# Patient Record
Sex: Female | Born: 1935 | Race: White | Hispanic: No | Smoking: Former smoker
Health system: Southern US, Community
[De-identification: ages and names within clinical notes are randomized; demographics above are authoritative.]

## PROBLEM LIST (undated history)

## (undated) DIAGNOSIS — E049 Nontoxic goiter, unspecified: Secondary | ICD-10-CM

## (undated) DIAGNOSIS — G4700 Insomnia, unspecified: Secondary | ICD-10-CM

## (undated) DIAGNOSIS — F419 Anxiety disorder, unspecified: Secondary | ICD-10-CM

## (undated) DIAGNOSIS — Z86010 Personal history of colonic polyps: Secondary | ICD-10-CM

## (undated) DIAGNOSIS — Z87442 Personal history of urinary calculi: Secondary | ICD-10-CM

## (undated) DIAGNOSIS — N259 Disorder resulting from impaired renal tubular function, unspecified: Secondary | ICD-10-CM

## (undated) DIAGNOSIS — R0609 Other forms of dyspnea: Secondary | ICD-10-CM

## (undated) DIAGNOSIS — E669 Obesity, unspecified: Secondary | ICD-10-CM

## (undated) DIAGNOSIS — N281 Cyst of kidney, acquired: Secondary | ICD-10-CM

## (undated) DIAGNOSIS — J45909 Unspecified asthma, uncomplicated: Secondary | ICD-10-CM

## (undated) DIAGNOSIS — R059 Cough, unspecified: Secondary | ICD-10-CM

## (undated) DIAGNOSIS — E7800 Pure hypercholesterolemia, unspecified: Secondary | ICD-10-CM

## (undated) DIAGNOSIS — R05 Cough: Secondary | ICD-10-CM

## (undated) DIAGNOSIS — I10 Essential (primary) hypertension: Secondary | ICD-10-CM

## (undated) DIAGNOSIS — R0989 Other specified symptoms and signs involving the circulatory and respiratory systems: Secondary | ICD-10-CM

## (undated) DIAGNOSIS — M1990 Unspecified osteoarthritis, unspecified site: Secondary | ICD-10-CM

## (undated) DIAGNOSIS — F329 Major depressive disorder, single episode, unspecified: Secondary | ICD-10-CM

## (undated) DIAGNOSIS — K219 Gastro-esophageal reflux disease without esophagitis: Secondary | ICD-10-CM

## (undated) DIAGNOSIS — E039 Hypothyroidism, unspecified: Secondary | ICD-10-CM

## (undated) HISTORY — DX: Personal history of colonic polyps: Z86.010

## (undated) HISTORY — DX: Unspecified osteoarthritis, unspecified site: M19.90

## (undated) HISTORY — DX: Hypothyroidism, unspecified: E03.9

## (undated) HISTORY — PX: JOINT REPLACEMENT: SHX530

## (undated) HISTORY — PX: CATARACT EXTRACTION: SUR2

## (undated) HISTORY — PX: TOTAL KNEE ARTHROPLASTY: SHX125

## (undated) HISTORY — DX: Unspecified asthma, uncomplicated: J45.909

## (undated) HISTORY — DX: Pure hypercholesterolemia, unspecified: E78.00

## (undated) HISTORY — DX: Insomnia, unspecified: G47.00

## (undated) HISTORY — DX: Disorder resulting from impaired renal tubular function, unspecified: N25.9

## (undated) HISTORY — DX: Essential (primary) hypertension: I10

## (undated) HISTORY — PX: KNEE ARTHROSCOPY: SHX127

## (undated) HISTORY — DX: Gastro-esophageal reflux disease without esophagitis: K21.9

## (undated) HISTORY — DX: Personal history of urinary calculi: Z87.442

## (undated) HISTORY — PX: CARPAL TUNNEL RELEASE: SHX101

## (undated) HISTORY — DX: Cyst of kidney, acquired: N28.1

## (undated) HISTORY — PX: LITHOTRIPSY: SUR834

## (undated) HISTORY — DX: Nontoxic goiter, unspecified: E04.9

## (undated) HISTORY — PX: TONSILLECTOMY: SHX5217

## (undated) HISTORY — DX: Other forms of dyspnea: R06.09

## (undated) HISTORY — DX: Other specified symptoms and signs involving the circulatory and respiratory systems: R09.89

## (undated) HISTORY — DX: Obesity, unspecified: E66.9

## (undated) HISTORY — PX: ABDOMINAL HYSTERECTOMY: SHX81

---

## 1988-12-19 HISTORY — PX: NASAL SINUS SURGERY: SHX719

## 1999-05-31 ENCOUNTER — Ambulatory Visit (HOSPITAL_COMMUNITY): Admission: RE | Admit: 1999-05-31 | Discharge: 1999-05-31 | Payer: Self-pay | Admitting: Gastroenterology

## 2000-09-20 ENCOUNTER — Other Ambulatory Visit: Admission: RE | Admit: 2000-09-20 | Discharge: 2000-09-20 | Payer: Self-pay | Admitting: Internal Medicine

## 2001-04-25 ENCOUNTER — Ambulatory Visit (HOSPITAL_BASED_OUTPATIENT_CLINIC_OR_DEPARTMENT_OTHER): Admission: RE | Admit: 2001-04-25 | Discharge: 2001-04-25 | Payer: Self-pay | Admitting: *Deleted

## 2004-07-26 ENCOUNTER — Inpatient Hospital Stay (HOSPITAL_COMMUNITY): Admission: RE | Admit: 2004-07-26 | Discharge: 2004-07-30 | Payer: Self-pay | Admitting: Orthopedic Surgery

## 2004-07-30 ENCOUNTER — Inpatient Hospital Stay (HOSPITAL_COMMUNITY)
Admission: RE | Admit: 2004-07-30 | Discharge: 2004-08-04 | Payer: Self-pay | Admitting: Physical Medicine & Rehabilitation

## 2004-10-14 ENCOUNTER — Encounter: Payer: Self-pay | Admitting: Internal Medicine

## 2004-10-14 ENCOUNTER — Ambulatory Visit (HOSPITAL_COMMUNITY): Admission: RE | Admit: 2004-10-14 | Discharge: 2004-10-14 | Payer: Self-pay | Admitting: Gastroenterology

## 2004-10-14 ENCOUNTER — Encounter (INDEPENDENT_AMBULATORY_CARE_PROVIDER_SITE_OTHER): Payer: Self-pay | Admitting: *Deleted

## 2004-11-23 ENCOUNTER — Ambulatory Visit: Payer: Self-pay | Admitting: Internal Medicine

## 2005-02-10 ENCOUNTER — Ambulatory Visit: Payer: Self-pay | Admitting: Internal Medicine

## 2005-03-09 ENCOUNTER — Ambulatory Visit (HOSPITAL_BASED_OUTPATIENT_CLINIC_OR_DEPARTMENT_OTHER): Admission: RE | Admit: 2005-03-09 | Discharge: 2005-03-09 | Payer: Self-pay | Admitting: Orthopedic Surgery

## 2005-03-09 ENCOUNTER — Ambulatory Visit (HOSPITAL_COMMUNITY): Admission: RE | Admit: 2005-03-09 | Discharge: 2005-03-09 | Payer: Self-pay | Admitting: Orthopedic Surgery

## 2005-09-21 ENCOUNTER — Ambulatory Visit: Payer: Self-pay | Admitting: Internal Medicine

## 2005-09-28 ENCOUNTER — Ambulatory Visit: Payer: Self-pay | Admitting: Internal Medicine

## 2006-06-30 ENCOUNTER — Encounter: Payer: Self-pay | Admitting: Internal Medicine

## 2006-10-27 ENCOUNTER — Ambulatory Visit: Payer: Self-pay | Admitting: Internal Medicine

## 2006-10-27 LAB — CONVERTED CEMR LAB
ALT: 34 units/L (ref 0–40)
AST: 34 units/L (ref 0–37)
Basophils Absolute: 0 10*3/uL (ref 0.0–0.1)
Calcium: 9 mg/dL (ref 8.4–10.5)
Chloride: 107 meq/L (ref 96–112)
Chol/HDL Ratio, serum: 5.3
Glomerular Filtration Rate, Af Am: 63 mL/min/{1.73_m2}
HDL: 47.2 mg/dL (ref >39.0)
LDL DIRECT: 182.3 mg/dL
Lymphocytes Relative: 31.3 % (ref 12.0–46.0)
MCHC: 34.3 g/dL (ref 30.0–36.0)
MCV: 88.9 fL (ref 78.0–100.0)
Monocytes Absolute: 0.4 10*3/uL (ref 0.2–0.7)
Monocytes Relative: 7.6 % (ref 3.0–11.0)
Neutrophils Relative %: 55.1 % (ref 43.0–77.0)
Sodium: 142 meq/L (ref 135–145)
TSH: 3.12 microintl units/mL (ref 0.35–5.50)
Total Bilirubin: 0.4 mg/dL (ref 0.3–1.2)
WBC: 5.3 10*3/uL (ref 4.5–10.5)

## 2006-11-03 ENCOUNTER — Ambulatory Visit: Payer: Self-pay | Admitting: Internal Medicine

## 2007-02-26 ENCOUNTER — Ambulatory Visit: Payer: Self-pay | Admitting: Internal Medicine

## 2007-06-18 DIAGNOSIS — I10 Essential (primary) hypertension: Secondary | ICD-10-CM

## 2007-06-18 DIAGNOSIS — K219 Gastro-esophageal reflux disease without esophagitis: Secondary | ICD-10-CM

## 2007-06-18 DIAGNOSIS — E039 Hypothyroidism, unspecified: Secondary | ICD-10-CM | POA: Insufficient documentation

## 2007-06-18 DIAGNOSIS — J45909 Unspecified asthma, uncomplicated: Secondary | ICD-10-CM | POA: Insufficient documentation

## 2007-06-18 HISTORY — DX: Hypothyroidism, unspecified: E03.9

## 2007-06-18 HISTORY — DX: Gastro-esophageal reflux disease without esophagitis: K21.9

## 2007-06-18 HISTORY — DX: Essential (primary) hypertension: I10

## 2007-07-02 ENCOUNTER — Encounter: Payer: Self-pay | Admitting: Internal Medicine

## 2007-07-09 ENCOUNTER — Ambulatory Visit: Payer: Self-pay | Admitting: Internal Medicine

## 2007-07-09 DIAGNOSIS — E7800 Pure hypercholesterolemia, unspecified: Secondary | ICD-10-CM | POA: Insufficient documentation

## 2007-07-09 HISTORY — DX: Pure hypercholesterolemia, unspecified: E78.00

## 2007-07-09 LAB — CONVERTED CEMR LAB
Cholesterol: 237 mg/dL (ref 0–200)
Total CHOL/HDL Ratio: 5.9
VLDL: 44 mg/dL — ABNORMAL HIGH (ref 0–40)

## 2007-07-23 ENCOUNTER — Ambulatory Visit: Payer: Self-pay | Admitting: Internal Medicine

## 2007-10-18 ENCOUNTER — Ambulatory Visit: Payer: Self-pay | Admitting: Internal Medicine

## 2007-10-18 LAB — CONVERTED CEMR LAB
AST: 19 units/L (ref 0–37)
BUN: 30 mg/dL — ABNORMAL HIGH (ref 6–23)
Basophils Absolute: 0 10*3/uL (ref 0.0–0.1)
Basophils Relative: 0.4 % (ref 0.0–1.0)
CO2: 27 meq/L (ref 19–32)
Cholesterol: 166 mg/dL (ref 0–200)
Creatinine, Ser: 2 mg/dL — ABNORMAL HIGH (ref 0.4–1.2)
Eosinophils Relative: 6.9 % — ABNORMAL HIGH (ref 0.0–5.0)
GFR calc non Af Amer: 26 mL/min
Glucose, Bld: 99 mg/dL (ref 70–99)
HCT: 34.3 % — ABNORMAL LOW (ref 36.0–46.0)
HDL: 42 mg/dL (ref >39.0)
LDL Cholesterol: 88 mg/dL (ref 0–99)
Monocytes Absolute: 0.5 10*3/uL (ref 0.2–0.7)
Neutrophils Relative %: 60.5 % (ref 43.0–77.0)
Platelets: 273 10*3/uL (ref 150–400)
RBC: 3.91 M/uL (ref 3.87–5.11)
RDW: 13.5 % (ref 11.5–14.6)
Total Bilirubin: 0.6 mg/dL (ref 0.3–1.2)
Triglycerides: 178 mg/dL — ABNORMAL HIGH (ref 0–149)

## 2007-10-25 ENCOUNTER — Ambulatory Visit: Payer: Self-pay | Admitting: Internal Medicine

## 2007-10-29 ENCOUNTER — Encounter: Payer: Self-pay | Admitting: Internal Medicine

## 2007-10-29 ENCOUNTER — Ambulatory Visit: Payer: Self-pay | Admitting: Internal Medicine

## 2008-02-07 ENCOUNTER — Ambulatory Visit: Payer: Self-pay | Admitting: Internal Medicine

## 2008-02-08 LAB — CONVERTED CEMR LAB
BUN: 25 mg/dL — ABNORMAL HIGH (ref 6–23)
Calcium: 9.9 mg/dL (ref 8.4–10.5)
Creatinine, Ser: 1.9 mg/dL — ABNORMAL HIGH (ref 0.4–1.2)
GFR calc Af Amer: 34 mL/min
Glucose, Bld: 99 mg/dL (ref 70–99)
Potassium: 5.2 meq/L — ABNORMAL HIGH (ref 3.5–5.1)

## 2008-02-14 ENCOUNTER — Telehealth: Payer: Self-pay | Admitting: Internal Medicine

## 2008-02-15 ENCOUNTER — Ambulatory Visit: Payer: Self-pay | Admitting: Internal Medicine

## 2008-02-15 DIAGNOSIS — H60399 Other infective otitis externa, unspecified ear: Secondary | ICD-10-CM | POA: Insufficient documentation

## 2008-05-02 ENCOUNTER — Telehealth: Payer: Self-pay | Admitting: Internal Medicine

## 2008-05-06 ENCOUNTER — Ambulatory Visit: Payer: Self-pay | Admitting: Internal Medicine

## 2008-05-06 DIAGNOSIS — N259 Disorder resulting from impaired renal tubular function, unspecified: Secondary | ICD-10-CM

## 2008-05-06 DIAGNOSIS — R0609 Other forms of dyspnea: Secondary | ICD-10-CM | POA: Insufficient documentation

## 2008-05-06 DIAGNOSIS — R0600 Dyspnea, unspecified: Secondary | ICD-10-CM | POA: Insufficient documentation

## 2008-05-06 DIAGNOSIS — R0989 Other specified symptoms and signs involving the circulatory and respiratory systems: Secondary | ICD-10-CM

## 2008-05-06 HISTORY — DX: Disorder resulting from impaired renal tubular function, unspecified: N25.9

## 2008-05-06 HISTORY — DX: Other forms of dyspnea: R06.09

## 2008-05-06 HISTORY — DX: Other specified symptoms and signs involving the circulatory and respiratory systems: R09.89

## 2008-05-07 ENCOUNTER — Encounter: Payer: Self-pay | Admitting: Internal Medicine

## 2008-05-07 ENCOUNTER — Ambulatory Visit: Payer: Self-pay

## 2008-05-07 DIAGNOSIS — M1990 Unspecified osteoarthritis, unspecified site: Secondary | ICD-10-CM

## 2008-05-07 HISTORY — DX: Unspecified osteoarthritis, unspecified site: M19.90

## 2008-05-29 LAB — CONVERTED CEMR LAB
CO2: 27 meq/L (ref 19–32)
Calcium: 9.8 mg/dL (ref 8.4–10.5)
Chloride: 107 meq/L (ref 96–112)
Potassium: 4.4 meq/L (ref 3.5–5.1)
Sodium: 140 meq/L (ref 135–145)

## 2008-06-26 ENCOUNTER — Encounter: Payer: Self-pay | Admitting: Internal Medicine

## 2008-07-02 ENCOUNTER — Encounter: Payer: Self-pay | Admitting: Internal Medicine

## 2008-07-03 ENCOUNTER — Encounter: Payer: Self-pay | Admitting: Internal Medicine

## 2008-07-11 ENCOUNTER — Encounter: Payer: Self-pay | Admitting: Internal Medicine

## 2008-07-17 ENCOUNTER — Telehealth: Payer: Self-pay | Admitting: Internal Medicine

## 2008-08-06 ENCOUNTER — Ambulatory Visit: Payer: Self-pay | Admitting: Internal Medicine

## 2008-08-07 ENCOUNTER — Encounter: Payer: Self-pay | Admitting: Internal Medicine

## 2008-08-07 DIAGNOSIS — Z87442 Personal history of urinary calculi: Secondary | ICD-10-CM | POA: Insufficient documentation

## 2008-08-07 HISTORY — DX: Personal history of urinary calculi: Z87.442

## 2008-08-12 ENCOUNTER — Encounter: Payer: Self-pay | Admitting: Internal Medicine

## 2008-08-22 ENCOUNTER — Encounter (INDEPENDENT_AMBULATORY_CARE_PROVIDER_SITE_OTHER): Payer: Self-pay | Admitting: Urology

## 2008-08-22 ENCOUNTER — Ambulatory Visit (HOSPITAL_BASED_OUTPATIENT_CLINIC_OR_DEPARTMENT_OTHER): Admission: RE | Admit: 2008-08-22 | Discharge: 2008-08-22 | Payer: Self-pay | Admitting: Urology

## 2008-09-01 ENCOUNTER — Encounter: Payer: Self-pay | Admitting: Internal Medicine

## 2008-09-11 ENCOUNTER — Ambulatory Visit (HOSPITAL_COMMUNITY): Admission: RE | Admit: 2008-09-11 | Discharge: 2008-09-11 | Payer: Self-pay | Admitting: Urology

## 2008-09-19 ENCOUNTER — Ambulatory Visit: Payer: Self-pay | Admitting: Internal Medicine

## 2008-10-20 ENCOUNTER — Ambulatory Visit: Payer: Self-pay | Admitting: Internal Medicine

## 2008-10-20 LAB — CONVERTED CEMR LAB
ALT: 15 units/L (ref 0–35)
BUN: 31 mg/dL — ABNORMAL HIGH (ref 6–23)
Basophils Absolute: 0 10*3/uL (ref 0.0–0.1)
Basophils Relative: 0.3 % (ref 0.0–3.0)
Calcium: 9.3 mg/dL (ref 8.4–10.5)
Chloride: 108 meq/L (ref 96–112)
Cholesterol: 172 mg/dL (ref 0–200)
Creatinine, Ser: 2.1 mg/dL — ABNORMAL HIGH (ref 0.4–1.2)
Eosinophils Absolute: 0.3 10*3/uL (ref 0.0–0.7)
HCT: 34.9 % — ABNORMAL LOW (ref 36.0–46.0)
HDL: 48.4 mg/dL (ref >39.0)
LDL Cholesterol: 87 mg/dL (ref 0–99)
MCHC: 34.4 g/dL (ref 30.0–36.0)
MCV: 85.2 fL (ref 78.0–100.0)
Monocytes Absolute: 0.3 10*3/uL (ref 0.1–1.0)
Monocytes Relative: 6.1 % (ref 3.0–12.0)
Neutro Abs: 3.2 10*3/uL (ref 1.4–7.7)
Neutrophils Relative %: 55.3 % (ref 43.0–77.0)
RDW: 13.6 % (ref 11.5–14.6)

## 2008-10-27 ENCOUNTER — Ambulatory Visit: Payer: Self-pay | Admitting: Internal Medicine

## 2008-11-04 ENCOUNTER — Encounter: Payer: Self-pay | Admitting: Internal Medicine

## 2009-01-29 ENCOUNTER — Encounter: Payer: Self-pay | Admitting: Internal Medicine

## 2009-02-23 ENCOUNTER — Encounter: Payer: Self-pay | Admitting: Internal Medicine

## 2009-04-28 ENCOUNTER — Ambulatory Visit: Payer: Self-pay | Admitting: Internal Medicine

## 2009-07-06 ENCOUNTER — Encounter: Payer: Self-pay | Admitting: Internal Medicine

## 2009-07-24 ENCOUNTER — Encounter: Payer: Self-pay | Admitting: Internal Medicine

## 2009-09-23 ENCOUNTER — Telehealth: Payer: Self-pay | Admitting: Internal Medicine

## 2009-10-05 ENCOUNTER — Encounter: Payer: Self-pay | Admitting: Internal Medicine

## 2009-10-30 ENCOUNTER — Ambulatory Visit: Payer: Self-pay | Admitting: Internal Medicine

## 2009-10-30 LAB — CONVERTED CEMR LAB
AST: 20 units/L (ref 0–37)
Albumin: 3.3 g/dL — ABNORMAL LOW (ref 3.5–5.2)
Alkaline Phosphatase: 96 units/L (ref 39–117)
BUN: 28 mg/dL — ABNORMAL HIGH (ref 6–23)
Basophils Absolute: 0 10*3/uL (ref 0.0–0.1)
Basophils Relative: 0.7 % (ref 0.0–3.0)
Calcium: 9.2 mg/dL (ref 8.4–10.5)
Cholesterol: 176 mg/dL (ref 0–200)
Glucose, Bld: 96 mg/dL (ref 70–99)
HDL: 47.9 mg/dL (ref >39.00)
Lymphs Abs: 1.8 10*3/uL (ref 0.7–4.0)
MCV: 88.1 fL (ref 78.0–100.0)
Neutro Abs: 2.7 10*3/uL (ref 1.4–7.7)
RBC: 3.99 M/uL (ref 3.87–5.11)
Sodium: 142 meq/L (ref 135–145)
TSH: 0.3 microintl units/mL — ABNORMAL LOW (ref 0.35–5.50)
Total Bilirubin: 0.7 mg/dL (ref 0.3–1.2)
Total Protein: 5.9 g/dL — ABNORMAL LOW (ref 6.0–8.3)
Triglycerides: 197 mg/dL — ABNORMAL HIGH (ref 0.0–149.0)
VLDL: 39.4 mg/dL (ref 0.0–40.0)
WBC: 5.1 10*3/uL (ref 4.5–10.5)

## 2009-11-06 ENCOUNTER — Ambulatory Visit: Payer: Self-pay | Admitting: Internal Medicine

## 2009-11-06 DIAGNOSIS — Z860100 Personal history of colon polyps, unspecified: Secondary | ICD-10-CM | POA: Insufficient documentation

## 2009-11-06 HISTORY — DX: Personal history of colonic polyps: Z86.010

## 2009-11-06 HISTORY — DX: Personal history of colon polyps, unspecified: Z86.0100

## 2010-02-02 ENCOUNTER — Encounter: Payer: Self-pay | Admitting: Internal Medicine

## 2010-03-17 ENCOUNTER — Inpatient Hospital Stay (HOSPITAL_COMMUNITY): Admission: RE | Admit: 2010-03-17 | Discharge: 2010-03-22 | Payer: Self-pay | Admitting: Orthopedic Surgery

## 2010-05-06 ENCOUNTER — Ambulatory Visit: Payer: Self-pay | Admitting: Internal Medicine

## 2010-06-10 ENCOUNTER — Telehealth: Payer: Self-pay | Admitting: Internal Medicine

## 2010-07-07 ENCOUNTER — Encounter: Payer: Self-pay | Admitting: Internal Medicine

## 2010-09-13 ENCOUNTER — Telehealth: Payer: Self-pay | Admitting: Internal Medicine

## 2010-11-01 ENCOUNTER — Ambulatory Visit: Payer: Self-pay | Admitting: Internal Medicine

## 2010-11-01 LAB — CONVERTED CEMR LAB
ALT: 15 units/L (ref 0–35)
AST: 19 units/L (ref 0–37)
Albumin: 3.4 g/dL — ABNORMAL LOW (ref 3.5–5.2)
Basophils Absolute: 0 10*3/uL (ref 0.0–0.1)
Bilirubin, Direct: 0.1 mg/dL (ref 0.0–0.3)
Calcium: 9.4 mg/dL (ref 8.4–10.5)
Creatinine, Ser: 1.8 mg/dL — ABNORMAL HIGH (ref 0.4–1.2)
Eosinophils Absolute: 0.2 10*3/uL (ref 0.0–0.7)
Eosinophils Relative: 3.7 % (ref 0.0–5.0)
GFR calc non Af Amer: 29.31 mL/min (ref >60)
HCT: 33.1 % — ABNORMAL LOW (ref 36.0–46.0)
Hemoglobin: 11.1 g/dL — ABNORMAL LOW (ref 12.0–15.0)
LDL Cholesterol: 90 mg/dL (ref 0–99)
Lymphocytes Relative: 31 % (ref 12.0–46.0)
MCV: 83.9 fL (ref 78.0–100.0)
Monocytes Absolute: 0.4 10*3/uL (ref 0.1–1.0)
Monocytes Relative: 6.3 % (ref 3.0–12.0)
Neutrophils Relative %: 58.7 % (ref 43.0–77.0)
Potassium: 4.3 meq/L (ref 3.5–5.1)
RBC: 3.94 M/uL (ref 3.87–5.11)
Triglycerides: 190 mg/dL — ABNORMAL HIGH (ref 0.0–149.0)
WBC: 6 10*3/uL (ref 4.5–10.5)

## 2010-11-08 ENCOUNTER — Encounter: Payer: Self-pay | Admitting: Internal Medicine

## 2010-11-08 ENCOUNTER — Ambulatory Visit: Payer: Self-pay | Admitting: Internal Medicine

## 2010-12-07 ENCOUNTER — Telehealth: Payer: Self-pay | Admitting: Internal Medicine

## 2011-01-18 NOTE — Progress Notes (Signed)
Summary: refill zolpidem  Phone Note Refill Request Message from:  Patient---LIVE CALL  Refills Requested: Medication #1:  ZOLPIDEM TARTRATE 5 MG TABS one at bedtime as needed for sleep SEND TO [facility]  Initial call taken by: Despina Arias,  September 13, 2010 3:49 PM    Prescriptions: ZOLPIDEM TARTRATE 5 MG TABS (ZOLPIDEM TARTRATE) one at bedtime as needed for sleep  #30 x 2   Entered by:   Cay Schillings LPN   Authorized by:   Marletta Lor  MD   Signed by:   Cay Schillings LPN on [id number]   Method used:   Historical   [id number]  called to [facility]. KIK

## 2011-01-18 NOTE — Letter (Signed)
Summary: [facility]   Imported By: Laural Benes 03/09/2010 10:40:53  _____________________________________________________________________  External Attachment:    Type:   Image     Comment:   External Document

## 2011-01-18 NOTE — Assessment & Plan Note (Signed)
Summary: cpx//ccm   Vital Signs:  Patient profile:   76 year old female Height:      65 inches Weight:      243 pounds BMI:     40.58 Temp:     98.5 degrees F oral BP sitting:   110 / 74  (right arm) Cuff size:   regular  Vitals Entered By: Cay Schillings LPN (November 21, 624THL 2:14 PM) CC: cpx- doing well  Is Patient Diabetic? No   CC:  cpx- doing well .  History of Present Illness: 76 year old patient who is seen today for a preventative health examination per medical problems include chronic kidney disease, asthma, treated hypertension, and obesity.  She also is a history of colonic polyps.  Here for Medicare AWV:  1.   Risk factors based on Past M, S, F history:current S. risk factors include hypertension and dyslipidemia. 2.   Physical Activities: fairly sedentary has a history of arthritis, and exogenous obesity 3.   Depression/mood: history depression, or mood disorder 4.   Hearing: no hearing deficits 5.   ADL's: independent in all aspects of daily living 6.   Fall Risk: low 7.   Home Safety: no problems identified 8.   Height, weight, &visual acuity:height and weight stable.  No change in visual acuity 9.   Counseling: heart healthy diet, exercise and weight loss.  All encouraged 10.   Labs ordered based on risk factors: laboratory profile, including lipid panel reviewed 11.           Referral Coordination- not appropriate at this time except for nephrology 12.           Care Plan- heart healthy diet, exercise and weight loss.  All encouraged 13.            Cognitive Assessment- alert and oriented, with normal affect.  No history of memory dysfunction.  Continues to have all executive functions without difficulty   Allergies (verified): No Known Drug Allergies  Past History:  Past Medical History: Reviewed history from 11/06/2009 and no changes required. Asthma GERD Hypertension Hypothyroidism Goiter Benign tumor obesity insomnia   chronic kidney  disease Osteoarthritis Nephrolithiasis, hx of renal cysts Colonic polyps, hx of  Past Surgical History: Colonoscopy-10/14/2004 Hysterectomy with bladder tack, age 79 Tonsillectomy Bilat Cataracts Knee scope Percell Miller) history carpal tunnel release status post lithotripsy, fall 2009 s/p R TKR 03-2010  colonoscopy October 2005, Oct 2010  Family History: Reviewed history from 10/27/2008 and no changes required. father died age 57 MI.  History prostate cancer mother died age 29 one brother h/o of a stroke at age 53 due to a cerebral. aneurysm one sister is well;  family history positive for hypertension, colon cancer, prostate cancer  Social History: Reviewed history from 11/06/2009 and no changes required. Married Drug use-no Never Smoked Regular exercise-yes  Review of Systems       The patient complains of weight gain.  The patient denies anorexia, fever, weight loss, vision loss, decreased hearing, hoarseness, chest pain, syncope, dyspnea on exertion, peripheral edema, prolonged cough, headaches, hemoptysis, abdominal pain, melena, hematochezia, severe indigestion/heartburn, hematuria, incontinence, genital sores, muscle weakness, suspicious skin lesions, transient blindness, difficulty walking, depression, unusual weight change, abnormal bleeding, enlarged lymph nodes, angioedema, and breast masses.    Physical Exam  General:  overweight-appearing.  110/70overweight-appearing.   Head:  Normocephalic and atraumatic without obvious abnormalities. No apparent alopecia or balding. Eyes:  No corneal or conjunctival inflammation noted. EOMI. Perrla. Funduscopic exam benign, without hemorrhages,  exudates or papilledema. Vision grossly normal. Ears:  External ear exam shows no significant lesions or deformities.  Otoscopic examination reveals clear canals, tympanic membranes are intact bilaterally without bulging, retraction, inflammation or discharge. Hearing is grossly normal  bilaterally. Nose:  External nasal examination shows no deformity or inflammation. Nasal mucosa are pink and moist without lesions or exudates. Mouth:  Oral mucosa and oropharynx without lesions or exudates.  Teeth in good repair. Neck:  No deformities, masses, or tenderness noted. Chest Wall:  No deformities, masses, or tenderness noted. Breasts:  No mass, nodules, thickening, tenderness, bulging, retraction, inflamation, nipple discharge or skin changes noted.   Lungs:  Normal respiratory effort, chest expands symmetrically. Lungs are clear to auscultation, no crackles or wheezes. Heart:  Normal rate and regular rhythm. S1 and S2 normal without gallop, murmur, click, rub or other extra sounds. Abdomen:  Bowel sounds positive,abdomen soft and non-tender without masses, organomegaly or hernias noted. Msk:  No deformity or scoliosis noted of thoracic or lumbar spine.   Pulses:  R and L carotid,radial,femoral,dorsalis pedis and posterior tibial pulses are full and equal bilaterally Extremities:  No clubbing, cyanosis, edema, or deformity noted with normal full range of motion of all joints.   Neurologic:  No cranial nerve deficits noted. Station and gait are normal. Plantar reflexes are down-going bilaterally. DTRs are symmetrical throughout. Sensory, motor and coordinative functions appear intact. Skin:  Intact without suspicious lesions or rashes Cervical Nodes:  No lymphadenopathy noted Axillary Nodes:  No palpable lymphadenopathy Inguinal Nodes:  No significant adenopathy Psych:  Cognition and judgment appear intact. Alert and cooperative with normal attention span and concentration. No apparent delusions, illusions, hallucinations   Impression & Recommendations:  Problem # 1:  Preventive Health Care (ICD-V70.0)  Orders: Medicare -1st Annual Wellness Visit [phone])  Complete Medication List: 1)  Benazepril Hcl 40 Mg Tabs (Benazepril hcl) .... One daily 2)  Singulair 10 Mg Tabs  (Montelukast sodium) .[name] Kitchen.. 1 qd 3)  Synthroid 75 Mcg Tabs (Levothyroxine sodium) .[name] Kitchen.. 1 qd 4)  Norvasc 5 Mg Tabs (Amlodipine besylate) .[name] Kitchen.. 1 qd 5)  Qvar 80 Mcg/act Aers (Beclomethasone dipropionate) .... Use prn 6)  Hydrochlorothiazide 25 Mg Tabs (Hydrochlorothiazide) .... Take one (1) by mouth daily 7)  Simvastatin 20 Mg Tabs (Simvastatin) .... One daily 8)  Tramadol Hcl 50 Mg Tabs (Tramadol hcl) .[name] Kitchen.. 1 q6h as needed 9)  Vesicare 5 Mg Tabs (Solifenacin succinate) .[name] Kitchen.. 1 once daily 10)  Zolpidem Tartrate 5 Mg Tabs (Zolpidem tartrate) .... One at bedtime as needed for sleep 11)  Nexium 40 Mg Cpdr (Esomeprazole magnesium) .[name] Kitchen.. 1 once daily  Other Orders: EKG w/ Interpretation (93000)  Patient Instructions: 1)  Please schedule a follow-up appointment in 6 months. 2)  Limit your Sodium (Salt). 3)  It is important that you exercise regularly at least 20 minutes 5 times a week. If you develop chest pain, have severe difficulty breathing, or feel very tired , stop exercising immediately and seek medical attention. 4)  You need to lose weight. Consider a lower calorie diet and regular exercise.  5)  Take calcium +Vitamin D daily. Prescriptions: NEXIUM 40 MG CPDR (ESOMEPRAZOLE MAGNESIUM) 1 once daily  #90 x 3   Entered and Authorized by:   Marletta Lor  MD   Signed by:   Marletta Lor  MD on 11/08/2010   Method used:   Print then Give to Patient   RxID:   [id number] VESICARE 5 MG TABS (SOLIFENACIN SUCCINATE) 1 once  daily  #90 x 6   Entered and Authorized by:   Marletta Lor  MD   Signed by:   Marletta Lor  MD on 11/08/2010   Method used:   Print then Give to Patient   RxID:   [id number] TRAMADOL HCL 50 MG  TABS (TRAMADOL HCL) 1 q6h as needed  #120 x 6   Entered and Authorized by:   Marletta Lor  MD   Signed by:   Marletta Lor  MD on 11/08/2010   Method used:   Print then Give to Patient   RxID:   [id number] SIMVASTATIN 20 MG  TABS  (SIMVASTATIN) one daily  #90 Tablet x 6   Entered and Authorized by:   Marletta Lor  MD   Signed by:   Marletta Lor  MD on 11/08/2010   Method used:   Print then Give to Patient   RxID:   [id number] HYDROCHLOROTHIAZIDE 25 MG TABS (HYDROCHLOROTHIAZIDE) Take one (1) by mouth daily  #90 Tablet x 6   Entered and Authorized by:   Marletta Lor  MD   Signed by:   Marletta Lor  MD on 11/08/2010   Method used:   Print then Give to Patient   RxID:   [id number] 80 MCG/ACT  AERS (BECLOMETHASONE DIPROPIONATE) use prn  #3 x 6   Entered and Authorized by:   Marletta Lor  MD   Signed by:   Marletta Lor  MD on 11/08/2010   Method used:   Print then Give to Patient   RxID:   [id number] [place] 5 MG  TABS (AMLODIPINE BESYLATE) 1 qd  #90 Tablet x 6   Entered and Authorized by:   Marletta Lor  MD   Signed by:   Marletta Lor  MD on 11/08/2010   Method used:   Print then Give to Patient   RxID:   [id number] SYNTHROID 75 MCG  TABS (LEVOTHYROXINE SODIUM) 1 qd  #90 Tablet x 6   Entered and Authorized by:   Marletta Lor  MD   Signed by:   Marletta Lor  MD on 11/08/2010   Method used:   Print then Give to Patient   RxID:   [id number] SINGULAIR 10 MG  TABS (MONTELUKAST SODIUM) 1 qd  #90 Tablet x 6   Entered and Authorized by:   Marletta Lor  MD   Signed by:   Marletta Lor  MD on 11/08/2010   Method used:   Print then Give to Patient   RxID:   [id number] BENAZEPRIL HCL 40 MG  TABS (BENAZEPRIL HCL) one daily  #90 Tablet x 6   Entered and Authorized by:   Marletta Lor  MD   Signed by:   Marletta Lor  MD on 11/08/2010   Method used:   Print then Give to Patient   RxID:   [id number]    Orders Added: 1)  EKG w/ Interpretation [93000] 2)  Medicare -1st Annual Wellness Visit [id number] 3)  Est. Patient Level III [id number]   Immunization History:  Influenza Immunization  History:    Influenza:  Historical (10/02/2010)  Pneumovax Immunization History:    Pneumovax:  Historical (09/18/2004)  Zostavax History:    Zostavax # 1:  Zostavax (05/06/2008)   Immunization History:  Influenza Immunization History:    Influenza:  Historical (10/02/2010) given at work per pt. Orpha Bur

## 2011-01-18 NOTE — Progress Notes (Signed)
Summary: refill ambien  Phone Note Refill Request Message from:  Fax from Pharmacy on June 10, 2010 10:54 AM  Refills Requested: Medication #1:  ZOLPIDEM TARTRATE 5 MG TABS one at bedtime as needed for sleep   Last Refilled: 05/12/2010 [facility] - [id number]   Method Requested: Telephone to Pharmacy Initial call taken by: Cay Schillings LPN,  June 23, 624THL [id number] AM    Prescriptions: ZOLPIDEM TARTRATE 5 MG TABS (ZOLPIDEM TARTRATE) one at bedtime as needed for sleep  #30 x 2   Entered by:   Cay Schillings LPN   Authorized by:   Marletta Lor  MD   Signed by:   Cay Schillings LPN on [id number]   Method used:   Historical   [id number]

## 2011-01-18 NOTE — Assessment & Plan Note (Signed)
Summary: 6 MONTH ROV/NJR   Vital Signs:  Patient profile:   76 year old female Weight:      238 pounds Temp:     98.0 degrees F oral BP sitting:   118 / 70  (left arm) Cuff size:   regular  Vitals Entered By: Cay Schillings LPN (May 19, 624THL 075-GRM PM) CC: 6 mos rov - doing ok - recovering from (R) knee surg Is Patient Diabetic? No   CC:  6 mos rov - doing ok - recovering from (R) knee surg.  History of Present Illness: 76 year old patient who is seen today for follow-up.  She is followed by renal medicine due to chronic kidney disease.  She has treated hypertension, history of asthma and allergic rhinitis.  She has osteoarthritis and is status post recent knee replacement surgery two months ago.  She has done very well.  Postop  Allergies (verified): No Known Drug Allergies  Past History:  Past Medical History: Reviewed history from 11/06/2009 and no changes required. Asthma GERD Hypertension Hypothyroidism Goiter Benign tumor obesity insomnia   chronic kidney disease Osteoarthritis Nephrolithiasis, hx of renal cysts Colonic polyps, hx of  Review of Systems       The patient complains of weight gain.  The patient denies anorexia, fever, weight loss, vision loss, decreased hearing, hoarseness, chest pain, syncope, dyspnea on exertion, peripheral edema, prolonged cough, headaches, hemoptysis, abdominal pain, melena, hematochezia, severe indigestion/heartburn, hematuria, incontinence, genital sores, muscle weakness, suspicious skin lesions, transient blindness, difficulty walking, depression, unusual weight change, abnormal bleeding, enlarged lymph nodes, angioedema, and breast masses.    Physical Exam  General:  overweight-appearing.  normal blood pressureoverweight-appearing.   Head:  Normocephalic and atraumatic without obvious abnormalities. No apparent alopecia or balding. Eyes:  No corneal or conjunctival inflammation noted. EOMI. Perrla. Funduscopic exam  benign, without hemorrhages, exudates or papilledema. Vision grossly normal. Mouth:  Oral mucosa and oropharynx without lesions or exudates.  Teeth in good repair. Neck:  No deformities, masses, or tenderness noted. Lungs:  Normal respiratory effort, chest expands symmetrically. Lungs are clear to auscultation, no crackles or wheezes. Heart:  Normal rate and regular rhythm. S1 and S2 normal without gallop, murmur, click, rub or other extra sounds. Abdomen:  Bowel sounds positive,abdomen soft and non-tender without masses, organomegaly or hernias noted. Msk:  No deformity or scoliosis noted of thoracic or lumbar spine.     Impression & Recommendations:  Problem # 1:  OSTEOARTHRITIS (ICD-715.90)  Her updated medication list for this problem includes:    Tramadol Hcl 50 Mg Tabs (Tramadol hcl) .[name] Kitchen... 1 q6h as needed  Her updated medication list for this problem includes:    Tramadol Hcl 50 Mg Tabs (Tramadol hcl) .[name] Kitchen... 1 q6h as needed  Problem # 2:  RENAL INSUFFICIENCY (ICD-588.9)  Problem # 3:  HYPERTENSION (ICD-401.9)  Her updated medication list for this problem includes:    Benazepril Hcl 40 Mg Tabs (Benazepril hcl) ..... One daily    Norvasc 5 Mg Tabs (Amlodipine besylate) .[name] Kitchen... 1 qd    Hydrochlorothiazide 25 Mg Tabs (Hydrochlorothiazide) .[name] Kitchen... Take one (1) by mouth daily  Her updated medication list for this problem includes:    Benazepril Hcl 40 Mg Tabs (Benazepril hcl) ..... One daily    Norvasc 5 Mg Tabs (Amlodipine besylate) .[name] Kitchen... 1 qd    Hydrochlorothiazide 25 Mg Tabs (Hydrochlorothiazide) .[name] Kitchen... Take one (1) by mouth daily  Complete Medication List: 1)  Benazepril Hcl 40 Mg Tabs (Benazepril hcl) .... One daily 2)  Singulair 10 Mg Tabs (Montelukast sodium) .[name] Kitchen.. 1 qd 3)  Synthroid 75 Mcg Tabs (Levothyroxine sodium) .[name] Kitchen.. 1 qd 4)  Norvasc 5 Mg Tabs (Amlodipine besylate) .[name] Kitchen.. 1 qd 5)  Qvar 80 Mcg/act Aers (Beclomethasone dipropionate) .... Use prn 6)  Hydrochlorothiazide 25 Mg  Tabs (Hydrochlorothiazide) .... Take one (1) by mouth daily 7)  Simvastatin 20 Mg Tabs (Simvastatin) .... One daily 8)  Tramadol Hcl 50 Mg Tabs (Tramadol hcl) .[name] Kitchen.. 1 q6h as needed 9)  Vesicare 5 Mg Tabs (Solifenacin succinate) .[name] Kitchen.. 1 once daily 10)  Zolpidem Tartrate 5 Mg Tabs (Zolpidem tartrate) .... One at bedtime as needed for sleep 11)  Nexium 40 Mg Cpdr (Esomeprazole magnesium) .[name] Kitchen.. 1 once daily  Patient Instructions: 1)  Please schedule a follow-up appointment in 6 months. 2)  Advised not to eat any food or drink any liquids after 10 PM the night before your procedure. 3)  Limit your Sodium (Salt) to less than 2 grams a day(slightly less than 1/2 a teaspoon) to prevent fluid retention, swelling, or worsening of symptoms. 4)  It is important that you exercise regularly at least 20 minutes 5 times a week. If you develop chest pain, have severe difficulty breathing, or feel very tired , stop exercising immediately and seek medical attention. 5)  You need to lose weight. Consider a lower calorie diet and regular exercise.  6)  Check your Blood Pressure regularly. If it is above: 140/90  you should make an appointment.

## 2011-01-20 NOTE — Progress Notes (Signed)
Summary: refill zolpidem  Phone Note Refill Request Message from:  Fax from Pharmacy on December 07, 2010 5:10 PM  Refills Requested: Medication #1:  ZOLPIDEM TARTRATE 5 MG TABS one at bedtime as needed for sleep   Last Refilled: 11/05/2010 [facility]   Method Requested: Fax to [facility] Initial call taken by: Cay Schillings LPN,  December 20, 624THL 5:10 PM    Prescriptions: ZOLPIDEM TARTRATE 5 MG TABS (ZOLPIDEM TARTRATE) one at bedtime as needed for sleep  #30 x 2   Entered by:   Cay Schillings LPN   Authorized by:   Marletta Lor  MD   Signed by:   Cay Schillings LPN on [id number]   Method used:   Historical   [id number]  faxed back to [facility]   KIK

## 2011-03-09 LAB — BASIC METABOLIC PANEL
BUN: 16 mg/dL (ref 6–23)
BUN: 19 mg/dL (ref 6–23)
CO2: 26 mEq/L (ref 19–32)
Calcium: 8 mg/dL — ABNORMAL LOW (ref 8.4–10.5)
Calcium: 8.1 mg/dL — ABNORMAL LOW (ref 8.4–10.5)
Chloride: 105 mEq/L (ref 96–112)
Creatinine, Ser: 1.74 mg/dL — ABNORMAL HIGH (ref 0.4–1.2)
Creatinine, Ser: 1.93 mg/dL — ABNORMAL HIGH (ref 0.4–1.2)
Creatinine, Ser: 2.46 mg/dL — ABNORMAL HIGH (ref 0.4–1.2)
GFR calc Af Amer: 31 mL/min — ABNORMAL LOW (ref >60)
GFR calc non Af Amer: 19 mL/min — ABNORMAL LOW (ref >60)
GFR calc non Af Amer: 26 mL/min — ABNORMAL LOW (ref >60)
GFR calc non Af Amer: 29 mL/min — ABNORMAL LOW (ref >60)
Glucose, Bld: 105 mg/dL — ABNORMAL HIGH (ref 70–99)
Glucose, Bld: 122 mg/dL — ABNORMAL HIGH (ref 70–99)
Potassium: 3.5 mEq/L (ref 3.5–5.1)
Sodium: 133 mEq/L — ABNORMAL LOW (ref 135–145)

## 2011-03-09 LAB — HEMOGLOBIN AND HEMATOCRIT, BLOOD
HCT: 26.3 % — ABNORMAL LOW (ref 36.0–46.0)
Hemoglobin: 8.9 g/dL — ABNORMAL LOW (ref 12.0–15.0)

## 2011-03-09 LAB — URINE CULTURE

## 2011-03-09 LAB — CBC
HCT: 26.4 % — ABNORMAL LOW (ref 36.0–46.0)
MCHC: 34 g/dL (ref 30.0–36.0)
MCV: 86.2 fL (ref 78.0–100.0)
MCV: 87.2 fL (ref 78.0–100.0)
MCV: 87.9 fL (ref 78.0–100.0)
Platelets: 146 10*3/uL — ABNORMAL LOW (ref 150–400)
Platelets: 150 10*3/uL (ref 150–400)
Platelets: 164 10*3/uL (ref 150–400)
Platelets: 181 10*3/uL (ref 150–400)
RBC: 2.79 MIL/uL — ABNORMAL LOW (ref 3.87–5.11)
RDW: 14.2 % (ref 11.5–15.5)
RDW: 14.2 % (ref 11.5–15.5)
WBC: 6.1 10*3/uL (ref 4.0–10.5)
WBC: 6.7 10*3/uL (ref 4.0–10.5)

## 2011-03-09 LAB — URINALYSIS, ROUTINE W REFLEX MICROSCOPIC
Glucose, UA: NEGATIVE mg/dL
Hgb urine dipstick: NEGATIVE
Ketones, ur: NEGATIVE mg/dL
Protein, ur: NEGATIVE mg/dL

## 2011-03-09 LAB — PROTIME-INR
INR: 2.21 — ABNORMAL HIGH (ref 0.00–1.49)
Prothrombin Time: 18.7 seconds — ABNORMAL HIGH (ref 11.6–15.2)
Prothrombin Time: 24.3 seconds — ABNORMAL HIGH (ref 11.6–15.2)

## 2011-03-09 LAB — PREPARE RBC (CROSSMATCH)

## 2011-03-13 LAB — BASIC METABOLIC PANEL
BUN: 33 mg/dL — ABNORMAL HIGH (ref 6–23)
BUN: 37 mg/dL — ABNORMAL HIGH (ref 6–23)
BUN: 38 mg/dL — ABNORMAL HIGH (ref 6–23)
CO2: 26 mEq/L (ref 19–32)
Calcium: 8.3 mg/dL — ABNORMAL LOW (ref 8.4–10.5)
Chloride: 101 mEq/L (ref 96–112)
Chloride: 103 mEq/L (ref 96–112)
Chloride: 105 mEq/L (ref 96–112)
Creatinine, Ser: 3.24 mg/dL — ABNORMAL HIGH (ref 0.4–1.2)
GFR calc Af Amer: 17 mL/min — ABNORMAL LOW (ref >60)
GFR calc Af Amer: 28 mL/min — ABNORMAL LOW (ref >60)
Glucose, Bld: 105 mg/dL — ABNORMAL HIGH (ref 70–99)
Potassium: 4.1 mEq/L (ref 3.5–5.1)
Potassium: 4.9 mEq/L (ref 3.5–5.1)

## 2011-03-13 LAB — CBC
HCT: 28.2 % — ABNORMAL LOW (ref 36.0–46.0)
HCT: 37 % (ref 36.0–46.0)
Hemoglobin: 12.5 g/dL (ref 12.0–15.0)
MCHC: 33.9 g/dL (ref 30.0–36.0)
MCV: 87.3 fL (ref 78.0–100.0)
MCV: 87.9 fL (ref 78.0–100.0)
Platelets: 190 10*3/uL (ref 150–400)
RBC: 4.23 MIL/uL (ref 3.87–5.11)
RDW: 14.6 % (ref 11.5–15.5)

## 2011-03-13 LAB — COMPREHENSIVE METABOLIC PANEL
ALT: 19 U/L (ref 0–35)
CO2: 27 mEq/L (ref 19–32)
Calcium: 9.4 mg/dL (ref 8.4–10.5)
Creatinine, Ser: 1.9 mg/dL — ABNORMAL HIGH (ref 0.4–1.2)
GFR calc non Af Amer: 26 mL/min — ABNORMAL LOW (ref >60)
Glucose, Bld: 101 mg/dL — ABNORMAL HIGH (ref 70–99)

## 2011-03-13 LAB — TYPE AND SCREEN
ABO/RH(D): O POS
Antibody Screen: NEGATIVE

## 2011-03-13 LAB — URINE MICROSCOPIC-ADD ON

## 2011-03-13 LAB — URINALYSIS, ROUTINE W REFLEX MICROSCOPIC
Glucose, UA: NEGATIVE mg/dL
Ketones, ur: NEGATIVE mg/dL
Protein, ur: 30 mg/dL — AB

## 2011-03-13 LAB — PROTIME-INR
INR: 1.15 (ref 0.00–1.49)
Prothrombin Time: 12.8 seconds (ref 11.6–15.2)
Prothrombin Time: 14.6 seconds (ref 11.6–15.2)

## 2011-03-13 LAB — APTT: aPTT: 28 seconds (ref 24–37)

## 2011-03-22 ENCOUNTER — Encounter: Payer: Self-pay | Admitting: Internal Medicine

## 2011-03-24 ENCOUNTER — Encounter: Payer: Self-pay | Admitting: Internal Medicine

## 2011-04-21 IMAGING — CR DG KNEE 1-2V PORT*R*
2 series · 2 of 2 positions shown · non-contrast
Comparison: None.

CLINICAL DATA: Degenerative joint disease status post total knee
arthroplasty.

PORTABLE RIGHT KNEE - 1-2 VIEW

[view not recorded (1 of 2)]
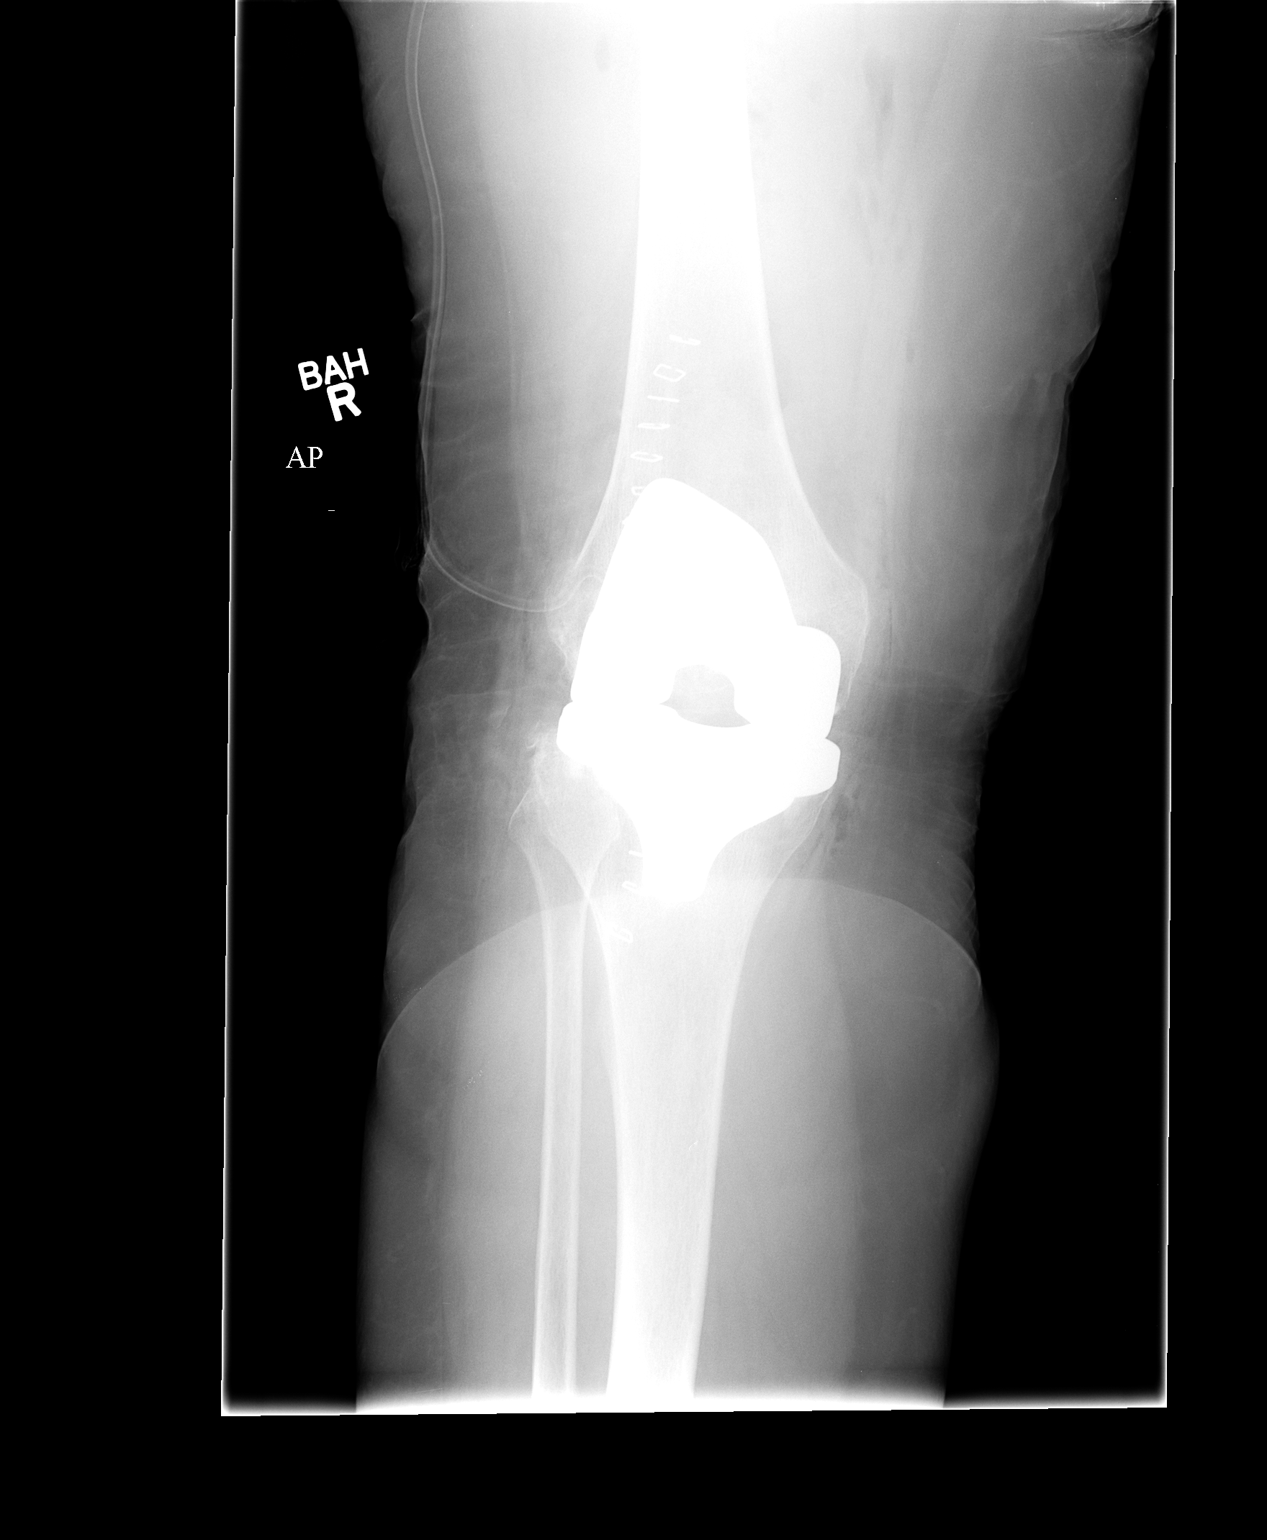

[view not recorded (2 of 2)]
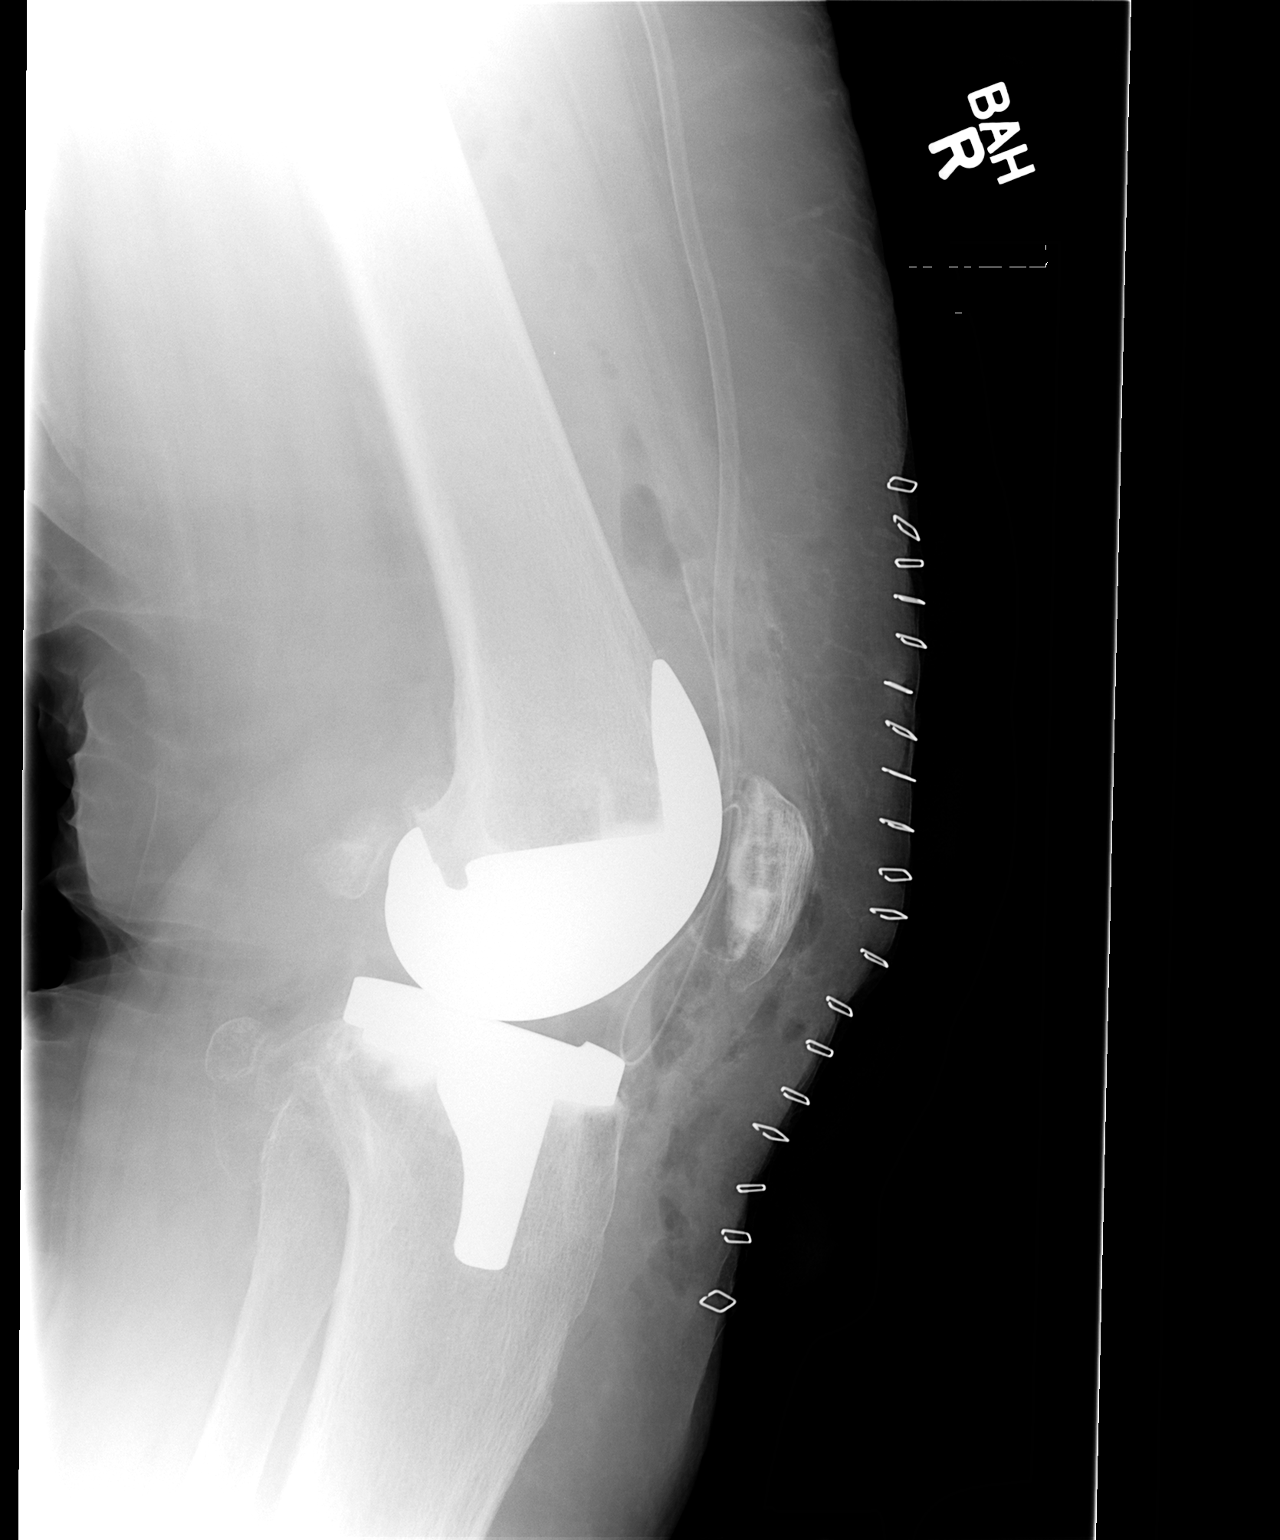

[2 of 2 positions shown; findings below may reference images not displayed]

FINDINGS: Remaining mass or mass in the fracture identified.
Alignment appears normal.  The polymer spacer between the femur and
tibia is only partially visualized due to obliquity of imaging.

A drain is present in the knee joint.  Gas is present in the
surrounding soft tissues.

On the lateral projection there is faint ossific density projecting
posterior to the proximal tibia and fibula compatible with an
ossific fragment and measuring approximately 1.2 cm in diameter.
IMPRESSION: 1.  No fracture or specific complicating feature.
2.  Small ossific fragment projecting posterior to the proximal
tibia and fibula on the lateral projection, likely incidental

## 2011-05-03 NOTE — Op Note (Signed)
NAMEMikeyla, [name]                    ACCOUNT NO.:  [id number]   MEDICAL RECORD NO.:  [id number]          PATIENT TYPE:  AMB   LOCATION:  [facility]                         FACILITY:  [facility]   PHYSICIAN:  Lucina Mellow. Terance Hart, M.D.DATE OF BIRTH:  [birth year]   DATE OF PROCEDURE:  08/22/2008  DATE OF DISCHARGE:                               OPERATIVE REPORT   PREOPERATIVE DIAGNOSIS:  Rule out bilateral hydronephrosis.   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   PROCEDURE:  Cystoscopy, bilateral retrograde pyelograms with  interpretation, right ureteroscopy and stone basketing and right double-  J catheter placement.   SURGEON:  Lucina Mellow. Terance Hart, M.D.   ANESTHESIA:  General.   INDICATIONS:  This 76 year old lady has had some chronic renal  insufficiency with a creatinine around 2.  She had an ultrasound that  questioned bilateral hydronephrosis.  CT scan was performed and it  showed nonobstructing bilateral renal calculi with a suspected partial  staghorn stone in the lower pole on the right and a 1 mm stone on the  lower pole at the left.  There also on CT scan appeared that she had  bilateral peripelvic cysts.  She is brought to the OR today for further  evaluation.   PROCEDURE IN DETAIL:  The patient was brought to the operating room,  placed in lithotomy position.  She was prepped and draped in the usual  fashion.  Cystoscopy revealed a normal appearing bladder.  Bilateral  retrograde pyelograms with interpretation were performed by inserting a  cone-tipped ureteral catheter through the cystoscope.  Both ureters were  very delicate.  The left pyelocalyceal system had no evidence of  obstruction, just minor distortion from probable peripelvic cyst.  The  right side had a filling defect of the renal pelvis secondary to her  stone and what also appeared to be a calyceal diverticulum.   I then  inserted a guidewire into the renal pelvis and inserted a  ureteral access dilating sheath.  Then used the flexible ureteroscope  and retrieved a small piece of soft putty like material that had some  graininess to it that is consistent with matrix stone.  I was able to  visualize the renal pelvis but not very well because most of the space  was taken up by this matrix appearing stone.  I was able to basket a  couple of small pieces of stone but there was very little room in which  to work in this area.  She may have a partial UPJ obstruction on that  side.  At this point I opted to leave in a  6 French 26 double-J catheter and that was placed in position without  difficulty.  The string was left off.  The bladder was emptied, the  scope removed and the patient sent to the recovery room in good  condition.  The stone will be sent off for analysis.      Lucina Mellow. Terance Hart, M.D.  Electronically Signed     LJP/MEDQ  D:  08/22/2008  T:  08/23/2008  Job:  [id number]   cc:   Elzie Rings. Lorrene Reid, M.D.  Fax: [phone]   Marletta Lor, MD  [place]  [place] [address]

## 2011-05-05 ENCOUNTER — Encounter: Payer: Self-pay | Admitting: Internal Medicine

## 2011-05-06 NOTE — Op Note (Signed)
NAMEMarianthi, Florestal Armstrong                    ACCOUNT NO.:  [id number]   MEDICAL RECORD NO.:  [id number]          PATIENT TYPE:  AMB   LOCATION:  ENDO                         FACILITY:  [facility]   PHYSICIAN:  James L. Rolla Flatten., M.D.DATE OF BIRTH:  03/11/1935   DATE OF PROCEDURE:  10/14/2004  DATE OF DISCHARGE:                                 OPERATIVE REPORT   PROCEDURE:  Colonoscopy and coagulation of polyp.   SURGEON:   MEDICATIONS:  Fentanyl 100 mcg and Versed 10 mg IV.   INDICATIONS FOR PROCEDURE:  Strong family history of colon cancer.  Done as  five-year follow-up.   SCOPE:  Pediatric colonoscope.   DESCRIPTION OF PROCEDURE:  The patient has been explained to the patient and  consent obtained.  In the left lateral decubitus position, the Olympus scope  was inserted and advanced.  Prep excellent.  Able to easily reach the cecum.  The ileocecal valve and appendiceal orifice were seen.  Scope was withdrawn  to the cecum.  Ascending colon, transverse colon, descending and sigmoid  colon were seen well.  No significant diverticular disease.  No polyps were  seen in the rectum.  A 3 mm sessile polyp was seen in approximately 20 cm  and was cauterized with hot biopsy forceps.  No other polyps were seen.  The  scope was withdrawn.  The patient tolerated the procedure well.   ASSESSMENT:  1.  Small rectal polyp cauterized, 211.4.  2.  Family history of colon cancer, V16.0.   PLAN:  Routine post polypectomy instructions.  Will recommend repeating  procedure in five years.       JLE/MEDQ  D:  10/14/2004  T:  10/14/2004  Job:  [id number]   cc:   Marletta Lor, M.D. [facility]

## 2011-05-06 NOTE — Discharge Summary (Signed)
[name], [name]                              ACCOUNT NO.:  [id number]   MEDICAL RECORD NO.:  [id number]                   PATIENT TYPE:  IPS   LOCATION:  4028                                 FACILITY:  [facility]   PHYSICIAN:  Jarvis Morgan, M.D.                DATE OF BIRTH:  [birth year]   DATE OF ADMISSION:  07/30/2004  DATE OF DISCHARGE:  08/04/2004                                 DISCHARGE SUMMARY   DISCHARGE DIAGNOSES:  1. Left total knee arthroplasty secondary to osteoarthritis.  2. Anemia.  3. Slightly elevated liver function tests.  4. History of hypothyroidism.  5. History of asthma.  6. History of hypertension.  7. Muscle spasms.   HISTORY OF PRESENT ILLNESS:  The patient is a 76 year old white female with  past history of hypertension, asthma and left knee pain who has failed  conservative care and elected to undergo a left total knee arthroplasty to  OA by Dr. Kathryne Hitch on July 26, 2004.  Placed on Coumadin for DVT  prophylaxis.  PT poor at this time.  The patient is ambulating min assist 15  feet with standing walker.  Transfer min assist.  __________ mod assist.  The patient did have Dopplers performed on July 29, 2004 with no evidence  of DVT.  Hospital course significant for constipation.   REVIEW OF SYSTEMS:  Significant for urinary incontinence, shortness of  breath, some joint swelling, weakness, depression.   PAST MEDICAL HISTORY:  Significant for hypertension, benign tumor, goiter,  asthma, gastroesophageal reflux disease, hypothyroidism.   PAST SURGICAL HISTORY:  Significant for hysterectomy, tonsillectomy,  bilateral cataracts, knee scope.   FAMILY HISTORY:  Noncontributory.   SOCIAL HISTORY:  The patient lives with husband in two-level home, but can  stay on the first floor.  Three to four steps to entry.  Independent prior  to admission.  No tobacco.  Four adult children.  Limited family support.  Denies alcohol use.   MEDICATIONS PRIOR TO  ADMISSION:  1. Nexium 40 mg daily.  2. Aspirin 81 mg daily.  3. Zyrtec 10 mg q.h.s.  4. Norvasc 5 mg p.o. daily.  5. Benazepril 20 mg q.h.s.   ALLERGIES:  None.   HOSPITAL COURSE:  [name] was admitted to [facility]  on July 30, 2004 for comprehensive rehabilitation to receive more than  three hours of daily therapy.  Overall, during her five day stay she  progressed very well with discharge at modified independent level.  Able to  ambulate 60 feet with standing walker, transfer modified independent,  __________ modified pending.  The patient remained on Coumadin for DVT  prophylaxis without any bleeding complication noted.  Surgical incision  healing fairly well.  Staples still intact at time of discharge.  Staples to  removed in Dr. Quillian Quince Murphy's office early next week.  The patient's  hospital course significant  for anemia as well as elevated FFTs and  occasional asthma exacerbation.  The patient received nebulizer treatment  p.r.n. She also remained on Humibid LA 60 mg b.i.d. and Singulair 10 mg p.o.  daily.  The patient was placed on Trinsicon one tablet p.o. b.i.d. for  anemia.  Latest hemoglobin was performed on August 04, 2004 was 9.6,  hematocrit 28.0.   The patient did have admission labs performed which revealed that she had a  slightly elevated FFTs, AST 74, ALT 97, alkaline phos 175.  Repeat liver  function tests performed on day of discharge showed significant improvement.  ALT 49, AST 28.  The patient's blood pressure remained under good control on  Lotensin 20 mg daily as well as Norvasc 5 mg daily.  No adjustment in blood  pressure medications.  The patient also remained on her Synthroid medication  for history of hypothyroidism.   There was no other major issues occurred while the patient was in rehab.  Latest labs indicate latest hemoglobin 9.6, hematocrit 28.0, white count  8.1, platelet 372, sodium 137, potassium 4.0, chloride 103,  Co2 28, BUN 12,  creatinine 0.9, glucose 104.   PT report at the time of discharge.  The patient can perform all ADLs  modified activity level, able to perform transfers modified independent  level and bed __________ modified independent level.  She can ambulate 50  feet, 60 degrees.  She had total 80 degrees of range of motion in her left  knee.  At the time of discharge her vitals were stable.  Surgery incision  staples still intact, 1+ edema.  No significant discharge or drainage from  incision.  All vitals were fairly stable and the patient discharged home  with her husband.   DISCHARGE MEDICATIONS:  1. Coumadin 5 mg daily.  2. Os-Cal plus D daily.  3. Trinsicon one tablet twice daily.  4. Norvasc 5 mg daily.  5. Synthroid 75 mcg daily.  6. Benazepril 20 mg daily.  7. Singulair 10 mg daily.  8. Zyrtec 10 mg daily.  9. Resume calcium, inhalers, multivitamin and Nexium.   The patient is to take no aspirin, ibuprofen or Aleve while on Coumadin.  She will remain on Robaxin 500 mg one tablet as needed for spasms, oxycodone  5-10 mg every 4-6 hours as needed for pain.   ACTIVITY:  No drinking alcohol, no smoking.  Use walker.  Knee precautions.   She has staples removed next week in Dr. Debroah Loop office.  Have [facility] home  health care for PT/OT and a nurse.  Nurse to draw PT, INR on Friday, August 06, 2004.  Followup with Dr. Quillian Quince Murphy's office next week.  Follow with  Dr. Burnice Logan in two to four weeks to check anemia.      Pamella Pert, P.A.                         Jarvis Morgan, M.D.    LB/MEDQ  D:  08/04/2004  T:  08/05/2004  Job:  [id number]   cc:   Ninetta Lights, M.D.  [address]  [place] [address]  Fax: [phone]   Marletta Lor, M.D. [facility]

## 2011-05-06 NOTE — Assessment & Plan Note (Signed)
[facility]                            [facility] OFFICE NOTE   [name], [name]                           MRN:          [id number]  DATE:11/03/2006                            DOB:          September 27, 1935    A 76 year old female seen today for a comprehensive exam.  She has a  history of hypertension, mild hypercholesterolemia, obesity,  hypothyroidism, COPD, asthma, and allergic rhinitis.  She is followed  closely by a number of specialist.  She was status post left total knee  replacement by Dr. Percell Miller.  She has had a hysterectomy and bladder tack  in the past.   REVIEW OF SYSTEMS:  Negative.  She had a colonoscopy by Dr. Oletta Lamas in  2005.  Does receive annual mammogram.   FAMILY HISTORY:  Mother recently died at 41.  Father had coronary artery  disease and prostate cancer.  Family history is positive for colon  cancer, hypertension.   PHYSICAL EXAMINATION:  GENERAL:  An overweight white female with a  weight of 240.  VITAL SIGNS:  Blood pressure repeat was 130/70.  HEENT:  Fundi, ears, nose, and throat clear.  CHEST:  Clear.  CARDIOVASCULAR:  Normal heart sounds.  No murmurs.  BREASTS:  Negative.  ABDOMEN:  Obese, soft and nontender.  No organomegaly.  PELVIC:  Absent uterus.  No adnexal masses.  Stool heme negative.  EXTREMITIES:  Full peripheral pulses, no edema.   IMPRESSION:  Hypertension, hypercholesterolemia, obesity, stress  incontinence, degenerative joint disease.   DISPOSITION:  She will consider referral to a gynecologist for  consideration of surgery for her incontinence.  Weight loss encouraged.  She is to return in 3-4 months for followup.  Will check a fasting lipid  panel in the spring and consider statin therapy for her cholesterol is  unimproved.     Marletta Lor, MD  Electronically Signed    PFK/MedQ  DD: 11/03/2006  DT: 11/03/2006  Job #: [id number]

## 2011-05-06 NOTE — Op Note (Signed)
NAMEJOSSALYN, [name]                              ACCOUNT NO.:  [id number]   MEDICAL RECORD NO.:  [id number]                   PATIENT TYPE:  INP   LOCATION:  2899                                 FACILITY:  [facility]   PHYSICIAN:  Ninetta Lights, M.D.              DATE OF BIRTH:  Apr 25, 1935   DATE OF PROCEDURE:  07/26/2004  DATE OF DISCHARGE:                                 OPERATIVE REPORT   PREOPERATIVE DIAGNOSIS:  End-stage degenerative arthritis, left knee, with  varus alignment.   POSTOPERATIVE DIAGNOSIS:  End-stage degenerative arthritis, left knee, with  varus alignment.   OPERATION:  Left total knee replacement with Osteonics prosthesis, with soft  tissue balancing and medial capsular release, cemented #7 posterior  stabilized femoral component, cemented #7 tibial tray with 12 mm posterior  stabilized Flex insert, cemented recessed 28 mm patellar component.   SURGEON:  Ninetta Lights, M.D.   ASSISTANT:  Aaron Edelman D. Petrarca, P.A.-C.   ANESTHESIA:  General.   ESTIMATED BLOOD LOSS:  Minimal.   TOURNIQUET TIME:  One hour and 20 minutes.   SPECIMENS:  Bone and soft tissue.   CULTURES:  None.   COMPLICATIONS:  None.   DRESSING:  A soft compression with a knee immobilizer.   DRAINS:  Hemovac x2.   DESCRIPTION OF PROCEDURE:  The patient was brought to the operating room and  placed on the operating room table in the supine position.  After adequate  anesthesia had been obtained, the left knee was examined.  Alignment 5  degrees of varus, correctable to neutral.  Stable ligaments, almost full  extension.  Further flexion to 120 degrees.  The tourniquet was applied.  Prepped and draped.  Exsanguinated with the elevation of the Esmarch.  The  tourniquet inflated to 350 mmHg.  A straight incision above the patella,  down to the tibial tubercle.  A medial arthrotomy.  Hemostasis with cautery.  A medial capsular release.  Grade 4 changes throughout.  Remnants of  menisci, the  cruciate ligaments and loose bodies were all removed. Articular  spurs removed.  The intramedullary guide placed in the femur.  Distal cut  set at 5 degrees of valgus, removing 10 mm.  Sized to a #7 component with  appropriate rotation.  Jigs put in place and tibial cuts made.  Attention  was turned to the tibia.  The tibial spine removed with the saw.  The  intramedullary guide placed.  The proximal cut, 5-degree posterior slope,  resecting 4 mm to 5 mm off the medial side.  Good fluoroscopic completion  with nice even cuts.  The patella was sized, reamed, and drilled for a 28 mm  component.  Trials put in place.  A #7 on the femur, a #7 on the tibia and a  28 mm on the patella.  With the  medial capsular release, and the 12 mm Flex  insert, I had full extension, full flexion, nicely balanced knee in slight  valgus, with good stability and flexion and extension, and good patellar  femoral tracking.  The tibia was marked for rotation and hand-reamed.  All  trials were removed.  Copious irrigation with the pulse irrigating device.  Cement prepared and placed on all components which are firmly seated and a  polyethylene attached at the tibia.  Excessive cement was removed.  The knee  was reduced.  Full extension, full flexion, good stability, good flexion  without lift-off, and good patellar femoral tracking.  The wound was  irrigated.  The Hemovac was placed and brought out through a separate stab  wound.  The arthrotomy was closed with #1 Vicryl.  The skin and subcutaneous  tissues closed with Vicryl and staples.  The margins of the wound were  injected with Marcaine, as was the knee, and the Hemovac was clamped.  A  sterile compressive dressing was applied.  The tourniquet was deflated and  removed.  The knee immobilizer was applied.  Anesthesia was reversed.  The patient was brought to the recovery room.  She tolerated the surgery  well with no complications.                                                Ninetta Lights, M.D.    DFM/MEDQ  D:  07/26/2004  T:  07/26/2004  Job:  [id number]

## 2011-05-06 NOTE — Op Note (Signed)
[name], LAMP                    ACCOUNT NO.:  [id number]   MEDICAL RECORD NO.:  [id number]          PATIENT TYPE:  AMB   LOCATION:  [place]                          FACILITY:  [facility]   PHYSICIAN:  Ninetta Lights, M.D. DATE OF BIRTH:  1935-11-19   DATE OF PROCEDURE:  03/09/2005  DATE OF DISCHARGE:                                 OPERATIVE REPORT   PREOPERATIVE DIAGNOSES:  Left carpal tunnel syndrome.   POSTOPERATIVE DIAGNOSES:  Left carpal tunnel syndrome.   OPERATION PERFORMED:  Left carpal tunnel release.   SURGEON:  Ninetta Lights, M.D.   ASSISTANT:  Alyson Locket. Velora Heckler.   ANESTHESIA:  IV regional.   SPECIMENS:  None.   CULTURES:  None.   COMPLICATIONS:  None.   DRESSING:  Soft compressive with bulky hand dressing and splint.   DESCRIPTION OF PROCEDURE:  The patient was brought to the operating room and  after adequate anesthesia had been obtained, the left arm was prepped and  draped in the usual sterile fashion.  An incision along the thenar eminence  heading slightly ulnarward at the distal wrist crease.  Skin and  subcutaneous tissue divided.  Hemostasis with bipolar cautery.  Retinaculum  over the carpal tunnel opened from the forearm fascia proximally, the palmar  arch distally.  Marked thinning, compression and erythema on the median  nerve throughout the entire canal.  Improved after carpal tunnel release and  epineurotomy.  Digital branches, motor branches were identified, protected,  decompressed.  No other abnormalities in the canal.  Wound irrigated.  Skin  closed with interrupted mattress nylon suture.  Margins of the wound  injected with Marcaine without epinephrine.  Sterile compressive dressing  with bulky hand dressing and splint applied.  Anesthesia reversed.  Brought  to recovery room.  Tolerated surgery well without complication.      DFM/MEDQ  D:  03/09/2005  T:  03/09/2005  Job:  [id number]

## 2011-05-06 NOTE — H&P (Signed)
[facility]  Patient:    [name], [name]                           MRN: [id number] Adm. Date:  [id number] Attending:  Carlisle Cater                         History and Physical  PREOPERATIVE DIAGNOSIS:  Lacerated ulnar digital nerve, right thumb.  POSTOPERATIVE DIAGNOSIS:  Lacerated ulnar digital nerve, right thumb.  PROCEDURE:  Repair of ulnar digital nerve, right thumb.  SURGEON:  Daryl Eastern, M.D.  ANESTHESIA:  Axillary block augmented with 0.5% Marcaine local.  OPERATIVE FINDINGS:  The patient had a 90% transection of the ulnar nerve digital of the right thumb in the first web space.  The tendons were intact.  DESCRIPTION OF PROCEDURE:  Under axillary block anesthesia with the tourniquet on the right arm.  The right hand was prepped and draped in the usual fashion and 0.5% Marcaine local was injected around the wound.  After exsanguinating the limb, the tourniquet was inflated to 250 mmHg.  The Steri-Strips were removed and the wound was spread open with the scissors.  The laceration was extended volarly a half an inch.  Blunt dissection was carried down to the digital nerve which was easily identifiable in the wound.  The microscope was brought in and the nerve was repaired with a 9-0 nylon suture.  An epineural repair was performed.  The nerve was found to be 90% transected.  The wound was then irrigated copiously with saline.  The skin was closed with 4-0 nylon sutures.  Sterile dressings were applied followed by aluminum splint.  The tourniquet was released with good circulation of the hand.  The patient went to the recovery room awake, stable, and in good condition. DD:  04/25/01 TD:  04/26/01 Job: [id number]

## 2011-05-06 NOTE — Discharge Summary (Signed)
[name], [name]                              ACCOUNT NO.:  [id number]   MEDICAL RECORD NO.:  [id number]                   PATIENT TYPE:  INP   LOCATION:  5040                                 FACILITY:  [facility]   PHYSICIAN:  Ninetta Lights, M.D.              DATE OF BIRTH:  [birth year]   DATE OF ADMISSION:  07/26/2004  DATE OF DISCHARGE:  07/30/2004                                 DISCHARGE SUMMARY   ADMITTING DIAGNOSES:  Advanced degenerative joint disease of the left knee.   DISCHARGE DIAGNOSES:  1.  Advanced degenerative joint disease of the left knee.  2.  History of asthma.  3.  Hypertension.  4.  Hyperlipidemia.   PROCEDURE:  Left total knee replacement.   HISTORY:  A 76 year old female with moderately severe left knee pain which  is worsening causing her difficulties with her activities of daily living.  She is now having pain with every step as well as nighttime pain.  She has  failed conservative treatment including that of Synvisc viscous  supplementation.  She has end-stage DJD radiographically.  She is now  indicated for left total knee replacement.   HOSPITAL COURSE:  A 76 year old female admitted July 26, 2004 after  appropriate laboratory studies were obtained as well as 1 g Ancef IV on-call  to the operating room.  Was taken to the operating room where she underwent  a left total knee replacement.  She tolerated the procedure well.  She was  continued on Ancef 1 g IV q.8h. x3 doses.  A Dilaudid PCA pump was used for  pain management.  Heparin 5000 units subcutaneous q.12h. was begun until her  Coumadin became therapeutic.  Consultations with PT, OT, and rehabilitation  were made.  Ambulate weightbearing as tolerated.  Foley was placed  intraoperatively.  CPM 0-30 degrees for 8-10 hours per day was started.  She  was allowed out of bed to a chair the following day.  Physical therapy then  began ambulation.  She developed temperature on the 10th to 100.6.  Her  saturations dropped to 75% on room air.  Her allergist, Dr. Shaune Leeks, was  consulted for pulmonary evaluation.  A stat ABG and chest x-ray was  obtained.  After the ABGs she was begun on oxygen 2-3 L/min by nasal  cannula.  O2 saturations were obtained one-half hour after O2 was started.  She did have hyponatremia and fluid restriction was obtained.  [place]  hospitalists were consulted.  On the 10th fasting lipid profile, TSH,  cortisol, CBC, BMET were ordered.  Albuterol nebulizer with Atrovent q.i.d.  while awake was begun.  Guaifenesin 600 mg one tablet q.12h.  CT of the  chest with contrast media to rule out PE was obtained.  This was negative.  Cipro 500 mg p.o. b.i.d. was also started by the hospitalists.  On the 11th  the Os-Cal 500 mg  p.o. daily was started.  The Cipro was discontinued  because of a negative urine.  She was weaned off the PCA also.  Dopplers of  the lower extremities were obtained on the 11th of August.  She was weaned  off of her O2.  Her fluid restrictions were discontinued on the 12th.  She  was transferred to rehabilitation on the 12th in improved condition.   Dopplers of August 11 revealed no obvious evidence of DVT, superficial  thrombosis, or Baker's cysts bilaterally.  EKG revealed normal sinus rhythm,  normal EKG.  Left knee x-ray reveals a left knee replacement without  fracture or apparent complication.  Chest of August 10 revealed no active  lung disease with mild cardiomegaly.  Chest CT with contrast:  Impression  revealed cannot exclude pulmonary embolus at the bifurcation of the left  upper lobe pulmonary artery.  Consider pulmonary artery arteriogram if  necessary to evaluate further.  Small nodule in the right middle lobe.  Consider followup of CT in six months.  Blood gases of August 10 revealed a  pH of 7.418, pCO2 of 42.7, pO2 53.5, bicarbonate 27.1, pCO2 28.4, base  excess 2.9, saturation 88.1.  Preoperative hemoglobin 13, hematocrit 38.0,   white count 7.3, platelets 276,000.  Discharge hemoglobin 8.3, hematocrit  23.8%, white count 5500, platelets 194,000.  Discharge pro time 15.8 with an  INR 1.4.  Chemistries:  Sodium 141, potassium 4.5, chloride 107, CO2 28,  glucose 97, BUN 19, creatinine 1, calcium 9.8, total protein 6.4, albumin  3.7, AST 27,  ALT 39, ALP 119, total bilirubin 0.8.  Discharge sodium 137,  potassium 3.6, chloride 101, CO2 29, glucose 116, BUN 12, creatinine 0.9,  calcium 8.5.  Sodium was its lowest on the 10th of August at 129.  Her  osmolality on the 10th was 274.  Lipid profile of August 2 reveals a  cholesterol of 281, triglycerides 240, HDL 63, cholesterol/HDL ratio 4.5,  VLDL 48, LDL 170.  Lipid profile of August 11 was 173 for cholesterol with a  triglyceride of 129.  HDL was 49, cholesterol/HDL ratio 3.5, VLDL  cholesterol 26, LDL cholesterol 98.  TSH was 0.621.  Morning cortisol of  August 11 was 9.3.  Urinalysis preoperatively revealed 7-10 white cells, no  red cells, and too numerous to count bacteria.  Urinalysis on August 10  revealed 0-2 white cells, rare epithelials, and no bacteria.  Blood type was  O+.  Antibody screen negative.  Urine culture showed 6000 colonies per mL of  insignificant growth.   DISCHARGE INSTRUCTIONS:  She was transferred to rehabilitation where she  will continue with physical and occupational therapy.  She was discharged in  improved condition.      Mike Craze Petrarca, P.A.-C.                Ninetta Lights, M.D.    BDP/MEDQ  D:  08/20/2004  T:  08/21/2004  Job:  [id number]

## 2011-05-10 ENCOUNTER — Encounter: Payer: Self-pay | Admitting: Internal Medicine

## 2011-05-10 ENCOUNTER — Ambulatory Visit (INDEPENDENT_AMBULATORY_CARE_PROVIDER_SITE_OTHER): Payer: BC Managed Care – PPO | Admitting: Internal Medicine

## 2011-05-10 DIAGNOSIS — I10 Essential (primary) hypertension: Secondary | ICD-10-CM

## 2011-05-10 DIAGNOSIS — K219 Gastro-esophageal reflux disease without esophagitis: Secondary | ICD-10-CM

## 2011-05-10 DIAGNOSIS — E039 Hypothyroidism, unspecified: Secondary | ICD-10-CM

## 2011-05-10 MED ORDER — SOLIFENACIN SUCCINATE 10 MG PO TABS: 10.0000 mg | ORAL_TABLET | Freq: Every day | ORAL | Status: DC

## 2011-05-10 NOTE — Patient Instructions (Signed)
Limit your sodium (Salt) intake    It is important that you exercise regularly, at least 20 minutes 3 to 4 times per week.  If you develop chest pain or shortness of breath seek  medical attention.  You need to lose weight.  Consider a lower calorie diet and regular exercise.  Avoids foods high in acid such as tomatoes citrus juices, and spicy foods.  Avoid eating within two hours of lying down or before exercising.  Do not overheat.  Try smaller more frequent meals.  If symptoms persist, elevate the head of her bed 12 inches while sleeping.  Return in 6 months for follow-up

## 2011-05-10 NOTE — Progress Notes (Signed)
  Subjective:    Patient ID: [name], female    DOB: April 03, 1935, 76 y.o.   MRN: [id number]  HPI  76 year old patient who is seen today for followup. She has a history of treated hypertension. She is doing well today without much in with concerns or complaints. She has mild asthma that has been stable. There's been some weight gain since her last visit here. She has hypothyroidism and a history of gastroesophageal reflux disease. Wt Readings from Last 3 Encounters:  05/10/11 250 lb (113.399 kg)  11/08/10 243 lb (110.224 kg)  05/06/10 238 lb (107.956 kg)      Review of Systems  Constitutional: Positive for unexpected weight change.  HENT: Negative for hearing loss, congestion, sore throat, rhinorrhea, dental problem, sinus pressure and tinnitus.   Eyes: Negative for pain, discharge and visual disturbance.  Respiratory: Negative for cough and shortness of breath.   Cardiovascular: Negative for chest pain, palpitations and leg swelling.  Gastrointestinal: Negative for nausea, vomiting, abdominal pain, diarrhea, constipation, blood in stool and abdominal distention.  Genitourinary: Negative for dysuria, urgency, frequency, hematuria, flank pain, vaginal bleeding, vaginal discharge, difficulty urinating, vaginal pain and pelvic pain.  Musculoskeletal: Negative for joint swelling, arthralgias and gait problem.  Skin: Negative for rash.  Neurological: Negative for dizziness, syncope, speech difficulty, weakness, numbness and headaches.  Hematological: Negative for adenopathy.  Psychiatric/Behavioral: Negative for behavioral problems, dysphoric mood and agitation. The patient is not nervous/anxious.        Objective:   Physical Exam  Constitutional: She is oriented to person, place, and time. She appears well-developed and well-nourished. No distress.       Obese Blood pressure 130/72  HENT:  Head: Normocephalic.  Right Ear: External ear normal.  Left Ear: External ear normal.    Mouth/Throat: Oropharynx is clear and moist.  Eyes: Conjunctivae and EOM are normal. Pupils are equal, round, and reactive to light.  Neck: Normal range of motion. Neck supple. No thyromegaly present.  Cardiovascular: Normal rate, regular rhythm, normal heart sounds and intact distal pulses.   Pulmonary/Chest: Effort normal and breath sounds normal.  Abdominal: Soft. Bowel sounds are normal. She exhibits no mass. There is no tenderness.  Musculoskeletal: Normal range of motion. She exhibits edema.       +1 ankle and pedal edema  Lymphadenopathy:    She has no cervical adenopathy.  Neurological: She is alert and oriented to person, place, and time.  Skin: Skin is warm and dry. No rash noted.  Psychiatric: She has a normal mood and affect. Her behavior is normal.          Assessment & Plan:   Hypertension stable Exogenous obesity Hypothyroidism Asthma Gastroesophageal reflux disease  Restricted salt diet weight loss exercise all encouraged. We'll see in 6 months for an annual physical and lab draw at that time Medications refilled

## 2011-05-20 ENCOUNTER — Other Ambulatory Visit: Payer: Self-pay | Admitting: Internal Medicine

## 2011-06-16 ENCOUNTER — Encounter: Payer: Self-pay | Admitting: Internal Medicine

## 2011-06-16 ENCOUNTER — Ambulatory Visit (INDEPENDENT_AMBULATORY_CARE_PROVIDER_SITE_OTHER): Payer: BC Managed Care – PPO | Admitting: Internal Medicine

## 2011-06-16 DIAGNOSIS — M1990 Unspecified osteoarthritis, unspecified site: Secondary | ICD-10-CM

## 2011-06-16 DIAGNOSIS — L723 Sebaceous cyst: Secondary | ICD-10-CM

## 2011-06-16 DIAGNOSIS — I10 Essential (primary) hypertension: Secondary | ICD-10-CM

## 2011-06-16 NOTE — Progress Notes (Signed)
  Subjective:    Patient ID: [name], female    DOB: 11-Oct-1935, 76 y.o.   MRN: [id number]  HPI  76 year old patient who noted a nodule in the right axilla. She first noted this about 10 days ago and over this period of observation has not changed it is described as being minimally tender. She is due for her routine mammogram in a couple of weeks.    Review of Systems  Skin: Positive for rash.       Objective:   Physical Exam  Constitutional:       Overweight. Blood pressure 110/70  Skin:       Both breasts were examined. No mass She had a 1 cm subcutaneous slightly tender erythematous nodule in the mid right axillary area. This was consistent with a small sebaceous cyst          Assessment & Plan:   Sebaceous cyst right axilla. We'll clinically observe. She will proceed with her mammogram in a couple weeks as scheduled. She'll call if this area does not resolve otherwise she will be seen in followup in November as scheduled

## 2011-06-16 NOTE — Patient Instructions (Signed)
Return to the office for reassessment if this area does not resolve  Limit your sodium (Salt) intake    It is important that you exercise regularly, at least 20 minutes 3 to 4 times per week.  If you develop chest pain or shortness of breath seek  medical attention.  Please check your blood pressure on a regular basis.  If it is consistently greater than 150/90, please make an office appointment.

## 2011-07-11 LAB — HM MAMMOGRAPHY: HM Mammogram: NEGATIVE

## 2011-07-12 ENCOUNTER — Encounter: Payer: Self-pay | Admitting: Internal Medicine

## 2011-08-16 ENCOUNTER — Other Ambulatory Visit: Payer: Self-pay | Admitting: Internal Medicine

## 2011-09-16 ENCOUNTER — Other Ambulatory Visit: Payer: Self-pay | Admitting: Internal Medicine

## 2011-09-20 ENCOUNTER — Ambulatory Visit (INDEPENDENT_AMBULATORY_CARE_PROVIDER_SITE_OTHER): Payer: BC Managed Care – PPO

## 2011-09-20 DIAGNOSIS — Z23 Encounter for immunization: Secondary | ICD-10-CM

## 2011-09-21 LAB — POCT I-STAT 4, (NA,K, GLUC, HGB,HCT)
HCT: 35 — ABNORMAL LOW
Sodium: 138

## 2011-09-21 LAB — STONE ANALYSIS: Stone Weight KSTONE: 0.002 g

## 2011-11-03 ENCOUNTER — Other Ambulatory Visit: Payer: Self-pay | Admitting: Internal Medicine

## 2011-11-08 ENCOUNTER — Other Ambulatory Visit (INDEPENDENT_AMBULATORY_CARE_PROVIDER_SITE_OTHER): Payer: BC Managed Care – PPO

## 2011-11-08 DIAGNOSIS — Z0000 Encounter for general adult medical examination without abnormal findings: Secondary | ICD-10-CM

## 2011-11-08 LAB — BASIC METABOLIC PANEL
CO2: 26 mEq/L (ref 19–32)
Calcium: 9.9 mg/dL (ref 8.4–10.5)
GFR: 25.01 mL/min — ABNORMAL LOW (ref >60.00)
Potassium: 4.8 mEq/L (ref 3.5–5.1)
Sodium: 142 mEq/L (ref 135–145)

## 2011-11-08 LAB — LIPID PANEL
Cholesterol: 172 mg/dL (ref 0–200)
HDL: 51.1 mg/dL (ref >39.00)
LDL Cholesterol: 88 mg/dL (ref 0–99)
Total CHOL/HDL Ratio: 3
Triglycerides: 165 mg/dL — ABNORMAL HIGH (ref 0.0–149.0)
VLDL: 33 mg/dL (ref 0.0–40.0)

## 2011-11-08 LAB — CBC WITH DIFFERENTIAL/PLATELET
Basophils Relative: 0.6 % (ref 0.0–3.0)
HCT: 34.4 % — ABNORMAL LOW (ref 36.0–46.0)
Hemoglobin: 11.1 g/dL — ABNORMAL LOW (ref 12.0–15.0)
Lymphocytes Relative: 31 % (ref 12.0–46.0)
Lymphs Abs: 1.7 10*3/uL (ref 0.7–4.0)
MCHC: 32.3 g/dL (ref 30.0–36.0)
Monocytes Relative: 7 % (ref 3.0–12.0)
Neutro Abs: 3.1 10*3/uL (ref 1.4–7.7)
RBC: 4.07 Mil/uL (ref 3.87–5.11)

## 2011-11-08 LAB — HEPATIC FUNCTION PANEL: Bilirubin, Direct: 0 mg/dL (ref 0.0–0.3)

## 2011-11-09 LAB — POCT URINALYSIS DIPSTICK
Blood, UA: NEGATIVE
Nitrite, UA: NEGATIVE
Spec Grav, UA: 1.025
Urobilinogen, UA: 0.2
pH, UA: 5.5

## 2011-11-15 ENCOUNTER — Encounter: Payer: BC Managed Care – PPO | Admitting: Internal Medicine

## 2011-11-18 ENCOUNTER — Other Ambulatory Visit: Payer: Self-pay | Admitting: Internal Medicine

## 2011-11-22 ENCOUNTER — Encounter: Payer: Self-pay | Admitting: Internal Medicine

## 2011-11-22 ENCOUNTER — Ambulatory Visit (INDEPENDENT_AMBULATORY_CARE_PROVIDER_SITE_OTHER): Payer: BC Managed Care – PPO | Admitting: Internal Medicine

## 2011-11-22 DIAGNOSIS — J45909 Unspecified asthma, uncomplicated: Secondary | ICD-10-CM

## 2011-11-22 DIAGNOSIS — M1990 Unspecified osteoarthritis, unspecified site: Secondary | ICD-10-CM

## 2011-11-22 DIAGNOSIS — Z23 Encounter for immunization: Secondary | ICD-10-CM

## 2011-11-22 DIAGNOSIS — E669 Obesity, unspecified: Secondary | ICD-10-CM

## 2011-11-22 DIAGNOSIS — N259 Disorder resulting from impaired renal tubular function, unspecified: Secondary | ICD-10-CM

## 2011-11-22 DIAGNOSIS — I10 Essential (primary) hypertension: Secondary | ICD-10-CM

## 2011-11-22 DIAGNOSIS — E039 Hypothyroidism, unspecified: Secondary | ICD-10-CM

## 2011-11-22 DIAGNOSIS — Z0000 Encounter for general adult medical examination without abnormal findings: Secondary | ICD-10-CM

## 2011-11-22 DIAGNOSIS — E7800 Pure hypercholesterolemia, unspecified: Secondary | ICD-10-CM

## 2011-11-22 MED ORDER — TRAMADOL HCL 50 MG PO TABS: 50.0000 mg | ORAL_TABLET | Freq: Four times a day (QID) | ORAL | Status: DC | PRN

## 2011-11-22 MED ORDER — SIMVASTATIN 20 MG PO TABS: 20.0000 mg | ORAL_TABLET | Freq: Every day | ORAL | Status: DC

## 2011-11-22 MED ORDER — ESOMEPRAZOLE MAGNESIUM 40 MG PO CPDR: 40.0000 mg | DELAYED_RELEASE_CAPSULE | Freq: Every day | ORAL | Status: DC

## 2011-11-22 MED ORDER — LEVOTHYROXINE SODIUM 75 MCG PO TABS: 75.0000 ug | ORAL_TABLET | Freq: Every day | ORAL | Status: DC

## 2011-11-22 MED ORDER — BENAZEPRIL HCL 40 MG PO TABS: 40.0000 mg | ORAL_TABLET | Freq: Every day | ORAL | Status: DC

## 2011-11-22 MED ORDER — SOLIFENACIN SUCCINATE 10 MG PO TABS: 10.0000 mg | ORAL_TABLET | Freq: Every day | ORAL | Status: DC

## 2011-11-22 MED ORDER — AMLODIPINE BESYLATE 5 MG PO TABS: 5.0000 mg | ORAL_TABLET | Freq: Every day | ORAL | Status: DC

## 2011-11-22 MED ORDER — HYDROCHLOROTHIAZIDE 25 MG PO TABS: 25.0000 mg | ORAL_TABLET | Freq: Every day | ORAL | Status: DC

## 2011-11-22 MED ORDER — ZOLPIDEM TARTRATE 5 MG PO TABS: 5.0000 mg | ORAL_TABLET | Freq: Every evening | ORAL | Status: DC | PRN

## 2011-11-22 MED ORDER — MONTELUKAST SODIUM 10 MG PO TABS: 10.0000 mg | ORAL_TABLET | Freq: Every day | ORAL | Status: DC

## 2011-11-22 MED ORDER — BECLOMETHASONE DIPROPIONATE 80 MCG/ACT IN AERS: 1.0000 | INHALATION_SPRAY | RESPIRATORY_TRACT | Status: DC | PRN

## 2011-11-22 NOTE — Progress Notes (Signed)
Subjective:    Patient ID: [name], female    DOB: 1935/01/25, 76 y.o.   MRN: [id number]  HPI 76 year old patient who is seen today for a preventative health examination. She is followed by renal medicine due to chronic kidney disease she has treated hypertension asthma hypertension and dyslipidemia. She also has a history of colonic polyps.   Allergies (verified):  No Known Drug Allergies   Past History:  Past Medical History:  Reviewed history from 11/06/2009 and no changes required.  Asthma  GERD  Hypertension  Hypothyroidism  Goiter  Benign tumor  obesity  insomnia  chronic kidney disease  Osteoarthritis  Nephrolithiasis, hx of  renal cysts  Colonic polyps, hx of   Past Surgical History:  Colonoscopy-10/14/2004  Hysterectomy with bladder tack, age 55  Tonsillectomy  Bilat Cataracts  Knee scope Percell Miller)  history carpal tunnel release  status post lithotripsy, fall 2009  s/p R TKR 03-2010  colonoscopy October 2005, Oct 2010   Family History:  Reviewed history from 10/27/2008 and no changes required.  father died age 20 MI. History prostate cancer  mother died age 57  one brother h/o of a stroke at age 1 due to a cerebral. aneurysm  one sister is well;  family history positive for hypertension, colon cancer, prostate cancer   Social History:  Reviewed history from 11/06/2009 and no changes required.  Married  Drug use-no  Never Smoked  Regular exercise-yes      Review of Systems  Constitutional: Negative for fever, appetite change, fatigue and unexpected weight change.  HENT: Negative for hearing loss, ear pain, nosebleeds, congestion, sore throat, mouth sores, trouble swallowing, neck stiffness, dental problem, voice change, sinus pressure and tinnitus.   Eyes: Negative for photophobia, pain, redness and visual disturbance.  Respiratory: Negative for cough, chest tightness and shortness of breath.   Cardiovascular: Negative for chest pain,  palpitations and leg swelling.  Gastrointestinal: Negative for nausea, vomiting, abdominal pain, diarrhea, constipation, blood in stool, abdominal distention and rectal pain.  Genitourinary: Negative for dysuria, urgency, frequency, hematuria, flank pain, vaginal bleeding, vaginal discharge, difficulty urinating, genital sores, vaginal pain, menstrual problem and pelvic pain.  Musculoskeletal: Positive for back pain. Negative for arthralgias.  Skin: Negative for rash.  Neurological: Negative for dizziness, syncope, speech difficulty, weakness, light-headedness, numbness and headaches.  Hematological: Negative for adenopathy. Does not bruise/bleed easily.  Psychiatric/Behavioral: Negative for suicidal ideas, behavioral problems, self-injury, dysphoric mood and agitation. The patient is not nervous/anxious.        Objective:   Physical Exam  Constitutional: She is oriented to person, place, and time. She appears well-developed and well-nourished.       Blood pressure 120/70 left arm 100/64 right arm  HENT:  Head: Normocephalic and atraumatic.  Right Ear: External ear normal.  Left Ear: External ear normal.  Mouth/Throat: Oropharynx is clear and moist.  Eyes: Conjunctivae and EOM are normal.  Neck: Normal range of motion. Neck supple. No JVD present. No thyromegaly present.  Cardiovascular: Normal rate, regular rhythm, normal heart sounds and intact distal pulses.   No murmur heard.      Dorsalis pedis pulses not easily palpable  Pulmonary/Chest: Effort normal and breath sounds normal. She has no wheezes. She has no rales.  Abdominal: Soft. Bowel sounds are normal. She exhibits no distension and no mass. There is no tenderness. There is no rebound and no guarding.  Musculoskeletal: Normal range of motion. She exhibits no edema and no tenderness.  Neurological: She is  alert and oriented to person, place, and time. She has normal reflexes. No cranial nerve deficit. She exhibits normal muscle  tone. Coordination normal.  Skin: Skin is warm and dry. No rash noted.  Psychiatric: She has a normal mood and affect. Her behavior is normal.          Assessment & Plan:   Exogenous obesity Preventive health examination Hypertension well controlled Dyslipidemia Hypothyroidism  Medical regimen unchanged. We'll recheck in 6 months Low salt diet regular exercise weight loss all encouraged

## 2011-11-22 NOTE — Patient Instructions (Signed)

## 2011-12-26 ENCOUNTER — Other Ambulatory Visit: Payer: Self-pay | Admitting: Internal Medicine

## 2011-12-26 MED ORDER — BECLOMETHASONE DIPROPIONATE 80 MCG/ACT IN AERS: 1.0000 | INHALATION_SPRAY | RESPIRATORY_TRACT | Status: DC | PRN

## 2011-12-26 MED ORDER — LEVOTHYROXINE SODIUM 75 MCG PO TABS: 75.0000 ug | ORAL_TABLET | Freq: Every day | ORAL | Status: DC

## 2011-12-26 MED ORDER — MONTELUKAST SODIUM 10 MG PO TABS: 10.0000 mg | ORAL_TABLET | Freq: Every day | ORAL | Status: DC

## 2011-12-26 NOTE — Telephone Encounter (Signed)
done

## 2011-12-26 NOTE — Telephone Encounter (Signed)
Pt need new rx qvar #3, generic singulair 10mg  #90 and levothroid 75 mcg#90 fax to [facility] [phone]

## 2012-01-30 ENCOUNTER — Other Ambulatory Visit: Payer: Self-pay

## 2012-01-30 MED ORDER — SOLIFENACIN SUCCINATE 10 MG PO TABS: 10.0000 mg | ORAL_TABLET | Freq: Every day | ORAL | Status: DC

## 2012-04-04 ENCOUNTER — Other Ambulatory Visit: Payer: Self-pay

## 2012-04-04 MED ORDER — BENAZEPRIL HCL 40 MG PO TABS: 40.0000 mg | ORAL_TABLET | Freq: Every day | ORAL | Status: DC

## 2012-04-10 ENCOUNTER — Telehealth: Payer: Self-pay | Admitting: *Deleted

## 2012-04-10 NOTE — Telephone Encounter (Signed)
Pt is calling complaining of "severe indigestion" x 3 days, and difficulty swallowing.  Advised to go the ER ASAP for evaluation and treatment.  Suspicious for MI.

## 2012-04-11 NOTE — Telephone Encounter (Signed)
Dr. Raliegh Ip made aware.

## 2012-05-22 ENCOUNTER — Encounter: Payer: Self-pay | Admitting: Internal Medicine

## 2012-05-22 ENCOUNTER — Ambulatory Visit (INDEPENDENT_AMBULATORY_CARE_PROVIDER_SITE_OTHER): Payer: BC Managed Care – PPO | Admitting: Internal Medicine

## 2012-05-22 VITALS — BP 120/70 | Temp 97.9°F | Wt 249.0 lb

## 2012-05-22 DIAGNOSIS — I10 Essential (primary) hypertension: Secondary | ICD-10-CM

## 2012-05-22 DIAGNOSIS — N259 Disorder resulting from impaired renal tubular function, unspecified: Secondary | ICD-10-CM

## 2012-05-22 DIAGNOSIS — Z87442 Personal history of urinary calculi: Secondary | ICD-10-CM

## 2012-05-22 NOTE — Progress Notes (Signed)
  Subjective:    Patient ID: [name], female    DOB: 1935-08-29, 77 y.o.   MRN: [id number]  HPI  77 year old patient who is seen today for followup of her hypertension. She has chronic kidney disease and is followed by nephrology. She has a followup ENT appointment tomorrow after a right-sided tympanoplasty tube was placed. She has asthma and allergic rhinitis. She has osteoarthritis and exogenous obesity. Her only new complaint is some episodes of dizziness and lightheadedness. Symptoms seem paroxysmal and not clearly orthostatic. She does not describe true vertigo. Symptoms have not recurred in about 3 weeks. Blood pressure today is in a low-normal range    Review of Systems  Constitutional: Negative.   HENT: Negative for hearing loss, congestion, sore throat, rhinorrhea, dental problem, sinus pressure and tinnitus.   Eyes: Negative for pain, discharge and visual disturbance.  Respiratory: Negative for cough and shortness of breath.   Cardiovascular: Negative for chest pain, palpitations and leg swelling.  Gastrointestinal: Negative for nausea, vomiting, abdominal pain, diarrhea, constipation, blood in stool and abdominal distention.  Genitourinary: Negative for dysuria, urgency, frequency, hematuria, flank pain, vaginal bleeding, vaginal discharge, difficulty urinating, vaginal pain and pelvic pain.  Musculoskeletal: Positive for back pain and arthralgias. Negative for joint swelling and gait problem.  Skin: Negative for rash.  Neurological: Positive for dizziness and light-headedness. Negative for syncope, speech difficulty, weakness, numbness and headaches.  Hematological: Negative for adenopathy.  Psychiatric/Behavioral: Negative for behavioral problems, dysphoric mood and agitation. The patient is not nervous/anxious.        Objective:   Physical Exam  Constitutional: She is oriented to person, place, and time. She appears well-developed and well-nourished.       Blood pressure  120/70  HENT:  Head: Normocephalic.  Right Ear: External ear normal.  Left Ear: External ear normal.  Mouth/Throat: Oropharynx is clear and moist.       Tympanoplasty tube on the right  Eyes: Conjunctivae and EOM are normal. Pupils are equal, round, and reactive to light.  Neck: Normal range of motion. Neck supple. No thyromegaly present.  Cardiovascular: Normal rate, regular rhythm, normal heart sounds and intact distal pulses.   Pulmonary/Chest: Effort normal and breath sounds normal.  Abdominal: Soft. Bowel sounds are normal. She exhibits no mass. There is no tenderness.  Musculoskeletal: Normal range of motion.  Lymphadenopathy:    She has no cervical adenopathy.  Neurological: She is alert and oriented to person, place, and time.  Skin: Skin is warm and dry. No rash noted.  Psychiatric: She has a normal mood and affect. Her behavior is normal.          Assessment & Plan:   Hypertension. We'll continue present regimen. If symptoms recur the patient has been told to discontinue diuretic therapy. She'll also be seen and followed by nephrology 7 Exogenous obesity. Weight loss encouraged Chronic kidney disease. Follow up nephrology   CPX 6 months

## 2012-05-22 NOTE — Patient Instructions (Signed)
Limit your sodium (Salt) intake  Return in 6 months for follow-up     It is important that you exercise regularly, at least 20 minutes 3 to 4 times per week.  If you develop chest pain or shortness of breath seek  medical attention.  You need to lose weight.  Consider a lower calorie diet and regular exercise.

## 2012-06-20 ENCOUNTER — Other Ambulatory Visit: Payer: Self-pay

## 2012-06-20 MED ORDER — SIMVASTATIN 20 MG PO TABS: 20.0000 mg | ORAL_TABLET | Freq: Every day | ORAL | Status: DC

## 2012-07-11 LAB — HM MAMMOGRAPHY: HM Mammogram: NEGATIVE

## 2012-07-12 ENCOUNTER — Encounter: Payer: Self-pay | Admitting: Internal Medicine

## 2012-08-02 ENCOUNTER — Other Ambulatory Visit: Payer: Self-pay

## 2012-08-02 MED ORDER — ESOMEPRAZOLE MAGNESIUM 40 MG PO CPDR: 40.0000 mg | DELAYED_RELEASE_CAPSULE | Freq: Every day | ORAL | Status: DC

## 2012-08-07 ENCOUNTER — Encounter: Payer: Self-pay | Admitting: Internal Medicine

## 2012-08-07 ENCOUNTER — Ambulatory Visit (INDEPENDENT_AMBULATORY_CARE_PROVIDER_SITE_OTHER): Payer: BC Managed Care – PPO | Admitting: Internal Medicine

## 2012-08-07 VITALS — BP 130/80 | Temp 98.3°F | Wt 239.0 lb

## 2012-08-07 DIAGNOSIS — N259 Disorder resulting from impaired renal tubular function, unspecified: Secondary | ICD-10-CM

## 2012-08-07 DIAGNOSIS — I10 Essential (primary) hypertension: Secondary | ICD-10-CM

## 2012-08-07 NOTE — Patient Instructions (Signed)
Drink as much fluid as you  can tolerate over the next few days  Discontinue hydrochlorothiazide  No blood pressure medications tomorrow  Resume Lotensin (benazepril) only on Thursday  Recheck 2 weeks

## 2012-08-07 NOTE — Progress Notes (Signed)
  Subjective:    Patient ID: [name], female    DOB: 08-14-1935, 77 y.o.   MRN: [id number]  HPI  77 year old patient who has hypertension presently on triple therapy. She has chronic kidney disease. For the past few months she has had some episodic dizziness. This has worsened recently and is associated with orthostatic changes. Last week she had a significant episode of dizziness that lasted approximately 2 minutes when she stood after prolonged sitting. Her husband has recently had an ACS  And both are on a much better diet she has lost 10 pounds in weight.    Review of Systems  HENT: Negative for hearing loss, congestion, sore throat, rhinorrhea, dental problem, sinus pressure and tinnitus.   Eyes: Negative for pain, discharge and visual disturbance.  Respiratory: Negative for cough and shortness of breath.   Cardiovascular: Negative for chest pain, palpitations and leg swelling.  Gastrointestinal: Negative for nausea, vomiting, abdominal pain, diarrhea, constipation, blood in stool and abdominal distention.  Genitourinary: Negative for dysuria, urgency, frequency, hematuria, flank pain, vaginal bleeding, vaginal discharge, difficulty urinating, vaginal pain and pelvic pain.  Musculoskeletal: Negative for joint swelling, arthralgias and gait problem.  Skin: Negative for rash.  Neurological: Positive for weakness and light-headedness. Negative for dizziness, syncope, speech difficulty, numbness and headaches.  Hematological: Negative for adenopathy.  Psychiatric/Behavioral: Negative for behavioral problems, dysphoric mood and agitation. The patient is not nervous/anxious.        Objective:   Physical Exam  Constitutional: She appears well-developed and well-nourished. No distress.       Blood pressure 110/64 sitting 90/55 standing          Assessment & Plan:   Orthostatic hypotension. Hydrochlorothiazide will be discontinued;  all blood pressure medications will be held for 24  hours. She has been asked to force fluids for the next few days. Lotensin only will be resumed in 24-48 hours. She'll be reassessed in 2 weeks.

## 2012-08-07 NOTE — Progress Notes (Signed)
  Subjective:    Patient ID: [name], female    DOB: 22-Jul-1935, 77 y.o.   MRN: [id number]  HPI  Wt Readings from Last 3 Encounters:  08/07/12 239 lb (108.41 kg)  05/22/12 249 lb (112.946 kg)  11/22/11 249 lb (112.946 kg)    Review of Systems     Objective:   Physical Exam        Assessment & Plan:

## 2012-08-21 ENCOUNTER — Encounter: Payer: Self-pay | Admitting: Internal Medicine

## 2012-08-21 ENCOUNTER — Ambulatory Visit (INDEPENDENT_AMBULATORY_CARE_PROVIDER_SITE_OTHER): Payer: BC Managed Care – PPO | Admitting: Internal Medicine

## 2012-08-21 VITALS — BP 128/80 | Temp 98.3°F | Wt 242.0 lb

## 2012-08-21 DIAGNOSIS — I10 Essential (primary) hypertension: Secondary | ICD-10-CM

## 2012-08-21 NOTE — Patient Instructions (Signed)
Limit your sodium (Salt) intake  Please check your blood pressure on a regular basis.  If it is consistently greater than 140/90, please make an office appointment.  Return in 3 months for follow-up

## 2012-08-21 NOTE — Progress Notes (Signed)
  Subjective:    Patient ID: [name], female    DOB: 1935-07-18, 77 y.o.   MRN: [id number]  HPI  77 year old patient who is seen today for her two-week followup. To his ago she was noted to have weakness dizziness and orthostatic hypotension. At the present time she is taking Lotensin and amlodipine and hydrochlorothiazide are both on hold. Today she feels well and her dizziness has resolved.    Review of Systems  HENT: Positive for congestion and rhinorrhea.        Objective:   Physical Exam  Constitutional: She appears well-developed and well-nourished. No distress.       Blood pressure 126/70          Assessment & Plan:    Hypertension. Well controlled will continue Lotensin only. Home blood pressure monitoring encouraged. She is scheduled for a physical in 3 months. Her blood pressure reassessed at that time. She'll call the office if blood pressure readings are consistently greater the 130/90

## 2012-09-12 ENCOUNTER — Ambulatory Visit (INDEPENDENT_AMBULATORY_CARE_PROVIDER_SITE_OTHER): Payer: Medicare Other

## 2012-09-12 DIAGNOSIS — Z23 Encounter for immunization: Secondary | ICD-10-CM

## 2012-10-26 ENCOUNTER — Ambulatory Visit (INDEPENDENT_AMBULATORY_CARE_PROVIDER_SITE_OTHER)
Admission: RE | Admit: 2012-10-26 | Discharge: 2012-10-26 | Disposition: A | Discharge status: Home / Self Care | Payer: Medicare Other | Source: Ambulatory Visit | Attending: Family Medicine | Admitting: Family Medicine

## 2012-10-26 ENCOUNTER — Telehealth: Payer: Self-pay | Admitting: Family Medicine

## 2012-10-26 ENCOUNTER — Ambulatory Visit (INDEPENDENT_AMBULATORY_CARE_PROVIDER_SITE_OTHER): Payer: Medicare Other | Admitting: Family Medicine

## 2012-10-26 ENCOUNTER — Encounter: Payer: Self-pay | Admitting: Family Medicine

## 2012-10-26 VITALS — BP 140/72 | HR 114 | Temp 98.1°F | Wt 234.0 lb

## 2012-10-26 DIAGNOSIS — R059 Cough, unspecified: Secondary | ICD-10-CM

## 2012-10-26 DIAGNOSIS — R05 Cough: Secondary | ICD-10-CM

## 2012-10-26 DIAGNOSIS — R509 Fever, unspecified: Secondary | ICD-10-CM

## 2012-10-26 DIAGNOSIS — M549 Dorsalgia, unspecified: Secondary | ICD-10-CM

## 2012-10-26 MED ORDER — MOXIFLOXACIN HCL 400 MG PO TABS: 400.0000 mg | ORAL_TABLET | Freq: Every day | ORAL | Status: DC

## 2012-10-26 NOTE — Telephone Encounter (Signed)
PNA on xray. With age and high fever 2 days ago will use moxi. Discussed tx and risks at appt. Please call abx to pharmacy of her choice and tell her to start this medicaiton today. If feeling worse or not getting better must be seen.

## 2012-10-26 NOTE — Telephone Encounter (Signed)
Called and spoke with pt and pt is aware.  Rx sent to [facility].  Pt is aware.

## 2012-10-26 NOTE — Patient Instructions (Addendum)
Please go to get the xray today - we will contact you if results are abnormal  Please use heat on your back for 15 minutes 2x daily and do the circled stretches on the exercise programs we gave you.You may use tylenol 500-1000mg  up to 2 times daily if needed.  Follow up with your doctor if worsening or not improving. Keep your appointment in a few weeks with your doctor and let him know if back pain not improving.

## 2012-10-26 NOTE — Progress Notes (Signed)
Chief Complaint  Patient presents with  . Back Pain    in the middle of back; no urinary symptoms    HPI:  Mid Back Pain on the R: -started about 4-6 weeks -pain is intermittent, achy, 5/10 pain -worse with: unknown -better with: certain positions, if she sits a certain way it feels better -can not think of an initiating event - but has been doing new pulling down and back exercises over last few months with curves and wonders if she pulled something -denies urinary symptoms, blood in urine, fevers, weight loss, CP, SOB, changes in bowel, nausea, vomiting or diarrhea  Cough and congestion: -for the last week -cough, congestion, fever to 102 a few nights ago, none since - seems to be improving -took her inhaler for the cough (uses this occ for asthma) -takes Singulair and zyrtec  ROS: See pertinent positives and negatives per HPI.  Past Medical History  Diagnosis Date  . ASTHMA 06/18/2007  . COLONIC POLYPS, HX OF 11/06/2009  . DYSPNEA ON EXERTION 05/06/2008  . GERD 06/18/2007  . HYPERCHOLESTEROLEMIA, PURE 07/09/2007  . HYPERTENSION 06/18/2007  . HYPOTHYROIDISM 06/18/2007  . NEPHROLITHIASIS, HX OF 08/07/2008  . OSTEOARTHRITIS 05/07/2008  . RENAL INSUFFICIENCY 05/06/2008  . Goiter   . Obesity   . Insomnia   . Renal cyst     Family History  Problem Relation Age of Onset  . Hypertension Neg Hx     family  . Cancer Neg Hx     colon ca , prostate ca    History   Social History  . Marital Status: Married    Spouse Name: N/A    Number of Children: N/A  . Years of Education: N/A   Social History Main Topics  . Smoking status: Former Smoker    Quit date: 12/19/1978  . Smokeless tobacco: Never Used  . Alcohol Use: Yes     Comment: rarely  . Drug Use: No  . Sexually Active: None   Other Topics Concern  . None   Social History Narrative  . None    Current outpatient prescriptions:amLODipine (NORVASC) 5 MG tablet, Take 1 tablet (5 mg total) by mouth daily., Disp: 90  tablet, Rfl: 3;  beclomethasone (QVAR) 80 MCG/ACT inhaler, Inhale 1 puff into the lungs as needed., Disp: 1 Inhaler, Rfl: 6;  benazepril (LOTENSIN) 40 MG tablet, Take 1 tablet (40 mg total) by mouth daily., Disp: 90 tablet, Rfl: 3 esomeprazole (NEXIUM) 40 MG capsule, Take 1 capsule (40 mg total) by mouth daily before breakfast., Disp: 90 capsule, Rfl: 3;  hydrochlorothiazide (HYDRODIURIL) 25 MG tablet, , Disp: , Rfl: ;  levothyroxine (SYNTHROID, LEVOTHROID) 75 MCG tablet, Take 1 tablet (75 mcg total) by mouth daily., Disp: 90 tablet, Rfl: 6;  montelukast (SINGULAIR) 10 MG tablet, Take 1 tablet (10 mg total) by mouth daily., Disp: 90 tablet, Rfl: 6 simvastatin (ZOCOR) 20 MG tablet, Take 1 tablet (20 mg total) by mouth at bedtime., Disp: 90 tablet, Rfl: 3;  solifenacin (VESICARE) 10 MG tablet, Take 1 tablet (10 mg total) by mouth daily., Disp: 90 tablet, Rfl: 3;  traMADol (ULTRAM) 50 MG tablet, Take 1 tablet (50 mg total) by mouth every 6 (six) hours as needed., Disp: 30 tablet, Rfl: 6 zolpidem (AMBIEN) 5 MG tablet, Take 1 tablet (5 mg total) by mouth at bedtime as needed., Disp: 30 tablet, Rfl: 3  EXAM:  Filed Vitals:   10/26/12 1329  BP: 140/72  Pulse: 114  Temp: 98.1 F (36.7 C)  There is no height on file to calculate BMI.  GENERAL: vitals reviewed and listed above, alert, oriented, appears well hydrated and in no acute distress  HEENT: atraumatic, conjunttiva clear, no obvious abnormalities on inspection of external nose and ears, normal appearance of ear canals and TMs, clear nasal congestion, mild post oropharyngeal erythema with PND, no tonsillar edema or exudate, no sinus TTP  NECK: no obvious masses on inspection  LUNGS: few rhonchi R base versus transmitted upper airway sounds  CV: HRRR, no peripheral edema  MS: moves all extremities without noticeable abnormality -TTP R mid back musculature - no bony TTP, neg winging of scapula, pain reproduced  PSYCH: pleasant and  cooperative, no obvious depression or anxiety  ASSESSMENT AND PLAN:  Discussed the following assessment and plan:  1. Back pain    2. Cough  DG Chest 2 View  3. Fever  DG Chest 2 View   -findings on exam c/w back strain as etiology of back pain - symptoms reproduced with palpation of soft tissues --> tx per below -abnormal lung exam and pt with 1 week hx of cough and fevers, though she feels like she is improving will obtain cxr to r/o pneumonia -Patient advised to return or notify a doctor immediately if symptoms worsen or persist or new concerns arise.  Patient Instructions  Please go to get the xray today - we will contact you if results are abnormal  Please use heat on your back for 15 minutes 2x daily and do the circled stretches on the exercise programs we gave you.You may use tylenol 500-1000mg  up to 2 times daily if needed.  Follow up with your doctor if worsening or not improving. Keep your appointment in a few weeks with your doctor and let him know if back pain not improving.     Colin Benton R.

## 2012-10-26 NOTE — Addendum Note (Signed)
Addended by: Colleen Can on: 10/26/2012 04:24 PM   Modules accepted: Orders

## 2012-10-26 NOTE — Telephone Encounter (Signed)
Left a message for pt to return call 

## 2012-11-08 ENCOUNTER — Other Ambulatory Visit: Payer: BC Managed Care – PPO

## 2012-11-09 ENCOUNTER — Other Ambulatory Visit (INDEPENDENT_AMBULATORY_CARE_PROVIDER_SITE_OTHER): Payer: Medicare Other

## 2012-11-09 DIAGNOSIS — I10 Essential (primary) hypertension: Secondary | ICD-10-CM

## 2012-11-09 DIAGNOSIS — Z0000 Encounter for general adult medical examination without abnormal findings: Secondary | ICD-10-CM

## 2012-11-09 LAB — BASIC METABOLIC PANEL
BUN: 25 mg/dL — ABNORMAL HIGH (ref 6–23)
CO2: 26 mEq/L (ref 19–32)
Calcium: 9.6 mg/dL (ref 8.4–10.5)
Creatinine, Ser: 1.8 mg/dL — ABNORMAL HIGH (ref 0.4–1.2)
Glucose, Bld: 99 mg/dL (ref 70–99)

## 2012-11-09 LAB — HEPATIC FUNCTION PANEL
Albumin: 3.2 g/dL — ABNORMAL LOW (ref 3.5–5.2)
Alkaline Phosphatase: 91 U/L (ref 39–117)
Bilirubin, Direct: 0.1 mg/dL (ref 0.0–0.3)

## 2012-11-09 LAB — CBC WITH DIFFERENTIAL/PLATELET
Basophils Absolute: 0 10*3/uL (ref 0.0–0.1)
Eosinophils Absolute: 0.2 10*3/uL (ref 0.0–0.7)
Lymphocytes Relative: 32.4 % (ref 12.0–46.0)
MCHC: 31.5 g/dL (ref 30.0–36.0)
Neutro Abs: 3.2 10*3/uL (ref 1.4–7.7)
Neutrophils Relative %: 57.3 % (ref 43.0–77.0)
RDW: 16.8 % — ABNORMAL HIGH (ref 11.5–14.6)

## 2012-11-09 LAB — LIPID PANEL
Cholesterol: 152 mg/dL (ref 0–200)
LDL Cholesterol: 72 mg/dL (ref 0–99)
Triglycerides: 165 mg/dL — ABNORMAL HIGH (ref 0.0–149.0)

## 2012-11-12 LAB — POCT URINALYSIS DIPSTICK
Glucose, UA: NEGATIVE
Spec Grav, UA: 1.015
Urobilinogen, UA: 0.2

## 2012-11-22 ENCOUNTER — Encounter: Payer: Self-pay | Admitting: Internal Medicine

## 2012-11-22 ENCOUNTER — Ambulatory Visit (INDEPENDENT_AMBULATORY_CARE_PROVIDER_SITE_OTHER): Payer: Medicare Other | Admitting: Internal Medicine

## 2012-11-22 VITALS — BP 140/84 | HR 88 | Temp 97.7°F | Resp 18 | Ht 64.5 in | Wt 235.0 lb

## 2012-11-22 DIAGNOSIS — Z86010 Personal history of colonic polyps: Secondary | ICD-10-CM

## 2012-11-22 DIAGNOSIS — E7800 Pure hypercholesterolemia, unspecified: Secondary | ICD-10-CM

## 2012-11-22 DIAGNOSIS — E039 Hypothyroidism, unspecified: Secondary | ICD-10-CM

## 2012-11-22 DIAGNOSIS — E669 Obesity, unspecified: Secondary | ICD-10-CM

## 2012-11-22 DIAGNOSIS — Z0000 Encounter for general adult medical examination without abnormal findings: Secondary | ICD-10-CM

## 2012-11-22 DIAGNOSIS — N259 Disorder resulting from impaired renal tubular function, unspecified: Secondary | ICD-10-CM

## 2012-11-22 DIAGNOSIS — I10 Essential (primary) hypertension: Secondary | ICD-10-CM

## 2012-11-22 MED ORDER — ZOLPIDEM TARTRATE 5 MG PO TABS: 5.0000 mg | ORAL_TABLET | Freq: Every evening | ORAL | Status: DC | PRN

## 2012-11-22 MED ORDER — SOLIFENACIN SUCCINATE 10 MG PO TABS: 10.0000 mg | ORAL_TABLET | Freq: Every day | ORAL | Status: DC

## 2012-11-22 MED ORDER — BECLOMETHASONE DIPROPIONATE 80 MCG/ACT IN AERS: 1.0000 | INHALATION_SPRAY | RESPIRATORY_TRACT | Status: DC | PRN

## 2012-11-22 MED ORDER — MONTELUKAST SODIUM 10 MG PO TABS: 10.0000 mg | ORAL_TABLET | Freq: Every day | ORAL | Status: DC

## 2012-11-22 MED ORDER — LEVOTHYROXINE SODIUM 75 MCG PO TABS: 75.0000 ug | ORAL_TABLET | Freq: Every day | ORAL | Status: DC

## 2012-11-22 MED ORDER — SIMVASTATIN 20 MG PO TABS: 20.0000 mg | ORAL_TABLET | Freq: Every day | ORAL | Status: DC

## 2012-11-22 MED ORDER — ESOMEPRAZOLE MAGNESIUM 40 MG PO CPDR: 40.0000 mg | DELAYED_RELEASE_CAPSULE | Freq: Every day | ORAL | Status: DC

## 2012-11-22 MED ORDER — BENAZEPRIL HCL 40 MG PO TABS: 40.0000 mg | ORAL_TABLET | Freq: Every day | ORAL | Status: DC

## 2012-11-22 MED ORDER — TRAMADOL HCL 50 MG PO TABS: 50.0000 mg | ORAL_TABLET | Freq: Four times a day (QID) | ORAL | Status: DC | PRN

## 2012-11-22 NOTE — Patient Instructions (Signed)

## 2012-11-22 NOTE — Progress Notes (Signed)
Patient ID: [name], female   DOB: 20-Jun-1935, 77 y.o.   MRN: [id number]  Subjective:    Patient ID: [name], female    DOB: 1935-07-23, 77 y.o.   MRN: [id number]  Hypertension Pertinent negatives include no chest pain, headaches, palpitations or shortness of breath.  77 year-old patient who is seen today for a preventative health examination. She is followed by renal medicine due to chronic kidney disease ; she has treated hypertension asthma  and dyslipidemia. She also has a history of colonic polyps. Due for followup colonoscopy in 2015  Allergies (verified):  No Known Drug Allergies   Past History:  Past Medical History:  Reviewed history from 11/06/2009 and no changes required.  Asthma  GERD  Hypertension  Hypothyroidism  Goiter  Benign tumor  obesity  insomnia  chronic kidney disease  Osteoarthritis  Nephrolithiasis, hx of  renal cysts  Colonic polyps, hx of   Past Surgical History:  Colonoscopy-10/14/2004  Hysterectomy with bladder tack, age 80  Tonsillectomy  Bilat Cataracts  Knee scope Percell Miller)  history carpal tunnel release  status post lithotripsy, fall 2009  s/p R TKR 03-2010  colonoscopy October 2005, Oct 2010   Family History:  Reviewed history from 10/27/2008 and no changes required.  father died age 87 MI. History prostate cancer  mother died age 40  one brother h/o of a stroke at age 62 due to a cerebral. aneurysm  one sister is well;  family history positive for hypertension, colon cancer, prostate cancer   Social History:  Reviewed history from 11/06/2009 and no changes required.  Married  Drug use-no  Never Smoked  Regular exercise-yes   1. Risk factors, based on past  M,S,F history- cardiovascular risk factors include hypertension and dyslipidemia 2.  Physical activities: Fairly sedentary due to obesity and arthritis no exercise limitations  3.  Depression/mood: No history of depression or mood disorder  4.  Hearing: No  deficits  5.  ADL's: Independent in all aspects of daily living  6.  Fall risk: Moderate due to 2 obesity  7.  Home safety: No problems identified  8.  Height weight, and visual acuity; height and weight stable weight is down modestly. No change in visual acuity  9.  Counseling: Further weight loss exercise all recommended heart healthy diet encouraged  10. Lab orders based on risk factors: Laboratory profile reviewed creatinine 1.8 and stable  11. Referral : Followup renal medicine. Will need colonoscopy in 2015  12. Care plan: Annual mammograms encouraged  13. Cognitive assessment: Alert and oriented normal affect. No cognitive dysfunction     Review of Systems  Constitutional: Negative for fever, appetite change, fatigue and unexpected weight change.  HENT: Negative for hearing loss, ear pain, nosebleeds, congestion, sore throat, mouth sores, trouble swallowing, neck stiffness, dental problem, voice change, sinus pressure and tinnitus.   Eyes: Negative for photophobia, pain, redness and visual disturbance.  Respiratory: Negative for cough, chest tightness and shortness of breath.   Cardiovascular: Negative for chest pain, palpitations and leg swelling.  Gastrointestinal: Negative for nausea, vomiting, abdominal pain, diarrhea, constipation, blood in stool, abdominal distention and rectal pain.  Genitourinary: Negative for dysuria, urgency, frequency, hematuria, flank pain, vaginal bleeding, vaginal discharge, difficulty urinating, genital sores, vaginal pain, menstrual problem and pelvic pain.  Musculoskeletal: Positive for back pain. Negative for arthralgias.  Skin: Negative for rash.  Neurological: Negative for dizziness, syncope, speech difficulty, weakness, light-headedness, numbness and headaches.  Hematological: Negative for adenopathy. Does not  bruise/bleed easily.  Psychiatric/Behavioral: Negative for suicidal ideas, behavioral problems, self-injury, dysphoric mood and  agitation. The patient is not nervous/anxious.        Objective:   Physical Exam  Constitutional: She is oriented to person, place, and time. She appears well-developed and well-nourished.       Blood pressure 120/70 left arm 100/64 right arm  HENT:  Head: Normocephalic and atraumatic.  Right Ear: External ear normal.  Left Ear: External ear normal.  Mouth/Throat: Oropharynx is clear and moist.  Eyes: Conjunctivae normal and EOM are normal.  Neck: Normal range of motion. Neck supple. No JVD present. No thyromegaly present.  Cardiovascular: Normal rate, regular rhythm, normal heart sounds and intact distal pulses.   No murmur heard.      Dorsalis pedis pulses not easily palpable  Pulmonary/Chest: Effort normal and breath sounds normal. She has no wheezes. She has no rales.  Abdominal: Soft. Bowel sounds are normal. She exhibits no distension and no mass. There is no tenderness. There is no rebound and no guarding.  Musculoskeletal: Normal range of motion. She exhibits no edema and no tenderness.  Neurological: She is alert and oriented to person, place, and time. She has normal reflexes. No cranial nerve deficit. She exhibits normal muscle tone. Coordination normal.  Skin: Skin is warm and dry. No rash noted.  Psychiatric: She has a normal mood and affect. Her behavior is normal.          Assessment & Plan:   Exogenous obesity Preventive health examination Hypertension well controlled Dyslipidemia Hypothyroidism  Medical regimen unchanged. We'll recheck in 6 months Low salt diet regular exercise weight loss all encouraged

## 2013-01-02 ENCOUNTER — Other Ambulatory Visit: Payer: Self-pay | Admitting: *Deleted

## 2013-01-02 ENCOUNTER — Other Ambulatory Visit: Payer: Self-pay | Admitting: Dermatology

## 2013-01-02 MED ORDER — MONTELUKAST SODIUM 10 MG PO TABS: 10.0000 mg | ORAL_TABLET | Freq: Every day | ORAL | Status: DC

## 2013-01-08 ENCOUNTER — Encounter: Payer: Self-pay | Admitting: Internal Medicine

## 2013-01-08 ENCOUNTER — Telehealth: Payer: Self-pay | Admitting: Internal Medicine

## 2013-01-08 ENCOUNTER — Ambulatory Visit (INDEPENDENT_AMBULATORY_CARE_PROVIDER_SITE_OTHER): Payer: Medicare Other | Admitting: Internal Medicine

## 2013-01-08 VITALS — BP 150/80 | HR 102 | Temp 98.6°F | Resp 20 | Wt 230.0 lb

## 2013-01-08 DIAGNOSIS — I10 Essential (primary) hypertension: Secondary | ICD-10-CM

## 2013-01-08 DIAGNOSIS — J45909 Unspecified asthma, uncomplicated: Secondary | ICD-10-CM

## 2013-01-08 MED ORDER — AZITHROMYCIN 250 MG PO TABS: ORAL_TABLET | ORAL | Status: DC

## 2013-01-08 MED ORDER — HYDROCODONE-HOMATROPINE 5-1.5 MG/5ML PO SYRP: 5.0000 mL | ORAL_SOLUTION | Freq: Four times a day (QID) | ORAL | Status: AC | PRN

## 2013-01-08 MED ORDER — ALBUTEROL SULFATE HFA 108 (90 BASE) MCG/ACT IN AERS: 2.0000 | INHALATION_SPRAY | Freq: Four times a day (QID) | RESPIRATORY_TRACT | Status: DC | PRN

## 2013-01-08 NOTE — Telephone Encounter (Signed)
Pt was here today and Dr. Raliegh Ip rx'd azithromycin (ZITHROMAX) 250 MG tablet He sent it in mail order.  It should have gone to [facility] on [address]. Please send in today so she can start the abx.

## 2013-01-08 NOTE — Progress Notes (Signed)
Subjective:    Patient ID: [name], female    DOB: September 21, 1935, 78 y.o.   MRN: [id number]  HPI  78 year old patient who has a history of asthma. For the past 4 weeks she has had refractory large and nonproductive cough. This has been associated with some chest congestion and occasional wheezing. Manus medication has included inhalational steroids but she has not been taking on a regular basis. She has treated hypertension. There has been occasional wheezing no productive cough. She states that she was treated for community acquired pneumonia in the fall of last year.  Past Medical History  Diagnosis Date  . ASTHMA 06/18/2007  . COLONIC POLYPS, HX OF 11/06/2009  . DYSPNEA ON EXERTION 05/06/2008  . GERD 06/18/2007  . HYPERCHOLESTEROLEMIA, PURE 07/09/2007  . HYPERTENSION 06/18/2007  . HYPOTHYROIDISM 06/18/2007  . NEPHROLITHIASIS, HX OF 08/07/2008  . OSTEOARTHRITIS 05/07/2008  . RENAL INSUFFICIENCY 05/06/2008  . Goiter   . Obesity   . Insomnia   . Renal cyst     History   Social History  . Marital Status: Married    Spouse Name: N/A    Number of Children: N/A  . Years of Education: N/A   Occupational History  . Not on file.   Social History Main Topics  . Smoking status: Former Smoker    Quit date: 12/19/1978  . Smokeless tobacco: Never Used  . Alcohol Use: Yes     Comment: rarely  . Drug Use: No  . Sexually Active: Not on file   Other Topics Concern  . Not on file   Social History Narrative  . No narrative on file    Past Surgical History  Procedure Date  . Abdominal hysterectomy   . Tonsillectomy   . Cataract extraction   . Knee arthroscopy   . Carpal tunnel release   . Total knee arthroplasty   . Lithotripsy     Family History  Problem Relation Age of Onset  . Hypertension Neg Hx     family  . Cancer Neg Hx     colon ca , prostate ca    No Known Allergies  Current Outpatient Prescriptions on File Prior to Visit  Medication Sig Dispense Refill  .  beclomethasone (QVAR) 80 MCG/ACT inhaler Inhale 1 puff into the lungs as needed.  1 Inhaler  6  . benazepril (LOTENSIN) 40 MG tablet Take 1 tablet (40 mg total) by mouth daily.  90 tablet  3  . esomeprazole (NEXIUM) 40 MG capsule Take 1 capsule (40 mg total) by mouth daily before breakfast.  90 capsule  3  . levothyroxine (SYNTHROID, LEVOTHROID) 75 MCG tablet Take 1 tablet (75 mcg total) by mouth daily.  90 tablet  6  . montelukast (SINGULAIR) 10 MG tablet Take 1 tablet (10 mg total) by mouth daily.  90 tablet  4  . simvastatin (ZOCOR) 20 MG tablet Take 1 tablet (20 mg total) by mouth at bedtime.  90 tablet  3  . solifenacin (VESICARE) 10 MG tablet Take 1 tablet (10 mg total) by mouth daily.  90 tablet  3  . traMADol (ULTRAM) 50 MG tablet Take 1 tablet (50 mg total) by mouth every 6 (six) hours as needed.  30 tablet  6  . zolpidem (AMBIEN) 5 MG tablet Take 1 tablet (5 mg total) by mouth at bedtime as needed.  30 tablet  3  . [DISCONTINUED] solifenacin (VESICARE) 10 MG tablet Take 1 tablet (10 mg total) by mouth daily.  90 tablet  3    BP 150/80  Pulse 102  Temp 98.6 F (37 C) (Oral)  Resp 20  Wt 230 lb (104.327 kg)  SpO2 93%       Review of Systems  Constitutional: Positive for fatigue.  HENT: Negative for hearing loss, congestion, sore throat, rhinorrhea, dental problem, sinus pressure and tinnitus.   Eyes: Negative for pain, discharge and visual disturbance.  Respiratory: Positive for cough, chest tightness and wheezing. Negative for shortness of breath.   Cardiovascular: Negative for chest pain, palpitations and leg swelling.  Gastrointestinal: Negative for nausea, vomiting, abdominal pain, diarrhea, constipation, blood in stool and abdominal distention.  Genitourinary: Negative for dysuria, urgency, frequency, hematuria, flank pain, vaginal bleeding, vaginal discharge, difficulty urinating, vaginal pain and pelvic pain.  Musculoskeletal: Negative for joint swelling, arthralgias  and gait problem.  Skin: Negative for rash.  Neurological: Negative for dizziness, syncope, speech difficulty, weakness, numbness and headaches.  Hematological: Negative for adenopathy.  Psychiatric/Behavioral: Negative for behavioral problems, dysphoric mood and agitation. The patient is not nervous/anxious.        Objective:   Physical Exam  Constitutional: She is oriented to person, place, and time. She appears well-developed and well-nourished.  HENT:  Head: Normocephalic.  Right Ear: External ear normal.  Left Ear: External ear normal.  Mouth/Throat: Oropharynx is clear and moist.  Eyes: Conjunctivae normal and EOM are normal. Pupils are equal, round, and reactive to light.  Neck: Normal range of motion. Neck supple. No thyromegaly present.  Cardiovascular: Normal rate, regular rhythm, normal heart sounds and intact distal pulses.   Pulmonary/Chest: Effort normal.       Scattered coarse rhonchi O2 saturation 96  Abdominal: Soft. Bowel sounds are normal. She exhibits no mass. There is no tenderness.  Musculoskeletal: Normal range of motion.  Lymphadenopathy:    She has no cervical adenopathy.  Neurological: She is alert and oriented to person, place, and time.  Skin: Skin is warm and dry. No rash noted.  Psychiatric: She has a normal mood and affect. Her behavior is normal.          Assessment & Plan:   Mild asthmatic bronchitis. The patient will resume ICS and was given a prescription for when necessary albuterol. Will treat with azithromycin expectorants Hypertension stable

## 2013-01-08 NOTE — Patient Instructions (Signed)
Take over-the-counter expectorants and cough medications such as  Mucinex DM.  Call if there is no improvement in 5 to 7 days or if he developed worsening cough, fever, or new symptoms, such as shortness of breath or chest pain. Take your antibiotic as prescribed until ALL of it is gone, but stop if you develop a rash, swelling, or any side effects of the medication.  Contact our office as soon as possible if  there are side effects of the medication.  Use Qvar/Pulmicort daily

## 2013-01-25 ENCOUNTER — Other Ambulatory Visit: Payer: Self-pay | Admitting: *Deleted

## 2013-01-25 MED ORDER — LEVOTHYROXINE SODIUM 75 MCG PO TABS: 75.0000 ug | ORAL_TABLET | Freq: Every day | ORAL | Status: DC

## 2013-02-05 ENCOUNTER — Other Ambulatory Visit: Payer: Self-pay | Admitting: *Deleted

## 2013-02-05 MED ORDER — SOLIFENACIN SUCCINATE 10 MG PO TABS: 10.0000 mg | ORAL_TABLET | Freq: Every day | ORAL | Status: DC

## 2013-05-23 ENCOUNTER — Ambulatory Visit: Payer: Medicare Other | Admitting: Internal Medicine

## 2013-05-28 ENCOUNTER — Ambulatory Visit: Payer: Medicare Other | Admitting: Internal Medicine

## 2013-05-29 ENCOUNTER — Ambulatory Visit (INDEPENDENT_AMBULATORY_CARE_PROVIDER_SITE_OTHER): Payer: Medicare Other | Admitting: Internal Medicine

## 2013-05-29 ENCOUNTER — Encounter: Payer: Self-pay | Admitting: Internal Medicine

## 2013-05-29 VITALS — BP 136/80 | HR 88 | Temp 99.0°F | Resp 20 | Wt 234.0 lb

## 2013-05-29 DIAGNOSIS — R531 Weakness: Secondary | ICD-10-CM

## 2013-05-29 DIAGNOSIS — E039 Hypothyroidism, unspecified: Secondary | ICD-10-CM

## 2013-05-29 DIAGNOSIS — R5381 Other malaise: Secondary | ICD-10-CM

## 2013-05-29 DIAGNOSIS — M1990 Unspecified osteoarthritis, unspecified site: Secondary | ICD-10-CM

## 2013-05-29 DIAGNOSIS — I10 Essential (primary) hypertension: Secondary | ICD-10-CM

## 2013-05-29 DIAGNOSIS — N259 Disorder resulting from impaired renal tubular function, unspecified: Secondary | ICD-10-CM

## 2013-05-29 LAB — CBC WITH DIFFERENTIAL/PLATELET
Basophils Relative: 0.4 % (ref 0.0–3.0)
Eosinophils Absolute: 0.1 10*3/uL (ref 0.0–0.7)
HCT: 34.4 % — ABNORMAL LOW (ref 36.0–46.0)
Hemoglobin: 11.4 g/dL — ABNORMAL LOW (ref 12.0–15.0)
MCHC: 33 g/dL (ref 30.0–36.0)
MCV: 79.3 fl (ref 78.0–100.0)
Monocytes Absolute: 0.5 10*3/uL (ref 0.1–1.0)
Neutro Abs: 3.7 10*3/uL (ref 1.4–7.7)
RBC: 4.34 Mil/uL (ref 3.87–5.11)

## 2013-05-29 LAB — COMPREHENSIVE METABOLIC PANEL
AST: 17 U/L (ref 0–37)
Alkaline Phosphatase: 88 U/L (ref 39–117)
BUN: 21 mg/dL (ref 6–23)
Calcium: 10.1 mg/dL (ref 8.4–10.5)
Chloride: 107 mEq/L (ref 96–112)
Creatinine, Ser: 1.8 mg/dL — ABNORMAL HIGH (ref 0.4–1.2)

## 2013-05-29 MED ORDER — DULOXETINE HCL 30 MG PO CPEP: 30.0000 mg | ORAL_CAPSULE | Freq: Every day | ORAL | Status: DC

## 2013-05-29 NOTE — Patient Instructions (Addendum)
It is important that you exercise regularly, at least 20 minutes 3 to 4 times per week.  If you develop chest pain or shortness of breath seek  medical attention.  Call or return to clinic prn if these symptoms worsen or fail to improve as anticipated.  

## 2013-05-30 ENCOUNTER — Encounter: Payer: Self-pay | Admitting: Internal Medicine

## 2013-05-30 NOTE — Progress Notes (Signed)
Subjective:    Patient ID: [name], female    DOB: Jan 02, 1935, 78 y.o.   MRN: [id number]  HPI  78 year old patient who is seen today in followup. Medical problems include hypertension chronic kidney disease osteoarthritis. Complaints today include some increasing fatigue and some dyspnea on exertion. These have been complaints in the past as well. She does have treated hypothyroidism and remains on supplemental levothyroxin. She has asthma which has been stable. No new concerns or complaints.  Past Medical History  Diagnosis Date  . ASTHMA 06/18/2007  . COLONIC POLYPS, HX OF 11/06/2009  . DYSPNEA ON EXERTION 05/06/2008  . GERD 06/18/2007  . HYPERCHOLESTEROLEMIA, PURE 07/09/2007  . HYPERTENSION 06/18/2007  . HYPOTHYROIDISM 06/18/2007  . NEPHROLITHIASIS, HX OF 08/07/2008  . OSTEOARTHRITIS 05/07/2008  . RENAL INSUFFICIENCY 05/06/2008  . Goiter   . Obesity   . Insomnia   . Renal cyst     History   Social History  . Marital Status: Married    Spouse Name: N/A    Number of Children: N/A  . Years of Education: N/A   Occupational History  . Not on file.   Social History Main Topics  . Smoking status: Former Smoker    Quit date: 12/19/1978  . Smokeless tobacco: Never Used  . Alcohol Use: Yes     Comment: rarely  . Drug Use: No  . Sexually Active: Not on file   Other Topics Concern  . Not on file   Social History Narrative  . No narrative on file    Past Surgical History  Procedure Laterality Date  . Abdominal hysterectomy    . Tonsillectomy    . Cataract extraction    . Knee arthroscopy    . Carpal tunnel release    . Total knee arthroplasty    . Lithotripsy      Family History  Problem Relation Age of Onset  . Hypertension Neg Hx     family  . Cancer Neg Hx     colon ca , prostate ca    No Known Allergies  Current Outpatient Prescriptions on File Prior to Visit  Medication Sig Dispense Refill  . albuterol (PROVENTIL HFA;VENTOLIN HFA) 108 (90 BASE) MCG/ACT  inhaler Inhale 2 puffs into the lungs every 6 (six) hours as needed for wheezing.  1 Inhaler  0  . beclomethasone (QVAR) 80 MCG/ACT inhaler Inhale 1 puff into the lungs as needed.  1 Inhaler  6  . benazepril (LOTENSIN) 40 MG tablet Take 1 tablet (40 mg total) by mouth daily.  90 tablet  3  . esomeprazole (NEXIUM) 40 MG capsule Take 1 capsule (40 mg total) by mouth daily before breakfast.  90 capsule  3  . levothyroxine (SYNTHROID, LEVOTHROID) 75 MCG tablet Take 1 tablet (75 mcg total) by mouth daily.  90 tablet  3  . montelukast (SINGULAIR) 10 MG tablet Take 1 tablet (10 mg total) by mouth daily.  90 tablet  4  . simvastatin (ZOCOR) 20 MG tablet Take 1 tablet (20 mg total) by mouth at bedtime.  90 tablet  3  . solifenacin (VESICARE) 10 MG tablet Take 1 tablet (10 mg total) by mouth daily.  90 tablet  3  . traMADol (ULTRAM) 50 MG tablet Take 1 tablet (50 mg total) by mouth every 6 (six) hours as needed.  30 tablet  6  . zolpidem (AMBIEN) 5 MG tablet Take 1 tablet (5 mg total) by mouth at bedtime as needed.  30 tablet  3  . [DISCONTINUED] solifenacin (VESICARE) 10 MG tablet Take 1 tablet (10 mg total) by mouth daily.  90 tablet  3   No current facility-administered medications on file prior to visit.    BP 136/80  Pulse 88  Temp(Src) 99 F (37.2 C) (Oral)  Resp 20  Wt 234 lb (106.142 kg)  BMI 39.56 kg/m2  SpO2 98%       Review of Systems  Constitutional: Positive for fatigue.  HENT: Negative for hearing loss, congestion, sore throat, rhinorrhea, dental problem, sinus pressure and tinnitus.   Eyes: Negative for pain, discharge and visual disturbance.  Respiratory: Positive for shortness of breath. Negative for cough.   Cardiovascular: Negative for chest pain, palpitations and leg swelling.  Gastrointestinal: Negative for nausea, vomiting, abdominal pain, diarrhea, constipation, blood in stool and abdominal distention.  Genitourinary: Negative for dysuria, urgency, frequency,  hematuria, flank pain, vaginal bleeding, vaginal discharge, difficulty urinating, vaginal pain and pelvic pain.  Musculoskeletal: Negative for joint swelling, arthralgias and gait problem.  Skin: Negative for rash.  Neurological: Negative for dizziness, syncope, speech difficulty, weakness, numbness and headaches.  Hematological: Negative for adenopathy.  Psychiatric/Behavioral: Negative for behavioral problems, dysphoric mood and agitation. The patient is not nervous/anxious.        Objective:   Physical Exam  Constitutional: She is oriented to person, place, and time. She appears well-developed and well-nourished.  HENT:  Head: Normocephalic.  Right Ear: External ear normal.  Left Ear: External ear normal.  Mouth/Throat: Oropharynx is clear and moist.  Eyes: Conjunctivae and EOM are normal. Pupils are equal, round, and reactive to light.  Neck: Normal range of motion. Neck supple. No thyromegaly present.  Cardiovascular: Normal rate, regular rhythm, normal heart sounds and intact distal pulses.   Pulmonary/Chest: Effort normal and breath sounds normal.  Abdominal: Soft. Bowel sounds are normal. She exhibits no mass. There is no tenderness.  Musculoskeletal: Normal range of motion.  Lymphadenopathy:    She has no cervical adenopathy.  Neurological: She is alert and oriented to person, place, and time.  Skin: Skin is warm and dry. No rash noted.  Psychiatric: She has a normal mood and affect. Her behavior is normal.          Assessment & Plan:   Hypertension stable Chronic kidney disease. In view of her increasing fatigue will recheck renal indices today. Followup nephrology Recheck here 4 months

## 2013-05-31 ENCOUNTER — Other Ambulatory Visit: Payer: Self-pay | Admitting: *Deleted

## 2013-05-31 MED ORDER — BENAZEPRIL HCL 40 MG PO TABS: 40.0000 mg | ORAL_TABLET | Freq: Every day | ORAL | Status: DC

## 2013-07-10 ENCOUNTER — Ambulatory Visit: Payer: Medicare Other | Admitting: Internal Medicine

## 2013-09-13 ENCOUNTER — Other Ambulatory Visit: Payer: Self-pay | Admitting: *Deleted

## 2013-09-13 MED ORDER — ESOMEPRAZOLE MAGNESIUM 40 MG PO CPDR: 40.0000 mg | DELAYED_RELEASE_CAPSULE | Freq: Every day | ORAL | Status: DC

## 2013-09-13 MED ORDER — SIMVASTATIN 20 MG PO TABS: 20.0000 mg | ORAL_TABLET | Freq: Every day | ORAL | Status: DC

## 2013-10-04 ENCOUNTER — Ambulatory Visit (INDEPENDENT_AMBULATORY_CARE_PROVIDER_SITE_OTHER): Payer: Medicare Other

## 2013-10-04 DIAGNOSIS — Z23 Encounter for immunization: Secondary | ICD-10-CM

## 2013-10-18 ENCOUNTER — Ambulatory Visit (INDEPENDENT_AMBULATORY_CARE_PROVIDER_SITE_OTHER): Payer: Medicare Other | Admitting: Internal Medicine

## 2013-10-18 ENCOUNTER — Encounter: Payer: Self-pay | Admitting: Internal Medicine

## 2013-10-18 VITALS — BP 140/80 | Temp 98.1°F | Wt 235.0 lb

## 2013-10-18 DIAGNOSIS — L02429 Furuncle of limb, unspecified: Secondary | ICD-10-CM

## 2013-10-18 DIAGNOSIS — L02439 Carbuncle of limb, unspecified: Secondary | ICD-10-CM

## 2013-10-18 DIAGNOSIS — I10 Essential (primary) hypertension: Secondary | ICD-10-CM

## 2013-10-18 NOTE — Patient Instructions (Signed)
Call or return to clinic prn if these symptoms worsen or fail to improve as anticipated.

## 2013-10-18 NOTE — Progress Notes (Signed)
Subjective:    Patient ID: [name], female    DOB: 02/26/1935, 78 y.o.   MRN: [id number]  HPI  78 year old patient who has a history of treated hypertension. She presents with a chief complaint of a painful nodule in the  right axillary region. Over the past 24 hours it has gotten smaller and much less tender. She has seen her eye doctor recently for a small sty involving her right eye area. Her blood pressure has been well-controlled No other concerns or complaints  Past Medical History  Diagnosis Date  . ASTHMA 06/18/2007  . COLONIC POLYPS, HX OF 11/06/2009  . DYSPNEA ON EXERTION 05/06/2008  . GERD 06/18/2007  . HYPERCHOLESTEROLEMIA, PURE 07/09/2007  . HYPERTENSION 06/18/2007  . HYPOTHYROIDISM 06/18/2007  . NEPHROLITHIASIS, HX OF 08/07/2008  . OSTEOARTHRITIS 05/07/2008  . RENAL INSUFFICIENCY 05/06/2008  . Goiter   . Obesity   . Insomnia   . Renal cyst     History   Social History  . Marital Status: Married    Spouse Name: N/A    Number of Children: N/A  . Years of Education: N/A   Occupational History  . Not on file.   Social History Main Topics  . Smoking status: Former Smoker    Quit date: 12/19/1978  . Smokeless tobacco: Never Used  . Alcohol Use: Yes     Comment: rarely  . Drug Use: No  . Sexual Activity: Not on file   Other Topics Concern  . Not on file   Social History Narrative  . No narrative on file    Past Surgical History  Procedure Laterality Date  . Abdominal hysterectomy    . Tonsillectomy    . Cataract extraction    . Knee arthroscopy    . Carpal tunnel release    . Total knee arthroplasty    . Lithotripsy      Family History  Problem Relation Age of Onset  . Hypertension Neg Hx     family  . Cancer Neg Hx     colon ca , prostate ca    No Known Allergies  Current Outpatient Prescriptions on File Prior to Visit  Medication Sig Dispense Refill  . albuterol (PROVENTIL HFA;VENTOLIN HFA) 108 (90 BASE) MCG/ACT inhaler Inhale 2 puffs  into the lungs every 6 (six) hours as needed for wheezing.  1 Inhaler  0  . beclomethasone (QVAR) 80 MCG/ACT inhaler Inhale 1 puff into the lungs as needed.  1 Inhaler  6  . benazepril (LOTENSIN) 40 MG tablet Take 1 tablet (40 mg total) by mouth daily.  90 tablet  3  . DULoxetine (CYMBALTA) 30 MG capsule Take 1 capsule (30 mg total) by mouth daily.  60 capsule  3  . esomeprazole (NEXIUM) 40 MG capsule Take 1 capsule (40 mg total) by mouth daily before breakfast.  90 capsule  3  . levothyroxine (SYNTHROID, LEVOTHROID) 75 MCG tablet Take 1 tablet (75 mcg total) by mouth daily.  90 tablet  3  . montelukast (SINGULAIR) 10 MG tablet Take 1 tablet (10 mg total) by mouth daily.  90 tablet  4  . simvastatin (ZOCOR) 20 MG tablet Take 1 tablet (20 mg total) by mouth at bedtime.  90 tablet  3  . solifenacin (VESICARE) 10 MG tablet Take 1 tablet (10 mg total) by mouth daily.  90 tablet  3  . traMADol (ULTRAM) 50 MG tablet Take 1 tablet (50 mg total) by mouth every 6 (six) hours as  needed.  30 tablet  6  . zolpidem (AMBIEN) 5 MG tablet Take 1 tablet (5 mg total) by mouth at bedtime as needed.  30 tablet  3  . [DISCONTINUED] solifenacin (VESICARE) 10 MG tablet Take 1 tablet (10 mg total) by mouth daily.  90 tablet  3   No current facility-administered medications on file prior to visit.    BP 140/80  Temp(Src) 98.1 F (36.7 C) (Oral)  Wt 235 lb (106.595 kg)  BMI 39.73 kg/m2     Review of Systems  Skin: Positive for wound.       Objective:   Physical Exam  Constitutional: She appears well-developed and well-nourished. No distress.  Repeat blood pressure 130/76  Skin:  1.5 cm resolving for on-call right axillary area          Assessment & Plan:    Resolving furuncle  right axillary area. We'll treat with warm compresses and observation. She was given a handwritten prescription for cephalexin since she will be leaving town. It is unlikely that she will need to get this filled or  used. Hypertension stable

## 2014-01-01 ENCOUNTER — Encounter: Payer: Self-pay | Admitting: Internal Medicine

## 2014-01-01 ENCOUNTER — Ambulatory Visit (INDEPENDENT_AMBULATORY_CARE_PROVIDER_SITE_OTHER): Payer: Medicare Other | Admitting: Internal Medicine

## 2014-01-01 VITALS — BP 150/86 | HR 96 | Temp 98.4°F | Resp 20 | Ht 64.5 in | Wt 239.0 lb

## 2014-01-01 DIAGNOSIS — J45909 Unspecified asthma, uncomplicated: Secondary | ICD-10-CM

## 2014-01-01 DIAGNOSIS — J069 Acute upper respiratory infection, unspecified: Secondary | ICD-10-CM

## 2014-01-01 DIAGNOSIS — B9789 Other viral agents as the cause of diseases classified elsewhere: Secondary | ICD-10-CM

## 2014-01-01 DIAGNOSIS — I10 Essential (primary) hypertension: Secondary | ICD-10-CM

## 2014-01-01 MED ORDER — HYDROCODONE-HOMATROPINE 5-1.5 MG/5ML PO SYRP: 5.0000 mL | ORAL_SOLUTION | Freq: Four times a day (QID) | ORAL | Status: AC | PRN

## 2014-01-01 NOTE — Patient Instructions (Signed)
Acute bronchitis symptoms are generally not helped by antibiotics.  Take over-the-counter expectorants and cough medications such as  Mucinex DM.  Call if there is no improvement in 5 to 7 days or if he developed worsening cough, fever, or new symptoms, such as shortness of breath or chest pain.

## 2014-01-01 NOTE — Progress Notes (Signed)
Pre-visit discussion using our clinic review tool. No additional management support is needed unless otherwise documented below in the visit note.  

## 2014-01-01 NOTE — Progress Notes (Signed)
Subjective:    Patient ID: [name], female    DOB: 1935-03-09, 79 y.o.   MRN: [id number]  HPI  79 year old patient who has a history of treated hypertension as well as asthma. She presents with a two-month history of largely nonproductive cough. She feels this initially started as a URI and has failed to improve. In general she feels well without productive cough. No fever. She has a history of asthma but no wheezing or albuterol use. Antihypertensive medications include ACE inhibition  Past Medical History  Diagnosis Date  . ASTHMA 06/18/2007  . COLONIC POLYPS, HX OF 11/06/2009  . DYSPNEA ON EXERTION 05/06/2008  . GERD 06/18/2007  . HYPERCHOLESTEROLEMIA, PURE 07/09/2007  . HYPERTENSION 06/18/2007  . HYPOTHYROIDISM 06/18/2007  . NEPHROLITHIASIS, HX OF 08/07/2008  . OSTEOARTHRITIS 05/07/2008  . RENAL INSUFFICIENCY 05/06/2008  . Goiter   . Obesity   . Insomnia   . Renal cyst     History   Social History  . Marital Status: Married    Spouse Name: N/A    Number of Children: N/A  . Years of Education: N/A   Occupational History  . Not on file.   Social History Main Topics  . Smoking status: Former Smoker    Quit date: 12/19/1978  . Smokeless tobacco: Never Used  . Alcohol Use: Yes     Comment: rarely  . Drug Use: No  . Sexual Activity: Not on file   Other Topics Concern  . Not on file   Social History Narrative  . No narrative on file    Past Surgical History  Procedure Laterality Date  . Abdominal hysterectomy    . Tonsillectomy    . Cataract extraction    . Knee arthroscopy    . Carpal tunnel release    . Total knee arthroplasty    . Lithotripsy      Family History  Problem Relation Age of Onset  . Hypertension Neg Hx     family  . Cancer Neg Hx     colon ca , prostate ca    No Known Allergies  Current Outpatient Prescriptions on File Prior to Visit  Medication Sig Dispense Refill  . albuterol (PROVENTIL HFA;VENTOLIN HFA) 108 (90 BASE) MCG/ACT  inhaler Inhale 2 puffs into the lungs every 6 (six) hours as needed for wheezing.  1 Inhaler  0  . beclomethasone (QVAR) 80 MCG/ACT inhaler Inhale 1 puff into the lungs as needed.  1 Inhaler  6  . benazepril (LOTENSIN) 40 MG tablet Take 1 tablet (40 mg total) by mouth daily.  90 tablet  3  . DULoxetine (CYMBALTA) 30 MG capsule Take 1 capsule (30 mg total) by mouth daily.  60 capsule  3  . esomeprazole (NEXIUM) 40 MG capsule Take 1 capsule (40 mg total) by mouth daily before breakfast.  90 capsule  3  . levothyroxine (SYNTHROID, LEVOTHROID) 75 MCG tablet Take 1 tablet (75 mcg total) by mouth daily.  90 tablet  3  . montelukast (SINGULAIR) 10 MG tablet Take 1 tablet (10 mg total) by mouth daily.  90 tablet  4  . simvastatin (ZOCOR) 20 MG tablet Take 1 tablet (20 mg total) by mouth at bedtime.  90 tablet  3  . solifenacin (VESICARE) 10 MG tablet Take 1 tablet (10 mg total) by mouth daily.  90 tablet  3  . traMADol (ULTRAM) 50 MG tablet Take 1 tablet (50 mg total) by mouth every 6 (six) hours as needed.  30 tablet  6  . zolpidem (AMBIEN) 5 MG tablet Take 1 tablet (5 mg total) by mouth at bedtime as needed.  30 tablet  3  . [DISCONTINUED] solifenacin (VESICARE) 10 MG tablet Take 1 tablet (10 mg total) by mouth daily.  90 tablet  3   No current facility-administered medications on file prior to visit.    BP 150/86  Pulse 96  Temp(Src) 98.4 F (36.9 C) (Oral)  Resp 20  Ht 5' 4.5" (1.638 m)  Wt 239 lb (108.41 kg)  BMI 40.41 kg/m2  SpO2 97%       Review of Systems  Constitutional: Negative.   HENT: Negative for congestion, dental problem, hearing loss, rhinorrhea, sinus pressure, sore throat and tinnitus.   Eyes: Negative for pain, discharge and visual disturbance.  Respiratory: Positive for cough. Negative for shortness of breath.   Cardiovascular: Negative for chest pain, palpitations and leg swelling.  Gastrointestinal: Negative for nausea, vomiting, abdominal pain, diarrhea,  constipation, blood in stool and abdominal distention.  Genitourinary: Negative for dysuria, urgency, frequency, hematuria, flank pain, vaginal bleeding, vaginal discharge, difficulty urinating, vaginal pain and pelvic pain.  Musculoskeletal: Negative for arthralgias, gait problem and joint swelling.  Skin: Negative for rash.  Neurological: Negative for dizziness, syncope, speech difficulty, weakness, numbness and headaches.  Hematological: Negative for adenopathy.  Psychiatric/Behavioral: Negative for behavioral problems, dysphoric mood and agitation. The patient is not nervous/anxious.        Objective:   Physical Exam  Constitutional: She is oriented to person, place, and time. She appears well-developed and well-nourished.  Blood pressure 140/74 on repeat  HENT:  Head: Normocephalic.  Right Ear: External ear normal.  Left Ear: External ear normal.  Mouth/Throat: Oropharynx is clear and moist.  Eyes: Conjunctivae and EOM are normal. Pupils are equal, round, and reactive to light.  Neck: Normal range of motion. Neck supple. No thyromegaly present.  Cardiovascular: Normal rate, regular rhythm, normal heart sounds and intact distal pulses.   Pulmonary/Chest: Effort normal and breath sounds normal. No respiratory distress. She has no wheezes. She has no rales.  Abdominal: Soft. Bowel sounds are normal. She exhibits no mass. There is no tenderness.  Musculoskeletal: Normal range of motion.  Lymphadenopathy:    She has no cervical adenopathy.  Neurological: She is alert and oriented to person, place, and time.  Skin: Skin is warm and dry. No rash noted.  Psychiatric: She has a normal mood and affect. Her behavior is normal.          Assessment & Plan:   Viral URI with cough. Persistent cough may be aggravated by ACE inhibition. We'll substitute Benicar for 4 weeks. We'll treat symptomatically Asthma stable Hypertension stable. Repeat blood pressure 140/74

## 2014-01-12 ENCOUNTER — Telehealth: Payer: Self-pay | Admitting: Internal Medicine

## 2014-01-12 NOTE — Telephone Encounter (Signed)
[facility] requesting refill of rosuvastatin (CRESTOR) 10 MG tablet

## 2014-01-12 NOTE — Telephone Encounter (Signed)
[facility] requesting refill of the following:  solifenacin (VESICARE) 10 MG tablet DULoxetine (CYMBALTA) 30 MG capsule  PLEASE DISREGARD PREVIOUS REQUEST FOR Rosuvastatin(Crestor) 10mg  tablet as this was entered in error.

## 2014-01-13 MED ORDER — SOLIFENACIN SUCCINATE 10 MG PO TABS: 10.0000 mg | ORAL_TABLET | Freq: Every day | ORAL | Status: AC

## 2014-01-13 MED ORDER — DULOXETINE HCL 30 MG PO CPEP: 30.0000 mg | ORAL_CAPSULE | Freq: Every day | ORAL | Status: DC

## 2014-01-13 NOTE — Telephone Encounter (Signed)
Rx sent to [facility].   

## 2014-01-22 ENCOUNTER — Telehealth: Payer: Self-pay | Admitting: Internal Medicine

## 2014-01-22 NOTE — Telephone Encounter (Signed)
Relevant patient education mailed to patient.  

## 2014-02-12 ENCOUNTER — Telehealth: Payer: Self-pay | Admitting: Internal Medicine

## 2014-02-12 MED ORDER — OLMESARTAN MEDOXOMIL 40 MG PO TABS: 40.0000 mg | ORAL_TABLET | Freq: Every day | ORAL | Status: DC

## 2014-02-12 NOTE — Telephone Encounter (Signed)
Rx sent to pharmacy   

## 2014-02-12 NOTE — Telephone Encounter (Signed)
Pt was given samples of benicar 40 mg. Pt needs new rx sent to [facility]

## 2014-03-04 ENCOUNTER — Telehealth: Payer: Self-pay | Admitting: Internal Medicine

## 2014-03-04 MED ORDER — LEVOTHYROXINE SODIUM 75 MCG PO TABS: 75.0000 ug | ORAL_TABLET | Freq: Every day | ORAL | Status: DC

## 2014-03-04 NOTE — Telephone Encounter (Signed)
[facility] - [place], [place] requesting re-fill on levothyroxine (SYNTHROID, LEVOTHROID) 75 MCG tablet

## 2014-04-10 ENCOUNTER — Telehealth: Payer: Self-pay | Admitting: Internal Medicine

## 2014-04-10 MED ORDER — MONTELUKAST SODIUM 10 MG PO TABS
10.0000 mg | ORAL_TABLET | Freq: Every day | ORAL | Status: DC
Start: 2014-04-10 — End: 2015-03-30

## 2014-04-10 NOTE — Telephone Encounter (Signed)
[facility] ([facility]IL ORDER[facility] - [place], [name] is requesting re-fill montelukast (SINGULAIR) 10 MG tablet

## 2014-04-10 NOTE — Telephone Encounter (Signed)
Rx sent to [facility].

## 2014-05-07 ENCOUNTER — Other Ambulatory Visit: Payer: Self-pay | Admitting: Internal Medicine

## 2014-07-01 ENCOUNTER — Encounter: Payer: Self-pay | Admitting: Internal Medicine

## 2014-07-01 ENCOUNTER — Ambulatory Visit (INDEPENDENT_AMBULATORY_CARE_PROVIDER_SITE_OTHER): Payer: Medicare Other | Admitting: Internal Medicine

## 2014-07-01 VITALS — BP 150/90 | HR 90 | Temp 98.1°F | Resp 20 | Ht 64.5 in | Wt 242.0 lb

## 2014-07-01 DIAGNOSIS — M1990 Unspecified osteoarthritis, unspecified site: Secondary | ICD-10-CM

## 2014-07-01 DIAGNOSIS — Z0000 Encounter for general adult medical examination without abnormal findings: Secondary | ICD-10-CM

## 2014-07-01 DIAGNOSIS — J45909 Unspecified asthma, uncomplicated: Secondary | ICD-10-CM

## 2014-07-01 DIAGNOSIS — I10 Essential (primary) hypertension: Secondary | ICD-10-CM

## 2014-07-01 DIAGNOSIS — N259 Disorder resulting from impaired renal tubular function, unspecified: Secondary | ICD-10-CM

## 2014-07-01 DIAGNOSIS — E039 Hypothyroidism, unspecified: Secondary | ICD-10-CM

## 2014-07-01 DIAGNOSIS — Z23 Encounter for immunization: Secondary | ICD-10-CM

## 2014-07-01 LAB — BASIC METABOLIC PANEL
BUN: 30 mg/dL — AB (ref 6–23)
CALCIUM: 10 mg/dL (ref 8.4–10.5)
CHLORIDE: 105 meq/L (ref 96–112)
CO2: 28 meq/L (ref 19–32)
CREATININE: 1.8 mg/dL — AB (ref 0.4–1.2)
GFR: 29.59 mL/min — ABNORMAL LOW (ref >60.00)
Glucose, Bld: 96 mg/dL (ref 70–99)
Potassium: 4.6 mEq/L (ref 3.5–5.1)
Sodium: 140 mEq/L (ref 135–145)

## 2014-07-01 MED ORDER — TRAMADOL HCL 50 MG PO TABS: 50.0000 mg | ORAL_TABLET | Freq: Four times a day (QID) | ORAL | Status: DC | PRN

## 2014-07-01 MED ORDER — OLMESARTAN MEDOXOMIL-HCTZ 40-12.5 MG PO TABS: 1.0000 | ORAL_TABLET | Freq: Every day | ORAL | Status: DC

## 2014-07-01 NOTE — Patient Instructions (Signed)

## 2014-07-01 NOTE — Progress Notes (Signed)
Subjective:    Patient ID: [name], female    DOB: 1935-12-02, 79 y.o.   MRN: [id number]  HPI 79 year old patient who is seen today for her biannual followup.  Medical problems include hypertension.  She states that blood pressure has not been well controlled with diastolics usually in the 0000000 range.  She has gained weight and has had some situational stress due to the poor health of her husband.  She has been eating out frequently She has obesity, osteoarthritis, and a history of colonic polyps. She has hypothyroidism. She remains on statin therapy for Dyslipidemia.  This mammogram and bone density.  Past Medical History  Diagnosis Date  . ASTHMA 06/18/2007  . COLONIC POLYPS, HX OF 11/06/2009  . DYSPNEA ON EXERTION 05/06/2008  . GERD 06/18/2007  . HYPERCHOLESTEROLEMIA, PURE 07/09/2007  . HYPERTENSION 06/18/2007  . HYPOTHYROIDISM 06/18/2007  . NEPHROLITHIASIS, HX OF 08/07/2008  . OSTEOARTHRITIS 05/07/2008  . RENAL INSUFFICIENCY 05/06/2008  . Goiter   . Obesity   . Insomnia   . Renal cyst     History   Social History  . Marital Status: Married    Spouse Name: N/A    Number of Children: N/A  . Years of Education: N/A   Occupational History  . Not on file.   Social History Main Topics  . Smoking status: Former Smoker    Quit date: 12/19/1978  . Smokeless tobacco: Never Used  . Alcohol Use: Yes     Comment: rarely  . Drug Use: No  . Sexual Activity: Not on file   Other Topics Concern  . Not on file   Social History Narrative  . No narrative on file    Past Surgical History  Procedure Laterality Date  . Abdominal hysterectomy    . Tonsillectomy    . Cataract extraction    . Knee arthroscopy    . Carpal tunnel release    . Total knee arthroplasty    . Lithotripsy      Family History  Problem Relation Age of Onset  . Hypertension Neg Hx     family  . Cancer Neg Hx     colon ca , prostate ca    No Known Allergies  Current Outpatient Prescriptions on  File Prior to Visit  Medication Sig Dispense Refill  . albuterol (PROVENTIL HFA;VENTOLIN HFA) 108 (90 BASE) MCG/ACT inhaler Inhale 2 puffs into the lungs every 6 (six) hours as needed for wheezing.  1 Inhaler  0  . beclomethasone (QVAR) 80 MCG/ACT inhaler Inhale 1 puff into the lungs as needed.  1 Inhaler  6  . benazepril (LOTENSIN) 40 MG tablet Take 1 tablet (40 mg total) by mouth daily.  90 tablet  3  . BENICAR 40 MG tablet TAKE 1 TABLET BY MOUTH DAILY  90 tablet  0  . DULoxetine (CYMBALTA) 30 MG capsule Take 1 capsule (30 mg total) by mouth daily.  90 capsule  3  . esomeprazole (NEXIUM) 40 MG capsule Take 1 capsule (40 mg total) by mouth daily before breakfast.  90 capsule  3  . levothyroxine (SYNTHROID, LEVOTHROID) 75 MCG tablet Take 1 tablet (75 mcg total) by mouth daily.  90 tablet  3  . montelukast (SINGULAIR) 10 MG tablet Take 1 tablet (10 mg total) by mouth daily.  90 tablet  3  . simvastatin (ZOCOR) 20 MG tablet Take 1 tablet (20 mg total) by mouth at bedtime.  90 tablet  3  . solifenacin (VESICARE)  10 MG tablet Take 1 tablet (10 mg total) by mouth daily.  90 tablet  3  . zolpidem (AMBIEN) 5 MG tablet Take 1 tablet (5 mg total) by mouth at bedtime as needed.  30 tablet  3  . [DISCONTINUED] solifenacin (VESICARE) 10 MG tablet Take 1 tablet (10 mg total) by mouth daily.  90 tablet  3   No current facility-administered medications on file prior to visit.    BP 150/90  Pulse 90  Temp(Src) 98.1 F (36.7 C) (Oral)  Resp 20  Ht 5' 4.5" (1.638 m)  Wt 242 lb (109.77 kg)  BMI 40.91 kg/m2  SpO2 96%     Review of Systems  Constitutional: Negative.   HENT: Negative for congestion, dental problem, hearing loss, rhinorrhea, sinus pressure, sore throat and tinnitus.   Eyes: Negative for pain, discharge and visual disturbance.  Respiratory: Negative for cough and shortness of breath.   Cardiovascular: Negative for chest pain, palpitations and leg swelling.  Gastrointestinal: Negative  for nausea, vomiting, abdominal pain, diarrhea, constipation, blood in stool and abdominal distention.  Genitourinary: Negative for dysuria, urgency, frequency, hematuria, flank pain, vaginal bleeding, vaginal discharge, difficulty urinating, vaginal pain and pelvic pain.  Musculoskeletal: Negative for arthralgias, gait problem and joint swelling.  Skin: Negative for rash.  Neurological: Negative for dizziness, syncope, speech difficulty, weakness, numbness and headaches.  Hematological: Negative for adenopathy.  Psychiatric/Behavioral: Negative for behavioral problems, dysphoric mood and agitation. The patient is nervous/anxious.        Objective:   Physical Exam  Constitutional: She is oriented to person, place, and time. She appears well-developed and well-nourished.  Blood pressure 140/90 Obese  HENT:  Head: Normocephalic.  Right Ear: External ear normal.  Left Ear: External ear normal.  Mouth/Throat: Oropharynx is clear and moist.  Eyes: Conjunctivae and EOM are normal. Pupils are equal, round, and reactive to light.  Neck: Normal range of motion. Neck supple. No thyromegaly present.  Cardiovascular: Normal rate, regular rhythm, normal heart sounds and intact distal pulses.   Pulmonary/Chest: Effort normal and breath sounds normal.  Abdominal: Soft. Bowel sounds are normal. She exhibits no mass. There is no tenderness.  Musculoskeletal: Normal range of motion.  Lymphadenopathy:    She has no cervical adenopathy.  Neurological: She is alert and oriented to person, place, and time.  Skin: Skin is warm and dry. No rash noted.  Psychiatric: She has a normal mood and affect. Her behavior is normal.          Assessment & Plan:   Hypertension.  We'll add hydrochlorothiazide 12 point 5 mg Obesity weight loss encouraged Hypothyroidism History of colonic polyps  Schedule mammogram and bone density  CPX 6 months

## 2014-07-01 NOTE — Progress Notes (Signed)
Pre visit review using our clinic review tool, if applicable. No additional management support is needed unless otherwise documented below in the visit note. 

## 2014-07-02 ENCOUNTER — Ambulatory Visit (INDEPENDENT_AMBULATORY_CARE_PROVIDER_SITE_OTHER)
Admission: RE | Admit: 2014-07-02 | Discharge: 2014-07-02 | Disposition: A | Discharge status: Home / Self Care | Payer: Medicare Other | Source: Ambulatory Visit | Attending: Internal Medicine | Admitting: Internal Medicine

## 2014-07-02 DIAGNOSIS — Z0000 Encounter for general adult medical examination without abnormal findings: Secondary | ICD-10-CM

## 2014-07-22 LAB — HM MAMMOGRAPHY

## 2014-09-12 ENCOUNTER — Ambulatory Visit (INDEPENDENT_AMBULATORY_CARE_PROVIDER_SITE_OTHER): Payer: Medicare Other

## 2014-09-12 DIAGNOSIS — Z23 Encounter for immunization: Secondary | ICD-10-CM

## 2014-09-17 ENCOUNTER — Telehealth: Payer: Self-pay | Admitting: Internal Medicine

## 2014-09-17 MED ORDER — SIMVASTATIN 20 MG PO TABS: 20.0000 mg | ORAL_TABLET | Freq: Every day | ORAL | Status: DC

## 2014-09-17 MED ORDER — ESOMEPRAZOLE MAGNESIUM 40 MG PO CPDR: 40.0000 mg | DELAYED_RELEASE_CAPSULE | Freq: Every day | ORAL | Status: DC

## 2014-09-17 NOTE — Telephone Encounter (Signed)
[facility] [place], [place] is requesting re-fills on the following: simvastatin (ZOCOR) 20 MG tablet esomeprazole (NEXIUM) 40 MG capsule

## 2014-09-17 NOTE — Telephone Encounter (Signed)
Rxs sent

## 2014-10-21 ENCOUNTER — Telehealth: Payer: Self-pay | Admitting: Internal Medicine

## 2014-10-21 NOTE — Telephone Encounter (Signed)
Pt still has benazepril at home and would like to know should she been taking med

## 2014-10-21 NOTE — Telephone Encounter (Signed)
Please advise 

## 2014-10-23 NOTE — Telephone Encounter (Signed)
Pt called back to check the status of the below question. She's keeps having dizzy spells.

## 2014-10-23 NOTE — Telephone Encounter (Signed)
Spoke to pt, told her she should only be taking one blood pressure medication. Asked pt which medication is she taking? Pt said she is taking Benicar HCT, she found the Benazapril and didn't know if she should be taking that too.and had a dizzy spell. Told pt just continue blood pressure medication she is on and that she needs to monitor blood pressure and if dizzy spells continue need to make an appointment. Pt verbalized understanding.

## 2014-11-05 ENCOUNTER — Other Ambulatory Visit: Payer: Self-pay | Admitting: Gastroenterology

## 2014-12-22 ENCOUNTER — Other Ambulatory Visit: Payer: Self-pay | Admitting: Internal Medicine

## 2014-12-25 ENCOUNTER — Other Ambulatory Visit (INDEPENDENT_AMBULATORY_CARE_PROVIDER_SITE_OTHER): Payer: Medicare Other

## 2014-12-25 DIAGNOSIS — Z0000 Encounter for general adult medical examination without abnormal findings: Secondary | ICD-10-CM

## 2014-12-25 DIAGNOSIS — E039 Hypothyroidism, unspecified: Secondary | ICD-10-CM

## 2014-12-25 LAB — CBC WITH DIFFERENTIAL/PLATELET
Basophils Absolute: 0 10*3/uL (ref 0.0–0.1)
Basophils Relative: 0.4 % (ref 0.0–3.0)
Eosinophils Absolute: 0.2 10*3/uL (ref 0.0–0.7)
Eosinophils Relative: 2.9 % (ref 0.0–5.0)
HEMATOCRIT: 38.5 % (ref 36.0–46.0)
Hemoglobin: 12.4 g/dL (ref 12.0–15.0)
LYMPHS ABS: 2.1 10*3/uL (ref 0.7–4.0)
Lymphocytes Relative: 27.4 % (ref 12.0–46.0)
MCHC: 32.3 g/dL (ref 30.0–36.0)
MCV: 88.1 fl (ref 78.0–100.0)
Monocytes Absolute: 0.6 10*3/uL (ref 0.1–1.0)
Monocytes Relative: 7.7 % (ref 3.0–12.0)
Neutro Abs: 4.7 10*3/uL (ref 1.4–7.7)
Neutrophils Relative %: 61.6 % (ref 43.0–77.0)
PLATELETS: 228 10*3/uL (ref 150.0–400.0)
RBC: 4.37 Mil/uL (ref 3.87–5.11)
RDW: 15.2 % (ref 11.5–15.5)
WBC: 7.6 10*3/uL (ref 4.0–10.5)

## 2014-12-25 LAB — LIPID PANEL
Cholesterol: 162 mg/dL (ref 0–200)
HDL: 46.4 mg/dL (ref >39.00)
LDL Cholesterol: 78 mg/dL (ref 0–99)
NonHDL: 115.6
Total CHOL/HDL Ratio: 3
Triglycerides: 189 mg/dL — ABNORMAL HIGH (ref 0.0–149.0)
VLDL: 37.8 mg/dL (ref 0.0–40.0)

## 2014-12-25 LAB — COMPREHENSIVE METABOLIC PANEL
ALK PHOS: 91 U/L (ref 39–117)
ALT: 17 U/L (ref 0–35)
AST: 19 U/L (ref 0–37)
Albumin: 3.2 g/dL — ABNORMAL LOW (ref 3.5–5.2)
BILIRUBIN TOTAL: 0.4 mg/dL (ref 0.2–1.2)
BUN: 26 mg/dL — ABNORMAL HIGH (ref 6–23)
CO2: 27 mEq/L (ref 19–32)
Calcium: 9.4 mg/dL (ref 8.4–10.5)
Chloride: 105 mEq/L (ref 96–112)
Creatinine, Ser: 1.9 mg/dL — ABNORMAL HIGH (ref 0.4–1.2)
GFR: 27.39 mL/min — ABNORMAL LOW (ref >60.00)
Glucose, Bld: 91 mg/dL (ref 70–99)
POTASSIUM: 4.3 meq/L (ref 3.5–5.1)
Sodium: 140 mEq/L (ref 135–145)
Total Protein: 6.3 g/dL (ref 6.0–8.3)

## 2014-12-25 LAB — TSH: TSH: 6.31 u[IU]/mL — ABNORMAL HIGH (ref 0.35–4.50)

## 2015-01-01 ENCOUNTER — Encounter: Payer: Self-pay | Admitting: Internal Medicine

## 2015-01-01 ENCOUNTER — Ambulatory Visit (INDEPENDENT_AMBULATORY_CARE_PROVIDER_SITE_OTHER): Payer: Medicare Other | Admitting: Internal Medicine

## 2015-01-01 ENCOUNTER — Encounter: Payer: Self-pay | Admitting: *Deleted

## 2015-01-01 VITALS — BP 140/88 | HR 96 | Temp 97.8°F | Resp 20 | Ht 64.5 in | Wt 241.0 lb

## 2015-01-01 DIAGNOSIS — E7800 Pure hypercholesterolemia, unspecified: Secondary | ICD-10-CM

## 2015-01-01 DIAGNOSIS — N259 Disorder resulting from impaired renal tubular function, unspecified: Secondary | ICD-10-CM

## 2015-01-01 DIAGNOSIS — E669 Obesity, unspecified: Secondary | ICD-10-CM

## 2015-01-01 DIAGNOSIS — Z0000 Encounter for general adult medical examination without abnormal findings: Secondary | ICD-10-CM | POA: Diagnosis not present

## 2015-01-01 DIAGNOSIS — E039 Hypothyroidism, unspecified: Secondary | ICD-10-CM

## 2015-01-01 DIAGNOSIS — Z860100 Personal history of colon polyps, unspecified: Secondary | ICD-10-CM

## 2015-01-01 DIAGNOSIS — I10 Essential (primary) hypertension: Secondary | ICD-10-CM | POA: Diagnosis not present

## 2015-01-01 LAB — POCT URINALYSIS DIPSTICK
Bilirubin, UA: NEGATIVE
Glucose, UA: NEGATIVE
KETONES UA: NEGATIVE
Nitrite, UA: POSITIVE
PH UA: 7
Protein, UA: NEGATIVE
Spec Grav, UA: 1.01
UROBILINOGEN UA: 0.2

## 2015-01-01 NOTE — Progress Notes (Signed)
Pre visit review using our clinic review tool, if applicable. No additional management support is needed unless otherwise documented below in the visit note. 

## 2015-01-01 NOTE — Progress Notes (Signed)
Patient ID: [name], female   DOB: 31-May-1935, 80 y.o.   MRN: [id number]  Subjective:    Patient ID: [name], female    DOB: [birth year], 80 y.o.   MRN: [id number]  Hypertension Pertinent negatives include no chest pain, headaches, palpitations or shortness of breath.    80 year-old patient who is seen today for a preventative health examination.   She is followed by renal medicine due to chronic kidney disease ; she has treated hypertension asthma  and dyslipidemia. She also has a history of colonic polyps. Due for followup colonoscopy in 2018.  Follow-up colonoscopy 2015 revealed 5 polyps  Social history-  considerable situational stress due to the poor health of her husband  Allergies (verified):  No Known Drug Allergies   Past History:  Past Medical History:   Asthma  GERD  Hypertension  Hypothyroidism  Goiter  Benign tumor  obesity  insomnia  chronic kidney disease  Osteoarthritis  Nephrolithiasis, hx of  renal cysts  Colonic polyps, hx of   Past Surgical History:  Colonoscopy-10/14/2004  Hysterectomy with bladder tack, age 80  Tonsillectomy  Bilat Cataracts  Knee scope Percell Miller)  history carpal tunnel release  status post lithotripsy, fall 2009  s/p R TKR 03-2010  colonoscopy October 2005, Oct 2010   Family History:   father died age 78 MI. History prostate cancer  mother died age 78  one brother h/o of a stroke at age 78 due to a cerebral. aneurysm  one sister is well;  family history positive for hypertension, colon cancer, prostate cancer   Social History:   Married  Drug use-no  Never Smoked  Regular exercise-yes   1. Risk factors, based on past  M,S,F history- cardiovascular risk factors include hypertension and dyslipidemia 2.  Physical activities: Fairly sedentary due to obesity and arthritis no exercise limitations  3.  Depression/mood: No history of depression or mood disorder  4.  Hearing: No deficits  5.  ADL's: Independent in all  aspects of daily living  6.  Fall risk: Moderate due to 2 obesity  7.  Home safety: No problems identified  8.  Height weight, and visual acuity; height and weight stable weight is down modestly. No change in visual acuity  9.  Counseling: Further weight loss exercise all recommended heart healthy diet encouraged  10. Lab orders based on risk factors: Laboratory profile reviewed creatinine 1.9  and stable  11. Referral : Followup renal medicine. Will need colonoscopy in 2018  12. Care plan: Annual mammograms encouraged  13. Cognitive assessment: Alert and oriented normal affect. No cognitive dysfunction  14.  Preventive services will include the [facility] mammograms as well as periodic colonoscopies.  She will continue to be seen by renal medicine at least annually in by primary care annually. Patient was provided with a written and personalized care plan  15.  Provider list includes, ophthalmology primary care renal medicine and GI     Review of Systems  Constitutional: Negative for fever, appetite change, fatigue and unexpected weight change.  HENT: Negative for congestion, dental problem, ear pain, hearing loss, mouth sores, nosebleeds, sinus pressure, sore throat, tinnitus, trouble swallowing and voice change.   Eyes: Negative for photophobia, pain, redness and visual disturbance.  Respiratory: Negative for cough, chest tightness and shortness of breath.   Cardiovascular: Negative for chest pain, palpitations and leg swelling.  Gastrointestinal: Negative for nausea, vomiting, abdominal pain, diarrhea, constipation, blood in stool, abdominal distention and rectal pain.  Genitourinary:  Negative for dysuria, urgency, frequency, hematuria, flank pain, vaginal bleeding, vaginal discharge, difficulty urinating, genital sores, vaginal pain, menstrual problem and pelvic pain.  Musculoskeletal: Positive for back pain. Negative for arthralgias and neck stiffness.  Skin: Negative for rash.   Neurological: Negative for dizziness, syncope, speech difficulty, weakness, light-headedness, numbness and headaches.  Hematological: Negative for adenopathy. Does not bruise/bleed easily.  Psychiatric/Behavioral: Negative for suicidal ideas, behavioral problems, self-injury, dysphoric mood and agitation. The patient is not nervous/anxious.        Objective:   Physical Exam  Constitutional: She is oriented to person, place, and time. She appears well-developed and well-nourished.  Blood pressure 120/70 left arm   HENT:  Head: Normocephalic and atraumatic.  Right Ear: External ear normal.  Left Ear: External ear normal.  Mouth/Throat: Oropharynx is clear and moist.  Eyes: Conjunctivae and EOM are normal.  Neck: Normal range of motion. Neck supple. No JVD present. No thyromegaly present.  Thyroidectomy scar  Cardiovascular: Normal rate, regular rhythm, normal heart sounds and intact distal pulses.   No murmur heard. Dorsalis pedis pulses not easily palpable  Pulmonary/Chest: Effort normal and breath sounds normal. She has no wheezes. She has no rales.  Abdominal: Soft. Bowel sounds are normal. She exhibits no distension and no mass. There is no tenderness. There is no rebound and no guarding.  Musculoskeletal: Normal range of motion. She exhibits no edema or tenderness.  Neurological: She is alert and oriented to person, place, and time. She has normal reflexes. No cranial nerve deficit. She exhibits normal muscle tone. Coordination normal.  Skin: Skin is warm and dry. No rash noted.  Status post bilateral total knee replacement surgery scars  Psychiatric: She has a normal mood and affect. Her behavior is normal.          Assessment & Plan:   Exogenous obesity Preventive health examination Hypertension well controlled Dyslipidemia Hypothyroidism.  We'll increase levothyroxine to 88 g daily Colonic polyps.  Follow-up colonoscopy in 3 years  Medical regimen unchanged. We'll  recheck in 6 months Low salt diet regular exercise weight loss all encouraged

## 2015-01-01 NOTE — Patient Instructions (Signed)
Limit your sodium (Salt) intake    It is important that you exercise regularly, at least 20 minutes 3 to 4 times per week.  If you develop chest pain or shortness of breath seek  medical attention.  You need to lose weight.  Consider a lower calorie diet and regular exercise.  Health Maintenance Adopting a healthy lifestyle and getting preventive care can go a long way to promote health and wellness. Talk with your health care provider about what schedule of regular examinations is right for you. This is a good chance for you to check in with your provider about disease prevention and staying healthy. In between checkups, there are plenty of things you can do on your own. Experts have done a lot of research about which lifestyle changes and preventive measures are most likely to keep you healthy. Ask your health care provider for more information. WEIGHT AND DIET  Eat a healthy diet  Be sure to include plenty of vegetables, fruits, low-fat dairy products, and lean protein.  Do not eat a lot of foods high in solid fats, added sugars, or salt.  Get regular exercise. This is one of the most important things you can do for your health.  Most adults should exercise for at least 150 minutes each week. The exercise should increase your heart rate and make you sweat (moderate-intensity exercise).  Most adults should also do strengthening exercises at least twice a week. This is in addition to the moderate-intensity exercise.  Maintain a healthy weight  Body mass index (BMI) is a measurement that can be used to identify possible weight problems. It estimates body fat based on height and weight. Your health care provider can help determine your BMI and help you achieve or maintain a healthy weight.  For females 20 years of age and older:   A BMI below 18.5 is considered underweight.  A BMI of 18.5 to 24.9 is normal.  A BMI of 25 to 29.9 is considered overweight.  A BMI of 30 and above is  considered obese.  Watch levels of cholesterol and blood lipids  You should start having your blood tested for lipids and cholesterol at 78 years of age, then have this test every 5 years.  You may need to have your cholesterol levels checked more often if:  Your lipid or cholesterol levels are high.  You are older than 78 years of age.  You are at high risk for heart disease.  CANCER SCREENING   Lung Cancer  Lung cancer screening is recommended for adults 55-80 years old who are at high risk for lung cancer because of a history of smoking.  A yearly low-dose CT scan of the lungs is recommended for people who:  Currently smoke.  Have quit within the past 15 years.  Have at least a 30-pack-year history of smoking. A pack year is smoking an average of one pack of cigarettes a day for 1 year.  Yearly screening should continue until it has been 15 years since you quit.  Yearly screening should stop if you develop a health problem that would prevent you from having lung cancer treatment.  Breast Cancer  Practice breast self-awareness. This means understanding how your breasts normally appear and feel.  It also means doing regular breast self-exams. Let your health care provider know about any changes, no matter how small.  If you are in your 20s or 30s, you should have a clinical breast exam (CBE) by a health care   provider every 1-3 years as part of a regular health exam.  If you are 40 or older, have a CBE every year. Also consider having a breast X-ray (mammogram) every year.  If you have a family history of breast cancer, talk to your health care provider about genetic screening.  If you are at high risk for breast cancer, talk to your health care provider about having an MRI and a mammogram every year.  Breast cancer gene (BRCA) assessment is recommended for women who have family members with BRCA-related cancers. BRCA-related cancers  include:  Breast.  Ovarian.  Tubal.  Peritoneal cancers.  Results of the assessment will determine the need for genetic counseling and BRCA1 and BRCA2 testing. Cervical Cancer Routine pelvic examinations to screen for cervical cancer are no longer recommended for nonpregnant women who are considered low risk for cancer of the pelvic organs (ovaries, uterus, and vagina) and who do not have symptoms. A pelvic examination may be necessary if you have symptoms including those associated with pelvic infections. Ask your health care provider if a screening pelvic exam is right for you.   The Pap test is the screening test for cervical cancer for women who are considered at risk.  If you had a hysterectomy for a problem that was not cancer or a condition that could lead to cancer, then you no longer need Pap tests.  If you are older than 65 years, and you have had normal Pap tests for the past 10 years, you no longer need to have Pap tests.  If you have had past treatment for cervical cancer or a condition that could lead to cancer, you need Pap tests and screening for cancer for at least 20 years after your treatment.  If you no longer get a Pap test, assess your risk factors if they change (such as having a new sexual partner). This can affect whether you should start being screened again.  Some women have medical problems that increase their chance of getting cervical cancer. If this is the case for you, your health care provider may recommend more frequent screening and Pap tests.  The human papillomavirus (HPV) test is another test that may be used for cervical cancer screening. The HPV test looks for the virus that can cause cell changes in the cervix. The cells collected during the Pap test can be tested for HPV.  The HPV test can be used to screen women 30 years of age and older. Getting tested for HPV can extend the interval between normal Pap tests from three to five years.  An HPV  test also should be used to screen women of any age who have unclear Pap test results.  After 78 years of age, women should have HPV testing as often as Pap tests.  Colorectal Cancer  This type of cancer can be detected and often prevented.  Routine colorectal cancer screening usually begins at 78 years of age and continues through 78 years of age.  Your health care provider may recommend screening at an earlier age if you have risk factors for colon cancer.  Your health care provider may also recommend using home test kits to check for hidden blood in the stool.  A small camera at the end of a tube can be used to examine your colon directly (sigmoidoscopy or colonoscopy). This is done to check for the earliest forms of colorectal cancer.  Routine screening usually begins at age 50.  Direct examination of the colon   should be repeated every 5-10 years through 78 years of age. However, you may need to be screened more often if early forms of precancerous polyps or small growths are found. Skin Cancer  Check your skin from head to toe regularly.  Tell your health care provider about any new moles or changes in moles, especially if there is a change in a mole's shape or color.  Also tell your health care provider if you have a mole that is larger than the size of a pencil eraser.  Always use sunscreen. Apply sunscreen liberally and repeatedly throughout the day.  Protect yourself by wearing long sleeves, pants, a wide-brimmed hat, and sunglasses whenever you are outside. HEART DISEASE, DIABETES, AND HIGH BLOOD PRESSURE   Have your blood pressure checked at least every 1-2 years. High blood pressure causes heart disease and increases the risk of stroke.  If you are between 55 years and 79 years old, ask your health care provider if you should take aspirin to prevent strokes.  Have regular diabetes screenings. This involves taking a blood sample to check your fasting blood sugar  level.  If you are at a normal weight and have a low risk for diabetes, have this test once every three years after 78 years of age.  If you are overweight and have a high risk for diabetes, consider being tested at a younger age or more often. PREVENTING INFECTION  Hepatitis B  If you have a higher risk for hepatitis B, you should be screened for this virus. You are considered at high risk for hepatitis B if:  You were born in a country where hepatitis B is common. Ask your health care provider which countries are considered high risk.  Your parents were born in a high-risk country, and you have not been immunized against hepatitis B (hepatitis B vaccine).  You have HIV or AIDS.  You use needles to inject street drugs.  You live with someone who has hepatitis B.  You have had sex with someone who has hepatitis B.  You get hemodialysis treatment.  You take certain medicines for conditions, including cancer, organ transplantation, and autoimmune conditions. Hepatitis C  Blood testing is recommended for:  Everyone born from 1945 through 1965.  Anyone with known risk factors for hepatitis C. Sexually transmitted infections (STIs)  You should be screened for sexually transmitted infections (STIs) including gonorrhea and chlamydia if:  You are sexually active and are younger than 78 years of age.  You are older than 78 years of age and your health care provider tells you that you are at risk for this type of infection.  Your sexual activity has changed since you were last screened and you are at an increased risk for chlamydia or gonorrhea. Ask your health care provider if you are at risk.  If you do not have HIV, but are at risk, it may be recommended that you take a prescription medicine daily to prevent HIV infection. This is called pre-exposure prophylaxis (PrEP). You are considered at risk if:  You are sexually active and do not regularly use condoms or know the HIV status  of your partner(s).  You take drugs by injection.  You are sexually active with a partner who has HIV. Talk with your health care provider about whether you are at high risk of being infected with HIV. If you choose to begin PrEP, you should first be tested for HIV. You should then be tested every 3 months for   as long as you are taking PrEP.  PREGNANCY   If you are premenopausal and you may become pregnant, ask your health care provider about preconception counseling.  If you may become pregnant, take 400 to 800 micrograms (mcg) of folic acid every day.  If you want to prevent pregnancy, talk to your health care provider about birth control (contraception). OSTEOPOROSIS AND MENOPAUSE   Osteoporosis is a disease in which the bones lose minerals and strength with aging. This can result in serious bone fractures. Your risk for osteoporosis can be identified using a bone density scan.  If you are 65 years of age or older, or if you are at risk for osteoporosis and fractures, ask your health care provider if you should be screened.  Ask your health care provider whether you should take a calcium or vitamin D supplement to lower your risk for osteoporosis.  Menopause may have certain physical symptoms and risks.  Hormone replacement therapy may reduce some of these symptoms and risks. Talk to your health care provider about whether hormone replacement therapy is right for you.  HOME CARE INSTRUCTIONS   Schedule regular health, dental, and eye exams.  Stay current with your immunizations.   Do not use any tobacco products including cigarettes, chewing tobacco, or electronic cigarettes.  If you are pregnant, do not drink alcohol.  If you are breastfeeding, limit how much and how often you drink alcohol.  Limit alcohol intake to no more than 1 drink per day for nonpregnant women. One drink equals 12 ounces of beer, 5 ounces of wine, or 1 ounces of hard liquor.  Do not use street  drugs.  Do not share needles.  Ask your health care provider for help if you need support or information about quitting drugs.  Tell your health care provider if you often feel depressed.  Tell your health care provider if you have ever been abused or do not feel safe at home. Document Released: 06/20/2011 Document Revised: 04/21/2014 Document Reviewed: 11/06/2013 [employer] Patient Information 2015 [employer]. This information is not intended to replace advice given to you by your health care provider. Make sure you discuss any questions you have with your health care provider.  

## 2015-01-06 ENCOUNTER — Telehealth: Payer: Self-pay | Admitting: Internal Medicine

## 2015-01-06 MED ORDER — LEVOTHYROXINE SODIUM 100 MCG PO TABS
100.0000 ug | ORAL_TABLET | Freq: Every day | ORAL | Status: DC
Start: 2015-01-06 — End: 2015-09-25

## 2015-01-06 NOTE — Telephone Encounter (Signed)
Pt notified new Rx for Levothyroxine 100 mcg was sent to [facility].

## 2015-01-06 NOTE — Telephone Encounter (Signed)
Please call in a new prescription for levothyroxine 100 g #100   1 tablet daily refill times 3

## 2015-01-06 NOTE — Telephone Encounter (Signed)
Please advise 

## 2015-01-06 NOTE — Telephone Encounter (Signed)
Pt had her physical on 01/01/15 and was told by Dr Raliegh Ip that he was going to change the following med  levothyroxine (SYNTHROID, LEVOTHROID) 75 MCG tablet  Pt is asking what the new med is and would like it sent to [facility]

## 2015-01-12 ENCOUNTER — Telehealth: Payer: Self-pay | Admitting: Internal Medicine

## 2015-01-12 MED ORDER — TRAMADOL HCL 50 MG PO TABS: 50.0000 mg | ORAL_TABLET | Freq: Four times a day (QID) | ORAL | Status: DC | PRN

## 2015-01-12 NOTE — Telephone Encounter (Signed)
Rx faxed to [facility].

## 2015-01-12 NOTE — Telephone Encounter (Signed)
Pt requesting refill of traMADol (ULTRAM) 50 MG tablet sent to [facility].

## 2015-01-13 NOTE — Telephone Encounter (Signed)
Pt notified Rx sent to [facility].

## 2015-02-25 ENCOUNTER — Telehealth: Payer: Self-pay | Admitting: Internal Medicine

## 2015-02-25 ENCOUNTER — Other Ambulatory Visit: Payer: Self-pay | Admitting: *Deleted

## 2015-02-25 MED ORDER — SOLIFENACIN SUCCINATE 10 MG PO TABS: 10.0000 mg | ORAL_TABLET | Freq: Every day | ORAL | Status: DC

## 2015-02-25 NOTE — Telephone Encounter (Signed)
PA was denied for Benicar HCT.  Patient must try and fail 2 of the formulary alternatives :Losartan, Valsartan, HCTZ.  Patient would like a callback on her [phone]) [phone] or (m) [phone].

## 2015-02-26 MED ORDER — LOSARTAN POTASSIUM-HCTZ 100-25 MG PO TABS
1.0000 | ORAL_TABLET | Freq: Every day | ORAL | Status: DC
Start: 2015-02-26 — End: 2015-04-12

## 2015-02-26 NOTE — Telephone Encounter (Signed)
Left message on voicemail to call office.  

## 2015-02-26 NOTE — Telephone Encounter (Signed)
Please see message and advise 

## 2015-02-26 NOTE — Telephone Encounter (Signed)
Okay to substitute generic Hyzaar 100/25, #90 one daily

## 2015-02-27 NOTE — Telephone Encounter (Signed)
Left message on voicemail to call office.  

## 2015-03-02 ENCOUNTER — Other Ambulatory Visit: Payer: Self-pay | Admitting: Nephrology

## 2015-03-02 DIAGNOSIS — N184 Chronic kidney disease, stage 4 (severe): Secondary | ICD-10-CM

## 2015-03-03 NOTE — Telephone Encounter (Signed)
Spoke to pt told her new Rx Hyzaar 100/25 mg  for blood pressure sent to [facility] due to insurance change. Pt verbalized understanding and said [facility] already called her and is sending the medication.

## 2015-03-11 ENCOUNTER — Ambulatory Visit
Admission: RE | Admit: 2015-03-11 | Discharge: 2015-03-11 | Disposition: A | Discharge status: Home / Self Care | Payer: Medicare Other | Source: Ambulatory Visit | Attending: Nephrology | Admitting: Nephrology

## 2015-03-11 DIAGNOSIS — N184 Chronic kidney disease, stage 4 (severe): Secondary | ICD-10-CM

## 2015-03-24 ENCOUNTER — Encounter: Payer: Self-pay | Admitting: Internal Medicine

## 2015-03-24 ENCOUNTER — Ambulatory Visit (INDEPENDENT_AMBULATORY_CARE_PROVIDER_SITE_OTHER): Payer: Medicare Other | Admitting: Internal Medicine

## 2015-03-24 VITALS — BP 126/72 | HR 94 | Temp 98.1°F | Resp 20 | Ht 64.5 in | Wt 241.0 lb

## 2015-03-24 DIAGNOSIS — J4521 Mild intermittent asthma with (acute) exacerbation: Secondary | ICD-10-CM | POA: Diagnosis not present

## 2015-03-24 DIAGNOSIS — I10 Essential (primary) hypertension: Secondary | ICD-10-CM

## 2015-03-24 MED ORDER — PREDNISONE 10 MG PO TABS: ORAL_TABLET | ORAL | Status: DC

## 2015-03-24 NOTE — Progress Notes (Signed)
Subjective:    Patient ID: [name], female    DOB: 1935/11/03, 80 y.o.   MRN: [id number]  HPI 81 year old patient who has treated hypertension and a history of asthma.  For the past couple weeks she has had hoarseness and cough, which has intensified over the past week she has had worsening cough, congestion and some wheezing.  No fever or sputum production.  Past Medical History  Diagnosis Date  . ASTHMA 06/18/2007  . COLONIC POLYPS, HX OF 11/06/2009  . DYSPNEA ON EXERTION 05/06/2008  . GERD 06/18/2007  . HYPERCHOLESTEROLEMIA, PURE 07/09/2007  . HYPERTENSION 06/18/2007  . HYPOTHYROIDISM 06/18/2007  . NEPHROLITHIASIS, HX OF 08/07/2008  . OSTEOARTHRITIS 05/07/2008  . RENAL INSUFFICIENCY 05/06/2008  . Goiter   . Obesity   . Insomnia   . Renal cyst     History   Social History  . Marital Status: Married    Spouse Name: N/A  . Number of Children: N/A  . Years of Education: N/A   Occupational History  . Not on file.   Social History Main Topics  . Smoking status: Former Smoker    Quit date: 12/19/1978  . Smokeless tobacco: Never Used  . Alcohol Use: Yes     Comment: rarely  . Drug Use: No  . Sexual Activity: Not on file   Other Topics Concern  . Not on file   Social History Narrative    Past Surgical History  Procedure Laterality Date  . Abdominal hysterectomy    . Tonsillectomy    . Cataract extraction    . Knee arthroscopy    . Carpal tunnel release    . Total knee arthroplasty    . Lithotripsy      Family History  Problem Relation Age of Onset  . Hypertension Neg Hx     family  . Cancer Neg Hx     colon ca , prostate ca    No Known Allergies  Current Outpatient Prescriptions on File Prior to Visit  Medication Sig Dispense Refill  . albuterol (PROVENTIL HFA;VENTOLIN HFA) 108 (90 BASE) MCG/ACT inhaler Inhale 2 puffs into the lungs every 6 (six) hours as needed for wheezing. 1 Inhaler 0  . beclomethasone (QVAR) 80 MCG/ACT inhaler Inhale 1 puff into  the lungs as needed. 1 Inhaler 6  . DULoxetine (CYMBALTA) 30 MG capsule TAKE 1 BY MOUTH DAILY 90 capsule 1  . esomeprazole (NEXIUM) 40 MG capsule Take 1 capsule (40 mg total) by mouth daily before breakfast. 90 capsule 3  . levothyroxine (SYNTHROID, LEVOTHROID) 100 MCG tablet Take 1 tablet (100 mcg total) by mouth daily. 90 tablet 3  . losartan-hydrochlorothiazide (HYZAAR) 100-25 MG per tablet Take 1 tablet by mouth daily. 90 tablet 1  . montelukast (SINGULAIR) 10 MG tablet Take 1 tablet (10 mg total) by mouth daily. 90 tablet 3  . simvastatin (ZOCOR) 20 MG tablet Take 1 tablet (20 mg total) by mouth at bedtime. 90 tablet 3  . solifenacin (VESICARE) 10 MG tablet Take 1 tablet (10 mg total) by mouth daily. 90 tablet 0  . traMADol (ULTRAM) 50 MG tablet Take 1 tablet (50 mg total) by mouth every 6 (six) hours as needed. 90 tablet 1  . [DISCONTINUED] solifenacin (VESICARE) 10 MG tablet Take 1 tablet (10 mg total) by mouth daily. 90 tablet 3   No current facility-administered medications on file prior to visit.    BP 126/72 mmHg  Pulse 94  Temp(Src) 98.1 F (36.7 C) (Oral)  Resp 20  Ht 5' 4.5" (1.638 m)  Wt 241 lb (109.317 kg)  BMI 40.74 kg/m2  SpO2 96%       Review of Systems  Constitutional: Positive for activity change, appetite change and fatigue.  HENT: Positive for sore throat and voice change. Negative for congestion, dental problem, hearing loss, rhinorrhea, sinus pressure and tinnitus.   Eyes: Negative for pain, discharge and visual disturbance.  Respiratory: Positive for cough and wheezing. Negative for shortness of breath.   Cardiovascular: Negative for chest pain, palpitations and leg swelling.  Gastrointestinal: Negative for nausea, vomiting, abdominal pain, diarrhea, constipation, blood in stool and abdominal distention.  Genitourinary: Negative for dysuria, urgency, frequency, hematuria, flank pain, vaginal bleeding, vaginal discharge, difficulty urinating, vaginal pain  and pelvic pain.  Musculoskeletal: Negative for joint swelling, arthralgias and gait problem.  Skin: Negative for rash.  Neurological: Negative for dizziness, syncope, speech difficulty, weakness, numbness and headaches.  Hematological: Negative for adenopathy.  Psychiatric/Behavioral: Negative for behavioral problems, dysphoric mood and agitation. The patient is not nervous/anxious.        Objective:   Physical Exam  Constitutional: She is oriented to person, place, and time. She appears well-developed and well-nourished.  Afebrile O2 saturation 96 Frequent paroxysms of coughing  HENT:  Head: Normocephalic.  Right Ear: External ear normal.  Left Ear: External ear normal.  Mouth/Throat: Oropharynx is clear and moist.  Eyes: Conjunctivae and EOM are normal. Pupils are equal, round, and reactive to light.  Neck: Normal range of motion. Neck supple. No thyromegaly present.  Cardiovascular: Normal rate, regular rhythm, normal heart sounds and intact distal pulses.   Pulmonary/Chest: Effort normal. No respiratory distress. She has wheezes.  Scattered coarse rhonchi and some faint expiratory wheezing heard anteriorly  Abdominal: Soft. Bowel sounds are normal. She exhibits no mass. There is no tenderness.  Musculoskeletal: Normal range of motion.  Lymphadenopathy:    She has no cervical adenopathy.  Neurological: She is alert and oriented to person, place, and time.  Skin: Skin is warm and dry. No rash noted.  Psychiatric: She has a normal mood and affect. Her behavior is normal.          Assessment & Plan:   Asthma exacerbation.  We'll treat with a prednisone Dosepak expectorants antitussives Hypertension, stable  We'll resume on inhalational medications

## 2015-03-24 NOTE — Progress Notes (Signed)
Pre visit review using our clinic review tool, if applicable. No additional management support is needed unless otherwise documented below in the visit note. 

## 2015-03-24 NOTE — Patient Instructions (Signed)
Prednisone therapy as discussed Resume inhalational medications   Take over-the-counter expectorants and cough medications such as  Mucinex DM.  Call if there is no improvement in 5 to 7 days or if  you develop worsening cough, fever, or new symptoms, such as shortness of breath or chest pain.  Asthma, Acute Bronchospasm Acute bronchospasm caused by asthma is also referred to as an asthma attack. Bronchospasm means your air passages become narrowed. The narrowing is caused by inflammation and tightening of the muscles in the air tubes (bronchi) in your lungs. This can make it hard to breathe or cause you to wheeze and cough. CAUSES Possible triggers are:  Animal dander from the skin, hair, or feathers of animals.  Dust mites contained in house dust.  Cockroaches.  Pollen from trees or grass.  Mold.  Cigarette or tobacco smoke.  Air pollutants such as dust, household cleaners, hair sprays, aerosol sprays, paint fumes, strong chemicals, or strong odors.  Cold air or weather changes. Cold air may trigger inflammation. Winds increase molds and pollens in the air.  Strong emotions such as crying or laughing hard.  Stress.  Certain medicines such as aspirin or beta-blockers.  Sulfites in foods and drinks, such as dried fruits and wine.  Infections or inflammatory conditions, such as a flu, cold, or inflammation of the nasal membranes (rhinitis).  Gastroesophageal reflux disease (GERD). GERD is a condition where stomach acid backs up into your esophagus.  Exercise or strenuous activity. SIGNS AND SYMPTOMS   Wheezing.  Excessive coughing, particularly at night.  Chest tightness.  Shortness of breath. DIAGNOSIS  Your health care provider will ask you about your medical history and perform a physical exam. A chest X-ray or blood testing may be performed to look for other causes of your symptoms or other conditions that may have triggered your asthma attack. TREATMENT    Treatment is aimed at reducing inflammation and opening up the airways in your lungs. Most asthma attacks are treated with inhaled medicines. These include quick relief or rescue medicines (such as bronchodilators) and controller medicines (such as inhaled corticosteroids). These medicines are sometimes given through an inhaler or a nebulizer. Systemic steroid medicine taken by mouth or given through an IV tube also can be used to reduce the inflammation when an attack is moderate or severe. Antibiotic medicines are only used if a bacterial infection is present.  HOME CARE INSTRUCTIONS   Rest.  Drink plenty of liquids. This helps the mucus to remain thin and be easily coughed up. Only use caffeine in moderation and do not use alcohol until you have recovered from your illness.  Do not smoke. Avoid being exposed to secondhand smoke.  You play a critical role in keeping yourself in good health. Avoid exposure to things that cause you to wheeze or to have breathing problems.  Keep your medicines up-to-date and available. Carefully follow your health care provider's treatment plan.  Take your medicine exactly as prescribed.  When pollen or pollution is bad, keep windows closed and use an air conditioner or go to places with air conditioning.  Asthma requires careful medical care. See your health care provider for a follow-up as advised. If you are more than [redacted] weeks pregnant and you were prescribed any new medicines, let your obstetrician know about the visit and how you are doing. Follow up with your health care provider as directed.  After you have recovered from your asthma attack, make an appointment with your outpatient doctor to talk  about ways to reduce the likelihood of future attacks. If you do not have a doctor who manages your asthma, make an appointment with a primary care doctor to discuss your asthma. SEEK IMMEDIATE MEDICAL CARE IF:   You are getting worse.  You have trouble  breathing. If severe, call your local emergency services (911 in the [place]  You develop chest pain or discomfort.  You are vomiting.  You are not able to keep fluids down.  You are coughing up yellow, green, brown, or bloody sputum.  You have a fever and your symptoms suddenly get worse.  You have trouble swallowing. MAKE SURE YOU:   Understand these instructions.  Will watch your condition.  Will get help right away if you are not doing well or get worse. Document Released: 03/22/2007 Document Revised: 12/10/2013 Document Reviewed: 06/12/2013 [facility] Patient Information 2015 [place], [place]. This information is not intended to replace advice given to you by your health care provider. Make sure you discuss any questions you have with your health care provider.

## 2015-03-25 ENCOUNTER — Telehealth: Payer: Self-pay | Admitting: Internal Medicine

## 2015-03-25 NOTE — Telephone Encounter (Signed)
emmi emailed °

## 2015-03-30 ENCOUNTER — Other Ambulatory Visit: Payer: Self-pay | Admitting: Internal Medicine

## 2015-04-02 ENCOUNTER — Other Ambulatory Visit: Payer: Self-pay | Admitting: *Deleted

## 2015-04-02 MED ORDER — BECLOMETHASONE DIPROPIONATE 80 MCG/ACT IN AERS: 1.0000 | INHALATION_SPRAY | RESPIRATORY_TRACT | Status: DC | PRN

## 2015-04-02 NOTE — Telephone Encounter (Signed)
Pt called needing refill on Qvar sent to [facility]. Told pt will send Rx. Pt verbalized understanding. Rx sent

## 2015-04-06 ENCOUNTER — Telehealth: Payer: Self-pay | Admitting: Internal Medicine

## 2015-04-06 MED ORDER — BECLOMETHASONE DIPROPIONATE 80 MCG/ACT IN AERS: 2.0000 | INHALATION_SPRAY | Freq: Two times a day (BID) | RESPIRATORY_TRACT | Status: DC

## 2015-04-06 NOTE — Telephone Encounter (Signed)
Jory from [facility] called to say that she need direction on a rx    Ref no. [id number]   [phone]

## 2015-04-06 NOTE — Telephone Encounter (Signed)
Called and spoke with pt and pt states she takes Qvar- Take 2 puffs twice daily.

## 2015-04-06 NOTE — Addendum Note (Signed)
Addended by: Colleen Can on: 04/06/2015 04:43 PM   Modules accepted: Orders

## 2015-04-06 NOTE — Telephone Encounter (Signed)
Rx resent to [facility].

## 2015-04-08 NOTE — Telephone Encounter (Signed)
Rx re-sent with correct directions.  

## 2015-04-09 ENCOUNTER — Ambulatory Visit: Payer: Medicare Other | Admitting: Family Medicine

## 2015-04-09 ENCOUNTER — Emergency Department (HOSPITAL_COMMUNITY): Payer: Medicare Other

## 2015-04-09 ENCOUNTER — Encounter (HOSPITAL_COMMUNITY): Payer: Self-pay | Admitting: Emergency Medicine

## 2015-04-09 ENCOUNTER — Inpatient Hospital Stay (HOSPITAL_COMMUNITY)
Admission: EM | Admit: 2015-04-09 | Discharge: 2015-04-12 | DRG: 202 | Disposition: A | Discharge status: Home / Self Care | Payer: Medicare Other | Attending: Internal Medicine | Admitting: Internal Medicine

## 2015-04-09 DIAGNOSIS — E7800 Pure hypercholesterolemia, unspecified: Secondary | ICD-10-CM | POA: Diagnosis present

## 2015-04-09 DIAGNOSIS — E669 Obesity, unspecified: Secondary | ICD-10-CM | POA: Diagnosis present

## 2015-04-09 DIAGNOSIS — J9601 Acute respiratory failure with hypoxia: Secondary | ICD-10-CM | POA: Diagnosis not present

## 2015-04-09 DIAGNOSIS — T502X5A Adverse effect of carbonic-anhydrase inhibitors, benzothiadiazides and other diuretics, initial encounter: Secondary | ICD-10-CM | POA: Diagnosis present

## 2015-04-09 DIAGNOSIS — Z87891 Personal history of nicotine dependence: Secondary | ICD-10-CM

## 2015-04-09 DIAGNOSIS — Z800 Family history of malignant neoplasm of digestive organs: Secondary | ICD-10-CM

## 2015-04-09 DIAGNOSIS — R062 Wheezing: Secondary | ICD-10-CM

## 2015-04-09 DIAGNOSIS — J45901 Unspecified asthma with (acute) exacerbation: Principal | ICD-10-CM | POA: Diagnosis present

## 2015-04-09 DIAGNOSIS — J208 Acute bronchitis due to other specified organisms: Secondary | ICD-10-CM | POA: Diagnosis present

## 2015-04-09 DIAGNOSIS — R0600 Dyspnea, unspecified: Secondary | ICD-10-CM

## 2015-04-09 DIAGNOSIS — N184 Chronic kidney disease, stage 4 (severe): Secondary | ICD-10-CM | POA: Diagnosis present

## 2015-04-09 DIAGNOSIS — R053 Chronic cough: Secondary | ICD-10-CM | POA: Diagnosis present

## 2015-04-09 DIAGNOSIS — R7989 Other specified abnormal findings of blood chemistry: Secondary | ICD-10-CM

## 2015-04-09 DIAGNOSIS — I129 Hypertensive chronic kidney disease with stage 1 through stage 4 chronic kidney disease, or unspecified chronic kidney disease: Secondary | ICD-10-CM | POA: Diagnosis present

## 2015-04-09 DIAGNOSIS — N179 Acute kidney failure, unspecified: Secondary | ICD-10-CM | POA: Diagnosis present

## 2015-04-09 DIAGNOSIS — E785 Hyperlipidemia, unspecified: Secondary | ICD-10-CM | POA: Diagnosis present

## 2015-04-09 DIAGNOSIS — R05 Cough: Secondary | ICD-10-CM

## 2015-04-09 DIAGNOSIS — R000 Tachycardia, unspecified: Secondary | ICD-10-CM | POA: Diagnosis present

## 2015-04-09 DIAGNOSIS — Z6841 Body Mass Index (BMI) 40.0 and over, adult: Secondary | ICD-10-CM

## 2015-04-09 DIAGNOSIS — Z86010 Personal history of colonic polyps: Secondary | ICD-10-CM

## 2015-04-09 DIAGNOSIS — Z79899 Other long term (current) drug therapy: Secondary | ICD-10-CM

## 2015-04-09 DIAGNOSIS — I4581 Long QT syndrome: Secondary | ICD-10-CM | POA: Diagnosis present

## 2015-04-09 DIAGNOSIS — Z87442 Personal history of urinary calculi: Secondary | ICD-10-CM

## 2015-04-09 DIAGNOSIS — E039 Hypothyroidism, unspecified: Secondary | ICD-10-CM | POA: Diagnosis present

## 2015-04-09 DIAGNOSIS — K219 Gastro-esophageal reflux disease without esophagitis: Secondary | ICD-10-CM | POA: Diagnosis present

## 2015-04-09 DIAGNOSIS — I10 Essential (primary) hypertension: Secondary | ICD-10-CM | POA: Diagnosis present

## 2015-04-09 LAB — I-STAT TROPONIN, ED: Troponin i, poc: 0.02 ng/mL (ref 0.00–0.08)

## 2015-04-09 LAB — D-DIMER, QUANTITATIVE (NOT AT ARMC): D DIMER QUANT: 3.75 ug{FEU}/mL — AB (ref 0.00–0.48)

## 2015-04-09 LAB — BASIC METABOLIC PANEL
Anion gap: 12 (ref 5–15)
BUN: 43 mg/dL — AB (ref 6–23)
CALCIUM: 9.5 mg/dL (ref 8.4–10.5)
CO2: 22 mmol/L (ref 19–32)
Chloride: 100 mmol/L (ref 96–112)
Creatinine, Ser: 2.31 mg/dL — ABNORMAL HIGH (ref 0.50–1.10)
GFR calc Af Amer: 22 mL/min — ABNORMAL LOW (ref >90)
GFR calc non Af Amer: 19 mL/min — ABNORMAL LOW (ref >90)
GLUCOSE: 129 mg/dL — AB (ref 70–99)
Potassium: 4.1 mmol/L (ref 3.5–5.1)
SODIUM: 134 mmol/L — AB (ref 135–145)

## 2015-04-09 LAB — I-STAT CG4 LACTIC ACID, ED: LACTIC ACID, VENOUS: 1.03 mmol/L (ref 0.5–2.0)

## 2015-04-09 LAB — CBC
HCT: 43.4 % (ref 36.0–46.0)
Hemoglobin: 14.2 g/dL (ref 12.0–15.0)
MCH: 28.6 pg (ref 26.0–34.0)
MCHC: 32.7 g/dL (ref 30.0–36.0)
MCV: 87.5 fL (ref 78.0–100.0)
Platelets: 213 10*3/uL (ref 150–400)
RBC: 4.96 MIL/uL (ref 3.87–5.11)
RDW: 15 % (ref 11.5–15.5)
WBC: 14.3 10*3/uL — ABNORMAL HIGH (ref 4.0–10.5)

## 2015-04-09 LAB — BRAIN NATRIURETIC PEPTIDE: B Natriuretic Peptide: 108 pg/mL — ABNORMAL HIGH (ref 0.0–100.0)

## 2015-04-09 LAB — MAGNESIUM: Magnesium: 1.5 mg/dL (ref 1.5–2.5)

## 2015-04-09 MED ORDER — DARIFENACIN HYDROBROMIDE ER 15 MG PO TB24
15.0000 mg | ORAL_TABLET | Freq: Every day | ORAL | Status: DC
  Administered 2015-04-09 – 2015-04-12 (×4): 15 mg via ORAL
  Filled 2015-04-09 (×4): qty 1

## 2015-04-09 MED ORDER — METHYLPREDNISOLONE SODIUM SUCC 125 MG IJ SOLR
60.0000 mg | Freq: Four times a day (QID) | INTRAMUSCULAR | Status: DC
  Administered 2015-04-10 (×2): 60 mg via INTRAVENOUS
  Filled 2015-04-09 (×2): qty 2

## 2015-04-09 MED ORDER — LOSARTAN POTASSIUM 50 MG PO TABS
100.0000 mg | ORAL_TABLET | Freq: Every day | ORAL | Status: DC
Start: 2015-04-10 — End: 2015-04-10
  Administered 2015-04-10: 100 mg via ORAL
  Filled 2015-04-09: qty 2

## 2015-04-09 MED ORDER — GUAIFENESIN ER 600 MG PO TB12
1200.0000 mg | ORAL_TABLET | Freq: Two times a day (BID) | ORAL | Status: DC
  Administered 2015-04-09 – 2015-04-12 (×6): 1200 mg via ORAL
  Filled 2015-04-09 (×6): qty 2

## 2015-04-09 MED ORDER — DULOXETINE HCL 30 MG PO CPEP
30.0000 mg | ORAL_CAPSULE | Freq: Every day | ORAL | Status: DC
  Administered 2015-04-10 – 2015-04-12 (×3): 30 mg via ORAL
  Filled 2015-04-09 (×3): qty 1

## 2015-04-09 MED ORDER — GUAIFENESIN-DM 100-10 MG/5ML PO SYRP
10.0000 mL | ORAL_SOLUTION | ORAL | Status: DC | PRN
  Administered 2015-04-12: 10 mL via ORAL
  Filled 2015-04-09: qty 10

## 2015-04-09 MED ORDER — SODIUM CHLORIDE 0.9 % IJ SOLN
3.0000 mL | Freq: Two times a day (BID) | INTRAMUSCULAR | Status: DC
  Administered 2015-04-10: 3 mL via INTRAVENOUS

## 2015-04-09 MED ORDER — ACETAMINOPHEN 650 MG RE SUPP: 650.0000 mg | Freq: Four times a day (QID) | RECTAL | Status: DC | PRN

## 2015-04-09 MED ORDER — SODIUM CHLORIDE 0.9 % IV SOLN
INTRAVENOUS | Status: AC
  Administered 2015-04-09: 18:00:00 via INTRAVENOUS

## 2015-04-09 MED ORDER — SIMVASTATIN 20 MG PO TABS
20.0000 mg | ORAL_TABLET | Freq: Every day | ORAL | Status: DC
  Administered 2015-04-10 – 2015-04-12 (×3): 20 mg via ORAL
  Filled 2015-04-09 (×3): qty 1

## 2015-04-09 MED ORDER — IPRATROPIUM-ALBUTEROL 0.5-2.5 (3) MG/3ML IN SOLN
3.0000 mL | Freq: Three times a day (TID) | RESPIRATORY_TRACT | Status: DC
  Administered 2015-04-10 – 2015-04-12 (×7): 3 mL via RESPIRATORY_TRACT
  Filled 2015-04-09 (×7): qty 3

## 2015-04-09 MED ORDER — METHYLPREDNISOLONE SODIUM SUCC 125 MG IJ SOLR
125.0000 mg | Freq: Once | INTRAMUSCULAR | Status: AC
  Administered 2015-04-09: 125 mg via INTRAVENOUS
  Filled 2015-04-09: qty 2

## 2015-04-09 MED ORDER — TRAMADOL HCL 50 MG PO TABS: 50.0000 mg | ORAL_TABLET | Freq: Four times a day (QID) | ORAL | Status: DC | PRN

## 2015-04-09 MED ORDER — ALBUTEROL SULFATE (2.5 MG/3ML) 0.083% IN NEBU: 2.5000 mg | INHALATION_SOLUTION | RESPIRATORY_TRACT | Status: DC | PRN

## 2015-04-09 MED ORDER — TECHNETIUM TO 99M ALBUMIN AGGREGATED
5.8000 | Freq: Once | INTRAVENOUS | Status: AC | PRN
  Administered 2015-04-09: 6 via INTRAVENOUS

## 2015-04-09 MED ORDER — TECHNETIUM TC 99M DIETHYLENETRIAME-PENTAACETIC ACID: 44.6000 | Freq: Once | INTRAVENOUS | Status: DC | PRN

## 2015-04-09 MED ORDER — BENZONATATE 100 MG PO CAPS
200.0000 mg | ORAL_CAPSULE | Freq: Three times a day (TID) | ORAL | Status: DC
  Administered 2015-04-09 – 2015-04-12 (×9): 200 mg via ORAL
  Filled 2015-04-09 (×9): qty 2

## 2015-04-09 MED ORDER — LEVOTHYROXINE SODIUM 100 MCG PO TABS
100.0000 ug | ORAL_TABLET | Freq: Every day | ORAL | Status: DC
  Administered 2015-04-09 – 2015-04-12 (×4): 100 ug via ORAL
  Filled 2015-04-09 (×4): qty 1

## 2015-04-09 MED ORDER — ALUM & MAG HYDROXIDE-SIMETH 200-200-20 MG/5ML PO SUSP: 30.0000 mL | Freq: Four times a day (QID) | ORAL | Status: DC | PRN

## 2015-04-09 MED ORDER — IPRATROPIUM-ALBUTEROL 0.5-2.5 (3) MG/3ML IN SOLN
3.0000 mL | Freq: Four times a day (QID) | RESPIRATORY_TRACT | Status: DC
  Administered 2015-04-09: 3 mL via RESPIRATORY_TRACT
  Filled 2015-04-09: qty 3

## 2015-04-09 MED ORDER — LORATADINE 10 MG PO TABS
10.0000 mg | ORAL_TABLET | Freq: Every day | ORAL | Status: DC
  Administered 2015-04-09 – 2015-04-12 (×4): 10 mg via ORAL
  Filled 2015-04-09 (×4): qty 1

## 2015-04-09 MED ORDER — IPRATROPIUM-ALBUTEROL 0.5-2.5 (3) MG/3ML IN SOLN
3.0000 mL | Freq: Once | RESPIRATORY_TRACT | Status: AC
  Administered 2015-04-09: 3 mL via RESPIRATORY_TRACT
  Filled 2015-04-09: qty 3

## 2015-04-09 MED ORDER — PANTOPRAZOLE SODIUM 40 MG PO TBEC
40.0000 mg | DELAYED_RELEASE_TABLET | Freq: Two times a day (BID) | ORAL | Status: DC
  Administered 2015-04-09 – 2015-04-12 (×6): 40 mg via ORAL
  Filled 2015-04-09 (×6): qty 1

## 2015-04-09 MED ORDER — ACETAMINOPHEN 325 MG PO TABS: 650.0000 mg | ORAL_TABLET | Freq: Four times a day (QID) | ORAL | Status: DC | PRN

## 2015-04-09 MED ORDER — LOSARTAN POTASSIUM-HCTZ 100-25 MG PO TABS: 1.0000 | ORAL_TABLET | Freq: Every day | ORAL | Status: DC

## 2015-04-09 MED ORDER — HYDROCHLOROTHIAZIDE 25 MG PO TABS
25.0000 mg | ORAL_TABLET | Freq: Every day | ORAL | Status: DC
  Administered 2015-04-10: 25 mg via ORAL
  Filled 2015-04-09: qty 1

## 2015-04-09 MED ORDER — MONTELUKAST SODIUM 10 MG PO TABS
10.0000 mg | ORAL_TABLET | Freq: Every day | ORAL | Status: DC
  Administered 2015-04-09 – 2015-04-12 (×4): 10 mg via ORAL
  Filled 2015-04-09 (×4): qty 1

## 2015-04-09 MED ORDER — ENOXAPARIN SODIUM 30 MG/0.3ML ~~LOC~~ SOLN
30.0000 mg | SUBCUTANEOUS | Status: DC
  Administered 2015-04-09 – 2015-04-11 (×3): 30 mg via SUBCUTANEOUS
  Filled 2015-04-09 (×3): qty 0.3

## 2015-04-09 NOTE — ED Notes (Signed)
X-ray at bedside

## 2015-04-09 NOTE — ED Notes (Signed)
Charge notified and pt can go at 17:05

## 2015-04-09 NOTE — ED Notes (Signed)
Pt c/o SOB and dizziness intermittently since yesterday.  Hypotensive in triage at 86/60.

## 2015-04-09 NOTE — ED Notes (Signed)
Patient transported to X-ray 

## 2015-04-09 NOTE — ED Provider Notes (Signed)
CSN: [id number]     Arrival date & time 04/09/15  1115 History   First MD Initiated Contact with Patient 04/09/15 1146     Chief Complaint  Patient presents with  . Shortness of Breath  . Dizziness  . Hypotension     (Consider location/radiation/quality/duration/timing/severity/associated sxs/prior Treatment) HPI Comments: Pt is a 80 y.o. female with Pmhx as above who presents with about 1 week of cough, not improved by course of prednisone, now with increased shortness of breath, dizziness described as lightheadedness and wheezing which has worsened since yesterday.  She denies chest pain, she had one day of leg pain last week, but nonetheless several days.  She also denies fevers or chills.   Patient is a 81 y.o. female presenting with shortness of breath and dizziness.  Shortness of Breath Associated symptoms: cough and wheezing   Associated symptoms: no abdominal pain, no chest pain, no diaphoresis, no fever, no headaches, no neck pain, no sore throat and no vomiting   Dizziness Associated symptoms: shortness of breath   Associated symptoms: no chest pain, no diarrhea, no headaches, no nausea, no palpitations, no vomiting and no weakness     Past Medical History  Diagnosis Date  . ASTHMA 06/18/2007  . COLONIC POLYPS, HX OF 11/06/2009  . DYSPNEA ON EXERTION 05/06/2008  . GERD 06/18/2007  . HYPERCHOLESTEROLEMIA, PURE 07/09/2007  . HYPERTENSION 06/18/2007  . HYPOTHYROIDISM 06/18/2007  . NEPHROLITHIASIS, HX OF 08/07/2008  . OSTEOARTHRITIS 05/07/2008  . RENAL INSUFFICIENCY 05/06/2008  . Goiter   . Obesity   . Insomnia   . Renal cyst    Past Surgical History  Procedure Laterality Date  . Abdominal hysterectomy    . Tonsillectomy    . Cataract extraction    . Knee arthroscopy    . Carpal tunnel release    . Total knee arthroplasty    . Lithotripsy     Family History  Problem Relation Age of Onset  . Hypertension Neg Hx     family  . Cancer Neg Hx     colon ca , prostate ca    History  Substance Use Topics  . Smoking status: Former Smoker    Quit date: 12/19/1978  . Smokeless tobacco: Never Used  . Alcohol Use: Yes     Comment: rarely   OB History    No data available     Review of Systems  Constitutional: Negative for fever, chills, diaphoresis, activity change, appetite change and fatigue.  HENT: Negative for congestion, facial swelling, rhinorrhea and sore throat.   Eyes: Negative for photophobia and discharge.  Respiratory: Positive for cough, shortness of breath and wheezing. Negative for chest tightness.   Cardiovascular: Negative for chest pain, palpitations and leg swelling.  Gastrointestinal: Negative for nausea, vomiting, abdominal pain and diarrhea.  Endocrine: Negative for polydipsia and polyuria.  Genitourinary: Negative for dysuria, frequency, difficulty urinating and pelvic pain.  Musculoskeletal: Negative for back pain, arthralgias, neck pain and neck stiffness.  Skin: Negative for color change and wound.  Allergic/Immunologic: Negative for immunocompromised state.  Neurological: Positive for dizziness. Negative for facial asymmetry, weakness, numbness and headaches.  Hematological: Does not bruise/bleed easily.  Psychiatric/Behavioral: Negative for confusion and agitation.      Allergies  Review of patient's allergies indicates no known allergies.  Home Medications   Prior to Admission medications   Medication Sig Start Date End Date Taking? Authorizing Provider  beclomethasone (QVAR) 80 MCG/ACT inhaler Inhale 2 puffs into the lungs 2 (two) times  daily. 04/06/15  Yes Marletta Lor, MD  cetirizine (ZYRTEC) 10 MG tablet Take 10 mg by mouth daily.   Yes Historical Provider, MD  DULoxetine (CYMBALTA) 30 MG capsule TAKE 1 BY MOUTH DAILY 12/23/14  Yes Marletta Lor, MD  esomeprazole (NEXIUM) 40 MG capsule Take 1 capsule (40 mg total) by mouth daily before breakfast. 09/17/14  Yes Marletta Lor, MD  levothyroxine  (SYNTHROID, LEVOTHROID) 100 MCG tablet Take 1 tablet (100 mcg total) by mouth daily. 01/06/15  Yes Marletta Lor, MD  losartan-hydrochlorothiazide (HYZAAR) 100-25 MG per tablet Take 1 tablet by mouth daily. 02/26/15  Yes Marletta Lor, MD  montelukast (SINGULAIR) 10 MG tablet TAKE 1 BY MOUTH DAILY 03/30/15  Yes Marletta Lor, MD  Multiple Vitamin (MULTIVITAMIN) tablet Take 1 tablet by mouth daily.   Yes Historical Provider, MD  predniSONE (DELTASONE) 10 MG tablet 3 tablets twice daily for 2 days, then 2 tablets twice daily for 2 days,  then 1 tablet twice daily for 2 days, then one tablet daily  for 6 days 03/24/15  Yes Marletta Lor, MD  simvastatin (ZOCOR) 20 MG tablet Take 1 tablet (20 mg total) by mouth at bedtime. Patient taking differently: Take 20 mg by mouth daily with breakfast.  09/17/14  Yes Marletta Lor, MD  solifenacin (VESICARE) 10 MG tablet Take 1 tablet (10 mg total) by mouth daily. 02/25/15  Yes Marletta Lor, MD  traMADol (ULTRAM) 50 MG tablet Take 1 tablet (50 mg total) by mouth every 6 (six) hours as needed. Patient taking differently: Take 50 mg by mouth every 6 (six) hours as needed for moderate pain.  01/12/15  Yes Marletta Lor, MD  vitamin C (ASCORBIC ACID) 500 MG tablet Take 500 mg by mouth daily.   Yes Historical Provider, MD  albuterol (PROVENTIL HFA;VENTOLIN HFA) 108 (90 BASE) MCG/ACT inhaler Inhale 2 puffs into the lungs every 6 (six) hours as needed for wheezing. 01/08/13   Marletta Lor, MD   BP 121/70 mmHg  Pulse 100  Temp(Src) 97.6 F (36.4 C) (Oral)  Resp 23  SpO2 94% Physical Exam  Constitutional: She is oriented to person, place, and time. She appears well-developed and well-nourished. No distress.  HENT:  Head: Normocephalic and atraumatic.  Mouth/Throat: No oropharyngeal exudate.  Eyes: Pupils are equal, round, and reactive to light.  Neck: Normal range of motion. Neck supple.  Cardiovascular: Normal rate, regular  rhythm and normal heart sounds.  Exam reveals no gallop and no friction rub.   No murmur heard. Pulmonary/Chest: Effort normal. No respiratory distress. She has wheezes in the right lower field and the left lower field. She has no rales.  Abdominal: Soft. Bowel sounds are normal. She exhibits no distension and no mass. There is no tenderness. There is no rebound and no guarding.  Musculoskeletal: Normal range of motion. She exhibits no edema or tenderness.  Neurological: She is alert and oriented to person, place, and time.  Skin: Skin is warm and dry.  Psychiatric: She has a normal mood and affect.    ED Course  Procedures (including critical care time) Labs Review Labs Reviewed  BRAIN NATRIURETIC PEPTIDE - Abnormal; Notable for the following:    B Natriuretic Peptide 108.0 (*)    All other components within normal limits  D-DIMER, QUANTITATIVE - Abnormal; Notable for the following:    D-Dimer, Quant 3.75 (*)    All other components within normal limits  BASIC METABOLIC PANEL -  Abnormal; Notable for the following:    Sodium 134 (*)    Glucose, Bld 129 (*)    BUN 43 (*)    Creatinine, Ser 2.31 (*)    GFR calc non Af Amer 19 (*)    GFR calc Af Amer 22 (*)    All other components within normal limits  CBC - Abnormal; Notable for the following:    WBC 14.3 (*)    All other components within normal limits  I-STAT TROPOININ, ED  I-STAT CG4 LACTIC ACID, ED    Imaging Review Nm Pulmonary Perf And Vent  04/09/2015   CLINICAL DATA:  Shortness of breath with elevated D-dimer.  EXAM: NUCLEAR MEDICINE VENTILATION - PERFUSION LUNG SCAN  TECHNIQUE: Ventilation images were obtained in multiple projections using inhaled aerosol technetium 99 M DTPA. Perfusion images were obtained in multiple projections after intravenous injection of Tc-33m MAA.  RADIOPHARMACEUTICALS:  44.6 mCi Tc-13m DTPA aerosol and 5.8 mCi Tc-46m MAA  COMPARISON:  Chest x-ray today.  FINDINGS: Ventilation: No focal  ventilation defect.  Perfusion: No wedge shaped peripheral perfusion defects to suggest acute pulmonary embolism.  IMPRESSION: Normal ventilation perfusion lung scan.   Electronically Signed   By: Marin Olp M.D.   On: 04/09/2015 15:36   Dg Chest Portable 1 View  04/09/2015   CLINICAL DATA:  Shortness of breath and dizziness. Hypotension. History of asthma. Cough and congestion with wheezing.  EXAM: PORTABLE CHEST - 1 VIEW  COMPARISON:  10/26/2012.  FINDINGS: Cardiomediastinal silhouette within normal limits for the degree of inspiration. Mild basilar atelectasis without focal opacity, effusion, or pneumothorax. No acute osseous findings. Degenerative change LEFT AC joint.  IMPRESSION: No overt infiltrates or failure. Low lung volumes compared with prior radiograph.   Electronically Signed   By: Rolla Flatten M.D.   On: 04/09/2015 12:47     EKG Interpretation   Date/Time:  Thursday April 09 2015 11:31:57 EDT Ventricular Rate:  108 PR Interval:  118 QRS Duration: 95 QT Interval:  372 QTC Calculation: 499 R Axis:   58 Text Interpretation:  Sinus tachycardia Low voltage, precordial leads  Borderline prolonged QT interval Confirmed by DOCHERTY  MD, MEGAN [phone])  on 04/09/2015 4:05:19 PM      MDM   Final diagnoses:  Dyspnea  Positive D dimer  Wheezing    Pt is a 80 y.o. female with Pmhx as above who presents with about 1 week of cough, not improved by course of prednisone, now with increased shortness of breath, dizziness described as lightheadedness and wheezing which has worsened since yesterday.  She denies chest pain, she had one day of leg pain last week, but nonetheless several days.  She also denies fevers or chills.  Triage blood pressure initially 86/70.  The repeated is 108/70.  She has coarse breath sounds throughout with expiratory wheezes, minimal bilateral x-ray pitting edema.  EKG was sinus tachycardia.  Troponin is not elevated.  D-dimer is elevated, however, creatinine is  also elevated from baseline will not allow CT angiogram.  V/Q study done which was negative.  Patient has been given to do nebs without improvement of symptoms.  I'm unclear cause of symptoms that may be asthma exacerbation, which she has not had for some time.  Spoke with [facility].  He will admit for observation.      Ernestina Patches, MD 04/09/15 [phone]

## 2015-04-09 NOTE — H&P (Signed)
History and Physical  [name] [id number] DOB: January 05, 1935 DOA: 04/09/2015  Referring physician: Dr. Ernestina Patches, EDP PCP: Nyoka Cowden, MD  Outpatient Specialists:  1. Nephrology: Dr. Jamal Maes. 2. Asthma& Allergy: Dr. Leia Alf.  Chief Complaint: Cough, wheezing and difficulty breathing.  HPI: [name] is a 80 y.o. female with history of exertion induced asthma, HTN, HLD, hypothyroidism, GERD, stage IV chronic kidney disease and obesity presented to the [facility] ED on 04/09/15 with complaints of cough, wheezing and difficulty breathing. She gives 6-8 months history of intermittent nonproductive cough for unclear etiology. 2 weeks ago, she started having worsening cough, congestion and wheezing in the absence of fever or sputum production. She was seen by PCP on 03/24/15 and treated for asked to my exacerbation with a course of prednisone taper and symptomatic treatment. She states that she progressively felt better until yesterday when she finished her prednisone. Since last night she noticed worsening or nonproductive hacking cough, wheezing and dyspnea on minimal exertion. She denies sputum, fever, chills, chest pain, sore throat, headache, earache, dizziness or lightheadedness. She denies recent long distance travel. No cyclic contacts. She presented to the ED where initial blood pressure 86/60 (? Error. Probably transient), pulse 106/m and respiratory rate of 23. Chest x-ray was negative for infiltrates or heart failure. D-dimer was positive. This was followed by VQ scan which was negative. As per EDP, patient had oxygen saturation of 88% with minimal activity such as talking, on room air. Hospitalist admission requested.   Review of Systems: All systems reviewed and apart from history of presenting illness, are negative.  Past Medical History  Diagnosis Date  . ASTHMA 06/18/2007  . COLONIC POLYPS, HX OF 11/06/2009  . DYSPNEA ON EXERTION 05/06/2008  .  GERD 06/18/2007  . HYPERCHOLESTEROLEMIA, PURE 07/09/2007  . HYPERTENSION 06/18/2007  . HYPOTHYROIDISM 06/18/2007  . NEPHROLITHIASIS, HX OF 08/07/2008  . OSTEOARTHRITIS 05/07/2008  . RENAL INSUFFICIENCY 05/06/2008  . Goiter   . Obesity   . Insomnia   . Renal cyst    Past Surgical History  Procedure Laterality Date  . Abdominal hysterectomy    . Tonsillectomy    . Cataract extraction    . Knee arthroscopy    . Carpal tunnel release    . Total knee arthroplasty    . Lithotripsy     Social History:  reports that she quit smoking about 36 years ago. She has never used smokeless tobacco. She reports that she drinks alcohol. She reports that she does not use illicit drugs. Married. Lives with spouse. Independent of activities of daily living. Teaches CPR.  No Known Allergies  Family History  Problem Relation Age of Onset  . Hypertension Neg Hx     family  . Cancer Neg Hx     colon ca , prostate ca  . Heart Problems Father   . Multiple myeloma Other   . Colon cancer Other     Prior to Admission medications   Medication Sig Start Date End Date Taking? Authorizing Provider  beclomethasone (QVAR) 80 MCG/ACT inhaler Inhale 2 puffs into the lungs 2 (two) times daily. 04/06/15  Yes Marletta Lor, MD  cetirizine (ZYRTEC) 10 MG tablet Take 10 mg by mouth daily.   Yes Historical Provider, MD  DULoxetine (CYMBALTA) 30 MG capsule TAKE 1 BY MOUTH DAILY 12/23/14  Yes Marletta Lor, MD  esomeprazole (NEXIUM) 40 MG capsule Take 1 capsule (40 mg total) by mouth daily before breakfast. 09/17/14  Yes Marletta Lor, MD  levothyroxine (SYNTHROID, LEVOTHROID) 100 MCG tablet Take 1 tablet (100 mcg total) by mouth daily. 01/06/15  Yes Marletta Lor, MD  losartan-hydrochlorothiazide (HYZAAR) 100-25 MG per tablet Take 1 tablet by mouth daily. 02/26/15  Yes Marletta Lor, MD  montelukast (SINGULAIR) 10 MG tablet TAKE 1 BY MOUTH DAILY 03/30/15  Yes Marletta Lor, MD  Multiple  Vitamin (MULTIVITAMIN) tablet Take 1 tablet by mouth daily.   Yes Historical Provider, MD  predniSONE (DELTASONE) 10 MG tablet 3 tablets twice daily for 2 days, then 2 tablets twice daily for 2 days,  then 1 tablet twice daily for 2 days, then one tablet daily  for 6 days 03/24/15  Yes Marletta Lor, MD  simvastatin (ZOCOR) 20 MG tablet Take 1 tablet (20 mg total) by mouth at bedtime. Patient taking differently: Take 20 mg by mouth daily with breakfast.  09/17/14  Yes Marletta Lor, MD  solifenacin (VESICARE) 10 MG tablet Take 1 tablet (10 mg total) by mouth daily. 02/25/15  Yes Marletta Lor, MD  traMADol (ULTRAM) 50 MG tablet Take 1 tablet (50 mg total) by mouth every 6 (six) hours as needed. Patient taking differently: Take 50 mg by mouth every 6 (six) hours as needed for moderate pain.  01/12/15  Yes Marletta Lor, MD  vitamin C (ASCORBIC ACID) 500 MG tablet Take 500 mg by mouth daily.   Yes Historical Provider, MD  albuterol (PROVENTIL HFA;VENTOLIN HFA) 108 (90 BASE) MCG/ACT inhaler Inhale 2 puffs into the lungs every 6 (six) hours as needed for wheezing. 01/08/13   Marletta Lor, MD   Physical Exam: Filed Vitals:   04/09/15 1245 04/09/15 1321 04/09/15 1406 04/09/15 1544  BP:  104/68 148/62 121/70  Pulse: 105 108 101 100  Temp:      TempSrc:      Resp: _0 SpO2: 98% 92% 91% 94%   temperature: 97.44F.   General exam: Moderately built and obese female patient, lying comfortably propped up on the gurney in no obvious distress.  Head, eyes and ENT: Nontraumatic and normocephalic. Pupils equally reacting to light and accommodation. Oral mucosa moist. Throat exam without acute findings.  Neck: Supple. No JVD, carotid bruit or thyromegaly.  Lymphatics: No lymphadenopathy.  Respiratory system: Reduced breath sounds bilaterally with scattered bilateral medium pitched expiratory rhonchi and audible wheezing. No stridor. No increased work of breathing. Able to  speak in full sentences without difficulty.  Cardiovascular system: S1 and S2 heard, RRR. No JVD, murmurs, gallops, clicks or pedal edema.  Gastrointestinal system: Abdomen is obese/nondistended, soft and nontender. Normal bowel sounds heard. No organomegaly or masses appreciated.  Central nervous system: Alert and oriented. No focal neurological deficits.  Extremities: Symmetric 5 x 5 power. Peripheral pulses symmetrically felt.   Skin: No rashes or acute findings.  Musculoskeletal system: Negative exam.  Psychiatry: Pleasant and cooperative.   Labs on Admission:  Basic Metabolic Panel:  Recent Labs Lab 04/09/15 1151  NA 134*  K 4.1  CL 100  CO2 22  GLUCOSE 129*  BUN 43*  CREATININE 2.31*  CALCIUM 9.5   Liver Function Tests: No results for input(s): AST, ALT, ALKPHOS, BILITOT, PROT, ALBUMIN in the last 168 hours. No results for input(s): LIPASE, AMYLASE in the last 168 hours. No results for input(s): AMMONIA in the last 168 hours. CBC:  Recent Labs Lab 04/09/15 1151  WBC 14.3*  HGB 14.2  HCT 43.4  MCV  87.5  PLT 213   Cardiac Enzymes: No results for input(s): CKTOTAL, CKMB, CKMBINDEX, TROPONINI in the last 168 hours.  BNP (last 3 results) No results for input(s): PROBNP in the last 8760 hours. CBG: No results for input(s): GLUCAP in the last 168 hours.  Radiological Exams on Admission: Nm Pulmonary Perf And Vent  04/09/2015   CLINICAL DATA:  Shortness of breath with elevated D-dimer.  EXAM: NUCLEAR MEDICINE VENTILATION - PERFUSION LUNG SCAN  TECHNIQUE: Ventilation images were obtained in multiple projections using inhaled aerosol technetium 99 M DTPA. Perfusion images were obtained in multiple projections after intravenous injection of Tc-45mMAA.  RADIOPHARMACEUTICALS:  44.6 mCi Tc-95mTPA aerosol and 5.8 mCi Tc-9982mA  COMPARISON:  Chest x-ray today.  FINDINGS: Ventilation: No focal ventilation defect.  Perfusion: No wedge shaped peripheral perfusion  defects to suggest acute pulmonary embolism.  IMPRESSION: Normal ventilation perfusion lung scan.   Electronically Signed   By: DanMarin OlpD.   On: 04/09/2015 15:36   Dg Chest Portable 1 View  04/09/2015   CLINICAL DATA:  Shortness of breath and dizziness. Hypotension. History of asthma. Cough and congestion with wheezing.  EXAM: PORTABLE CHEST - 1 VIEW  COMPARISON:  10/26/2012.  FINDINGS: Cardiomediastinal silhouette within normal limits for the degree of inspiration. Mild basilar atelectasis without focal opacity, effusion, or pneumothorax. No acute osseous findings. Degenerative change LEFT AC joint.  IMPRESSION: No overt infiltrates or failure. Low lung volumes compared with prior radiograph.   Electronically Signed   By: JohRolla FlattenD.   On: 04/09/2015 12:47    EKG: Independently reviewed. Sinus tachycardia at 108 bpm, normal axis and no acute changes. Some intraventricular conduction delay. QTC 499 ms.  Assessment/Plan Principal Problem:   Asthma exacerbation - Likely precipitated by acute viral bronchitis. Other DD-GERD, VCD versus other etiologies. - Admit to telemetry - Treat empirically with oxygen supplementation, IV Solu-Medrol, bronchodilators and Tessalon Perles for hacking cough. - We'll also increase PPI to twice a day - Recommend outpatient pulmonology consultation for further evaluation including PFTs & for chronic cough.  Active Problems:   Acute respiratory failure with hypoxia - Secondary to asthma exacerbation - Nourishment as above    Acute renal failure superimposed on stage 4 chronic kidney disease - Baseline creatinine in the 1.8-1.9 range - Creatinine mildly worse at 2.31, possibly from HCTZ/ARB medication and poor oral intake. - Brief IV fluids and recheck BMP in a.m. - Follows with nephrology as outpatient    Hypothyroidism - Continue Synthroid    HYPERCHOLESTEROLEMIA, PURE - Continue statins    Essential hypertension - Continue home HCTZ/ARB  pending improvement in renal functions in a.m.    GERD - We will increase PPI to twice a day    Obesity    Chronic cough - We will need outpatient pulmonary consultation for further evaluation.   Elevated d-dimer - 3.75 - VQ scan negative - Patient gives history of lower extremity cramping couple nights ago and hence will request lower extremity venous Dopplers   Prolonged QTC - Monitor on telemetry - Avoid medications that could cause prolongation of QTC - Follow EKG in a.m. - Potassium 4 - Check magnesium        Code Status: Full  Family Communication: Discussed with patient's spouse and son at bedside  Disposition Plan: Home when medically stable, possibly in 2-3 days   Time spent: 55 59nutes  Adonia Porada, MD, FACP, FHM. [facility] Pager [phone] 7PM-7AM, please contact night-coverage [email or web address]  Password [email or web address] 04/09/2015, 4:45 PM

## 2015-04-10 DIAGNOSIS — N184 Chronic kidney disease, stage 4 (severe): Secondary | ICD-10-CM | POA: Diagnosis present

## 2015-04-10 DIAGNOSIS — Z800 Family history of malignant neoplasm of digestive organs: Secondary | ICD-10-CM | POA: Diagnosis not present

## 2015-04-10 DIAGNOSIS — E039 Hypothyroidism, unspecified: Secondary | ICD-10-CM | POA: Diagnosis present

## 2015-04-10 DIAGNOSIS — J208 Acute bronchitis due to other specified organisms: Secondary | ICD-10-CM | POA: Diagnosis present

## 2015-04-10 DIAGNOSIS — K219 Gastro-esophageal reflux disease without esophagitis: Secondary | ICD-10-CM | POA: Diagnosis present

## 2015-04-10 DIAGNOSIS — J45901 Unspecified asthma with (acute) exacerbation: Secondary | ICD-10-CM | POA: Diagnosis present

## 2015-04-10 DIAGNOSIS — Z87442 Personal history of urinary calculi: Secondary | ICD-10-CM | POA: Diagnosis not present

## 2015-04-10 DIAGNOSIS — I10 Essential (primary) hypertension: Secondary | ICD-10-CM

## 2015-04-10 DIAGNOSIS — Z79899 Other long term (current) drug therapy: Secondary | ICD-10-CM | POA: Diagnosis not present

## 2015-04-10 DIAGNOSIS — R062 Wheezing: Secondary | ICD-10-CM | POA: Diagnosis present

## 2015-04-10 DIAGNOSIS — I129 Hypertensive chronic kidney disease with stage 1 through stage 4 chronic kidney disease, or unspecified chronic kidney disease: Secondary | ICD-10-CM | POA: Diagnosis present

## 2015-04-10 DIAGNOSIS — E669 Obesity, unspecified: Secondary | ICD-10-CM | POA: Diagnosis present

## 2015-04-10 DIAGNOSIS — Z87891 Personal history of nicotine dependence: Secondary | ICD-10-CM | POA: Diagnosis not present

## 2015-04-10 DIAGNOSIS — I4581 Long QT syndrome: Secondary | ICD-10-CM | POA: Diagnosis present

## 2015-04-10 DIAGNOSIS — T502X5A Adverse effect of carbonic-anhydrase inhibitors, benzothiadiazides and other diuretics, initial encounter: Secondary | ICD-10-CM | POA: Diagnosis present

## 2015-04-10 DIAGNOSIS — Z6841 Body Mass Index (BMI) 40.0 and over, adult: Secondary | ICD-10-CM | POA: Diagnosis not present

## 2015-04-10 DIAGNOSIS — J9601 Acute respiratory failure with hypoxia: Secondary | ICD-10-CM | POA: Diagnosis present

## 2015-04-10 DIAGNOSIS — R0602 Shortness of breath: Secondary | ICD-10-CM | POA: Diagnosis not present

## 2015-04-10 DIAGNOSIS — E785 Hyperlipidemia, unspecified: Secondary | ICD-10-CM | POA: Diagnosis present

## 2015-04-10 DIAGNOSIS — Z86010 Personal history of colonic polyps: Secondary | ICD-10-CM | POA: Diagnosis not present

## 2015-04-10 DIAGNOSIS — E780 Pure hypercholesterolemia: Secondary | ICD-10-CM | POA: Diagnosis present

## 2015-04-10 DIAGNOSIS — N179 Acute kidney failure, unspecified: Secondary | ICD-10-CM | POA: Diagnosis present

## 2015-04-10 LAB — BASIC METABOLIC PANEL
Anion gap: 8 (ref 5–15)
BUN: 44 mg/dL — ABNORMAL HIGH (ref 6–23)
CALCIUM: 8.9 mg/dL (ref 8.4–10.5)
CO2: 22 mmol/L (ref 19–32)
Chloride: 103 mmol/L (ref 96–112)
Creatinine, Ser: 2.1 mg/dL — ABNORMAL HIGH (ref 0.50–1.10)
GFR calc Af Amer: 25 mL/min — ABNORMAL LOW (ref >90)
GFR calc non Af Amer: 22 mL/min — ABNORMAL LOW (ref >90)
GLUCOSE: 180 mg/dL — AB (ref 70–99)
Potassium: 4.2 mmol/L (ref 3.5–5.1)
Sodium: 133 mmol/L — ABNORMAL LOW (ref 135–145)

## 2015-04-10 MED ORDER — METHYLPREDNISOLONE SODIUM SUCC 125 MG IJ SOLR
60.0000 mg | Freq: Two times a day (BID) | INTRAMUSCULAR | Status: DC
  Administered 2015-04-10 – 2015-04-11 (×2): 60 mg via INTRAVENOUS
  Filled 2015-04-10 (×2): qty 2

## 2015-04-10 MED ORDER — MAGNESIUM OXIDE 400 (241.3 MG) MG PO TABS
400.0000 mg | ORAL_TABLET | Freq: Every day | ORAL | Status: DC
  Administered 2015-04-10 – 2015-04-12 (×3): 400 mg via ORAL
  Filled 2015-04-10 (×3): qty 1

## 2015-04-10 MED ORDER — SODIUM CHLORIDE 0.9 % IV SOLN
INTRAVENOUS | Status: AC
  Administered 2015-04-10: 15:00:00 via INTRAVENOUS

## 2015-04-10 NOTE — Progress Notes (Signed)
SATURATION QUALIFICATIONS: (This note is used to comply with regulatory documentation for home oxygen)  Patient Saturations on Room Air at Rest = 93%  Patient Saturations on Room Air while Ambulating = 93%  Patient Saturations on zero Liters of oxygen while Ambulating = 93%  Please briefly explain why patient needs home oxygen:

## 2015-04-10 NOTE — Progress Notes (Addendum)
*  Preliminary Results* Bilateral lower extremity venous duplex completed. Bilateral lower extremities are negative for deep vein thrombosis. There is evidence of a complex right Baker's cyst.  04/10/2015  Maudry Mayhew, RVT, RDCS, RDMS

## 2015-04-10 NOTE — Progress Notes (Signed)
UR completed 

## 2015-04-10 NOTE — Progress Notes (Signed)
[facility] PROGRESS NOTE  [name] [id number] DOB: 09-Jan-1935 DOA: 04/09/2015  PCP: Nyoka Cowden, MD  Brief HPI: 80 year old Caucasian female presented with cough, wheezing and difficulty breathing. She was admitted for further management of acute asthma exacerbation.  Past medical history:  Past Medical History  Diagnosis Date  . ASTHMA 06/18/2007  . COLONIC POLYPS, HX OF 11/06/2009  . DYSPNEA ON EXERTION 05/06/2008  . GERD 06/18/2007  . HYPERCHOLESTEROLEMIA, PURE 07/09/2007  . HYPERTENSION 06/18/2007  . HYPOTHYROIDISM 06/18/2007  . NEPHROLITHIASIS, HX OF 08/07/2008  . OSTEOARTHRITIS 05/07/2008  . RENAL INSUFFICIENCY 05/06/2008  . Goiter   . Obesity   . Insomnia   . Renal cyst     Consultants: none  Procedures:  Lower extremity venous Doppler Negative for DVT. Right-sided Baker's cyst noted.  Antibiotics: none  Subjective: Patient feels much better this morning. Breathing is improved. Wheezing is much better. Denies any chest pain, dizziness. No nausea, vomiting. Wants to ambulate.  Objective: Vital Signs  Filed Vitals:   04/09/15 2015 04/09/15 2156 04/10/15 0602 04/10/15 0839  BP:  118/65 130/89   Pulse:  92 81   Temp:  97.5 F (36.4 C) 97.3 F (36.3 C)   TempSrc:  Oral Oral   Resp:  16 16   Height:      Weight:      SpO2: 95% 96% 98% 93%    Intake/Output Summary (Last 24 hours) at 04/10/15 0908 Last data filed at 04/10/15 0600  Gross per 24 hour  Intake    755 ml  Output      0 ml  Net    755 ml   Filed Weights   04/09/15 1730  Weight: 110.3 kg (243 lb 2.7 oz)    General appearance: alert, cooperative, appears stated age and no distress Resp: scattered wheezing bilaterally, good air entry. Cardio: regular rate and rhythm, S1, S2 normal, no murmur, click, rub or gallop GI: soft, non-tender; bowel sounds normal; no masses,  no organomegaly Extremities: extremities normal, atraumatic, no cyanosis or edema Neurologic: no focal  deficits  Lab Results:  Basic Metabolic Panel:  Recent Labs Lab 04/09/15 1151 04/10/15 0546  NA 134* 133*  K 4.1 4.2  CL 100 103  CO2 22 22  GLUCOSE 129* 180*  BUN 43* 44*  CREATININE 2.31* 2.10*  CALCIUM 9.5 8.9  MG 1.5  --    CBC:  Recent Labs Lab 04/09/15 1151  WBC 14.3*  HGB 14.2  HCT 43.4  MCV 87.5  PLT 213   BNP (last 3 results)  Recent Labs  04/09/15 1151  BNP 108.0*     Studies/Results: Nm Pulmonary Perf And Vent  04/09/2015   CLINICAL DATA:  Shortness of breath with elevated D-dimer.  EXAM: NUCLEAR MEDICINE VENTILATION - PERFUSION LUNG SCAN  TECHNIQUE: Ventilation images were obtained in multiple projections using inhaled aerosol technetium 99 M DTPA. Perfusion images were obtained in multiple projections after intravenous injection of Tc-33m MAA.  RADIOPHARMACEUTICALS:  44.6 mCi Tc-104m DTPA aerosol and 5.8 mCi Tc-72m MAA  COMPARISON:  Chest x-ray today.  FINDINGS: Ventilation: No focal ventilation defect.  Perfusion: No wedge shaped peripheral perfusion defects to suggest acute pulmonary embolism.  IMPRESSION: Normal ventilation perfusion lung scan.   Electronically Signed   By: Marin Olp M.D.   On: 04/09/2015 15:36   Dg Chest Portable 1 View  04/09/2015   CLINICAL DATA:  Shortness of breath and dizziness. Hypotension. History of asthma. Cough and congestion with wheezing.  EXAM: PORTABLE CHEST - 1 VIEW  COMPARISON:  10/26/2012.  FINDINGS: Cardiomediastinal silhouette within normal limits for the degree of inspiration. Mild basilar atelectasis without focal opacity, effusion, or pneumothorax. No acute osseous findings. Degenerative change LEFT AC joint.  IMPRESSION: No overt infiltrates or failure. Low lung volumes compared with prior radiograph.   Electronically Signed   By: Rolla Flatten M.D.   On: 04/09/2015 12:47    Medications:  Scheduled: . benzonatate  200 mg Oral TID  . darifenacin  15 mg Oral Daily  . DULoxetine  30 mg Oral Daily  .  enoxaparin (LOVENOX) injection  30 mg Subcutaneous Q24H  . guaiFENesin  1,200 mg Oral BID  . losartan  100 mg Oral Daily   And  . hydrochlorothiazide  25 mg Oral Daily  . ipratropium-albuterol  3 mL Nebulization TID  . levothyroxine  100 mcg Oral Daily  . loratadine  10 mg Oral Daily  . methylPREDNISolone (SOLU-MEDROL) injection  60 mg Intravenous Q12H  . montelukast  10 mg Oral Daily  . pantoprazole  40 mg Oral BID AC  . simvastatin  20 mg Oral Q breakfast  . sodium chloride  3 mL Intravenous Q12H   Continuous:  [id number] **OR** acetaminophen, albuterol, alum & mag hydroxide-simeth, guaiFENesin-dextromethorphan, traMADol  Assessment/Plan:  Principal Problem:   Asthma exacerbation Active Problems:   Hypothyroidism   HYPERCHOLESTEROLEMIA, PURE   Essential hypertension   GERD   Obesity   Acute respiratory failure with hypoxia   Chronic cough   Acute renal failure superimposed on stage 4 chronic kidney disease    Acute Asthma exacerbation Likely precipitated by acute viral bronchitis. Much improved after breathing treatments, steroids. Cut back on steroid dose. Otherwise, continue current management. Recommend outpatient pulmonology consultation for further evaluation including PFTs & for chronic cough.  Acute respiratory failure with hypoxia Secondary to asthma exacerbation. Management as above. VQ scan was negative for pulmonary embolism.  Acute renal failure superimposed on stage 4 chronic kidney disease Baseline creatinine in the 1.8-1.9 range. Acute renal failure likely due to hydrochlorothiazide and ARB. Poor oral intake may have also contributed. Improved today with IV fluids, which will be continued for now. Repeat labs tomorrow morning.  Hypothyroidism Continue Synthroid  HYPERCHOLESTEROLEMIA, PURE Continue statins  Essential hypertension Hold the diuretic and ARB for now. Blood pressure reasonably well controlled.  GERD PPI increased to twice a  day.  Obesity Management as outpatient  Chronic cough Will need outpatient pulmonary consultation for further evaluation. She will discuss this further with her PCP.  Elevated d-dimer VQ scan negative. No DVT on lower extremity Dopplers.  Prolonged QTC Monitor. Magnesium in low normal range.Will give orally.  DVT Prophylaxis: Lovenox    Code Status: full code  Family Communication: discussed with patient  Disposition Plan: mobilize. Anticipate discharge tomorrow morning.     [facility] Pager [phone] 04/10/2015, 9:08 AM  If 7PM-7AM, please contact night-coverage at [email or web address], password [facility]

## 2015-04-11 LAB — CBC
HCT: 37 % (ref 36.0–46.0)
Hemoglobin: 12 g/dL (ref 12.0–15.0)
MCH: 28.1 pg (ref 26.0–34.0)
MCHC: 32.4 g/dL (ref 30.0–36.0)
MCV: 86.7 fL (ref 78.0–100.0)
Platelets: 180 10*3/uL (ref 150–400)
RBC: 4.27 MIL/uL (ref 3.87–5.11)
RDW: 15 % (ref 11.5–15.5)
WBC: 10.7 10*3/uL — AB (ref 4.0–10.5)

## 2015-04-11 LAB — BASIC METABOLIC PANEL
Anion gap: 9 (ref 5–15)
BUN: 51 mg/dL — ABNORMAL HIGH (ref 6–23)
CO2: 24 mmol/L (ref 19–32)
Calcium: 9.5 mg/dL (ref 8.4–10.5)
Chloride: 103 mmol/L (ref 96–112)
Creatinine, Ser: 2.17 mg/dL — ABNORMAL HIGH (ref 0.50–1.10)
GFR calc Af Amer: 24 mL/min — ABNORMAL LOW (ref >90)
GFR, EST NON AFRICAN AMERICAN: 21 mL/min — AB (ref >90)
Glucose, Bld: 160 mg/dL — ABNORMAL HIGH (ref 70–99)
POTASSIUM: 4.2 mmol/L (ref 3.5–5.1)
Sodium: 136 mmol/L (ref 135–145)

## 2015-04-11 MED ORDER — PREDNISONE 20 MG PO TABS
60.0000 mg | ORAL_TABLET | Freq: Every day | ORAL | Status: DC
  Administered 2015-04-11 – 2015-04-12 (×2): 60 mg via ORAL
  Filled 2015-04-11 (×2): qty 3

## 2015-04-11 MED ORDER — SODIUM CHLORIDE 0.9 % IV SOLN
INTRAVENOUS | Status: AC
  Administered 2015-04-11: 12:00:00 via INTRAVENOUS

## 2015-04-11 NOTE — Progress Notes (Signed)
Pt tolerated walk fairly.  Some SOB but o2 sats were 95%.  Heard some expiratory wheezing on ascultation.  HR in 120's with exertion.  MD made aware.

## 2015-04-11 NOTE — Progress Notes (Signed)
[facility] PROGRESS NOTE  OZIE MEDVEDEV [id number] DOB: October 18, 1935 DOA: 04/09/2015  PCP: Nyoka Cowden, MD  Brief HPI: 80 year old Caucasian female presented with cough, wheezing and difficulty breathing. She was admitted for further management of acute asthma exacerbation.  Past medical history:  Past Medical History  Diagnosis Date  . ASTHMA 06/18/2007  . COLONIC POLYPS, HX OF 11/06/2009  . DYSPNEA ON EXERTION 05/06/2008  . GERD 06/18/2007  . HYPERCHOLESTEROLEMIA, PURE 07/09/2007  . HYPERTENSION 06/18/2007  . HYPOTHYROIDISM 06/18/2007  . NEPHROLITHIASIS, HX OF 08/07/2008  . OSTEOARTHRITIS 05/07/2008  . RENAL INSUFFICIENCY 05/06/2008  . Goiter   . Obesity   . Insomnia   . Renal cyst     Consultants: none  Procedures:  Lower extremity venous Doppler Negative for DVT. Right-sided Baker's cyst noted.  Antibiotics: none  Subjective: Patient had some wheezing overnight. Doesn't feel as well. This morning, as she did yesterday. Denies any chest pain.   Objective: Vital Signs  Filed Vitals:   04/10/15 1945 04/10/15 2119 04/10/15 2137 04/11/15 0443  BP:  153/89 128/95 142/73  Pulse:  104  94  Temp:  97.9 F (36.6 C)  97.5 F (36.4 C)  TempSrc:  Oral  Oral  Resp:  16  16  Height:      Weight:      SpO2: 98% 99%  99%    Intake/Output Summary (Last 24 hours) at 04/11/15 0805 Last data filed at 04/10/15 2200  Gross per 24 hour  Intake 339.17 ml  Output      0 ml  Net 339.17 ml   Filed Weights   04/09/15 1730  Weight: 110.3 kg (243 lb 2.7 oz)    General appearance: alert, cooperative, appears stated age and no distress Resp: scattered end expiratory wheezing bilaterally, good air entry. Cardio: regular rate and rhythm, S1, S2 normal, no murmur, click, rub or gallop GI: soft, non-tender; bowel sounds normal; no masses,  no organomegaly Neurologic: no focal deficits  Lab Results:  Basic Metabolic Panel:  Recent Labs Lab 04/09/15 1151  04/10/15 0546 04/11/15 0426  NA 134* 133* 136  K 4.1 4.2 4.2  CL 100 103 103  CO2 22 22 24   GLUCOSE 129* 180* 160*  BUN 43* 44* 51*  CREATININE 2.31* 2.10* 2.17*  CALCIUM 9.5 8.9 9.5  MG 1.5  --   --    CBC:  Recent Labs Lab 04/09/15 1151 04/11/15 0426  WBC 14.3* 10.7*  HGB 14.2 12.0  HCT 43.4 37.0  MCV 87.5 86.7  PLT 213 180   BNP (last 3 results)  Recent Labs  04/09/15 1151  BNP 108.0*     Studies/Results: Nm Pulmonary Perf And Vent  04/09/2015   CLINICAL DATA:  Shortness of breath with elevated D-dimer.  EXAM: NUCLEAR MEDICINE VENTILATION - PERFUSION LUNG SCAN  TECHNIQUE: Ventilation images were obtained in multiple projections using inhaled aerosol technetium 99 M DTPA. Perfusion images were obtained in multiple projections after intravenous injection of Tc-66m MAA.  RADIOPHARMACEUTICALS:  44.6 mCi Tc-60m DTPA aerosol and 5.8 mCi Tc-46m MAA  COMPARISON:  Chest x-ray today.  FINDINGS: Ventilation: No focal ventilation defect.  Perfusion: No wedge shaped peripheral perfusion defects to suggest acute pulmonary embolism.  IMPRESSION: Normal ventilation perfusion lung scan.   Electronically Signed   By: Marin Olp M.D.   On: 04/09/2015 15:36   Dg Chest Portable 1 View  04/09/2015   CLINICAL DATA:  Shortness of breath and dizziness. Hypotension. History of asthma. Cough  and congestion with wheezing.  EXAM: PORTABLE CHEST - 1 VIEW  COMPARISON:  10/26/2012.  FINDINGS: Cardiomediastinal silhouette within normal limits for the degree of inspiration. Mild basilar atelectasis without focal opacity, effusion, or pneumothorax. No acute osseous findings. Degenerative change LEFT AC joint.  IMPRESSION: No overt infiltrates or failure. Low lung volumes compared with prior radiograph.   Electronically Signed   By: Rolla Flatten M.D.   On: 04/09/2015 12:47    Medications:  Scheduled: . benzonatate  200 mg Oral TID  . darifenacin  15 mg Oral Daily  . DULoxetine  30 mg Oral Daily  .  enoxaparin (LOVENOX) injection  30 mg Subcutaneous Q24H  . guaiFENesin  1,200 mg Oral BID  . ipratropium-albuterol  3 mL Nebulization TID  . levothyroxine  100 mcg Oral Daily  . loratadine  10 mg Oral Daily  . magnesium oxide  400 mg Oral Daily  . montelukast  10 mg Oral Daily  . pantoprazole  40 mg Oral BID AC  . predniSONE  60 mg Oral Q breakfast  . simvastatin  20 mg Oral Q breakfast  . sodium chloride  3 mL Intravenous Q12H   Continuous:  [id number] **OR** acetaminophen, albuterol, alum & mag hydroxide-simeth, guaiFENesin-dextromethorphan, traMADol  Assessment/Plan:  Principal Problem:   Asthma exacerbation Active Problems:   Hypothyroidism   HYPERCHOLESTEROLEMIA, PURE   Essential hypertension   GERD   Obesity   Acute respiratory failure with hypoxia   Chronic cough   Acute renal failure superimposed on stage 4 chronic kidney disease    Acute Asthma exacerbation Likely precipitated by acute viral bronchitis. She had improved yesterday. But appears to have had a bit of a setback today. She doesn't feel as well as yesterday. Some wheezing heard. Continue with current treatment. Continue to taper steroids. Recommend outpatient pulmonology consultation for further evaluation including PFTs & for chronic cough.  Acute respiratory failure with hypoxia Secondary to asthma exacerbation. Management as above. VQ scan was negative for pulmonary embolism.  Acute renal failure superimposed on stage 4 chronic kidney disease Baseline creatinine in the 1.8-2.0 range. Renal function is close to baseline. BUN is higher. Continue with gentle IV hydration for now. Continue to hold hydrochlorothiazide and ARB. Repeat labs tomorrow morning.  Hypothyroidism Continue Synthroid  HYPERCHOLESTEROLEMIA, PURE Continue statins  Essential hypertension Hold the diuretic and ARB for now. Blood pressure reasonably well controlled.  GERD PPI increased to twice a day.  Obesity Management  as outpatient  Chronic cough Will need outpatient pulmonary consultation for further evaluation. She will discuss this further with her PCP.  Elevated d-dimer VQ scan negative. No DVT on lower extremity Dopplers.  Prolonged QTC Monitor. Magnesium in low normal range.Will give orally.  DVT Prophylaxis: Lovenox    Code Status: full code  Family Communication: discussed with patient  Disposition Plan: Setback today. Continue current treatment otherwise. Reevaluate tomorrow.   LOS: 1 day   [facility] Pager [phone] 04/11/2015, 8:05 AM  If 7PM-7AM, please contact night-coverage at [email or web address], password [facility]

## 2015-04-12 LAB — BASIC METABOLIC PANEL
Anion gap: 9 (ref 5–15)
BUN: 52 mg/dL — ABNORMAL HIGH (ref 6–23)
CHLORIDE: 104 mmol/L (ref 96–112)
CO2: 23 mmol/L (ref 19–32)
Calcium: 9.5 mg/dL (ref 8.4–10.5)
Creatinine, Ser: 1.91 mg/dL — ABNORMAL HIGH (ref 0.50–1.10)
GFR calc Af Amer: 28 mL/min — ABNORMAL LOW (ref >90)
GFR calc non Af Amer: 24 mL/min — ABNORMAL LOW (ref >90)
GLUCOSE: 145 mg/dL — AB (ref 70–99)
POTASSIUM: 3.7 mmol/L (ref 3.5–5.1)
SODIUM: 136 mmol/L (ref 135–145)

## 2015-04-12 MED ORDER — AMLODIPINE BESYLATE 5 MG PO TABS
5.0000 mg | ORAL_TABLET | Freq: Every day | ORAL | Status: DC
  Administered 2015-04-12: 5 mg via ORAL
  Filled 2015-04-12: qty 1

## 2015-04-12 MED ORDER — PREDNISONE 20 MG PO TABS: ORAL_TABLET | ORAL | Status: DC

## 2015-04-12 MED ORDER — ALBUTEROL SULFATE (2.5 MG/3ML) 0.083% IN NEBU: 2.5000 mg | INHALATION_SOLUTION | RESPIRATORY_TRACT | Status: DC | PRN

## 2015-04-12 MED ORDER — AMLODIPINE BESYLATE 5 MG PO TABS: 5.0000 mg | ORAL_TABLET | Freq: Every day | ORAL | Status: DC

## 2015-04-12 MED ORDER — BENZONATATE 200 MG PO CAPS: 200.0000 mg | ORAL_CAPSULE | Freq: Three times a day (TID) | ORAL | Status: DC | PRN

## 2015-04-12 NOTE — Discharge Summary (Signed)
[facility]  Physician Discharge Summary   Patient ID: [name] MRN: [id number] DOB/AGE: 08-29-1935 80 y.o.  Admit date: 04/09/2015 Discharge date: 04/12/2015  PCP: Nyoka Cowden, MD  DISCHARGE DIAGNOSES:  Principal Problem:   Asthma exacerbation Active Problems:   Hypothyroidism   HYPERCHOLESTEROLEMIA, PURE   Essential hypertension   GERD   Obesity   Acute respiratory failure with hypoxia   Chronic cough   Acute renal failure superimposed on stage 4 chronic kidney disease   RECOMMENDATIONS FOR OUTPATIENT FOLLOW UP: 1. Her losartan and hydrochlorothiazide has been held for now due to her renal insufficiency 2. She has been started on amlodipine instead  3. Please consider referral to pulmonology for her ongoing respiratory symptoms   DISCHARGE CONDITION: fair  Diet: low-sodium  Filed Weights   04/09/15 1730  Weight: 110.3 kg (243 lb 2.7 oz)    INITIAL HISTORY: 80 year old Caucasian female presented with cough, wheezing and difficulty breathing. She was admitted for further management of acute asthma exacerbation.  HOSPITAL COURSE:   Acute Asthma exacerbation This was likely precipitated by acute viral bronchitis. Patient was started on steroids, nebulizer treatments. She slowly improved. She has been ambulating without difficulty. Saturating well on room air. She'll be discharged on steroid taper. Nebulizer machine and albuterol nebulizer solution will be prescribed. She is already on cetirizine and Singulair along with inhaled steroids. Apparently has had problems with the wheezing for the past few months when they have been occurring more frequently. I have asked her to discuss this issue with her PCP and consider referral to pulmonology for further evaluation including PFTs & for chronic cough.  Acute respiratory failure with hypoxia Resolved. This was secondary to asthma exacerbation. Management as above. VQ scan was negative for pulmonary  embolism.  Acute renal failure superimposed on stage 4 chronic kidney disease Baseline creatinine in the 1.8-2.0 range. Her current renal function is close to baseline. BUN is higher. She was gently hydrated. We will hold her hydrochlorothiazide and ARB. She should follow-up with her PCP with the same. She also is followed by nephrologist, Dr. Lorrene Reid.  Hypothyroidism Continue Synthroid  HYPERCHOLESTEROLEMIA, PURE Continue statins  Essential hypertension Hold the diuretic and ARB for now. Amlodipine has been initiated instead.  GERD PPI  Obesity Management as outpatient  Elevated d-dimer VQ scan negative. No DVT on lower extremity Dopplers.  Prolonged QTC Monitor. Magnesium in low normal range. This was repleted orally.  Overall, stable. Okay for discharge home today. Nebulizer machine to be ordered. Patient has a follow-up appointment with her PCP.   PERTINENT LABS:  The results of significant diagnostics from this hospitalization (including imaging, microbiology, ancillary and laboratory) are listed below for reference.     Labs: Basic Metabolic Panel:  Recent Labs Lab 04/09/15 1151 04/10/15 0546 04/11/15 0426 04/12/15 0507  NA 134* 133* 136 136  K 4.1 4.2 4.2 3.7  CL 100 103 103 104  CO2 22 22 24 23   GLUCOSE 129* 180* 160* 145*  BUN 43* 44* 51* 52*  CREATININE 2.31* 2.10* 2.17* 1.91*  CALCIUM 9.5 8.9 9.5 9.5  MG 1.5  --   --   --    CBC:  Recent Labs Lab 04/09/15 1151 04/11/15 0426  WBC 14.3* 10.7*  HGB 14.2 12.0  HCT 43.4 37.0  MCV 87.5 86.7  PLT 213 180   BNP: BNP (last 3 results)  Recent Labs  04/09/15 1151  BNP 108.0*    IMAGING STUDIES Nm Pulmonary Perf And Vent  04/09/2015   CLINICAL DATA:  Shortness of breath with elevated D-dimer.  EXAM: NUCLEAR MEDICINE VENTILATION - PERFUSION LUNG SCAN  TECHNIQUE: Ventilation images were obtained in multiple projections using inhaled aerosol technetium 99 M DTPA. Perfusion images were obtained in  multiple projections after intravenous injection of Tc-97m MAA.  RADIOPHARMACEUTICALS:  44.6 mCi Tc-17m DTPA aerosol and 5.8 mCi Tc-73m MAA  COMPARISON:  Chest x-ray today.  FINDINGS: Ventilation: No focal ventilation defect.  Perfusion: No wedge shaped peripheral perfusion defects to suggest acute pulmonary embolism.  IMPRESSION: Normal ventilation perfusion lung scan.   Electronically Signed   By: Marin Olp M.D.   On: 04/09/2015 15:36   Dg Chest Portable 1 View  04/09/2015   CLINICAL DATA:  Shortness of breath and dizziness. Hypotension. History of asthma. Cough and congestion with wheezing.  EXAM: PORTABLE CHEST - 1 VIEW  COMPARISON:  10/26/2012.  FINDINGS: Cardiomediastinal silhouette within normal limits for the degree of inspiration. Mild basilar atelectasis without focal opacity, effusion, or pneumothorax. No acute osseous findings. Degenerative change LEFT AC joint.  IMPRESSION: No overt infiltrates or failure. Low lung volumes compared with prior radiograph.   Electronically Signed   By: Rolla Flatten M.D.   On: 04/09/2015 12:47    DISCHARGE EXAMINATION: Filed Vitals:   04/11/15 2053 04/12/15 0443 04/12/15 0940 04/12/15 1040  BP: 158/67 148/86  150/88  Pulse: 106 96    Temp: 97.8 F (36.6 C) 98.1 F (36.7 C)    TempSrc: Oral Oral    Resp: 16 16    Height:      Weight:      SpO2: 99% 96% 98%    General appearance: alert, cooperative, appears stated age and no distress Resp: clear to auscultation bilaterally Cardio: regular rate and rhythm, S1, S2 normal, no murmur, click, rub or gallop GI: soft, non-tender; bowel sounds normal; no masses,  no organomegaly Extremities: extremities normal, atraumatic, no cyanosis or edema Neurologic: Alert and oriented X 3, normal strength and tone. Normal symmetric reflexes. Normal coordination and gait  DISPOSITION: Home  Discharge Instructions    Call MD for:  difficulty breathing, headache or visual disturbances    Complete by:  As directed       Call MD for:  extreme fatigue    Complete by:  As directed      Call MD for:  hives    Complete by:  As directed      Call MD for:  persistant dizziness or light-headedness    Complete by:  As directed      Call MD for:  persistant nausea and vomiting    Complete by:  As directed      Call MD for:  temperature >100.4    Complete by:  As directed      Diet - low sodium heart healthy    Complete by:  As directed      Discharge instructions    Complete by:  As directed   Please keep your appointment. Ask your doctor about your new blood pressure medication and about referral to Pulmonology.  You were cared for by a hospitalist during your hospital stay. If you have any questions about your discharge medications or the care you received while you were in the hospital after you are discharged, you can call the unit and asked to speak with the hospitalist on call if the hospitalist that took care of you is not available. Once you are discharged, your primary care physician will  handle any further medical issues. Please note that NO REFILLS for any discharge medications will be authorized once you are discharged, as it is imperative that you return to your primary care physician (or establish a relationship with a primary care physician if you do not have one) for your aftercare needs so that they can reassess your need for medications and monitor your lab values. If you do not have a primary care physician, you can call [phone] for a physician referral.     Increase activity slowly    Complete by:  As directed            ALLERGIES: No Known Allergies   Current Discharge Medication List    START taking these medications   Details  albuterol (PROVENTIL) (2.5 MG/3ML) 0.083% nebulizer solution Take 3 mLs (2.5 mg total) by nebulization every 4 (four) hours as needed for wheezing. Qty: 75 mL, Refills: 12    amLODipine (NORVASC) 5 MG tablet Take 1 tablet (5 mg total) by mouth daily. Qty:  30 tablet, Refills: 0    benzonatate (TESSALON) 200 MG capsule Take 1 capsule (200 mg total) by mouth 3 (three) times daily as needed for cough. Qty: 20 capsule, Refills: 0      CONTINUE these medications which have CHANGED   Details  predniSONE (DELTASONE) 20 MG tablet Take 3 tablets once daily for 3 days, then take 2 tablets once daily for 4 days, then take 1 tablet once daily for 4 days, then STOP Qty: 21 tablet, Refills: 0      CONTINUE these medications which have NOT CHANGED   Details  beclomethasone (QVAR) 80 MCG/ACT inhaler Inhale 2 puffs into the lungs 2 (two) times daily. Qty: 3 Inhaler, Refills: 3    cetirizine (ZYRTEC) 10 MG tablet Take 10 mg by mouth daily.    DULoxetine (CYMBALTA) 30 MG capsule TAKE 1 BY MOUTH DAILY Qty: 90 capsule, Refills: 1    esomeprazole (NEXIUM) 40 MG capsule Take 1 capsule (40 mg total) by mouth daily before breakfast. Qty: 90 capsule, Refills: 3    levothyroxine (SYNTHROID, LEVOTHROID) 100 MCG tablet Take 1 tablet (100 mcg total) by mouth daily. Qty: 90 tablet, Refills: 3    montelukast (SINGULAIR) 10 MG tablet TAKE 1 BY MOUTH DAILY Qty: 90 tablet, Refills: 1    Multiple Vitamin (MULTIVITAMIN) tablet Take 1 tablet by mouth daily.    simvastatin (ZOCOR) 20 MG tablet Take 1 tablet (20 mg total) by mouth at bedtime. Qty: 90 tablet, Refills: 3    solifenacin (VESICARE) 10 MG tablet Take 1 tablet (10 mg total) by mouth daily. Qty: 90 tablet, Refills: 0    traMADol (ULTRAM) 50 MG tablet Take 1 tablet (50 mg total) by mouth every 6 (six) hours as needed. Qty: 90 tablet, Refills: 1    vitamin C (ASCORBIC ACID) 500 MG tablet Take 500 mg by mouth daily.    albuterol (PROVENTIL HFA;VENTOLIN HFA) 108 (90 BASE) MCG/ACT inhaler Inhale 2 puffs into the lungs every 6 (six) hours as needed for wheezing. Qty: 1 Inhaler, Refills: 0      STOP taking these medications     losartan-hydrochlorothiazide (HYZAAR) 100-25 MG per tablet         Follow-up Information    Follow up with Nyoka Cowden, MD. Go on 04/15/2015.   Specialty:  Internal Medicine   Why:  at 1:15pm     For Post Hospitalization Follow up and BP check with Dr. Dolores Hoose information:  [place] [id number] [phone]       TOTAL DISCHARGE TIME: 35 mins  [facility] Pager [phone]  04/12/2015, 12:46 PM

## 2015-04-12 NOTE — Progress Notes (Signed)
NCM spoke to pt and she will need nebulizer machine for home. Contacted [facility] for neb machine for home. Jonnie Finner RN CCM Case Mgmt phone [phone]

## 2015-04-12 NOTE — Discharge Instructions (Signed)

## 2015-04-15 ENCOUNTER — Encounter: Payer: Self-pay | Admitting: Family Medicine

## 2015-04-15 ENCOUNTER — Ambulatory Visit (INDEPENDENT_AMBULATORY_CARE_PROVIDER_SITE_OTHER): Payer: Medicare Other | Admitting: Family Medicine

## 2015-04-15 ENCOUNTER — Telehealth: Payer: Self-pay | Admitting: Internal Medicine

## 2015-04-15 VITALS — BP 138/80 | HR 110 | Temp 97.7°F | Wt 239.0 lb

## 2015-04-15 DIAGNOSIS — N184 Chronic kidney disease, stage 4 (severe): Secondary | ICD-10-CM | POA: Diagnosis not present

## 2015-04-15 DIAGNOSIS — N179 Acute kidney failure, unspecified: Secondary | ICD-10-CM

## 2015-04-15 DIAGNOSIS — J4551 Severe persistent asthma with (acute) exacerbation: Secondary | ICD-10-CM

## 2015-04-15 DIAGNOSIS — I10 Essential (primary) hypertension: Secondary | ICD-10-CM | POA: Diagnosis not present

## 2015-04-15 MED ORDER — AMLODIPINE BESYLATE 5 MG PO TABS: 5.0000 mg | ORAL_TABLET | Freq: Every day | ORAL | Status: DC

## 2015-04-15 NOTE — Patient Instructions (Signed)
Continue with the Amlodipine. STOP the Losartan HCTZ We will call you with the pulmonary referral. Finish out the prednisone and continue with your usual inhalers for now.

## 2015-04-15 NOTE — Progress Notes (Signed)
Pre visit review using our clinic review tool, if applicable. No additional management support is needed unless otherwise documented below in the visit note. 

## 2015-04-15 NOTE — Telephone Encounter (Signed)
Spoke with Hilda Blades at Dr [employer] office, they have already scheduled to patient with [facility] [place].  Apologized for delay in call back.  States that nothing further needed from our office.

## 2015-04-15 NOTE — Progress Notes (Signed)
   Subjective:    Patient ID: [name], female    DOB: 04-12-1935, 80 y.o.   MRN: [id number]  HPI Patient seen for hospital follow-up in absence of her primary physician. She was recently admitted on April 21 through the 24th for asthma exacerbation. Her chronic problems include history of obesity, hypothyroidism, hyperlipidemia, hypertension, GERD, osteoarthritis, chronic kidney disease stage IV. She has history of what sounds like severe persistent asthma. She at baseline takes Qvar 80 g 2 puffs twice daily-and compliant with therapy.  Recent admission for wheezing and dizziness. Felt to be triggered by viral bronchitis. At baseline she takes Qvar as above as well as Singulair. Patient had elevated d-dimer. VQ scan negative for PE. Patient was treated with IV steroids along with nebulizers and promptly improved. She states she is feeling much better today on prednisone than she had prior to admission. Recommendation was referral to pulmonary for consideration of pulmonary function testing and further management.  Hypertension and chronic kidney disease. Her creatinine jumped from her normal baseline around 1.8 to 2.3. After stopping losartan HCTZ discharge creatinine 1.9. She was placed on amlodipine 5 mg daily and request refills.  Past Medical History  Diagnosis Date  . ASTHMA 06/18/2007  . COLONIC POLYPS, HX OF 11/06/2009  . DYSPNEA ON EXERTION 05/06/2008  . GERD 06/18/2007  . HYPERCHOLESTEROLEMIA, PURE 07/09/2007  . HYPERTENSION 06/18/2007  . HYPOTHYROIDISM 06/18/2007  . NEPHROLITHIASIS, HX OF 08/07/2008  . OSTEOARTHRITIS 05/07/2008  . RENAL INSUFFICIENCY 05/06/2008  . Goiter   . Obesity   . Insomnia   . Renal cyst    Past Surgical History  Procedure Laterality Date  . Abdominal hysterectomy    . Tonsillectomy    . Cataract extraction    . Knee arthroscopy    . Carpal tunnel release    . Total knee arthroplasty    . Lithotripsy      reports that she quit smoking about 36 years  ago. She has never used smokeless tobacco. She reports that she drinks alcohol. She reports that she does not use illicit drugs. family history includes Colon cancer in her other; Heart Problems in her father; Multiple myeloma in her other. There is no history of Hypertension or Cancer. No Known Allergies    Review of Systems  Constitutional: Negative for chills and unexpected weight change.  Respiratory: Positive for cough and wheezing.   Cardiovascular: Negative for chest pain.  Gastrointestinal: Negative for abdominal pain.  Genitourinary: Negative for dysuria.  Neurological: Negative for dizziness.       Objective:   Physical Exam  Constitutional: She appears well-developed and well-nourished. No distress.  HENT:  Mouth/Throat: Oropharynx is clear and moist.  Neck: Neck supple. No thyromegaly present.  Cardiovascular: Normal rate and regular rhythm.   Pulmonary/Chest: Effort normal. She has wheezes. She has no rales.  Musculoskeletal: She exhibits no edema.  Neurological: She is alert.          Assessment & Plan:  #1 asthma moderate to severe persistent with recent exacerbation and hospitalization above. She still has some wheezing but clinically much improved. Pulse oximetry 96% room air. Continue prednisone taper. Set up pulmonary referral #2 chronic kidney disease stage IV. Recent discontinuation of losartan. She has ongoing follow-up with nephrology #3 hypertension. Stable. Patient misunderstood and had started back her losartan. Refill amlodipine and hold losartan for now.

## 2015-04-30 ENCOUNTER — Telehealth: Payer: Self-pay | Admitting: *Deleted

## 2015-04-30 NOTE — Telephone Encounter (Signed)
Pt stated that she has a productive cough, fever of 101 yesterday, right earache, and she can't walk 10 steps without getting SOB.  She just came off of a prednisone tapering pack and symptoms started when she was finished.  She denies chest pain and is not in acute distress.  She wanted to know if Dr Raliegh Ip could give her more prednisone.  I scheduled an appt with Tommi Rumps tomorrow at 1:15 but informed pt that I would message to Dr Raliegh Ip to see if he wanted to keep appt with [facility] or give her some prednisone.

## 2015-04-30 NOTE — Telephone Encounter (Signed)
Left message for pt to call back  °

## 2015-04-30 NOTE — Telephone Encounter (Signed)
Patient has temperature of 101.  Please try to arrange office visit today

## 2015-04-30 NOTE — Telephone Encounter (Signed)
Spoke to pt's husband Coralyn Mark, told him to tell pt to keep appointment tomorrow with Tommi Rumps per Dr. Raliegh Ip. Coralyn Mark verbalized understanding and said he will tell pt.

## 2015-05-01 ENCOUNTER — Encounter: Payer: Self-pay | Admitting: Adult Health

## 2015-05-01 ENCOUNTER — Ambulatory Visit (INDEPENDENT_AMBULATORY_CARE_PROVIDER_SITE_OTHER)
Admission: RE | Admit: 2015-05-01 | Discharge: 2015-05-01 | Disposition: A | Discharge status: Home / Self Care | Payer: Medicare Other | Source: Ambulatory Visit | Attending: Adult Health | Admitting: Adult Health

## 2015-05-01 ENCOUNTER — Ambulatory Visit (INDEPENDENT_AMBULATORY_CARE_PROVIDER_SITE_OTHER): Payer: Medicare Other | Admitting: Adult Health

## 2015-05-01 ENCOUNTER — Other Ambulatory Visit: Payer: Self-pay | Admitting: Adult Health

## 2015-05-01 VITALS — BP 100/68 | HR 117 | Temp 99.4°F | Ht 65.0 in | Wt 243.6 lb

## 2015-05-01 DIAGNOSIS — H9201 Otalgia, right ear: Secondary | ICD-10-CM

## 2015-05-01 DIAGNOSIS — R0602 Shortness of breath: Secondary | ICD-10-CM

## 2015-05-01 DIAGNOSIS — R05 Cough: Secondary | ICD-10-CM

## 2015-05-01 DIAGNOSIS — R059 Cough, unspecified: Secondary | ICD-10-CM

## 2015-05-01 MED ORDER — PREDNISONE 20 MG PO TABS
20.0000 mg | ORAL_TABLET | Freq: Every day | ORAL | Status: DC
Start: 2015-05-01 — End: 2015-06-30

## 2015-05-01 MED ORDER — BENZONATATE 200 MG PO CAPS: 200.0000 mg | ORAL_CAPSULE | Freq: Three times a day (TID) | ORAL | Status: DC | PRN

## 2015-05-01 MED ORDER — IPRATROPIUM-ALBUTEROL 0.5-2.5 (3) MG/3ML IN SOLN
3.0000 mL | Freq: Once | RESPIRATORY_TRACT | Status: AC
  Administered 2015-05-01: 3 mL via RESPIRATORY_TRACT

## 2015-05-01 MED ORDER — ALBUTEROL SULFATE (2.5 MG/3ML) 0.083% IN NEBU
2.5000 mg | INHALATION_SOLUTION | Freq: Once | RESPIRATORY_TRACT | Status: AC
  Administered 2015-05-01: 2.5 mg via RESPIRATORY_TRACT

## 2015-05-01 MED ORDER — AZITHROMYCIN 250 MG PO TABS: ORAL_TABLET | ORAL | Status: DC

## 2015-05-01 MED ORDER — BENZONATATE 200 MG PO CAPS
200.0000 mg | ORAL_CAPSULE | Freq: Three times a day (TID) | ORAL | Status: DC | PRN
Start: 2015-05-01 — End: 2015-05-01

## 2015-05-01 MED ORDER — CIPROFLOXACIN-DEXAMETHASONE 0.3-0.1 % OT SUSP: OTIC | Status: DC

## 2015-05-01 NOTE — Patient Instructions (Addendum)
Follow up at the lab and then go to the [place] office for a chest x -ray. I will follow up with you regarding the results.   I have sent a prescription to the pharmacy for Prednisone. Take it for 7 days. 40mg ,40mg ,20,mg,20mg ,20mg ,20mg ,10mg ,10mg .   You also have a prescription at the pharmacy for tessalon pearls, you can take these up to three times a day as needed for your cough.   Additionally, I sent in a prescription for Ciprodex, which is an antibiotic for your ear infection. Please use as directed.   Please follow up if no improvement in 2-3 days or sooner if needed. Go to the ER if you are unable to breath, have CP, or have difficulty swallowing.   Shortness of Breath Shortness of breath means you have trouble breathing. It could also mean that you have a medical problem. You should get immediate medical care for shortness of breath. CAUSES   Not enough oxygen in the air such as with high altitudes or a smoke-filled room.  Certain lung diseases, infections, or problems.  Heart disease or conditions, such as angina or heart failure.  Low red blood cells (anemia).  Poor physical fitness, which can cause shortness of breath when you exercise.  Chest or back injuries or stiffness.  Being overweight.  Smoking.  Anxiety, which can make you feel like you are not getting enough air. DIAGNOSIS  Serious medical problems can often be found during your physical exam. Tests may also be done to determine why you are having shortness of breath. Tests may include:  Chest X-rays.  Lung function tests.  Blood tests.  An electrocardiogram (ECG).  An ambulatory electrocardiogram. An ambulatory ECG records your heartbeat patterns over a 24-hour period.  Exercise testing.  A transthoracic echocardiogram (TTE). During echocardiography, sound waves are used to evaluate how blood flows through your heart.  A transesophageal echocardiogram (TEE).  Imaging scans. Your health care provider  may not be able to find a cause for your shortness of breath after your exam. In this case, it is important to have a follow-up exam with your health care provider as directed.  TREATMENT  Treatment for shortness of breath depends on the cause of your symptoms and can vary greatly. HOME CARE INSTRUCTIONS   Do not smoke. Smoking is a common cause of shortness of breath. If you smoke, ask for help to quit.  Avoid being around chemicals or things that may bother your breathing, such as paint fumes and dust.  Rest as needed. Slowly resume your usual activities.  If medicines were prescribed, take them as directed for the full length of time directed. This includes oxygen and any inhaled medicines.  Keep all follow-up appointments as directed by your health care provider. SEEK MEDICAL CARE IF:   Your condition does not improve in the time expected.  You have a hard time doing your normal activities even with rest.  You have any new symptoms. SEEK IMMEDIATE MEDICAL CARE IF:   Your shortness of breath gets worse.  You feel light-headed, faint, or develop a cough not controlled with medicines.  You start coughing up blood.  You have pain with breathing.  You have chest pain or pain in your arms, shoulders, or abdomen.  You have a fever.  You are unable to walk up stairs or exercise the way you normally do. MAKE SURE YOU:  Understand these instructions.  Will watch your condition.  Will get help right away if you are not  doing well or get worse. Document Released: 08/30/2001 Document Revised: 12/10/2013 Document Reviewed: 02/20/2012 [facility] Patient Information 2015 [place], [place]. This information is not intended to replace advice given to you by your health care provider. Make sure you discuss any questions you have with your health care provider.

## 2015-05-01 NOTE — Addendum Note (Signed)
Addended by: Apolinar Junes on: 05/01/2015 05:19 PM   Modules accepted: Level of Service

## 2015-05-01 NOTE — Progress Notes (Addendum)
Subjective:    Patient ID: [name], female    DOB: 1935/07/12, 80 y.o.   MRN: [id number]  HPI Patient presents to the office today for SOB and non productive cough with fever up to 101. She first noticed that she had a fever two days. She has had a cough since before going into the hospital. She endorses an occasional productive cough.  Now she cannot walk 10 feet without becoming short of breath. Does not wear oxygen at home. Usually up and active.   Was seen in the ER on 04/09/2015 for asthma exacerbation. She felt better upon upon d/c. [place] went [place], [place] complaint in the mountains was shortness of breath upon walking up stairs.   Also has swelling in ankles.   Also has headache/right sided ear ache.    Review of Systems  Constitutional: Positive for fever, activity change and fatigue. Negative for appetite change and unexpected weight change.  HENT: Positive for ear pain (Right). Negative for hearing loss.   Eyes: Positive for pain (right).  Respiratory: Positive for cough and shortness of breath. Negative for chest tightness and wheezing.   Cardiovascular: Positive for leg swelling. Negative for chest pain and palpitations.  Neurological: Negative for dizziness, weakness and light-headedness.       Objective:   Physical Exam  Constitutional: She is oriented to person, place, and time. She appears well-developed and well-nourished. No distress.  HENT:  Head: Normocephalic and atraumatic.  Left Ear: External ear normal.  Mouth/Throat: No oropharyngeal exudate.  Right ear canal red and inflammed  Neck: Normal range of motion. Neck supple.  Cardiovascular: Normal heart sounds and intact distal pulses.  Exam reveals no gallop and no friction rub.   No murmur heard. Sinus Tachycardia  Pulmonary/Chest: Effort normal. No respiratory distress. She has wheezes (left upper ). She has no rales. She exhibits no tenderness.  Musculoskeletal: Normal  range of motion. She exhibits edema (+1 pitting edema of bilateral ankles. ).  Lymphadenopathy:    She has no cervical adenopathy.  Neurological: She is alert and oriented to person, place, and time.  Skin: Skin is warm and dry. She is not diaphoretic.  Psychiatric: She has a normal mood and affect. Her behavior is normal. Thought content normal.  Nursing note and vitals reviewed.      Assessment & Plan:  1. SOB (shortness of breath)  - DG Chest 2 View; Future - CBC with Differential/Platelet - predniSONE (DELTASONE) 20 MG tablet; Take 1 tablet (20 mg total) by mouth daily with breakfast.  Dispense: 8 tablet; Refill: 0 - benzonatate (TESSALON) 200 MG capsule; Take 1 capsule (200 mg total) by mouth 3 (three) times daily as needed for cough.  Dispense: 20 capsule; Refill: 0 - ipratropium-albuterol (DUONEB) 0.5-2.5 (3) MG/3ML nebulizer solution 3 mL; Take 3 mLs by nebulization once. - albuterol (PROVENTIL) (2.5 MG/3ML) 0.083% nebulizer solution 2.5 mg; Take 3 mLs (2.5 mg total) by nebulization once. - Will follow up with patient once chest x-ray is back. To r/o PNA - Follow up on Monday if no improvement - Go to the ER with worsening symptoms.  - Use Nebulizer at home as directed - Chest xray not specific for PNA. She has signs and symptoms. I will treat with Z-pak  2. Cough  - ipratropium-albuterol (DUONEB) 0.5-2.5 (3) MG/3ML nebulizer solution 3 mL; Take 3 mLs by nebulization once. - albuterol (PROVENTIL) (2.5 MG/3ML) 0.083% nebulizer solution 2.5 mg; Take 3  mLs (2.5 mg total) by nebulization once. - Follow up if no improvement in 2-3 days.   3. Ear ache, right Ciprodex prescribed. 4 drops in right ear daily for 7 days.  - Follow up if no improvement in 2-3 days.

## 2015-05-01 NOTE — Progress Notes (Signed)
Pre visit review using our clinic review tool, if applicable. No additional management support is needed unless otherwise documented below in the visit note. 

## 2015-05-11 ENCOUNTER — Other Ambulatory Visit: Payer: Self-pay | Admitting: Adult Health

## 2015-05-11 NOTE — Telephone Encounter (Signed)
Ok to refill 

## 2015-05-14 ENCOUNTER — Telehealth: Payer: Self-pay | Admitting: Internal Medicine

## 2015-05-14 ENCOUNTER — Emergency Department (HOSPITAL_COMMUNITY): Payer: Medicare Other

## 2015-05-14 ENCOUNTER — Encounter (HOSPITAL_COMMUNITY): Payer: Self-pay | Admitting: Emergency Medicine

## 2015-05-14 ENCOUNTER — Emergency Department (HOSPITAL_COMMUNITY)
Admission: EM | Admit: 2015-05-14 | Discharge: 2015-05-14 | Disposition: A | Discharge status: Home / Self Care | Payer: Medicare Other | Attending: Emergency Medicine | Admitting: Emergency Medicine

## 2015-05-14 DIAGNOSIS — Z87448 Personal history of other diseases of urinary system: Secondary | ICD-10-CM | POA: Diagnosis not present

## 2015-05-14 DIAGNOSIS — S43034A Inferior dislocation of right humerus, initial encounter: Secondary | ICD-10-CM | POA: Diagnosis not present

## 2015-05-14 DIAGNOSIS — Z79899 Other long term (current) drug therapy: Secondary | ICD-10-CM | POA: Insufficient documentation

## 2015-05-14 DIAGNOSIS — E039 Hypothyroidism, unspecified: Secondary | ICD-10-CM | POA: Diagnosis not present

## 2015-05-14 DIAGNOSIS — W108XXA Fall (on) (from) other stairs and steps, initial encounter: Secondary | ICD-10-CM | POA: Diagnosis not present

## 2015-05-14 DIAGNOSIS — Z8739 Personal history of other diseases of the musculoskeletal system and connective tissue: Secondary | ICD-10-CM | POA: Diagnosis not present

## 2015-05-14 DIAGNOSIS — Y998 Other external cause status: Secondary | ICD-10-CM | POA: Diagnosis not present

## 2015-05-14 DIAGNOSIS — Z8669 Personal history of other diseases of the nervous system and sense organs: Secondary | ICD-10-CM | POA: Diagnosis not present

## 2015-05-14 DIAGNOSIS — Y9389 Activity, other specified: Secondary | ICD-10-CM | POA: Diagnosis not present

## 2015-05-14 DIAGNOSIS — J45909 Unspecified asthma, uncomplicated: Secondary | ICD-10-CM | POA: Diagnosis not present

## 2015-05-14 DIAGNOSIS — I10 Essential (primary) hypertension: Secondary | ICD-10-CM | POA: Insufficient documentation

## 2015-05-14 DIAGNOSIS — S43014A Anterior dislocation of right humerus, initial encounter: Secondary | ICD-10-CM | POA: Diagnosis not present

## 2015-05-14 DIAGNOSIS — Z86718 Personal history of other venous thrombosis and embolism: Secondary | ICD-10-CM | POA: Insufficient documentation

## 2015-05-14 DIAGNOSIS — Y9289 Other specified places as the place of occurrence of the external cause: Secondary | ICD-10-CM | POA: Insufficient documentation

## 2015-05-14 DIAGNOSIS — E780 Pure hypercholesterolemia: Secondary | ICD-10-CM | POA: Diagnosis not present

## 2015-05-14 DIAGNOSIS — Q6100 Congenital renal cyst, unspecified: Secondary | ICD-10-CM | POA: Insufficient documentation

## 2015-05-14 DIAGNOSIS — Z86010 Personal history of colonic polyps: Secondary | ICD-10-CM | POA: Insufficient documentation

## 2015-05-14 DIAGNOSIS — K219 Gastro-esophageal reflux disease without esophagitis: Secondary | ICD-10-CM | POA: Insufficient documentation

## 2015-05-14 DIAGNOSIS — S43004A Unspecified dislocation of right shoulder joint, initial encounter: Secondary | ICD-10-CM

## 2015-05-14 DIAGNOSIS — E669 Obesity, unspecified: Secondary | ICD-10-CM | POA: Diagnosis not present

## 2015-05-14 DIAGNOSIS — Z87442 Personal history of urinary calculi: Secondary | ICD-10-CM | POA: Insufficient documentation

## 2015-05-14 DIAGNOSIS — S4991XA Unspecified injury of right shoulder and upper arm, initial encounter: Secondary | ICD-10-CM | POA: Diagnosis present

## 2015-05-14 MED ORDER — ETOMIDATE 2 MG/ML IV SOLN
0.2000 mg/kg | Freq: Once | INTRAVENOUS | Status: AC
  Administered 2015-05-14: 22.1 mg via INTRAVENOUS
  Filled 2015-05-14: qty 20

## 2015-05-14 MED ORDER — HYDROMORPHONE HCL 1 MG/ML IJ SOLN
0.5000 mg | Freq: Once | INTRAMUSCULAR | Status: AC
  Administered 2015-05-14: 0.5 mg via INTRAVENOUS
  Filled 2015-05-14: qty 1

## 2015-05-14 MED ORDER — MORPHINE SULFATE 4 MG/ML IJ SOLN
4.0000 mg | Freq: Once | INTRAMUSCULAR | Status: AC
  Administered 2015-05-14: 4 mg via INTRAVENOUS
  Filled 2015-05-14: qty 1

## 2015-05-14 MED ORDER — ETOMIDATE 2 MG/ML IV SOLN
INTRAVENOUS | Status: AC | PRN
  Administered 2015-05-14: 16 mg via INTRAVENOUS

## 2015-05-14 NOTE — ED Notes (Addendum)
Pt tripped and fell down 2 stairs today at 0945, c/o right shoulder pain. Right shoulder is clearly deformed, lower than left shoulder. Pt is a DNR but states that if intubation were necessary due to sedation she would want intubation to save her life.

## 2015-05-14 NOTE — ED Notes (Signed)
Provided brief support with pt and spouse.

## 2015-05-14 NOTE — Discharge Instructions (Signed)
Shoulder Dislocation  Your shoulder is made up of three bones: the collar bone (clavicle); the shoulder blade (scapula), which includes the socket (glenoid cavity); and the upper arm bone (humerus). Your shoulder joint is the place where these bones meet. Strong, fibrous tissues hold these bones together (ligaments). Muscles and strong, fibrous tissues that connect the muscles to these bones (tendons) allow your arm to move through this joint. The range of motion of your shoulder joint is more extensive than most of your other joints, and the glenoid cavity is very shallow. That is the reason that your shoulder joint is one of the most unstable joints in your body. It is far more prone to dislocation than your other joints. Shoulder dislocation is when your humerus is forced out of your shoulder joint.  CAUSES  Shoulder dislocation is caused by a forceful impact on your shoulder. This impact usually is from an injury, such as a sports injury or a fall.  SYMPTOMS  Symptoms of shoulder dislocation include:  · Deformity of your shoulder.  · Intense pain.  · Inability to move your shoulder joint.  · Numbness, weakness, or tingling around your shoulder joint (your neck or down your arm).  · Bruising or swelling around your shoulder.  DIAGNOSIS  In order to diagnose a dislocated shoulder, your caregiver will perform a physical exam. Your caregiver also may have an X-ray exam done to see if you have any broken bones. Magnetic resonance imaging (MRI) is a procedure that sometimes is done to help your caregiver see any damage to the soft tissues around your shoulder, particularly your rotator cuff tendons. Additionally, your caregiver also may have electromyography done to measure the electrical discharges produced in your muscles if you have signs or symptoms of nerve damage.  TREATMENT  A shoulder dislocation is treated by placing the humerus back in the joint (reduction). Your caregiver does this either manually (closed  reduction), by moving your humerus back into the joint through manipulation, or through surgery (open reduction). When your humerus is back in place, severe pain should improve almost immediately.  You also may need to have surgery if you have a weak shoulder joint or ligaments, and you have recurring shoulder dislocations, despite rehabilitation. In rare cases, surgery is necessary if your nerves or blood vessels are damaged during the dislocation.  After your reduction, your arm will be placed in a shoulder immobilizer or sling to keep it from moving. Your caregiver will have you wear your shoulder immobilizer or sling for 3 days to 3 weeks, depending on how serious your dislocation is. When your shoulder immobilizer or sling is removed, your caregiver may prescribe physical therapy to help improve the range of motion in your shoulder joint.  HOME CARE INSTRUCTIONS   The following measures can help to reduce pain and speed up the healing process:  · Rest your injured joint. Do not move it. Avoid activities similar to the one that caused your injury.  · Apply ice to your injured joint for the first day or two after your reduction or as directed by your caregiver. Applying ice helps to reduce inflammation and pain.  ¨ Put ice in a plastic bag.  ¨ Place a towel between your skin and the bag.  ¨ Leave the ice on for 15-20 minutes at a time, every 2 hours while you are awake.  · Exercise your hand by squeezing a soft ball. This helps to eliminate stiffness and swelling in your hand and   wrist.  · Take over-the-counter or prescription medicine for pain or discomfort as told by your caregiver.  SEEK IMMEDIATE MEDICAL CARE IF:   · Your shoulder immobilizer or sling becomes damaged.  · Your pain becomes worse rather than better.  · You lose feeling in your arm or hand, or they become white and cold.  MAKE SURE YOU:   · Understand these instructions.  · Will watch your condition.  · Will get help right away if you are not  doing well or get worse.  Document Released: 08/30/2001 Document Revised: 04/21/2014 Document Reviewed: 09/25/2011  [employer]® Patient Information ©2015 [employer]. This information is not intended to replace advice given to you by your health care provider. Make sure you discuss any questions you have with your health care provider.

## 2015-05-14 NOTE — Telephone Encounter (Signed)
Pt is currently admitted

## 2015-05-14 NOTE — Telephone Encounter (Signed)
Patient Name: [name] DOB: [birth year] Initial Comment Caller states wife fell down steps and hurt her shoulder, can't move her arm Nurse Assessment Guidelines Guideline Title Affirmed Question Affirmed Notes Final Disposition User Clinical Call [place], RN, Lynda Comments Caller not reached after 3 attempts.

## 2015-05-14 NOTE — ED Provider Notes (Signed)
CSN: [id number]     Arrival date & time 05/14/15  1053 History   First MD Initiated Contact with Patient 05/14/15 1103     Chief Complaint  Patient presents with  . Shoulder Dislocation       HPI Patient states she fell forward down a set of stairs on accident this morning and injured her right shoulder.  She reports pain with range of motion of her right shoulder.  Obvious deformity noted on right shoulder.  Denies head injury.  No neck pain.  Denies chest pain or abdominal pain.  Pain is moderate to severe in severity.   Past Medical History  Diagnosis Date  . ASTHMA 06/18/2007  . COLONIC POLYPS, HX OF 11/06/2009  . DYSPNEA ON EXERTION 05/06/2008  . GERD 06/18/2007  . HYPERCHOLESTEROLEMIA, PURE 07/09/2007  . HYPERTENSION 06/18/2007  . HYPOTHYROIDISM 06/18/2007  . NEPHROLITHIASIS, HX OF 08/07/2008  . OSTEOARTHRITIS 05/07/2008  . RENAL INSUFFICIENCY 05/06/2008  . Goiter   . Obesity   . Insomnia   . Renal cyst    Past Surgical History  Procedure Laterality Date  . Abdominal hysterectomy    . Tonsillectomy    . Cataract extraction    . Knee arthroscopy    . Carpal tunnel release    . Total knee arthroplasty    . Lithotripsy     Family History  Problem Relation Age of Onset  . Hypertension Neg Hx     family  . Cancer Neg Hx     colon ca , prostate ca  . Heart Problems Father   . Multiple myeloma Other   . Colon cancer Other    History  Substance Use Topics  . Smoking status: Former Smoker    Quit date: 12/19/1978  . Smokeless tobacco: Never Used  . Alcohol Use: Yes     Comment: "1 glass wine once or twice per month" - 04/09/15   OB History    No data available     Review of Systems  All other systems reviewed and are negative.     Allergies  Review of patient's allergies indicates no known allergies.  Home Medications   Prior to Admission medications   Medication Sig Start Date End Date Taking? Authorizing Provider  albuterol (PROVENTIL HFA;VENTOLIN HFA)  108 (90 BASE) MCG/ACT inhaler Inhale 2 puffs into the lungs every 6 (six) hours as needed for wheezing. 01/08/13  Yes Marletta Lor, MD  albuterol (PROVENTIL) (2.5 MG/3ML) 0.083% nebulizer solution Take 3 mLs (2.5 mg total) by nebulization every 4 (four) hours as needed for wheezing. 04/12/15  Yes Bonnielee Haff, MD  amLODipine (NORVASC) 5 MG tablet Take 1 tablet (5 mg total) by mouth daily. 04/15/15  Yes Eulas Post, MD  benzonatate (TESSALON) 200 MG capsule TAKE 1 CAPSULE BY MOUTH THREE TIMES DAILY AS NEEDED FOR COUGH 05/11/15  Yes Dorothyann Peng, NP  cetirizine (ZYRTEC) 10 MG tablet Take 10 mg by mouth daily.   Yes Historical Provider, MD  DULERA 200-5 MCG/ACT AERO Inhale 2 puffs into the lungs 2 (two) times daily. 05/07/15  Yes Historical Provider, MD  DULoxetine (CYMBALTA) 30 MG capsule TAKE 1 BY MOUTH DAILY 12/23/14  Yes Marletta Lor, MD  esomeprazole (NEXIUM) 40 MG capsule Take 1 capsule (40 mg total) by mouth daily before breakfast. 09/17/14  Yes Marletta Lor, MD  levothyroxine (SYNTHROID, LEVOTHROID) 100 MCG tablet Take 1 tablet (100 mcg total) by mouth daily. 01/06/15  Yes Marletta Lor, MD  montelukast (SINGULAIR) 10 MG tablet TAKE 1 BY MOUTH DAILY 03/30/15  Yes Marletta Lor, MD  Multiple Vitamin (MULTIVITAMIN) tablet Take 1 tablet by mouth daily.   Yes Historical Provider, MD  simvastatin (ZOCOR) 20 MG tablet Take 1 tablet (20 mg total) by mouth at bedtime. 09/17/14  Yes Marletta Lor, MD  solifenacin (VESICARE) 10 MG tablet Take 1 tablet (10 mg total) by mouth daily. 02/25/15  Yes Marletta Lor, MD  traMADol (ULTRAM) 50 MG tablet Take 1 tablet (50 mg total) by mouth every 6 (six) hours as needed. Patient taking differently: Take 50 mg by mouth every 6 (six) hours as needed for moderate pain.  01/12/15  Yes Marletta Lor, MD  vitamin C (ASCORBIC ACID) 500 MG tablet Take 500 mg by mouth daily.   Yes Historical Provider, MD  azithromycin (ZITHROMAX)  250 MG tablet Take 2 tablets on Day 1.  Then take 1 tablet daily. Patient not taking: Reported on 05/14/2015 05/01/15   Dorothyann Peng, NP  beclomethasone (QVAR) 80 MCG/ACT inhaler Inhale 2 puffs into the lungs 2 (two) times daily. Patient not taking: Reported on 05/14/2015 04/06/15   Marletta Lor, MD  ciprofloxacin-dexamethasone [facility]) otic suspension Apply 4 drops to the affected ear(s) twice daily x 7 days Patient not taking: Reported on 05/14/2015 05/01/15   Dorothyann Peng, NP  predniSONE (DELTASONE) 20 MG tablet Take 1 tablet (20 mg total) by mouth daily with breakfast. Patient not taking: Reported on 05/14/2015 05/01/15   Dorothyann Peng, NP   BP 146/82 mmHg  Pulse 85  Temp(Src) 97.9 F (36.6 C) (Oral)  Resp 21  SpO2 100% Physical Exam  Constitutional: She is oriented to person, place, and time. She appears well-developed and well-nourished. No distress.  HENT:  Head: Normocephalic and atraumatic.  Eyes: EOM are normal.  Neck: Normal range of motion. Neck supple.  C-spine nontender.  C-spine cleared by Nexus criteria.  Cardiovascular: Normal rate, regular rhythm and normal heart sounds.   Pulmonary/Chest: Effort normal and breath sounds normal.  Abdominal: Soft. She exhibits no distension. There is no tenderness.  Musculoskeletal: Normal range of motion.  Obvious deformity of right shoulder consistent with anterior shoulder dislocation.  Normal grip strength in right hand.  Normal right radial pulse.    Neurological: She is alert and oriented to person, place, and time.  Skin: Skin is warm and dry.  Psychiatric: She has a normal mood and affect. Judgment normal.  Nursing note and vitals reviewed.   ED Course  Procedures (including critical care time)  Procedural sedation Performed by: Hoy Morn Consent: Verbal consent obtained. Risks and benefits: risks, benefits and alternatives were discussed Required items: required blood products, implants, devices, and special  equipment available Patient identity confirmed: arm band and provided demographic data Time out: Immediately prior to procedure a "time out" was called to verify the correct patient, procedure, equipment, support staff and site/side marked as required. Sedation type: moderate (conscious) sedation NPO time confirmed and considedered Sedatives: ETOMIDATE Physician Time at Bedside: 16 Vitals: Vital signs were monitored during sedation. Cardiac Monitor, pulse oximeter Patient tolerance: Patient tolerated the procedure well with no immediate complications. Comments: Pt with uneventful recovered. Returned to pre-procedural sedation baseline  Reduction of dislocation Performed by: Hoy Morn Consent: Verbal consent obtained. Risks and benefits: risks, benefits and alternatives were discussed Consent given by: patient Required items: required blood products, implants, devices, and special equipment available Time out: Immediately prior to procedure a "time out" was called  to verify the correct patient, procedure, equipment, support staff and site/side marked as required. Patient sedated: yes Vitals: Vital signs were monitored during sedation. Patient tolerance: Patient tolerated the procedure well with no immediate complications. Joint: right shoulder Reduction technique: manipulation   Labs Review Labs Reviewed - No data to display  Imaging Review Dg Shoulder Right  05/14/2015   CLINICAL DATA:  Fall with right shoulder pain and deformity today. Initial encounter.  EXAM: RIGHT SHOULDER - 2+ VIEW  COMPARISON:  05/01/2015 chest radiograph  FINDINGS: Anterior inferior subcoracoid dislocation of the humeral head is noted.  No definite fracture identified.  Mild right basilar atelectasis and small right pleural effusion again noted.  IMPRESSION: Anterior inferior dislocation of the humeral head. No discrete fracture.   Electronically Signed   By: Margarette Canada M.D.   On: 05/14/2015 12:05   Dg  Shoulder Right Port  05/14/2015   CLINICAL DATA:  Reduction of right shoulder.  EXAM: PORTABLE RIGHT SHOULDER - 2+ VIEW  COMPARISON:  Right shoulder radiographs from the same day.  FINDINGS: A single image demonstrates interval reduction of the right shoulder. No focal fractures are present. Degenerative changes are again noted at the glenohumeral and AC joints.  IMPRESSION: 1. Interval reduction of the right shoulder without evidence for fracture.   Electronically Signed   By: San Morelle M.D.   On: 05/14/2015 13:22  I personally reviewed the imaging tests through PACS system I reviewed available ER/hospitalization records through the EMR    EKG Interpretation None      MDM   Final diagnoses:  Dislocation of right shoulder joint, initial encounter   Tolerated procedure well.  Interval reduction of dislocation.  Orthopedic follow-up.  No other injury.  Pain control    Jola Schmidt, MD 05/14/15 [phone]

## 2015-06-04 ENCOUNTER — Other Ambulatory Visit: Payer: Self-pay | Admitting: Internal Medicine

## 2015-06-25 ENCOUNTER — Other Ambulatory Visit: Payer: Self-pay | Admitting: Internal Medicine

## 2015-06-30 ENCOUNTER — Ambulatory Visit (INDEPENDENT_AMBULATORY_CARE_PROVIDER_SITE_OTHER): Payer: Medicare Other | Admitting: Internal Medicine

## 2015-06-30 ENCOUNTER — Encounter: Payer: Self-pay | Admitting: Internal Medicine

## 2015-06-30 VITALS — BP 110/64 | HR 101 | Temp 98.4°F | Resp 20 | Ht 65.0 in | Wt 234.0 lb

## 2015-06-30 DIAGNOSIS — I10 Essential (primary) hypertension: Secondary | ICD-10-CM

## 2015-06-30 DIAGNOSIS — N184 Chronic kidney disease, stage 4 (severe): Secondary | ICD-10-CM | POA: Diagnosis not present

## 2015-06-30 DIAGNOSIS — E039 Hypothyroidism, unspecified: Secondary | ICD-10-CM | POA: Diagnosis not present

## 2015-06-30 DIAGNOSIS — N179 Acute kidney failure, unspecified: Secondary | ICD-10-CM | POA: Diagnosis not present

## 2015-06-30 NOTE — Patient Instructions (Addendum)
Limit your sodium (Salt) intake  Return in 6 months for follow-up  Decrease furosemide to once daily only

## 2015-06-30 NOTE — Progress Notes (Signed)
Pre visit review using our clinic review tool, if applicable. No additional management support is needed unless otherwise documented below in the visit note. 

## 2015-06-30 NOTE — Progress Notes (Signed)
Subjective:    Patient ID: [name], female    DOB: [birth year], 80 y.o.   MRN: [id number]  HPI  80 year old patient who is seen today in follow-up.  She has treated hypertension and is followed by nephrology due to chronic kidney disease.  She has been using furosemide twice a day due to significant lower extremity edema.  Of the past few days she has had some occasional dizziness and nausea.  The peripheral edema has resolved.  Otherwise feels well.  She has hypothyroidism and a history of asthma that has been stable.  Past Medical History  Diagnosis Date  . ASTHMA 06/18/2007  . COLONIC POLYPS, HX OF 11/06/2009  . DYSPNEA ON EXERTION 05/06/2008  . GERD 06/18/2007  . HYPERCHOLESTEROLEMIA, PURE 07/09/2007  . HYPERTENSION 06/18/2007  . HYPOTHYROIDISM 06/18/2007  . NEPHROLITHIASIS, HX OF 08/07/2008  . OSTEOARTHRITIS 05/07/2008  . RENAL INSUFFICIENCY 05/06/2008  . Goiter   . Obesity   . Insomnia   . Renal cyst     History   Social History  . Marital Status: Married    Spouse Name: N/A  . Number of Children: N/A  . Years of Education: N/A   Occupational History  . Not on file.   Social History Main Topics  . Smoking status: Former Smoker    Quit date: 12/19/1978  . Smokeless tobacco: Never Used  . Alcohol Use: Yes     Comment: "1 glass wine once or twice per month" - 04/09/15  . Drug Use: No  . Sexual Activity: Not on file   Other Topics Concern  . Not on file   Social History Narrative    Past Surgical History  Procedure Laterality Date  . Abdominal hysterectomy    . Tonsillectomy    . Cataract extraction    . Knee arthroscopy    . Carpal tunnel release    . Total knee arthroplasty    . Lithotripsy      Family History  Problem Relation Age of Onset  . Hypertension Neg Hx     family  . Cancer Neg Hx     colon ca , prostate ca  . Heart Problems Father   . Multiple myeloma Other   . Colon cancer Other     No Known Allergies  Current Outpatient  Prescriptions on File Prior to Visit  Medication Sig Dispense Refill  . albuterol (PROVENTIL HFA;VENTOLIN HFA) 108 (90 BASE) MCG/ACT inhaler Inhale 2 puffs into the lungs every 6 (six) hours as needed for wheezing. 1 Inhaler 0  . albuterol (PROVENTIL) (2.5 MG/3ML) 0.083% nebulizer solution Take 3 mLs (2.5 mg total) by nebulization every 4 (four) hours as needed for wheezing. 75 mL 12  . amLODipine (NORVASC) 5 MG tablet Take 1 tablet (5 mg total) by mouth daily. 90 tablet 3  . benzonatate (TESSALON) 200 MG capsule TAKE 1 CAPSULE BY MOUTH THREE TIMES DAILY AS NEEDED FOR COUGH 20 capsule 0  . cetirizine (ZYRTEC) 10 MG tablet Take 10 mg by mouth daily.    . DULERA 200-5 MCG/ACT AERO Inhale 2 puffs into the lungs 2 (two) times daily.  0  . DULoxetine (CYMBALTA) 30 MG capsule TAKE 1 BY MOUTH DAILY 90 capsule 1  . esomeprazole (NEXIUM) 40 MG capsule Take 1 capsule (40 mg total) by mouth daily before breakfast. 90 capsule 3  . levothyroxine (SYNTHROID, LEVOTHROID) 100 MCG tablet Take 1 tablet (100 mcg total) by mouth daily. 90 tablet 3  . montelukast (SINGULAIR)  10 MG tablet TAKE 1 BY MOUTH DAILY 90 tablet 1  . Multiple Vitamin (MULTIVITAMIN) tablet Take 1 tablet by mouth daily.    . simvastatin (ZOCOR) 20 MG tablet Take 1 tablet (20 mg total) by mouth at bedtime. 90 tablet 3  . traMADol (ULTRAM) 50 MG tablet Take 1 tablet (50 mg total) by mouth every 6 (six) hours as needed. (Patient taking differently: Take 50 mg by mouth every 6 (six) hours as needed for moderate pain. ) 90 tablet 1  . VESICARE 10 MG tablet TAKE 1 BY MOUTH DAILY 90 tablet 1  . vitamin C (ASCORBIC ACID) 500 MG tablet Take 500 mg by mouth daily.    . [DISCONTINUED] solifenacin (VESICARE) 10 MG tablet Take 1 tablet (10 mg total) by mouth daily. 90 tablet 3   No current facility-administered medications on file prior to visit.    BP 110/64 mmHg  Pulse 101  Temp(Src) 98.4 F (36.9 C) (Oral)  Resp 20  Ht 5' 5" (1.651 m)  Wt 234 lb  (106.142 kg)  BMI 38.94 kg/m2  SpO2 96%     Review of Systems  Constitutional: Negative.   HENT: Negative for congestion, dental problem, hearing loss, rhinorrhea, sinus pressure, sore throat and tinnitus.   Eyes: Negative for pain, discharge and visual disturbance.  Respiratory: Negative for cough and shortness of breath.   Cardiovascular: Positive for leg swelling. Negative for chest pain and palpitations.  Gastrointestinal: Positive for nausea. Negative for vomiting, abdominal pain, diarrhea, constipation, blood in stool and abdominal distention.  Genitourinary: Negative for dysuria, urgency, frequency, hematuria, flank pain, vaginal bleeding, vaginal discharge, difficulty urinating, vaginal pain and pelvic pain.  Musculoskeletal: Negative for joint swelling, arthralgias and gait problem.  Skin: Negative for rash.  Neurological: Positive for dizziness. Negative for syncope, speech difficulty, weakness, numbness and headaches.  Hematological: Negative for adenopathy.  Psychiatric/Behavioral: Negative for behavioral problems, dysphoric mood and agitation. The patient is not nervous/anxious.        Objective:   Physical Exam  Constitutional: She is oriented to person, place, and time. She appears well-developed and well-nourished.  Blood pressure 110/70  HENT:  Head: Normocephalic.  Right Ear: External ear normal.  Left Ear: External ear normal.  Mouth/Throat: Oropharynx is clear and moist.  Eyes: Conjunctivae and EOM are normal. Pupils are equal, round, and reactive to light.  Neck: Normal range of motion. Neck supple. No thyromegaly present.  Cardiovascular: Normal rate, regular rhythm, normal heart sounds and intact distal pulses.   Pulmonary/Chest: Effort normal and breath sounds normal.  Abdominal: Soft. Bowel sounds are normal. She exhibits no mass. There is no tenderness.  Musculoskeletal: Normal range of motion. She exhibits no edema.  Lymphadenopathy:    She has no  cervical adenopathy.  Neurological: She is alert and oriented to person, place, and time.  Skin: Skin is warm and dry. No rash noted.  Psychiatric: She has a normal mood and affect. Her behavior is normal.          Assessment & Plan:   Hypertension, well-controlled Peripheral edema resolved.  We'll decrease furosemide to once daily Asthma, stable Hypothyroidism Chronic kidney disease.  Follow-up nephrology  CPX 6 months

## 2015-07-22 ENCOUNTER — Other Ambulatory Visit: Payer: Self-pay | Admitting: Adult Health

## 2015-07-27 ENCOUNTER — Other Ambulatory Visit: Payer: Self-pay | Admitting: Internal Medicine

## 2015-07-28 LAB — HM MAMMOGRAPHY

## 2015-07-29 ENCOUNTER — Ambulatory Visit (INDEPENDENT_AMBULATORY_CARE_PROVIDER_SITE_OTHER): Payer: Medicare Other | Admitting: Internal Medicine

## 2015-07-29 ENCOUNTER — Ambulatory Visit (INDEPENDENT_AMBULATORY_CARE_PROVIDER_SITE_OTHER)
Admission: RE | Admit: 2015-07-29 | Discharge: 2015-07-29 | Disposition: A | Discharge status: Home / Self Care | Payer: Medicare Other | Source: Ambulatory Visit | Attending: Internal Medicine | Admitting: Internal Medicine

## 2015-07-29 ENCOUNTER — Encounter: Payer: Self-pay | Admitting: Internal Medicine

## 2015-07-29 ENCOUNTER — Other Ambulatory Visit (INDEPENDENT_AMBULATORY_CARE_PROVIDER_SITE_OTHER): Payer: Medicare Other

## 2015-07-29 VITALS — BP 120/66 | HR 101 | Ht 65.0 in | Wt 242.4 lb

## 2015-07-29 DIAGNOSIS — R0600 Dyspnea, unspecified: Secondary | ICD-10-CM

## 2015-07-29 DIAGNOSIS — R053 Chronic cough: Secondary | ICD-10-CM

## 2015-07-29 DIAGNOSIS — R05 Cough: Secondary | ICD-10-CM

## 2015-07-29 LAB — CBC WITH DIFFERENTIAL/PLATELET
BASOS ABS: 0 10*3/uL (ref 0.0–0.1)
Basophils Relative: 0.2 % (ref 0.0–3.0)
EOS PCT: 0.9 % (ref 0.0–5.0)
Eosinophils Absolute: 0.1 10*3/uL (ref 0.0–0.7)
HCT: 38.5 % (ref 36.0–46.0)
HEMOGLOBIN: 12.7 g/dL (ref 12.0–15.0)
LYMPHS PCT: 23.9 % (ref 12.0–46.0)
Lymphs Abs: 2 10*3/uL (ref 0.7–4.0)
MCHC: 33 g/dL (ref 30.0–36.0)
MCV: 82.7 fl (ref 78.0–100.0)
Monocytes Absolute: 0.6 10*3/uL (ref 0.1–1.0)
Monocytes Relative: 7.4 % (ref 3.0–12.0)
Neutro Abs: 5.6 10*3/uL (ref 1.4–7.7)
Neutrophils Relative %: 67.6 % (ref 43.0–77.0)
Platelets: 251 10*3/uL (ref 150.0–400.0)
RBC: 4.66 Mil/uL (ref 3.87–5.11)
RDW: 15.9 % — ABNORMAL HIGH (ref 11.5–15.5)
WBC: 8.3 10*3/uL (ref 4.0–10.5)

## 2015-07-29 MED ORDER — MOMETASONE FURO-FORMOTEROL FUM 100-5 MCG/ACT IN AERO: 2.0000 | INHALATION_SPRAY | Freq: Two times a day (BID) | RESPIRATORY_TRACT | Status: DC

## 2015-07-29 MED ORDER — MOMETASONE FURO-FORMOTEROL FUM 100-5 MCG/ACT IN AERO: 2.0000 | INHALATION_SPRAY | Freq: Every day | RESPIRATORY_TRACT | Status: DC

## 2015-07-29 NOTE — Patient Instructions (Addendum)
Nexium 40 mg   Take  30-60 min before first meal of the day and Pepcid (famotidine)  20 mg one @  bedtime until return to office - this is the best way to tell whether stomach acid is contributing to your problem.    Change dulera to 100 Take 2 puffs first thing in am and then another 2 puffs about 12 hours later.   Only use your albuterol as a rescue medication to be used if you can't catch your breath by resting or doing a relaxed purse lip breathing pattern.  - The less you use it, the better it will work when you need it. - Ok to use up to 2 puffs  every 4 hours if you must but call for immediate appointment if use goes up over your usual need - Don't leave home without it !!  (think of it like the spare tire for your car)   Only use your albuterol neb if your try the inhaler first and it fails to solve your breathing problem   Please remember to go to the lab and x-ray department downstairs for your tests - we will call you with the results when they are available.    Please schedule a follow up office visit in 4 weeks, sooner if needed

## 2015-07-29 NOTE — Progress Notes (Signed)
Subjective:    Patient ID: [name], female    DOB: 1935-05-11,    MRN: [id number]  HPI  80 yowf quit smoking 1980's no respiratory then outdoor bike riding caused sob/nasal congestion > allergy eval  [place] dust/ mold / sinus dz > surgery by ENT here in [place] > much better s inhalers after surgery on maint singulair/ zytrec then Fall 2015 sob / wheeze better with saba and gone with prednisone    Admit date: 04/09/2015 Discharge date: 04/12/2015  PCP: Nyoka Cowden, MD  DISCHARGE DIAGNOSES:  Principal Problem:  Asthma exacerbation Active Problems:  Hypothyroidism  HYPERCHOLESTEROLEMIA, PURE  Essential hypertension  GERD  Obesity  Acute respiratory failure with hypoxia  Chronic cough  Acute renal failure superimposed on stage 4 chronic kidney disease   RECOMMENDATIONS FOR OUTPATIENT FOLLOW UP: 1. Her losartan and hydrochlorothiazide has been held for now due to her renal insufficiency 2. She has been started on amlodipine instead  3. Please consider referral to pulmonology for her ongoing respiratory symptoms  DISCHARGE CONDITION: fair  Diet: low-sodium  Filed Weights   04/09/15 1730  Weight: 110.3 kg (243 lb 2.7 oz)    INITIAL HISTORY: 80 year old Caucasian female presented with cough, wheezing and difficulty breathing. She was admitted for further management of acute asthma exacerbation.  HOSPITAL COURSE:   Acute Asthma exacerbation This was likely precipitated by acute viral bronchitis. Patient was started on steroids, nebulizer treatments. She slowly improved. She has been ambulating without difficulty. Saturating well on room air. She'll be discharged on steroid taper. Nebulizer machine and albuterol nebulizer solution will be prescribed. She is already on cetirizine and Singulair along with inhaled steroids. Apparently has had problems with the wheezing for the past few months when they have been occurring more frequently. I have  asked her to discuss this issue with her PCP and consider referral to pulmonology for further evaluation including PFTs & for chronic cough.  Acute respiratory failure with hypoxia Resolved. This was secondary to asthma exacerbation. Management as above. VQ scan was negative for pulmonary embolism.  Acute renal failure superimposed on stage 4 chronic kidney disease Baseline creatinine in the 1.8-2.0 range. Her current renal function is close to baseline. BUN is higher. She was gently hydrated. We will hold her hydrochlorothiazide and ARB. She should follow-up with her PCP with the same. She also is followed by nephrologist, Dr. Lorrene Reid.  Hypothyroidism Continue Synthroid  HYPERCHOLESTEROLEMIA, PURE Continue statins  Essential hypertension Hold the diuretic and ARB for now. Amlodipine has been initiated instead.  GERD PPI  Obesity Management as outpatient  Elevated d-dimer VQ scan negative. No DVT on lower extremity Dopplers.  Prolonged QTC Monitor. Magnesium in low normal range. This was repleted orally.        07/29/2015 1st [facility] office visit/ [facility]   Chief Complaint  Patient presents with  . Pulmonary Consult    Referred by Dr. Levin Erp. Pt c/o SOB for the past several months. She states that she gets out of breath when she walks from her house to her car.  She also c/o non prod cough- worse when she lies down at night and first thing in the am.   started dulera 200 started early may 2016 plus maint on singulair  And no longer using saba but doesn't understand how to use it  Even pred did not improve main cc =  doe x house to car persistent since fall 2015 and can't do but one flight steps  either  Cough and breathing settle down overnight but worse at hs and in am, never productive now.  Better on dulera 200 than qvar  No obvious  patterns in day to day or daytime variabilty or assoc  cp or chest tightness, subjective wheeze overt sinus or hb symptoms. No  unusual exp hx or h/o childhood pna/ asthma or knowledge of premature birth.  Also denies any obvious fluctuation of symptoms with weather or environmental changes or other aggravating or alleviating factors except as outlined above   Current Medications, Allergies, Complete Past Medical History, Past Surgical History, Family History, and Social History were reviewed in [employer] record.                 Review of Systems  Constitutional: Negative for fever, chills and unexpected weight change.  HENT: Negative for congestion, dental problem, ear pain, nosebleeds, postnasal drip, rhinorrhea, sinus pressure, sneezing, sore throat, trouble swallowing and voice change.   Eyes: Negative for visual disturbance.  Respiratory: Positive for cough and shortness of breath. Negative for choking.   Cardiovascular: Positive for leg swelling. Negative for chest pain.  Gastrointestinal: Negative for vomiting, abdominal pain and diarrhea.  Genitourinary: Negative for difficulty urinating.  Musculoskeletal: Negative for arthralgias.  Skin: Negative for rash.  Neurological: Negative for tremors, syncope and headaches.  Hematological: Does not bruise/bleed easily.       Objective:   Physical Exam  amb hoarse wf nad with classic pseudowheeze   Wt Readings from Last 3 Encounters:  07/29/15 242 lb 6.4 oz (109.952 kg)  06/30/15 234 lb (106.142 kg)  05/01/15 243 lb 9.6 oz (110.496 kg)    Vital signs reviewed   HEENT: nl dentition, turbinates, and orophanx. Nl external ear canals without cough reflex   NECK :  without JVD/Nodes/TM/ nl carotid upstrokes bilaterally   LUNGS: no acc muscle use, clear to A and P bilaterally without cough on insp or exp maneuvers   CV:  RRR  no s3 or murmur or increase in P2, no edema   ABD:  Tensely obese/  nontender with limited excursion in the supine position. No bruits or organomegaly, bowel sounds nl  MS:  warm without  deformities, calf tenderness, cyanosis or clubbing  SKIN: warm and dry without lesions    NEURO:  alert, approp, no deficits      I personally reviewed images   CXR:  .07/29/2015   no acute changes (radiology ? LL as dz but I think this is bone density on lateral as no correlation on PA)  Labs ordered/ reviewed:   Lab 07/29/15 1719  NA 141  K 4.3  CL 104  CO2 26  BUN 21  CREATININE 1.56*  GLUCOSE 82      Lab 07/29/15 1719  HGB 12.7  HCT 38.5  WBC 8.3  PLT 251.0     Lab Results  Component Value Date   TSH 1.37 07/29/2015     Lab Results  Component Value Date   PROBNP 222.0* 07/29/2015               Assessment & Plan:

## 2015-07-30 ENCOUNTER — Encounter: Payer: Self-pay | Admitting: Internal Medicine

## 2015-07-30 LAB — BASIC METABOLIC PANEL
BUN: 21 mg/dL (ref 6–23)
CO2: 26 mEq/L (ref 19–32)
CREATININE: 1.56 mg/dL — AB (ref 0.40–1.20)
Calcium: 10.3 mg/dL (ref 8.4–10.5)
Chloride: 104 mEq/L (ref 96–112)
GFR: 34.13 mL/min — ABNORMAL LOW (ref >60.00)
Glucose, Bld: 82 mg/dL (ref 70–99)
Potassium: 4.3 mEq/L (ref 3.5–5.1)
Sodium: 141 mEq/L (ref 135–145)

## 2015-07-30 LAB — TSH: TSH: 1.37 u[IU]/mL (ref 0.35–4.50)

## 2015-07-30 LAB — BRAIN NATRIURETIC PEPTIDE: Pro B Natriuretic peptide (BNP): 222 pg/mL — ABNORMAL HIGH (ref 0.0–100.0)

## 2015-07-30 NOTE — Progress Notes (Signed)
Quick Note:  LMTCB ______ 

## 2015-07-30 NOTE — Assessment & Plan Note (Signed)
The most common causes of chronic cough in immunocompetent adults include the following: upper airway cough syndrome (UACS), previously referred to as postnasal drip syndrome (PNDS), which is caused by variety of rhinosinus conditions; (2) asthma; (3) GERD; (4) chronic bronchitis from cigarette smoking or other inhaled environmental irritants; (5) nonasthmatic eosinophilic bronchitis; and (6) bronchiectasis.   These conditions, singly or in combination, have accounted for up to 94% of the causes of chronic cough in prospective studies.   Other conditions have constituted no >6% of the causes in prospective studies These have included bronchogenic carcinoma, chronic interstitial pneumonia, sarcoidosis, left ventricular failure, ACEI-induced cough, and aspiration from a condition associated with pharyngeal dysfunction.    Chronic cough is often simultaneously caused by more than one condition. A single cause has been found from 38 to 82% of the time, multiple causes from 18 to 62%. Multiply caused cough has been the result of three diseases up to 42% of the time.       Based on hx and exam, this is most likely:  Classic Upper airway cough syndrome, so named because it's frequently impossible to sort out how much is  CR/sinusitis with freq throat clearing (which can be related to primary GERD)   vs  causing  secondary (" extra esophageal")  GERD from wide swings in gastric pressure that occur with throat clearing, often  promoting self use of mint and menthol lozenges that reduce the lower esophageal sphincter tone and exacerbate the problem further in a cyclical fashion.   These are the same pts (now being labeled as having "irritable larynx syndrome" by some cough centers) who not infrequently have a history of having failed to tolerate ace inhibitors,  dry powder inhalers or biphosphonates or report having atypical reflux symptoms that don't respond to standard doses of PPI , and are easily confused as  having aecopd or asthma flares by even experienced allergists/ pulmonologists.   The first step is to maximize acid suppression and eliminate cyclical coughing then regroup if the cough persists.  I had an extended discussion with the patient reviewing all relevant studies completed to date and  Lasting 36m    Each maintenance medication was reviewed in detail including most importantly the difference between maintenance and prns and under what circumstances the prns are to be triggered using an action plan format that is not reflected in the computer generated alphabetically organized AVS.    Please see instructions for details which were reviewed in writing and the patient given a copy highlighting the part that I personally wrote and discussed at today's ov. [name] Kitchen

## 2015-07-30 NOTE — Assessment & Plan Note (Addendum)
-   spirometry 07/15/15 > wnl  - spirometry 07/29/2015 > wnl   Symptoms are markedly disproportionate to objective findings and not clear this is a lung problem but pt does appear to have difficult airway management issues. DDX of  difficult airways management all start with A and  include Adherence, Ace Inhibitors, Acid Reflux, Active Sinus Disease, Alpha 1 Antitripsin deficiency, Anxiety masquerading as Airways dz,  ABPA,  allergy(esp in young), Aspiration (esp in elderly), Adverse effects of meds,  Active smokers, A bunch of PE's (a small clot burden can't cause this syndrome unless there is already severe underlying pulm or vascular dz with poor reserve) plus two Bs  = Bronchiectasis and Beta blocker use..and one C= CHF  Adherence is always the initial "prime suspect" and is a multilayered concern that requires a "trust but verify" approach in every patient - starting with knowing how to use medications, especially inhalers, correctly, keeping up with refills and understanding the fundamental difference between maintenance and prns vs those medications only taken for a very short course and then stopped and not refilled.  The proper method of use, as well as anticipated side effects, of a metered-dose inhaler are discussed and demonstrated to the patient. Improved effectiveness after extensive coaching during this visit to a level of approximately  90% so try dulera 100 2bid   ? Acid (or non-acid) GERD > always difficult to exclude as up to 75% of pts in some series report no assoc GI/ Heartburn symptoms> rec max (24h)  acid suppression and diet restrictions/ reviewed and instructions given in writing.   ? Active sinus dz > sinus ct next   ? Allergy/asthma> hard to be sure how much contributing but certainly has nl flows now / no change in rx needed at this point = dulera 100 2bid and singulair   ? CHF >  bnp intermediate /may need repeat echo (last done in 2009)

## 2015-07-31 NOTE — Progress Notes (Signed)
Quick Note:  LMTCB ______ 

## 2015-08-04 ENCOUNTER — Telehealth: Payer: Self-pay | Admitting: Internal Medicine

## 2015-08-04 NOTE — Telephone Encounter (Signed)
Result Note     Call patient : Studies are unremarkable, no change in recs    Result Note     Call pt: Reviewed cxr and no acute changes seen - radiology suggested slt changes in LLL so rec repeat cxr next ov to confirm this is artifact   I spoke with patient about results and she verbalized understanding and had no questions.

## 2015-08-14 ENCOUNTER — Encounter: Payer: Self-pay | Admitting: Internal Medicine

## 2015-08-21 ENCOUNTER — Ambulatory Visit (INDEPENDENT_AMBULATORY_CARE_PROVIDER_SITE_OTHER): Payer: Medicare Other

## 2015-08-21 DIAGNOSIS — Z23 Encounter for immunization: Secondary | ICD-10-CM | POA: Diagnosis not present

## 2015-08-25 ENCOUNTER — Encounter: Payer: Self-pay | Admitting: Internal Medicine

## 2015-08-26 ENCOUNTER — Encounter: Payer: Self-pay | Admitting: Internal Medicine

## 2015-08-26 ENCOUNTER — Ambulatory Visit (INDEPENDENT_AMBULATORY_CARE_PROVIDER_SITE_OTHER): Payer: Medicare Other | Admitting: Internal Medicine

## 2015-08-26 VITALS — BP 124/78 | HR 105 | Ht 65.5 in | Wt 235.0 lb

## 2015-08-26 DIAGNOSIS — R0600 Dyspnea, unspecified: Secondary | ICD-10-CM

## 2015-08-26 DIAGNOSIS — R05 Cough: Secondary | ICD-10-CM | POA: Diagnosis not present

## 2015-08-26 DIAGNOSIS — E669 Obesity, unspecified: Secondary | ICD-10-CM | POA: Diagnosis not present

## 2015-08-26 DIAGNOSIS — R053 Chronic cough: Secondary | ICD-10-CM

## 2015-08-26 MED ORDER — PREDNISONE 10 MG PO TABS: ORAL_TABLET | ORAL | Status: DC

## 2015-08-26 MED ORDER — FLUTTER DEVI: Status: DC

## 2015-08-26 NOTE — Assessment & Plan Note (Signed)
Body mass index is 38.5   Lab Results  Component Value Date   TSH 1.37 07/29/2015     Contributing to gerd tendency/ doe/reviewed need  achieve and maintain neg calorie balance > defer f/u primary care including intermittently monitoring thyroid status

## 2015-08-26 NOTE — Assessment & Plan Note (Addendum)
-   spirometry 07/15/15 > wnl  - spirometry 07/29/2015 > wnl   Still disproportionate to objective findings > hrct next step and walking sats next ov

## 2015-08-26 NOTE — Patient Instructions (Addendum)
Please see patient coordinator before you leave today  to schedule sinus and chest CT and we will call you with the results  Prednisone 10 mg take  4 each am x 2 days,   2 each am x 2 days,  1 each am x 2 days and stop   Cough into the flutter valve   Pantoprazole (protonix) 40 mg   Take  30-60 min before first meal of the day and Pepcid (famotidine)  20 mg one @  bedtime until return to office - this is the best way to tell whether stomach acid is contributing to your problem.    GERD (REFLUX)  is an extremely common cause of respiratory symptoms just like yours , many times with no obvious heartburn at all.    It can be treated with medication, but also with lifestyle changes including elevation of the head of your bed (ideally with 6 inch  bed blocks),  Smoking cessation, avoidance of late meals, excessive alcohol, and avoid fatty foods, chocolate, peppermint, colas, red wine, and acidic juices such as orange juice.  NO MINT OR MENTHOL PRODUCTS SO NO COUGH DROPS  USE SUGARLESS CANDY INSTEAD (Jolley ranchers or Stover's or Life Savers) or even ice chips will also do - the key is to swallow to prevent all throat clearing. NO OIL BASED VITAMINS - use powdered substitutes.    Please schedule a follow up office visit in 4 weeks, sooner if needed with all meds in hand to verify the accuracy of our list  - needs walking sats and consider echo next ov

## 2015-08-26 NOTE — Assessment & Plan Note (Addendum)
Followed in Pulmonary clinic/ [facility]/ Arvon Schreiner  - Sinus CT 08/26/2015 >>> - HRCT 08/26/2015 >>>  - flutter valve 08/26/2015   Partially steroid resp in setting of h/o atopy is c/w cough variant asthma/ eos bronchitis or chronic rhinitis with pnds  Next step is sinus CT and max rx for gerd/ h1 h2/ flutter valve training to try to prevent cyclical cough   I had an extended discussion with the patient reviewing all relevant studies completed to date and  lasting 15 to 20 minutes of a 25 minute visit    Each maintenance medication was reviewed in detail including most importantly the difference between maintenance and prns and under what circumstances the prns are to be triggered using an action plan format that is not reflected in the computer generated alphabetically organized AVS.    Please see instructions for details which were reviewed in writing and the patient given a copy highlighting the part that I personally wrote and discussed at today's ov.

## 2015-08-26 NOTE — Progress Notes (Signed)
Subjective:    Patient ID: [name], female    DOB: 02-Jul-1935,    MRN: [id number]    Brief patient profile:  75 yowf quit smoking 1980's no respiratory problems then outdoor bike riding caused sob/nasal congestion > allergy eval  Bardelas dust/ mold / sinus dz > surgery by ENT here in [place] > much better s inhalers after surgery on maint singulair/ zytrec then Fall 2015 sob / wheeze better with saba and gone with prednisone    Admit date: 04/09/2015 Discharge date: 04/12/2015  PCP: Nyoka Cowden, MD  DISCHARGE DIAGNOSES:  Principal Problem:  Asthma exacerbation Active Problems:  Hypothyroidism  HYPERCHOLESTEROLEMIA, PURE  Essential hypertension  GERD  Obesity  Acute respiratory failure with hypoxia  Chronic cough  Acute renal failure superimposed on stage 4 chronic kidney disease   RECOMMENDATIONS FOR OUTPATIENT FOLLOW UP: 1. Her losartan and hydrochlorothiazide has been held for now due to her renal insufficiency 2. She has been started on amlodipine instead  3. Please consider referral to pulmonology for her ongoing respiratory symptoms  DISCHARGE CONDITION: fair  Diet: low-sodium  Filed Weights   04/09/15 1730  Weight: 110.3 kg (243 lb 2.7 oz)    INITIAL HISTORY: 80 year old Caucasian female presented with cough, wheezing and difficulty breathing. She was admitted for further management of acute asthma exacerbation.  HOSPITAL COURSE:   Acute Asthma exacerbation This was likely precipitated by acute viral bronchitis. Patient was started on steroids, nebulizer treatments. She slowly improved. She has been ambulating without difficulty. Saturating well on room air. She'll be discharged on steroid taper. Nebulizer machine and albuterol nebulizer solution will be prescribed. She is already on cetirizine and Singulair along with inhaled steroids. Apparently has had problems with the wheezing for the past few months when they have been  occurring more frequently. I have asked her to discuss this issue with her PCP and consider referral to pulmonology for further evaluation including PFTs & for chronic cough.  Acute respiratory failure with hypoxia Resolved. This was secondary to asthma exacerbation. Management as above. VQ scan was negative for pulmonary embolism.  Acute renal failure superimposed on stage 4 chronic kidney disease Baseline creatinine in the 1.8-2.0 range. Her current renal function is close to baseline. BUN is higher. She was gently hydrated. We will hold her hydrochlorothiazide and ARB. She should follow-up with her PCP with the same. She also is followed by nephrologist, Dr. Lorrene Reid.  Hypothyroidism Continue Synthroid  HYPERCHOLESTEROLEMIA, PURE Continue statins  Essential hypertension Hold the diuretic and ARB for now. Amlodipine has been initiated instead.  GERD PPI  Obesity Management as outpatient  Elevated d-dimer VQ scan negative. No DVT on lower extremity Dopplers.  Prolonged QTC Monitor. Magnesium in low normal range. This was repleted orally.        07/29/2015 1st [facility] office visit/ [name]   Chief Complaint  Patient presents with  . Pulmonary Consult    Referred by Dr. Levin Erp. Pt c/o SOB for the past several months. She states that she gets out of breath when she walks from her house to her car.  She also c/o non prod cough- worse when she lies down at night and first thing in the am.   started dulera 200 started early may 2016 plus maint on singulair  And no longer using saba but doesn't understand how to use it  Even pred did not improve main cc =  doe x house to car persistent since fall 2015 and can't  do but one flight steps either  Cough and breathing settle down overnight but worse at hs and in am, never productive now.  Better on dulera 200 than qvar rec Nexium 40 mg   Take  30-60 min before first meal of the day and Pepcid (famotidine)  20 mg one @   bedtime until return to office > did not start pepcid Change dulera to 100 Take 2 puffs first thing in am and then another 2 puffs about 12 hours later.  Only use your albuterol as a rescue medication Only use your albuterol neb if your try the inhaler first and it fails to solve your breathing problem  Please remember to go to the lab and x-ray department downstairs for your tests - we will call you with the results when they are available.  Please schedule a follow up office visit in 4 weeks, sooner if needed    08/26/2015 f/u ov/[name] re: unexplained sob/ cough x fall 2015  Chief Complaint  Patient presents with  . Follow-up    Pt states that her breathing is unchanged. She started coughing up sputum about 2 wks ago- yellow/green.    No change doe house to car. Cough worse at hs and in am/ pred seems to help transiently the breathing and tessilon helps the cough /maint rx with dulera 100 and singulair - less need for saba while on dulera 100   No obvious day to day or daytime variability or assoc   cp or chest tightness, subjective wheeze or overt sinus or hb symptoms. No unusual exp hx or h/o childhood pna/ asthma or knowledge of premature birth.  Sleeping ok without nocturnal  or early am exacerbation  of respiratory  c/o's or need for noct saba. Also denies any obvious fluctuation of symptoms with weather or environmental changes or other aggravating or alleviating factors except as outlined above   Current Medications, Allergies, Complete Past Medical History, Past Surgical History, Family History, and Social History were reviewed in [employer] record.  ROS  The following are not active complaints unless bolded sore throat, dysphagia, dental problems, itching, sneezing,  nasal congestion or excess/ purulent secretions, ear ache,   fever, chills, sweats, unintended wt loss, classically pleuritic or exertional cp, hemoptysis,  orthopnea pnd or leg swelling,  presyncope, palpitations, abdominal pain, anorexia, nausea, vomiting, diarrhea  or change in bowel or bladder habits, change in stools or urine, dysuria,hematuria,  rash, arthralgias, visual complaints, headache, numbness, weakness or ataxia or problems with walking or coordination,  change in mood/affect or memory.                           Objective:   Physical Exam  amb hoarse wf nad with mild nasal tone to voice  08/26/2015           235  Wt Readings from Last 3 Encounters:  07/29/15 242 lb 6.4 oz (109.952 kg)  06/30/15 234 lb (106.142 kg)  05/01/15 243 lb 9.6 oz (110.496 kg)    Vital signs reviewed   HEENT: nl dentition, turbinates, and orophanx. Nl external ear canals without cough reflex   NECK :  without JVD/Nodes/TM/ nl carotid upstrokes bilaterally   LUNGS: no acc muscle use, clear to A and P bilaterally without cough on insp or exp maneuvers   CV:  RRR  no s3 or murmur or increase in P2, no edema   ABD:  Tensely obese/  nontender with limited excursion in the supine position. No bruits or organomegaly, bowel sounds nl  MS:  warm without deformities, calf tenderness, cyanosis or clubbing  SKIN: warm and dry without lesions    NEURO:  alert, approp, no deficits      I personally reviewed images   CXR:   07/29/2015   no acute changes (radiology ? LL as dz but I think this is bone density on lateral as no correlation on PA)  Labs  reviewed:   Lab 07/29/15 1719  NA 141  K 4.3  CL 104  CO2 26  BUN 21  CREATININE 1.56*  GLUCOSE 82      Lab 07/29/15 1719  HGB 12.7  HCT 38.5  WBC 8.3  PLT 251.0  Eos 0.1    Lab Results  Component Value Date   TSH 1.37 07/29/2015     Lab Results  Component Value Date   PROBNP 222.0* 07/29/2015               Assessment & Plan:

## 2015-09-01 ENCOUNTER — Ambulatory Visit (INDEPENDENT_AMBULATORY_CARE_PROVIDER_SITE_OTHER)
Admission: RE | Admit: 2015-09-01 | Discharge: 2015-09-01 | Disposition: A | Discharge status: Home / Self Care | Payer: Medicare Other | Source: Ambulatory Visit | Attending: Internal Medicine | Admitting: Internal Medicine

## 2015-09-01 DIAGNOSIS — R05 Cough: Secondary | ICD-10-CM | POA: Diagnosis not present

## 2015-09-01 DIAGNOSIS — R053 Chronic cough: Secondary | ICD-10-CM

## 2015-09-02 ENCOUNTER — Telehealth: Payer: Self-pay | Admitting: *Deleted

## 2015-09-02 NOTE — Progress Notes (Signed)
Quick Note:  LMTCB ______ 

## 2015-09-02 NOTE — Telephone Encounter (Signed)
Called and spoke with pt regarding Sinus CT results.  Pt will make appt with her current ENT asap.  Pt would also like results of Chest Ct in EMR.  Please advise

## 2015-09-02 NOTE — Telephone Encounter (Signed)
Already addressed, see result note

## 2015-09-23 ENCOUNTER — Ambulatory Visit (INDEPENDENT_AMBULATORY_CARE_PROVIDER_SITE_OTHER): Payer: Medicare Other | Admitting: Internal Medicine

## 2015-09-23 ENCOUNTER — Encounter: Payer: Self-pay | Admitting: Internal Medicine

## 2015-09-23 VITALS — BP 122/86 | HR 104 | Ht 65.5 in | Wt 235.0 lb

## 2015-09-23 DIAGNOSIS — R05 Cough: Secondary | ICD-10-CM | POA: Diagnosis not present

## 2015-09-23 DIAGNOSIS — R0600 Dyspnea, unspecified: Secondary | ICD-10-CM | POA: Diagnosis not present

## 2015-09-23 DIAGNOSIS — E669 Obesity, unspecified: Secondary | ICD-10-CM

## 2015-09-23 DIAGNOSIS — J45991 Cough variant asthma: Secondary | ICD-10-CM

## 2015-09-23 DIAGNOSIS — R053 Chronic cough: Secondary | ICD-10-CM

## 2015-09-23 NOTE — Patient Instructions (Signed)
Work on inhaler technique:  relax and gently blow all the way out then take a nice smooth deep breath back in, triggering the inhaler at same time you start breathing in.  Hold for up to 5 seconds if you can. Blow out thru nose. Rinse and gargle with water when done  No change dulera 100 Take 2 puffs first thing in am and then another 2 puffs about 12 hours later.   See if using your albuterol 2 pffs 15 min before activity helps your tolerance  Please see patient coordinator before you leave today  to schedule Echocardiogram so I can clear you for surgery

## 2015-09-23 NOTE — Progress Notes (Signed)
Subjective:    Patient ID: [name], female    DOB: October 31, 1935,    MRN: [id number]    Brief patient profile:  89 yowf quit smoking 1980's no respiratory problems then outdoor bike riding caused sob/nasal congestion > allergy eval  B[place]el[place] dust/ mold / sinus dz > surgery by ENT here in [place] > much better s inhalers after surgery on maint singulair/ zytrec then Fall 2015 sob / wheeze better with saba and gone with prednisone.   Admit date: 04/09/2015 Discharge date: 04/12/2015    DISCHARGE DIAGNOSES:  Principal Problem:  Asthma exacerbation Active Problems:  Hypothyroidism  HYPERCHOLESTEROLEMIA, PURE  Essential hypertension  GERD  Obesity  Acute respiratory failure with hypoxia  Chronic cough  Acute renal failure superimposed on stage 4 chronic kidney disease   RECOMMENDATIONS FOR OUTPATIENT FOLLOW UP: 1. Her losartan and hydrochlorothiazide has been held for now due to her renal insufficiency 2. She has been started on amlodipine instead  3. Please consider referral to pulmonology for her ongoing respiratory symptoms  DISCHARGE CONDITION: fair  Diet: low-sodium  Filed Weights   04/09/15 1730  Weight: 110.3 kg (243 lb 2.7 oz)    INITIAL HISTORY: 80 year old Caucasian female presented with cough, wheezing and difficulty breathing. She was admitted for further management of acute asthma exacerbation.  HOSPITAL COURSE:   Acute Asthma exacerbation This was likely precipitated by acute viral bronchitis. Patient was started on steroids, nebulizer treatments. She slowly improved. She has been ambulating without difficulty. Saturating well on room air. She'll be discharged on steroid taper. Nebulizer machine and albuterol nebulizer solution will be prescribed. She is already on cetirizine and Singulair along with inhaled steroids. Apparently has had problems with the wheezing for the past few months when they have been occurring more frequently. I have asked  her to discuss this issue with her PCP and consider referral to pulmonology for further evaluation including PFTs & for chronic cough.  Acute respiratory failure with hypoxia Resolved. This was secondary to asthma exacerbation. Management as above. VQ scan was negative for pulmonary embolism.  Acute renal failure superimposed on stage 4 chronic kidney disease Baseline creatinine in the 1.8-2.0 range. Her current renal function is close to baseline. BUN is higher. She was gently hydrated. We will hold her hydrochlorothiazide and ARB. She should follow-up with her PCP with the same. She also is followed by nephrologist, Dr. Lorrene Reid.  Hypothyroidism Continue Synthroid  HYPERCHOLESTEROLEMIA, PURE Continue statins  Essential hypertension Hold the diuretic and ARB for now. Amlodipine has been initiated instead.  GERD PPI  Obesity Management as outpatient  Elevated d-dimer VQ scan negative. No DVT on lower extremity Dopplers.  Prolonged QTC Monitor. Magnesium in low normal range. This was repleted orally.        07/29/2015 1st [facility] office visit/ Valdis Bevill   Chief Complaint  Patient presents with  . Pulmonary Consult    Referred by Dr. Levin Erp. Pt c/o SOB for the past several months. She states that she gets out of breath when she walks from her house to her car.  She also c/o non prod cough- worse when she lies down at night and first thing in the am.   started dulera 200 started early may 2016 plus maint on singulair  And no longer using saba but doesn't understand how to use it Even pred did not improve main cc =  doe x house to car persistent since fall 2015 and can't do but one flight steps  either  Cough and breathing settle down overnight but worse at hs and in am, never productive now.  Better on dulera 200 than qvar rec Nexium 40 mg   Take  30-60 min before first meal of the day and Pepcid (famotidine)  20 mg one @  bedtime until return to office > did not start  pepcid Change dulera to 100 Take 2 puffs first thing in am and then another 2 puffs about 12 hours later.  Only use your albuterol as a rescue medication Only use your albuterol neb if your try the inhaler first and it fails to solve your breathing problem  Please remember to go to the lab and x-ray department downstairs for your tests - we will call you with the results when they are available.  Please schedule a follow up office visit in 4 weeks, sooner if needed    08/26/2015 f/u ov/Caira Poche re: unexplained sob/ cough x fall 2015  Chief Complaint  Patient presents with  . Follow-up    Pt states that her breathing is unchanged. She started coughing up sputum about 2 wks ago- yellow/green.   No change doe house to car since spring 2016 . Cough worse at hs and in am/ pred seems to help transiently the breathing and tessilon helps the cough /maint rx with dulera 100 and singulair - less need for saba while on dulera 100  rec Please see patient coordinator before you leave today  to schedule sinus and chest CT and we will call you with the results Prednisone 10 mg take  4 each am x 2 days,   2 each am x 2 days,  1 each am x 2 days and stop  Cough into the flutter valve  Pantoprazole (protonix) 40 mg   Take  30-60 min before first meal of the day and Pepcid (famotidine)  20 mg one @  bedtime until return to office - this is the best way to tell whether stomach acid is contributing to your problem.   GERD (REFLUX) diet   Please schedule a follow up office visit in 4 weeks, sooner if needed with all meds in hand to verify the accuracy of our list  - needs walking sats and consider echo next ov     09/23/2015  f/u ov/Hadrian Yarbrough re: pre op clearance/ requested by Dr Janace Hoard  / main on dulera 100 2bid and singulair  Chief Complaint  Patient presents with  . Pulmonary Consult    Pt needing pulmonary clearance for sinus surgery to be done by Dr. Janace Hoard.Pt states her breathing is unchanged, has not needed  albuterol since last visit. She is not coughing much- only in the am.     Cough is better, not bothering sleep/ breathing is about the same = sits down half way thru [employer], never tries saba before exertion.  No obvious day to day or daytime variability or assoc  cp or chest tightness, subjective wheeze or  hb symptoms. No unusual exp hx or h/o childhood pna/ asthma or knowledge of premature birth.  Sleeping ok without nocturnal  or early am exacerbation  of respiratory  c/o's or need for noct saba. Also denies any obvious fluctuation of symptoms with weather or environmental changes or other aggravating or alleviating factors except as outlined above   Current Medications, Allergies, Complete Past Medical History, Past Surgical History, Family History, and Social History were reviewed in [employer] record.  ROS  The following are not active  complaints unless bolded sore throat, dysphagia, dental problems, itching, sneezing,  nasal congestion or excess/ purulent secretions, ear ache,   fever, chills, sweats, unintended wt loss, classically pleuritic or exertional cp, hemoptysis,  orthopnea pnd or leg swelling, presyncope, palpitations, abdominal pain, anorexia, nausea, vomiting, diarrhea  or change in bowel or bladder habits, change in stools or urine, dysuria,hematuria,  rash, arthralgias, visual complaints, headache, numbness, weakness or ataxia or problems with walking or coordination,  change in mood/affect or memory.                       Objective:   Physical Exam  amb hoarse wf nad with mild nasal tone to voice   08/26/2015           235  >  09/23/2015     235  Wt Readings from Last 3 Encounters:  07/29/15 242 lb 6.4 oz (109.952 kg)  06/30/15 234 lb (106.142 kg)  05/01/15 243 lb 9.6 oz (110.496 kg)    Vital signs reviewed   HEENT: nl dentition, turbinates, and orophanx. Nl external ear canals without cough reflex   NECK :  without JVD/Nodes/TM/ nl  carotid upstrokes bilaterally   LUNGS: no acc muscle use, clear to A and P bilaterally without cough on insp or exp maneuvers - prominent pseudowheeze   CV:  RRR  no s3 or murmur or increase in P2, no edema   ABD:  Tensely obese/  nontender with limited excursion in the supine position. No bruits or organomegaly, bowel sounds nl  MS:  warm without deformities, calf tenderness, cyanosis or clubbing  SKIN: warm and dry without lesions    NEURO:  alert, approp, no deficits      I personally reviewed images   CXR:   07/29/2015   no acute changes (radiology ? LL as dz but I think this is bone density on lateral as no correlation on PA)  Labs  reviewed:   Lab 07/29/15 1719  NA 141  K 4.3  CL 104  CO2 26  BUN 21  CREATININE 1.56*  GLUCOSE 82      Lab 07/29/15 1719  HGB 12.7  HCT 38.5  WBC 8.3  PLT 251.0  Eos 0.1    Lab Results  Component Value Date   TSH 1.37 07/29/2015     Lab Results  Component Value Date   PROBNP 222.0* 07/29/2015               Assessment & Plan:

## 2015-09-23 NOTE — Assessment & Plan Note (Addendum)
Sinus CT 09/01/2015 > severe chronic structural changes > refer to ENT  - Chest CT 09/01/15 > min scarring no ild/no bronchiectasis  Still strongly suspect that most of her problems are  Classic Upper airway cough syndrome, so named because it's frequently impossible to sort out how much is  CR/sinusitis with freq throat clearing (which can be related to primary GERD)   vs  causing  secondary (" extra esophageal")  GERD from wide swings in gastric pressure that occur with throat clearing, often  promoting self use of mint and menthol lozenges that reduce the lower esophageal sphincter tone and exacerbate the problem further in a cyclical fashion.   These are the same pts (now being labeled as having "irritable larynx syndrome" by some cough centers) who not infrequently have a history of having failed to tolerate ace inhibitors,  dry powder inhalers or biphosphonates or report having atypical reflux symptoms that don't respond to standard doses of PPI , and are easily confused as having aecopd or asthma flares by even experienced allergists/ pulmonologists.   Needs to go ahead with sinus surgery if possible and in meantime rx gerd aggressively

## 2015-09-23 NOTE — Assessment & Plan Note (Signed)
Body mass index is 38.5   Lab Results  Component Value Date   TSH 1.37 07/29/2015     Contributing to gerd tendency/ doe/reviewed the need and the process to achieve and maintain neg calorie balance > defer f/u primary care including intermittently monitoring thyroid status

## 2015-09-23 NOTE — Assessment & Plan Note (Signed)
-   spirometry 07/15/15 > wnl  - spirometry 07/29/2015 > wnl  - 09/23/2015   Walked RA  2 laps @ 185 ft each stopped due to  Back with ok sats nl pace  - echo ordered 09/23/2015   Symptoms are markedly disproportionate to objective findings and not clear this is a lung problem but pt does appear to have difficult airway management issues.  DDX of  difficult airways management all start with A and  include Adherence, Ace Inhibitors, Acid Reflux, Active Sinus Disease, Alpha 1 Antitripsin deficiency, Anxiety masquerading as Airways dz,  ABPA,  allergy(esp in young), Aspiration (esp in elderly), Adverse effects of meds,  Active smokers, A bunch of PE's (a small clot burden can't cause this syndrome unless there is already severe underlying pulm or vascular dz with poor reserve) plus two Bs  = Bronchiectasis and Beta blocker use..and one C= CHF  Adherence is always the initial "prime suspect" and is a multilayered concern that requires a "trust but verify" approach in every patient - starting with knowing how to use medications, especially inhalers, correctly, keeping up with refills and understanding the fundamental difference between maintenance and prns vs those medications only taken for a very short course and then stopped and not refilled.   ? Acid (or non-acid) GERD > always difficult to exclude as up to 75% of pts in some series report no assoc GI/ Heartburn symptoms> rec continue max (24h)  acid suppression and diet restrictions/ reviewed     ? Active sinus dz > proceed with sinus surgery p check ehco  ? Allergies/ asthma > see hicks/ rx singulair and dulera 100 2bid should be adequate  ? Anxiety/obesity/deconditioning  > usually at the bottom of this list of usual suspects but should be higher in pt   already on psychotropics  .  ? chf > check echo pre op  I had an extended discussion with the patient reviewing all relevant studies completed to date and  lasting 35 minutes    Each maintenance  medication was reviewed in detail including most importantly the difference between maintenance and prns and under what circumstances the prns are to be triggered using an action plan format that is not reflected in the computer generated alphabetically organized AVS.    Please see instructions for details which were reviewed in writing and the patient given a copy highlighting the part that I personally wrote and discussed at today's ov.

## 2015-09-23 NOTE — Assessment & Plan Note (Signed)
-   09/23/2015   > trial of dulera 100 2bid > improved 09/23/2015   This component of her problem is improved and she is cleared for surgery

## 2015-09-25 ENCOUNTER — Other Ambulatory Visit: Payer: Self-pay | Admitting: Internal Medicine

## 2015-09-25 MED ORDER — LEVOTHYROXINE SODIUM 100 MCG PO TABS: 100.0000 ug | ORAL_TABLET | Freq: Every day | ORAL | Status: DC

## 2015-09-25 NOTE — Telephone Encounter (Signed)
[facility] [place], [place] [phone]  Requesting refill of levothyroxine (SYNTHROID, LEVOTHROID) 100 MCG tablet

## 2015-10-05 ENCOUNTER — Ambulatory Visit (HOSPITAL_COMMUNITY): Payer: Medicare Other | Attending: Cardiovascular Disease

## 2015-10-05 ENCOUNTER — Other Ambulatory Visit: Payer: Self-pay

## 2015-10-05 DIAGNOSIS — I517 Cardiomegaly: Secondary | ICD-10-CM | POA: Diagnosis not present

## 2015-10-05 DIAGNOSIS — E669 Obesity, unspecified: Secondary | ICD-10-CM | POA: Insufficient documentation

## 2015-10-05 DIAGNOSIS — I10 Essential (primary) hypertension: Secondary | ICD-10-CM | POA: Insufficient documentation

## 2015-10-05 DIAGNOSIS — R0600 Dyspnea, unspecified: Secondary | ICD-10-CM | POA: Diagnosis present

## 2015-10-05 DIAGNOSIS — I071 Rheumatic tricuspid insufficiency: Secondary | ICD-10-CM | POA: Diagnosis not present

## 2015-10-05 DIAGNOSIS — I358 Other nonrheumatic aortic valve disorders: Secondary | ICD-10-CM | POA: Diagnosis not present

## 2015-10-05 DIAGNOSIS — Z6839 Body mass index (BMI) 39.0-39.9, adult: Secondary | ICD-10-CM | POA: Diagnosis not present

## 2015-10-05 DIAGNOSIS — I340 Nonrheumatic mitral (valve) insufficiency: Secondary | ICD-10-CM | POA: Insufficient documentation

## 2015-10-05 DIAGNOSIS — E7800 Pure hypercholesterolemia, unspecified: Secondary | ICD-10-CM | POA: Diagnosis not present

## 2015-10-06 NOTE — Progress Notes (Signed)
Quick Note:  LMTCB ______ 

## 2015-10-07 ENCOUNTER — Telehealth: Payer: Self-pay | Admitting: Internal Medicine

## 2015-10-07 NOTE — Telephone Encounter (Signed)
Called and spoke with pt Informed pt of results of echo per MW  Notes Recorded by Tanda Rockers, MD on 10/05/2015 at 7:22 PM Call patient : Study is unremarkable, no change in recs  Pt voiced understanding of results  Nothing further is needed

## 2015-10-08 NOTE — Progress Notes (Signed)
Quick Note:  Spoke with pt and notified of results per Dr. Wert. Pt verbalized understanding and denied any questions.  ______ 

## 2015-10-14 ENCOUNTER — Other Ambulatory Visit: Payer: Self-pay | Admitting: Otolaryngology

## 2015-10-29 ENCOUNTER — Encounter (HOSPITAL_COMMUNITY): Payer: Self-pay

## 2015-10-29 ENCOUNTER — Encounter (HOSPITAL_COMMUNITY)
Admission: RE | Admit: 2015-10-29 | Discharge: 2015-10-29 | Disposition: A | Discharge status: Home / Self Care | Payer: Medicare Other | Source: Ambulatory Visit | Attending: Otolaryngology | Admitting: Otolaryngology

## 2015-10-29 DIAGNOSIS — J328 Other chronic sinusitis: Secondary | ICD-10-CM | POA: Diagnosis not present

## 2015-10-29 DIAGNOSIS — Z01818 Encounter for other preprocedural examination: Secondary | ICD-10-CM | POA: Diagnosis present

## 2015-10-29 HISTORY — DX: Anxiety disorder, unspecified: F41.9

## 2015-10-29 HISTORY — DX: Major depressive disorder, single episode, unspecified: F32.9

## 2015-10-29 HISTORY — DX: Cough: R05

## 2015-10-29 HISTORY — DX: Cough, unspecified: R05.9

## 2015-10-29 LAB — COMPREHENSIVE METABOLIC PANEL
ALBUMIN: 3.1 g/dL — AB (ref 3.5–5.0)
ALK PHOS: 108 U/L (ref 38–126)
ALT: 17 U/L (ref 14–54)
ANION GAP: 6 (ref 5–15)
AST: 24 U/L (ref 15–41)
BILIRUBIN TOTAL: 0.4 mg/dL (ref 0.3–1.2)
BUN: 13 mg/dL (ref 6–20)
CALCIUM: 9.7 mg/dL (ref 8.9–10.3)
CO2: 27 mmol/L (ref 22–32)
Chloride: 106 mmol/L (ref 101–111)
Creatinine, Ser: 1.63 mg/dL — ABNORMAL HIGH (ref 0.44–1.00)
GFR, EST AFRICAN AMERICAN: 34 mL/min — AB (ref >60)
GFR, EST NON AFRICAN AMERICAN: 29 mL/min — AB (ref >60)
GLUCOSE: 102 mg/dL — AB (ref 65–99)
POTASSIUM: 3.8 mmol/L (ref 3.5–5.1)
Sodium: 139 mmol/L (ref 135–145)
TOTAL PROTEIN: 6 g/dL — AB (ref 6.5–8.1)

## 2015-10-29 LAB — CBC
HEMATOCRIT: 39.2 % (ref 36.0–46.0)
HEMOGLOBIN: 12.4 g/dL (ref 12.0–15.0)
MCH: 27.7 pg (ref 26.0–34.0)
MCHC: 31.6 g/dL (ref 30.0–36.0)
MCV: 87.5 fL (ref 78.0–100.0)
Platelets: 221 10*3/uL (ref 150–400)
RBC: 4.48 MIL/uL (ref 3.87–5.11)
RDW: 16.1 % — AB (ref 11.5–15.5)
WBC: 6.8 10*3/uL (ref 4.0–10.5)

## 2015-10-29 NOTE — Pre-Procedure Instructions (Signed)
    [name]  10/29/2015      [facility] [place], [place] [address] [place]  [address] Phone: [phone] Fax: [phone]  [facility] [place], [place] [id number] Phone: [phone] Fax: [facility] [phone] - [place], [place] - [address] AT [place] [address] [place] [address] Phone: [phone] Fax: [phone]    Your procedure is scheduled on 11/06/15.  Report to [facility] Admitting at 830 A.M.  Call this number if you have problems the morning of surgery:  [phone]   Remember:  Do not eat food or drink liquids after midnight.  Take these medicines the morning of surgery with A SIP OF WATER --all inhalers,amlodipine,zyrtec,cymbalta,nexium,synthroid,ultram   Do not wear jewelry, make-up or nail polish.  Do not wear lotions, powders, or perfumes.  You may wear deodorant.  Do not shave 48 hours prior to surgery.  Men may shave face and neck.  Do not bring valuables to the hospital.  [facility] is not responsible for any belongings or valuables.  Contacts, dentures or bridgework may not be worn into surgery.  Leave your suitcase in the car.  After surgery it may be brought to your room.  For patients admitted to the hospital, discharge time will be determined by your treatment team.  Patients discharged the day of surgery will not be allowed to drive home.   Name and phone number of your driver:   Special instructions:    Please read over the following fact sheets that you were given. Pain Booklet, Coughing and Deep Breathing and Surgical Site Infection Prevention

## 2015-11-05 ENCOUNTER — Ambulatory Visit: Payer: Self-pay | Admitting: Allergy and Immunology

## 2015-11-06 ENCOUNTER — Ambulatory Visit (HOSPITAL_COMMUNITY): Payer: Medicare Other | Admitting: Anesthesiology

## 2015-11-06 ENCOUNTER — Observation Stay (HOSPITAL_COMMUNITY)
Admission: RE | Admit: 2015-11-06 | Discharge: 2015-11-07 | Disposition: A | Discharge status: Home / Self Care | Payer: Medicare Other | Source: Ambulatory Visit | Attending: Otolaryngology | Admitting: Otolaryngology

## 2015-11-06 ENCOUNTER — Encounter (HOSPITAL_COMMUNITY)
Admission: RE | Disposition: A | Discharge status: Home / Self Care | Payer: Self-pay | Source: Ambulatory Visit | Attending: Otolaryngology

## 2015-11-06 ENCOUNTER — Encounter (HOSPITAL_COMMUNITY): Payer: Self-pay | Admitting: *Deleted

## 2015-11-06 DIAGNOSIS — F329 Major depressive disorder, single episode, unspecified: Secondary | ICD-10-CM | POA: Diagnosis not present

## 2015-11-06 DIAGNOSIS — F419 Anxiety disorder, unspecified: Secondary | ICD-10-CM | POA: Diagnosis not present

## 2015-11-06 DIAGNOSIS — Z6838 Body mass index (BMI) 38.0-38.9, adult: Secondary | ICD-10-CM | POA: Diagnosis not present

## 2015-11-06 DIAGNOSIS — Z87891 Personal history of nicotine dependence: Secondary | ICD-10-CM | POA: Diagnosis not present

## 2015-11-06 DIAGNOSIS — J45909 Unspecified asthma, uncomplicated: Secondary | ICD-10-CM | POA: Insufficient documentation

## 2015-11-06 DIAGNOSIS — E039 Hypothyroidism, unspecified: Secondary | ICD-10-CM | POA: Insufficient documentation

## 2015-11-06 DIAGNOSIS — Z87442 Personal history of urinary calculi: Secondary | ICD-10-CM | POA: Insufficient documentation

## 2015-11-06 DIAGNOSIS — Z90710 Acquired absence of both cervix and uterus: Secondary | ICD-10-CM | POA: Insufficient documentation

## 2015-11-06 DIAGNOSIS — J321 Chronic frontal sinusitis: Secondary | ICD-10-CM | POA: Diagnosis not present

## 2015-11-06 DIAGNOSIS — G4700 Insomnia, unspecified: Secondary | ICD-10-CM | POA: Insufficient documentation

## 2015-11-06 DIAGNOSIS — I10 Essential (primary) hypertension: Secondary | ICD-10-CM | POA: Insufficient documentation

## 2015-11-06 DIAGNOSIS — Z86010 Personal history of colonic polyps: Secondary | ICD-10-CM | POA: Insufficient documentation

## 2015-11-06 DIAGNOSIS — E7800 Pure hypercholesterolemia, unspecified: Secondary | ICD-10-CM | POA: Diagnosis not present

## 2015-11-06 DIAGNOSIS — K219 Gastro-esophageal reflux disease without esophagitis: Secondary | ICD-10-CM | POA: Diagnosis not present

## 2015-11-06 DIAGNOSIS — M1990 Unspecified osteoarthritis, unspecified site: Secondary | ICD-10-CM | POA: Insufficient documentation

## 2015-11-06 DIAGNOSIS — J341 Cyst and mucocele of nose and nasal sinus: Secondary | ICD-10-CM | POA: Diagnosis not present

## 2015-11-06 HISTORY — PX: SINUS ENDO W/FUSION: SHX777

## 2015-11-06 LAB — CBC
HCT: 37.5 % (ref 36.0–46.0)
Hemoglobin: 12.2 g/dL (ref 12.0–15.0)
MCH: 28.6 pg (ref 26.0–34.0)
MCHC: 32.5 g/dL (ref 30.0–36.0)
MCV: 88 fL (ref 78.0–100.0)
PLATELETS: 213 10*3/uL (ref 150–400)
RBC: 4.26 MIL/uL (ref 3.87–5.11)
RDW: 15.6 % — AB (ref 11.5–15.5)
WBC: 5.6 10*3/uL (ref 4.0–10.5)

## 2015-11-06 LAB — CREATININE, SERUM
Creatinine, Ser: 1.71 mg/dL — ABNORMAL HIGH (ref 0.44–1.00)
GFR calc non Af Amer: 27 mL/min — ABNORMAL LOW (ref >60)
GFR, EST AFRICAN AMERICAN: 32 mL/min — AB (ref >60)

## 2015-11-06 SURGERY — SINUS SURGERY, ENDOSCOPIC, USING COMPUTER-ASSISTED NAVIGATION
Anesthesia: General | Site: Nose | Laterality: Right

## 2015-11-06 MED ORDER — VITAMIN C 500 MG PO TABS
500.0000 mg | ORAL_TABLET | Freq: Every day | ORAL | Status: DC
  Filled 2015-11-06: qty 1

## 2015-11-06 MED ORDER — DEXTROSE-NACL 5-0.45 % IV SOLN
INTRAVENOUS | Status: DC
  Administered 2015-11-06 (×2): via INTRAVENOUS

## 2015-11-06 MED ORDER — HYDROCODONE-ACETAMINOPHEN 5-325 MG PO TABS
1.0000 | ORAL_TABLET | ORAL | Status: DC | PRN
  Administered 2015-11-07: 1 via ORAL
  Filled 2015-11-06: qty 1

## 2015-11-06 MED ORDER — PROPOFOL 10 MG/ML IV BOLUS: INTRAVENOUS | Status: DC | PRN

## 2015-11-06 MED ORDER — LEVOTHYROXINE SODIUM 100 MCG PO TABS
100.0000 ug | ORAL_TABLET | Freq: Every day | ORAL | Status: DC
  Administered 2015-11-07: 100 ug via ORAL
  Filled 2015-11-06: qty 1

## 2015-11-06 MED ORDER — FENTANYL CITRATE (PF) 250 MCG/5ML IJ SOLN
INTRAMUSCULAR | Status: AC
  Filled 2015-11-06: qty 5

## 2015-11-06 MED ORDER — OXYMETAZOLINE HCL 0.05 % NA SOLN
NASAL | Status: AC
  Filled 2015-11-06: qty 15

## 2015-11-06 MED ORDER — OXYMETAZOLINE HCL 0.05 % NA SOLN
NASAL | Status: DC | PRN
  Administered 2015-11-06: 1

## 2015-11-06 MED ORDER — ONDANSETRON HCL 4 MG/2ML IJ SOLN
INTRAMUSCULAR | Status: AC
  Filled 2015-11-06: qty 2

## 2015-11-06 MED ORDER — OXYCODONE HCL 5 MG/5ML PO SOLN: 5.0000 mg | Freq: Once | ORAL | Status: DC | PRN

## 2015-11-06 MED ORDER — LACTATED RINGERS IV SOLN
INTRAVENOUS | Status: DC | PRN
  Administered 2015-11-06 (×2): via INTRAVENOUS

## 2015-11-06 MED ORDER — ONDANSETRON 4 MG PO TBDP: 4.0000 mg | ORAL_TABLET | Freq: Four times a day (QID) | ORAL | Status: DC | PRN

## 2015-11-06 MED ORDER — ACETAMINOPHEN 325 MG PO TABS: 325.0000 mg | ORAL_TABLET | ORAL | Status: DC | PRN

## 2015-11-06 MED ORDER — LACTATED RINGERS IV SOLN
Freq: Once | INTRAVENOUS | Status: AC
  Administered 2015-11-06: 09:00:00 via INTRAVENOUS

## 2015-11-06 MED ORDER — ROCURONIUM BROMIDE 50 MG/5ML IV SOLN
INTRAVENOUS | Status: AC
  Filled 2015-11-06: qty 1

## 2015-11-06 MED ORDER — SUCCINYLCHOLINE CHLORIDE 20 MG/ML IJ SOLN
INTRAMUSCULAR | Status: AC
  Filled 2015-11-06: qty 1

## 2015-11-06 MED ORDER — LIDOCAINE-EPINEPHRINE 1 %-1:100000 IJ SOLN
INTRAMUSCULAR | Status: AC
  Filled 2015-11-06: qty 1

## 2015-11-06 MED ORDER — LIDOCAINE-EPINEPHRINE 1 %-1:100000 IJ SOLN
INTRAMUSCULAR | Status: DC | PRN
  Administered 2015-11-06: 20 mL

## 2015-11-06 MED ORDER — PANTOPRAZOLE SODIUM 40 MG PO TBEC
40.0000 mg | DELAYED_RELEASE_TABLET | Freq: Every day | ORAL | Status: DC
  Administered 2015-11-07: 40 mg via ORAL
  Filled 2015-11-06 (×2): qty 1

## 2015-11-06 MED ORDER — BENZONATATE 100 MG PO CAPS: 100.0000 mg | ORAL_CAPSULE | ORAL | Status: DC | PRN

## 2015-11-06 MED ORDER — ONE-DAILY MULTI VITAMINS PO TABS: 1.0000 | ORAL_TABLET | Freq: Every day | ORAL | Status: DC

## 2015-11-06 MED ORDER — OXYCODONE HCL 5 MG PO TABS: 5.0000 mg | ORAL_TABLET | Freq: Once | ORAL | Status: DC | PRN

## 2015-11-06 MED ORDER — FENTANYL CITRATE (PF) 100 MCG/2ML IJ SOLN: 25.0000 ug | INTRAMUSCULAR | Status: DC | PRN

## 2015-11-06 MED ORDER — MIDAZOLAM HCL 5 MG/5ML IJ SOLN
INTRAMUSCULAR | Status: DC | PRN
  Administered 2015-11-06: 0.5 mg via INTRAVENOUS

## 2015-11-06 MED ORDER — DULOXETINE HCL 30 MG PO CPEP
30.0000 mg | ORAL_CAPSULE | Freq: Every day | ORAL | Status: DC
  Filled 2015-11-06: qty 1

## 2015-11-06 MED ORDER — CEFAZOLIN SODIUM-DEXTROSE 2-3 GM-% IV SOLR
INTRAVENOUS | Status: AC
  Filled 2015-11-06: qty 50

## 2015-11-06 MED ORDER — ROCURONIUM BROMIDE 100 MG/10ML IV SOLN
INTRAVENOUS | Status: DC | PRN
  Administered 2015-11-06: 35 mg via INTRAVENOUS

## 2015-11-06 MED ORDER — ACETAMINOPHEN 160 MG/5ML PO SOLN: 325.0000 mg | ORAL | Status: DC | PRN

## 2015-11-06 MED ORDER — MOMETASONE FURO-FORMOTEROL FUM 100-5 MCG/ACT IN AERO
2.0000 | INHALATION_SPRAY | Freq: Two times a day (BID) | RESPIRATORY_TRACT | Status: DC
  Administered 2015-11-06 – 2015-11-07 (×2): 2 via RESPIRATORY_TRACT
  Filled 2015-11-06: qty 8.8

## 2015-11-06 MED ORDER — FENTANYL CITRATE (PF) 100 MCG/2ML IJ SOLN
INTRAMUSCULAR | Status: DC | PRN
  Administered 2015-11-06: 100 ug via INTRAVENOUS
  Administered 2015-11-06: 50 ug via INTRAVENOUS

## 2015-11-06 MED ORDER — LIDOCAINE HCL (CARDIAC) 20 MG/ML IV SOLN
INTRAVENOUS | Status: DC | PRN
  Administered 2015-11-06: 60 mg via INTRAVENOUS

## 2015-11-06 MED ORDER — ADULT MULTIVITAMIN W/MINERALS CH
1.0000 | ORAL_TABLET | Freq: Every day | ORAL | Status: DC
  Filled 2015-11-06: qty 1

## 2015-11-06 MED ORDER — MONTELUKAST SODIUM 10 MG PO TABS
5.0000 mg | ORAL_TABLET | Freq: Every day | ORAL | Status: DC
  Filled 2015-11-06: qty 1

## 2015-11-06 MED ORDER — ALBUTEROL SULFATE HFA 108 (90 BASE) MCG/ACT IN AERS: 2.0000 | INHALATION_SPRAY | Freq: Four times a day (QID) | RESPIRATORY_TRACT | Status: DC | PRN

## 2015-11-06 MED ORDER — MIDAZOLAM HCL 2 MG/2ML IJ SOLN
INTRAMUSCULAR | Status: AC
  Filled 2015-11-06: qty 2

## 2015-11-06 MED ORDER — TRAMADOL HCL 50 MG PO TABS
50.0000 mg | ORAL_TABLET | Freq: Four times a day (QID) | ORAL | Status: DC | PRN
Start: 2015-11-06 — End: 2015-11-07

## 2015-11-06 MED ORDER — CEFAZOLIN SODIUM-DEXTROSE 2-3 GM-% IV SOLR
INTRAVENOUS | Status: DC | PRN
  Administered 2015-11-06: 2 g via INTRAVENOUS

## 2015-11-06 MED ORDER — NEOSTIGMINE METHYLSULFATE 10 MG/10ML IV SOLN
INTRAVENOUS | Status: AC
  Filled 2015-11-06: qty 1

## 2015-11-06 MED ORDER — GLYCOPYRROLATE 0.2 MG/ML IJ SOLN
INTRAMUSCULAR | Status: AC
  Filled 2015-11-06: qty 3

## 2015-11-06 MED ORDER — LIDOCAINE HCL (CARDIAC) 20 MG/ML IV SOLN
INTRAVENOUS | Status: AC
  Filled 2015-11-06: qty 5

## 2015-11-06 MED ORDER — SODIUM CHLORIDE 0.9 % IR SOLN
Status: DC | PRN
  Administered 2015-11-06: 1000 mL

## 2015-11-06 MED ORDER — DARIFENACIN HYDROBROMIDE ER 7.5 MG PO TB24
7.5000 mg | ORAL_TABLET | Freq: Every day | ORAL | Status: DC
  Filled 2015-11-06 (×2): qty 1

## 2015-11-06 MED ORDER — NEOSTIGMINE METHYLSULFATE 10 MG/10ML IV SOLN
INTRAVENOUS | Status: DC | PRN
  Administered 2015-11-06: 4 mg via INTRAVENOUS

## 2015-11-06 MED ORDER — MUPIROCIN CALCIUM 2 % EX CREA
TOPICAL_CREAM | CUTANEOUS | Status: AC
  Filled 2015-11-06: qty 15

## 2015-11-06 MED ORDER — ONDANSETRON HCL 4 MG/2ML IJ SOLN: 4.0000 mg | Freq: Four times a day (QID) | INTRAMUSCULAR | Status: DC | PRN

## 2015-11-06 MED ORDER — PROPOFOL 10 MG/ML IV BOLUS
INTRAVENOUS | Status: DC | PRN
  Administered 2015-11-06: 140 mg via INTRAVENOUS
  Administered 2015-11-06: 20 mg via INTRAVENOUS

## 2015-11-06 MED ORDER — ALBUTEROL SULFATE HFA 108 (90 BASE) MCG/ACT IN AERS
INHALATION_SPRAY | RESPIRATORY_TRACT | Status: DC | PRN
  Administered 2015-11-06: 6 via RESPIRATORY_TRACT

## 2015-11-06 MED ORDER — AMLODIPINE BESYLATE 5 MG PO TABS
5.0000 mg | ORAL_TABLET | Freq: Every day | ORAL | Status: DC
  Filled 2015-11-06: qty 1

## 2015-11-06 MED ORDER — SUCCINYLCHOLINE CHLORIDE 20 MG/ML IJ SOLN
INTRAMUSCULAR | Status: DC | PRN
  Administered 2015-11-06: 60 mg via INTRAVENOUS

## 2015-11-06 MED ORDER — ALBUTEROL SULFATE (2.5 MG/3ML) 0.083% IN NEBU: 2.5000 mg | INHALATION_SOLUTION | Freq: Four times a day (QID) | RESPIRATORY_TRACT | Status: DC | PRN

## 2015-11-06 MED ORDER — HEPARIN SODIUM (PORCINE) 5000 UNIT/ML IJ SOLN
5000.0000 [IU] | Freq: Three times a day (TID) | INTRAMUSCULAR | Status: DC
  Administered 2015-11-06: 5000 [IU] via SUBCUTANEOUS
  Filled 2015-11-06 (×2): qty 1

## 2015-11-06 MED ORDER — GLYCOPYRROLATE 0.2 MG/ML IJ SOLN
INTRAMUSCULAR | Status: DC | PRN
  Administered 2015-11-06: 0.6 mg via INTRAVENOUS

## 2015-11-06 SURGICAL SUPPLY — 54 items
BLADE 10 SAFETY STRL DISP (BLADE) ×2 IMPLANT
BLADE ROTATE RAD 40 4 M4 (BLADE) IMPLANT
BLADE ROTATE TRICUT 4X13 M4 (BLADE) ×1 IMPLANT
BLADE SURG 15 STRL LF DISP TIS (BLADE) IMPLANT
BLADE SURG 15 STRL SS (BLADE) ×2
CANISTER SUCTION 2500CC (MISCELLANEOUS) ×2 IMPLANT
COAGULATOR SUCT SWTCH 10FR 6 (ELECTROSURGICAL) ×2 IMPLANT
CONT SPEC 4OZ CLIKSEAL STRL BL (MISCELLANEOUS) IMPLANT
CORDS BIPOLAR (ELECTRODE) ×1 IMPLANT
CRADLE DONUT ADULT HEAD (MISCELLANEOUS) ×1 IMPLANT
DRAPE PROXIMA HALF (DRAPES) IMPLANT
DRESSING TELFA 8X3 (GAUZE/BANDAGES/DRESSINGS) IMPLANT
DRSG NASOPORE 8CM (GAUZE/BANDAGES/DRESSINGS) IMPLANT
DURASEAL SPINE SEALANT 3ML (MISCELLANEOUS) IMPLANT
ELECT REM PT RETURN 9FT ADLT (ELECTROSURGICAL)
ELECTRODE REM PT RTRN 9FT ADLT (ELECTROSURGICAL) IMPLANT
FILTER ARTHROSCOPY CONVERTOR (FILTER) ×2 IMPLANT
FLOSEAL 10ML (HEMOSTASIS) IMPLANT
GLOVE BIOGEL PI IND STRL 6.5 (GLOVE) IMPLANT
GLOVE BIOGEL PI IND STRL 7.5 (GLOVE) IMPLANT
GLOVE BIOGEL PI INDICATOR 6.5 (GLOVE) ×1
GLOVE BIOGEL PI INDICATOR 7.5 (GLOVE) ×1
GLOVE ECLIPSE 7.5 STRL STRAW (GLOVE) ×2 IMPLANT
GLOVE SURG SS PI 6.5 STRL IVOR (GLOVE) ×1 IMPLANT
GOWN BRE IMP SLV SIRUS LXLNG (GOWN DISPOSABLE) ×2 IMPLANT
GOWN STRL REUS W/ TWL LRG LVL3 (GOWN DISPOSABLE) ×1 IMPLANT
GOWN STRL REUS W/TWL LRG LVL3 (GOWN DISPOSABLE) ×2
KIT BASIN OR (CUSTOM PROCEDURE TRAY) ×2 IMPLANT
KIT ROOM TURNOVER OR (KITS) ×2 IMPLANT
NDL HYPO 25GX1X1/2 BEV (NEEDLE) IMPLANT
NDL SPNL 25GX3.5 QUINCKE BL (NEEDLE) IMPLANT
NEEDLE HYPO 25GX1X1/2 BEV (NEEDLE) IMPLANT
NEEDLE SPNL 25GX3.5 QUINCKE BL (NEEDLE) IMPLANT
NS IRRIG 1000ML POUR BTL (IV SOLUTION) ×2 IMPLANT
PAD ARMBOARD 7.5X6 YLW CONV (MISCELLANEOUS) ×4 IMPLANT
PAD ENT ADHESIVE 25PK (MISCELLANEOUS) ×2 IMPLANT
PATTIES SURGICAL .5 X3 (DISPOSABLE) ×2 IMPLANT
PENCIL FOOT CONTROL (ELECTRODE) IMPLANT
SHEATH ENDOSCRUB 0 DEG (SHEATH) ×1 IMPLANT
SHEATH ENDOSCRUB 30 DEG (SHEATH) IMPLANT
SHEATH ENDOSCRUB 45 DEG (SHEATH) IMPLANT
SOLUTION ANTI FOG 6CC (MISCELLANEOUS) ×2 IMPLANT
SPECIMEN JAR SMALL (MISCELLANEOUS) ×4 IMPLANT
STRIP CLOSURE SKIN 1/2X4 (GAUZE/BANDAGES/DRESSINGS) IMPLANT
SUT ETHILON 3 0 PS 1 (SUTURE) IMPLANT
SYR 50ML SLIP (SYRINGE) IMPLANT
SYR CONTROL 10ML LL (SYRINGE) ×2 IMPLANT
TRACKER ENT INSTRUMENT (MISCELLANEOUS) ×2 IMPLANT
TRACKER ENT PATIENT (MISCELLANEOUS) ×2 IMPLANT
TRAY ENT MC OR (CUSTOM PROCEDURE TRAY) ×2 IMPLANT
TUBE CONNECTING 20X1/4 (TUBING) ×2 IMPLANT
TUBING STRAIGHTSHOT EPS 5PK (TUBING) ×2 IMPLANT
WATER STERILE IRR 1000ML POUR (IV SOLUTION) ×2 IMPLANT
WIPE INSTRUMENT VISIWIPE 73X73 (MISCELLANEOUS) ×2 IMPLANT

## 2015-11-06 NOTE — H&P (Signed)
[name] is an 80 y.o. female.   Chief Complaint: sinsutis HPI: hx of sinusitis and opacification on CT scan of frontal.   Past Medical History  Diagnosis Date  . ASTHMA 06/18/2007  . COLONIC POLYPS, HX OF 11/06/2009  . DYSPNEA ON EXERTION 05/06/2008  . GERD 06/18/2007  . HYPERCHOLESTEROLEMIA, PURE 07/09/2007  . HYPERTENSION 06/18/2007  . HYPOTHYROIDISM 06/18/2007  . NEPHROLITHIASIS, HX OF 08/07/2008  . OSTEOARTHRITIS 05/07/2008  . RENAL INSUFFICIENCY 05/06/2008  . Goiter   . Obesity   . Insomnia   . Renal cyst   . Depression   . Anxiety   . Cough     Past Surgical History  Procedure Laterality Date  . Abdominal hysterectomy    . Tonsillectomy    . Cataract extraction    . Knee arthroscopy    . Carpal tunnel release    . Total knee arthroplasty    . Lithotripsy    . Nasal sinus surgery  1990  . Joint replacement      Family History  Problem Relation Age of Onset  . Hypertension Neg Hx     family  . Cancer Neg Hx     colon ca , prostate ca  . Heart Problems Father   . Multiple myeloma Other   . Colon cancer Other    Social History:  reports that she quit smoking about 36 years ago. Her smoking use included Cigarettes. She has a 1.5 pack-year smoking history. She has never used smokeless tobacco. She reports that she drinks alcohol. She reports that she does not use illicit drugs.  Allergies: No Known Allergies  Medications Prior to Admission  Medication Sig Dispense Refill  . albuterol (PROVENTIL HFA;VENTOLIN HFA) 108 (90 BASE) MCG/ACT inhaler Inhale 2 puffs into the lungs every 6 (six) hours as needed for wheezing or shortness of breath.    [name] Kitchen amLODipine (NORVASC) 5 MG tablet Take 1 tablet (5 mg total) by mouth daily. 90 tablet 3  . benzonatate (TESSALON) 200 MG capsule TAKE 1 CAPSULE BY MOUTH THREE TIMES DAILY AS NEEDED FOR COUGH 20 capsule 0  . cetirizine (ZYRTEC) 10 MG tablet Take 10 mg by mouth daily.    . DULoxetine (CYMBALTA) 30 MG capsule Take 30 mg by mouth  daily.    [name] Kitchen esomeprazole (NEXIUM) 40 MG capsule Take 1 capsule (40 mg total) by mouth daily before breakfast. 90 capsule 3  . levothyroxine (SYNTHROID, LEVOTHROID) 100 MCG tablet Take 1 tablet (100 mcg total) by mouth daily. 90 tablet 1  . mometasone-formoterol (DULERA) 100-5 MCG/ACT AERO Inhale 2 puffs into the lungs 2 (two) times daily. 1 Inhaler 11  . montelukast (SINGULAIR) 10 MG tablet TAKE 1 BY MOUTH DAILY 90 tablet 1  . Multiple Vitamin (MULTIVITAMIN) tablet Take 1 tablet by mouth daily.    [name] Kitchen Respiratory Therapy Supplies (FLUTTER) DEVI Use as directed 1 each 0  . simvastatin (ZOCOR) 20 MG tablet TAKE 1 BY MOUTH AT BEDTIME 90 tablet 2  . traMADol (ULTRAM) 50 MG tablet Take 1 tablet (50 mg total) by mouth every 6 (six) hours as needed. (Patient taking differently: Take 50 mg by mouth every 6 (six) hours as needed for moderate pain. ) 90 tablet 1  . VESICARE 10 MG tablet TAKE 1 BY MOUTH DAILY 90 tablet 1  . vitamin C (ASCORBIC ACID) 500 MG tablet Take 500 mg by mouth daily.      No results found for this or any previous visit (from the past  48 hour(s)). No results found.  Review of Systems  Constitutional: Negative.   HENT: Negative.   Eyes: Negative.   Respiratory: Negative.   Cardiovascular: Negative.   Skin: Negative.   Neurological: Negative.     Blood pressure 162/95, pulse 86, temperature 97 F (36.1 C), temperature source Oral, resp. rate 16, SpO2 94 %. Physical Exam  Constitutional: She appears well-developed and well-nourished.  HENT:  Head: Normocephalic and atraumatic.  Mouth/Throat: Oropharynx is clear and moist.  Eyes: EOM are normal. Pupils are equal, round, and reactive to light.  Neck: Normal range of motion. Neck supple.  Cardiovascular: Normal rate.   Respiratory: Effort normal.  GI: Soft.  Musculoskeletal: Normal range of motion.     Assessment/Plan Chronic sinusitis- has complete obstruction of the right frontal and discussed procedure. Ready to  proceed.  Melissa Montane 11/06/2015, 10:17 AM

## 2015-11-06 NOTE — Op Note (Signed)
Preop/postop diagnosis: Chronic right nasofrontal mucocele Procedure: Endoscopic sinus surgery with ethmoid and frontal sinusotomy with fusion Anesthesia: Gen. Estimated blood loss: Less than 10 mL Indications: 80 year old with a large mucocele in the right frontal area that extends into the orbit on the right side. It does not appear to have any extension into the brain. It is completely opacified. The remaining sinuses have some thickening but they appear to be open from previous surgery. She was informed risks and benefits of the procedure and options were discussed all questions are answered and consent was obtained. Procedure: Patient was taken to the operating room placed in the supine position after general endotracheal tube anesthesia was placed in the supine position prepped and draped in usual sterile manner the fusion computer guidance system was positioned calibrated with excellent accuracy. The right side was injected with 1% lidocaine with 1 100,000 epinephrine in the middle turbinate. The bulging of the mucocele could easily be seen with endoscopy and it was opened using fusion guidance with the microdebrider. A mucus yellowish to white material was suctioned out of the cavity. The cavity with the 30 scope look clean and the nasofrontal duct was cannulated with a curved fusion guided suction. The orbital area was intact and had no evidence of orbital fat exposed. There was no CSF leak. The mucocele face was opened nicely and was much too large to justify her need a Rain stent. Pledgets were placed and there was excellent hemostasis. The remaining sinuses were opened with the maxillary and sphenoid easily visualized. She was then awakened brought to recovery room in stable condition and counts were correct

## 2015-11-06 NOTE — Anesthesia Procedure Notes (Signed)
Procedure Name: Intubation Date/Time: 11/06/2015 11:11 AM Performed by: Williemae Area B Pre-anesthesia Checklist: Patient identified, Emergency Drugs available, Suction available and Patient being monitored Patient Re-evaluated:Patient Re-evaluated prior to inductionOxygen Delivery Method: Circle system utilized Preoxygenation: Pre-oxygenation with 100% oxygen Intubation Type: IV induction Ventilation: Mask ventilation without difficulty Laryngoscope Size: Mac and 4 Grade View: Grade II Tube type: Oral Tube size: 7.5 mm Number of attempts: 1 Airway Equipment and Method: Stylet Placement Confirmation: ETT inserted through vocal cords under direct vision,  breath sounds checked- equal and bilateral and positive ETCO2 Secured at: 21 (cm at teeth) cm Tube secured with: Tape Dental Injury: Teeth and Oropharynx as per pre-operative assessment

## 2015-11-06 NOTE — Anesthesia Preprocedure Evaluation (Addendum)
Anesthesia Evaluation  Patient identified by MRN, date of birth, ID band Patient awake    Reviewed: Allergy & Precautions, NPO status , Patient's Chart, lab work & pertinent test results  History of Anesthesia Complications Negative for: history of anesthetic complications  Airway Mallampati: II  TM Distance: >3 FB Neck ROM: Full    Dental  (+) Teeth Intact, Missing, Dental Advisory Given   Pulmonary neg shortness of breath, asthma , neg sleep apnea, neg recent URI, former smoker, neg PE   breath sounds clear to auscultation       Cardiovascular hypertension, Pt. on medications  Rhythm:Regular     Neuro/Psych PSYCHIATRIC DISORDERS Anxiety Depression negative neurological ROS     GI/Hepatic GERD  Medicated and Controlled,  Endo/Other  Hypothyroidism Morbid obesity  Renal/GU Renal InsufficiencyRenal disease     Musculoskeletal  (+) Arthritis ,   Abdominal   Peds  Hematology negative hematology ROS (+)   Anesthesia Other Findings   Reproductive/Obstetrics                            Anesthesia Physical Anesthesia Plan  ASA: II  Anesthesia Plan: General   Post-op Pain Management:    Induction: Intravenous  Airway Management Planned: Oral ETT  Additional Equipment: None  Intra-op Plan:   Post-operative Plan: Extubation in OR  Informed Consent: I have reviewed the patients History and Physical, chart, labs and discussed the procedure including the risks, benefits and alternatives for the proposed anesthesia with the patient or authorized representative who has indicated his/her understanding and acceptance.   Dental advisory given  Plan Discussed with: CRNA and Surgeon  Anesthesia Plan Comments:         Anesthesia Quick Evaluation

## 2015-11-06 NOTE — Transfer of Care (Signed)
Immediate Anesthesia Transfer of Care Note  Patient: [name]  Procedure(s) Performed: Procedure(s) with comments: ENDOSCOPIC SINUS SURGERY WITH NAVIGATION (Right) - Endoscopic sinus surgery with Fusion protocol, right frontal and ethmoid sinusotomy   Patient Location: PACU  Anesthesia Type:General  Level of Consciousness: awake, alert , oriented and patient cooperative  Airway & Oxygen Therapy: Patient Spontanous Breathing and Patient connected to face mask oxygen  Post-op Assessment: Report given to RN, Post -op Vital signs reviewed and stable and Patient moving all extremities  Post vital signs: Reviewed and stable  Last Vitals:  Filed Vitals:   11/06/15 1201  BP: 142/70  Pulse: 92  Temp:   Resp: 13    Complications: No apparent anesthesia complications

## 2015-11-06 NOTE — Anesthesia Postprocedure Evaluation (Signed)
  Anesthesia Post-op Note  Patient: [name]  Procedure(s) Performed: Procedure(s) with comments: ENDOSCOPIC SINUS SURGERY WITH NAVIGATION (Right) - Endoscopic sinus surgery with Fusion protocol, right frontal and ethmoid sinusotomy   Patient Location: PACU  Anesthesia Type:General  Level of Consciousness: awake  Airway and Oxygen Therapy: Patient Spontanous Breathing  Post-op Pain: mild  Post-op Assessment: Post-op Vital signs reviewed, Patient's Cardiovascular Status Stable, Respiratory Function Stable, Patent Airway, No signs of Nausea or vomiting and Pain level controlled              Post-op Vital Signs: Reviewed and stable  Last Vitals:  Filed Vitals:   11/06/15 1300  BP: 154/71  Pulse: 89  Temp: 36.4 C  Resp: 20    Complications: No apparent anesthesia complications

## 2015-11-07 DIAGNOSIS — J341 Cyst and mucocele of nose and nasal sinus: Secondary | ICD-10-CM | POA: Diagnosis not present

## 2015-11-07 NOTE — Discharge Summary (Signed)
Physician Discharge Summary  Patient ID: [name] MRN: [id number] DOB/AGE: 1935-11-23 80 y.o.  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 She did well. She was admitted on a for observation and plan was to have her discharged later the yesterday evening but that did not occur secondary to communication. She is doing well and has done well overnight with no breathing problems, no bleeding, no eye swelling, and no pain. She is discharged to home to follow-up in one week.  Consults: None  Significant Diagnostic Studies: none  Treatments: surgery: right endoscopic sinus surgery  Discharge Exam: Blood pressure 153/78, pulse 89, temperature 97.9 F (36.6 C), temperature source Oral, resp. rate 18, height 5' 5.5" (1.664 m), weight 105.235 kg (232 lb), SpO2 92 %. awake and alert, nose is clear with no bleeding, eyes are normal with good vision and no swelling. Neck is without swelling. Cardiovascular is regular. Lungs are clear.abdomen soft Extremities no swelling or pain.  Disposition: 01-Home or Self Care  Discharge Instructions    Call MD for:  difficulty breathing, headache or visual disturbances    Complete by:  As directed      Call MD for:  extreme fatigue    Complete by:  As directed      Call MD for:  hives    Complete by:  As directed      Call MD for:  persistant dizziness or light-headedness    Complete by:  As directed      Call MD for:  persistant nausea and vomiting    Complete by:  As directed      Call MD for:  redness, tenderness, or signs of infection (pain, swelling, redness, odor or green/yellow discharge around incision site)    Complete by:  As directed      Call MD for:  severe uncontrolled pain    Complete by:   As directed      Call MD for:  temperature >100.4    Complete by:  As directed      Diet - low sodium heart healthy    Complete by:  As directed      Discharge instructions    Complete by:  As directed   Follow-up in one week. Call if any problems or issues such as any breathing difficulties or any vision or eye swelling. Any other problems that are not consistent with normal call as well. Resume your normal medications. Perform saline irrigation to the right side of the nose twice a day.     Increase activity slowly    Complete by:  As directed             Medication List    TAKE these medications        albuterol 108 (90 BASE) MCG/ACT inhaler  Commonly known as:  PROVENTIL HFA;VENTOLIN HFA  Inhale 2 puffs into the lungs every 6 (six) hours as needed for wheezing or shortness of breath.     amLODipine 5 MG tablet  Commonly known as:  NORVASC  Take 1 tablet (5 mg total) by mouth daily.     benzonatate 200 MG capsule  Commonly known as:  TESSALON  TAKE 1 CAPSULE BY MOUTH THREE TIMES DAILY AS NEEDED FOR COUGH     cetirizine 10 MG tablet  Commonly known as:  ZYRTEC  Take 10 mg by mouth daily.     DULoxetine 30 MG capsule  Commonly known as:  CYMBALTA  Take 30 mg by mouth daily.     esomeprazole 40 MG capsule  Commonly known as:  NEXIUM  Take 1 capsule (40 mg total) by mouth daily before breakfast.     FLUTTER Devi  Use as directed     levothyroxine 100 MCG tablet  Commonly known as:  SYNTHROID, LEVOTHROID  Take 1 tablet (100 mcg total) by mouth daily.     mometasone-formoterol 100-5 MCG/ACT Aero  Commonly known as:  DULERA  Inhale 2 puffs into the lungs 2 (two) times daily.     montelukast 10 MG tablet  Commonly known as:  SINGULAIR  TAKE 1 BY MOUTH DAILY     multivitamin tablet  Take 1 tablet by mouth daily.     simvastatin 20 MG tablet  Commonly known as:  ZOCOR  TAKE 1 BY MOUTH AT BEDTIME     traMADol 50 MG tablet  Commonly known as:  ULTRAM  Take 1  tablet (50 mg total) by mouth every 6 (six) hours as needed.     VESICARE 10 MG tablet  Generic drug:  solifenacin  TAKE 1 BY MOUTH DAILY     vitamin C 500 MG tablet  Commonly known as:  ASCORBIC ACID  Take 500 mg by mouth daily.         SignedMelissa Montane 11/07/2015, 5:33 AM

## 2015-11-07 NOTE — Progress Notes (Signed)
1 Day Post-Op  Subjective: She is doing well. Has no complaints or problems. Taking fluids well and eating. No bleeding. No pain. No breathing difficulties.  Objective: Vital signs in last 24 hours: Temp:  [97 F (36.1 C)-98.1 F (36.7 C)] 97.9 F (36.6 C) (11/19 0501) Pulse Rate:  [79-92] 89 (11/19 0501) Resp:  [13-29] 18 (11/19 0501) BP: (133-162)/(60-95) 153/78 mmHg (11/19 0501) SpO2:  [92 %-100 %] 92 % (11/19 0501) Weight:  [105.235 kg (232 lb)] 105.235 kg (232 lb) (11/18 1600) Last BM Date: 11/04/15  Intake/Output from previous day: 11/18 0701 - 11/19 0700 In: 2634 [P.O.:608; I.V.:2026] Out: -  Intake/Output this shift: Total I/O In: 871 [P.O.:118; I.V.:753] Out: -   awake and alert. No bleeding. Vision is intact and no swelling of the eye.  Lab Results:   Recent Labs  11/06/15 1400  WBC 5.6  HGB 12.2  HCT 37.5  PLT 213   BMET  Recent Labs  11/06/15 1400  CREATININE 1.71*   PT/INR No results for input(s): LABPROT, INR in the last 72 hours. ABG No results for input(s): PHART, HCO3 in the last 72 hours.  Invalid input(s): PCO2, PO2  Studies/Results: No results found.  Anti-infectives: Anti-infectives    None      Assessment/Plan: s/p Procedure(s) with comments: ENDOSCOPIC SINUS SURGERY WITH NAVIGATION (Right) - Endoscopic sinus surgery with Fusion protocol, right frontal and ethmoid sinusotomy  she stayed overnight simply because of her medical problems and she's had no issues. Her breathing has been excellent. She has no  pain and there has not been any bleeding. There is no eye swelling. She is to be discharged to home.     Melissa Montane 11/07/2015

## 2015-11-09 ENCOUNTER — Encounter (HOSPITAL_COMMUNITY): Payer: Self-pay | Admitting: Otolaryngology

## 2015-11-17 ENCOUNTER — Other Ambulatory Visit: Payer: Self-pay | Admitting: Internal Medicine

## 2015-11-25 ENCOUNTER — Other Ambulatory Visit: Payer: Self-pay | Admitting: Internal Medicine

## 2015-11-25 MED ORDER — LEVOTHYROXINE SODIUM 100 MCG PO TABS: 100.0000 ug | ORAL_TABLET | Freq: Every day | ORAL | Status: DC

## 2015-12-01 ENCOUNTER — Other Ambulatory Visit: Payer: Self-pay | Admitting: Internal Medicine

## 2015-12-30 ENCOUNTER — Other Ambulatory Visit: Payer: Medicare Other

## 2016-01-07 ENCOUNTER — Encounter: Payer: Medicare Other | Admitting: Internal Medicine

## 2016-01-19 ENCOUNTER — Other Ambulatory Visit (INDEPENDENT_AMBULATORY_CARE_PROVIDER_SITE_OTHER): Payer: Medicare Other

## 2016-01-19 ENCOUNTER — Other Ambulatory Visit: Payer: Medicare Other

## 2016-01-19 DIAGNOSIS — E7800 Pure hypercholesterolemia, unspecified: Secondary | ICD-10-CM

## 2016-01-19 DIAGNOSIS — Z0000 Encounter for general adult medical examination without abnormal findings: Secondary | ICD-10-CM

## 2016-01-19 LAB — HEPATIC FUNCTION PANEL
ALT: 13 U/L (ref 0–35)
AST: 18 U/L (ref 0–37)
Albumin: 3.6 g/dL (ref 3.5–5.2)
Alkaline Phosphatase: 133 U/L — ABNORMAL HIGH (ref 39–117)
BILIRUBIN DIRECT: 0.1 mg/dL (ref 0.0–0.3)
TOTAL PROTEIN: 6.1 g/dL (ref 6.0–8.3)
Total Bilirubin: 0.7 mg/dL (ref 0.2–1.2)

## 2016-01-19 LAB — CBC WITH DIFFERENTIAL/PLATELET
BASOS PCT: 0.4 % (ref 0.0–3.0)
Basophils Absolute: 0 10*3/uL (ref 0.0–0.1)
EOS ABS: 0.3 10*3/uL (ref 0.0–0.7)
Eosinophils Relative: 4 % (ref 0.0–5.0)
HCT: 41.6 % (ref 36.0–46.0)
HEMOGLOBIN: 13.5 g/dL (ref 12.0–15.0)
LYMPHS ABS: 1.8 10*3/uL (ref 0.7–4.0)
Lymphocytes Relative: 28 % (ref 12.0–46.0)
MCHC: 32.5 g/dL (ref 30.0–36.0)
MCV: 86.8 fl (ref 78.0–100.0)
MONO ABS: 0.4 10*3/uL (ref 0.1–1.0)
Monocytes Relative: 6.4 % (ref 3.0–12.0)
NEUTROS PCT: 61.2 % (ref 43.0–77.0)
Neutro Abs: 3.9 10*3/uL (ref 1.4–7.7)
Platelets: 216 10*3/uL (ref 150.0–400.0)
RBC: 4.8 Mil/uL (ref 3.87–5.11)
RDW: 15.4 % (ref 11.5–15.5)
WBC: 6.4 10*3/uL (ref 4.0–10.5)

## 2016-01-19 LAB — BASIC METABOLIC PANEL
BUN: 18 mg/dL (ref 6–23)
CO2: 30 mEq/L (ref 19–32)
CREATININE: 1.5 mg/dL — AB (ref 0.40–1.20)
Calcium: 10.2 mg/dL (ref 8.4–10.5)
Chloride: 104 mEq/L (ref 96–112)
GFR: 35.67 mL/min — AB (ref >60.00)
Glucose, Bld: 87 mg/dL (ref 70–99)
Potassium: 4.5 mEq/L (ref 3.5–5.1)
Sodium: 141 mEq/L (ref 135–145)

## 2016-01-19 LAB — LIPID PANEL
CHOLESTEROL: 143 mg/dL (ref 0–200)
HDL: 52.1 mg/dL (ref >39.00)
LDL CALC: 60 mg/dL (ref 0–99)
NonHDL: 90.83
TRIGLYCERIDES: 154 mg/dL — AB (ref 0.0–149.0)
Total CHOL/HDL Ratio: 3
VLDL: 30.8 mg/dL (ref 0.0–40.0)

## 2016-01-19 LAB — TSH: TSH: 2.48 u[IU]/mL (ref 0.35–4.50)

## 2016-01-21 LAB — POC URINALSYSI DIPSTICK (AUTOMATED)
Bilirubin, UA: NEGATIVE
GLUCOSE UA: NEGATIVE
Ketones, UA: NEGATIVE
Nitrite, UA: NEGATIVE
Protein, UA: NEGATIVE
SPEC GRAV UA: 1.015
UROBILINOGEN UA: 0.2
pH, UA: 7

## 2016-01-23 LAB — URINE CULTURE

## 2016-01-26 ENCOUNTER — Ambulatory Visit (INDEPENDENT_AMBULATORY_CARE_PROVIDER_SITE_OTHER): Payer: Medicare Other | Admitting: Internal Medicine

## 2016-01-26 ENCOUNTER — Encounter: Payer: Self-pay | Admitting: Internal Medicine

## 2016-01-26 VITALS — BP 108/70 | HR 97 | Temp 98.7°F | Resp 20 | Ht 63.75 in | Wt 230.0 lb

## 2016-01-26 DIAGNOSIS — J4520 Mild intermittent asthma, uncomplicated: Secondary | ICD-10-CM | POA: Diagnosis not present

## 2016-01-26 DIAGNOSIS — E039 Hypothyroidism, unspecified: Secondary | ICD-10-CM | POA: Diagnosis not present

## 2016-01-26 DIAGNOSIS — M159 Polyosteoarthritis, unspecified: Secondary | ICD-10-CM

## 2016-01-26 DIAGNOSIS — I10 Essential (primary) hypertension: Secondary | ICD-10-CM

## 2016-01-26 DIAGNOSIS — Z0000 Encounter for general adult medical examination without abnormal findings: Secondary | ICD-10-CM

## 2016-01-26 DIAGNOSIS — Z86010 Personal history of colonic polyps: Secondary | ICD-10-CM | POA: Diagnosis not present

## 2016-01-26 DIAGNOSIS — M150 Primary generalized (osteo)arthritis: Secondary | ICD-10-CM

## 2016-01-26 MED ORDER — MONTELUKAST SODIUM 10 MG PO TABS: ORAL_TABLET | ORAL | Status: DC

## 2016-01-26 MED ORDER — TRAMADOL HCL 50 MG PO TABS: 50.0000 mg | ORAL_TABLET | Freq: Four times a day (QID) | ORAL | Status: DC | PRN

## 2016-01-26 MED ORDER — SIMVASTATIN 20 MG PO TABS: ORAL_TABLET | ORAL | Status: DC

## 2016-01-26 MED ORDER — AMLODIPINE BESYLATE 5 MG PO TABS: 5.0000 mg | ORAL_TABLET | Freq: Every day | ORAL | Status: DC

## 2016-01-26 MED ORDER — BENZONATATE 200 MG PO CAPS: ORAL_CAPSULE | ORAL | Status: DC

## 2016-01-26 MED ORDER — DULOXETINE HCL 30 MG PO CPEP: 30.0000 mg | ORAL_CAPSULE | Freq: Every day | ORAL | Status: DC

## 2016-01-26 MED ORDER — SOLIFENACIN SUCCINATE 10 MG PO TABS: ORAL_TABLET | ORAL | Status: DC

## 2016-01-26 MED ORDER — LEVOTHYROXINE SODIUM 100 MCG PO TABS: 100.0000 ug | ORAL_TABLET | Freq: Every day | ORAL | Status: DC

## 2016-01-26 NOTE — Patient Instructions (Addendum)
Limit your sodium (Salt) intake  Please check your blood pressure on a regular basis.  If it is consistently greater than 150/90, please make an office appointment.    It is important that you exercise regularly, at least 20 minutes 3 to 4 times per week.  If you develop chest pain or shortness of breath seek  medical attention.  You need to lose weight.  Consider a lower calorie diet and regular exercise.  Return in 6 months for follow-up Menopause is a normal process in which your reproductive ability comes to an end. This process happens gradually over a span of months to years, usually between the ages of 46 and 56. Menopause is complete when you have missed 12 consecutive menstrual periods. It is important to talk with your health care provider about some of the most common conditions that affect postmenopausal women, such as heart disease, cancer, and bone loss (osteoporosis). Adopting a healthy lifestyle and getting preventive care can help to promote your health and wellness. Those actions can also lower your chances of developing some of these common conditions. WHAT SHOULD I KNOW ABOUT MENOPAUSE? During menopause, you may experience a number of symptoms, such as:  Moderate-to-severe hot flashes.  Night sweats.  Decrease in sex drive.  Mood swings.  Headaches.  Tiredness.  Irritability.  Memory problems.  Insomnia. Choosing to treat or not to treat menopausal changes is an individual decision that you make with your health care provider. WHAT SHOULD I KNOW ABOUT HORMONE REPLACEMENT THERAPY AND SUPPLEMENTS? Hormone therapy products are effective for treating symptoms that are associated with menopause, such as hot flashes and night sweats. Hormone replacement carries certain risks, especially as you become older. If you are thinking about using estrogen or estrogen with progestin treatments, discuss the benefits and risks with your health care provider. WHAT SHOULD I KNOW  ABOUT HEART DISEASE AND STROKE? Heart disease, heart attack, and stroke become more likely as you age. This may be due, in part, to the hormonal changes that your body experiences during menopause. These can affect how your body processes dietary fats, triglycerides, and cholesterol. Heart attack and stroke are both medical emergencies. There are many things that you can do to help prevent heart disease and stroke:  Have your blood pressure checked at least every 1-2 years. High blood pressure causes heart disease and increases the risk of stroke.  If you are 50-53 years old, ask your health care provider if you should take aspirin to prevent a heart attack or a stroke.  Do not use any tobacco products, including cigarettes, chewing tobacco, or electronic cigarettes. If you need help quitting, ask your health care provider.  It is important to eat a healthy diet and maintain a healthy weight.  Be sure to include plenty of vegetables, fruits, low-fat dairy products, and lean protein.  Avoid eating foods that are high in solid fats, added sugars, or salt (sodium).  Get regular exercise. This is one of the most important things that you can do for your health.  Try to exercise for at least 150 minutes each week. The type of exercise that you do should increase your heart rate and make you sweat. This is known as moderate-intensity exercise.  Try to do strengthening exercises at least twice each week. Do these in addition to the moderate-intensity exercise.  Know your numbers.Ask your health care provider to check your cholesterol and your blood glucose. Continue to have your blood tested as directed by your  health care provider. WHAT SHOULD I KNOW ABOUT CANCER SCREENING? There are several types of cancer. Take the following steps to reduce your risk and to catch any cancer development as early as possible. Breast Cancer  Practice breast self-awareness.  This means understanding how your  breasts normally appear and feel.  It also means doing regular breast self-exams. Let your health care provider know about any changes, no matter how small.  If you are 34 or older, have a clinician do a breast exam (clinical breast exam or CBE) every year. Depending on your age, family history, and medical history, it may be recommended that you also have a yearly breast X-ray (mammogram).  If you have a family history of breast cancer, talk with your health care provider about genetic screening.  If you are at high risk for breast cancer, talk with your health care provider about having an MRI and a mammogram every year.  Breast cancer (BRCA) gene test is recommended for women who have family members with BRCA-related cancers. Results of the assessment will determine the need for genetic counseling and BRCA1 and for BRCA2 testing. BRCA-related cancers include these types:  Breast. This occurs in males or females.  Ovarian.  Tubal. This may also be called fallopian tube cancer.  Cancer of the abdominal or pelvic lining (peritoneal cancer).  Prostate.  Pancreatic. Cervical, Uterine, and Ovarian Cancer Your health care provider may recommend that you be screened regularly for cancer of the pelvic organs. These include your ovaries, uterus, and vagina. This screening involves a pelvic exam, which includes checking for microscopic changes to the surface of your cervix (Pap test).  For women ages 21-65, health care providers may recommend a pelvic exam and a Pap test every three years. For women ages 99-65, they may recommend the Pap test and pelvic exam, combined with testing for human papilloma virus (HPV), every five years. Some types of HPV increase your risk of cervical cancer. Testing for HPV may also be done on women of any age who have unclear Pap test results.  Other health care providers may not recommend any screening for nonpregnant women who are considered low risk for pelvic  cancer and have no symptoms. Ask your health care provider if a screening pelvic exam is right for you.  If you have had past treatment for cervical cancer or a condition that could lead to cancer, you need Pap tests and screening for cancer for at least 20 years after your treatment. If Pap tests have been discontinued for you, your risk factors (such as having a new sexual partner) need to be reassessed to determine if you should start having screenings again. Some women have medical problems that increase the chance of getting cervical cancer. In these cases, your health care provider may recommend that you have screening and Pap tests more often.  If you have a family history of uterine cancer or ovarian cancer, talk with your health care provider about genetic screening.  If you have vaginal bleeding after reaching menopause, tell your health care provider.  There are currently no reliable tests available to screen for ovarian cancer. Lung Cancer Lung cancer screening is recommended for adults 26-64 years old who are at high risk for lung cancer because of a history of smoking. A yearly low-dose CT scan of the lungs is recommended if you:  Currently smoke.  Have a history of at least 30 pack-years of smoking and you currently smoke or have quit within the  past 15 years. A pack-year is smoking an average of one pack of cigarettes per day for one year. Yearly screening should:  Continue until it has been 15 years since you quit.  Stop if you develop a health problem that would prevent you from having lung cancer treatment. Colorectal Cancer  This type of cancer can be detected and can often be prevented.  Routine colorectal cancer screening usually begins at age 61 and continues through age 3.  If you have risk factors for colon cancer, your health care provider may recommend that you be screened at an earlier age.  If you have a family history of colorectal cancer, talk with your  health care provider about genetic screening.  Your health care provider may also recommend using home test kits to check for hidden blood in your stool.  A small camera at the end of a tube can be used to examine your colon directly (sigmoidoscopy or colonoscopy). This is done to check for the earliest forms of colorectal cancer.  Direct examination of the colon should be repeated every 5-10 years until age 47. However, if early forms of precancerous polyps or small growths are found or if you have a family history or genetic risk for colorectal cancer, you may need to be screened more often. Skin Cancer  Check your skin from head to toe regularly.  Monitor any moles. Be sure to tell your health care provider:  About any new moles or changes in moles, especially if there is a change in a mole's shape or color.  If you have a mole that is larger than the size of a pencil eraser.  If any of your family members has a history of skin cancer, especially at a young age, talk with your health care provider about genetic screening.  Always use sunscreen. Apply sunscreen liberally and repeatedly throughout the day.  Whenever you are outside, protect yourself by wearing long sleeves, pants, a wide-brimmed hat, and sunglasses. WHAT SHOULD I KNOW ABOUT OSTEOPOROSIS? Osteoporosis is a condition in which bone destruction happens more quickly than new bone creation. After menopause, you may be at an increased risk for osteoporosis. To help prevent osteoporosis or the bone fractures that can happen because of osteoporosis, the following is recommended:  If you are 66-71 years old, get at least 1,000 mg of calcium and at least 600 mg of vitamin D per day.  If you are older than age 11 but younger than age 93, get at least 1,200 mg of calcium and at least 600 mg of vitamin D per day.  If you are older than age 97, get at least 1,200 mg of calcium and at least 800 mg of vitamin D per day. Smoking and  excessive alcohol intake increase the risk of osteoporosis. Eat foods that are rich in calcium and vitamin D, and do weight-bearing exercises several times each week as directed by your health care provider. WHAT SHOULD I KNOW ABOUT HOW MENOPAUSE AFFECTS [place]? Depression may occur at any age, but it is more common as you become older. Common symptoms of depression include:  Low or sad mood.  Changes in sleep patterns.  Changes in appetite or eating patterns.  Feeling an overall lack of motivation or enjoyment of activities that you previously enjoyed.  Frequent crying spells. Talk with your health care provider if you think that you are experiencing depression. WHAT SHOULD I KNOW ABOUT IMMUNIZATIONS? It is important that you get and maintain  your immunizations. These include:  Tetanus, diphtheria, and pertussis (Tdap) booster vaccine.  Influenza every year before the flu season begins.  Pneumonia vaccine.  Shingles vaccine. Your health care provider may also recommend other immunizations.   This information is not intended to replace advice given to you by your health care provider. Make sure you discuss any questions you have with your health care provider.   Document Released: 01/27/2006 Document Revised: 12/26/2014 Document Reviewed: 08/07/2014 [employer]vier Interactive Patient Education [employer].

## 2016-01-26 NOTE — Progress Notes (Signed)
Pre visit review using our clinic review tool, if applicable. No additional management support is needed unless otherwise documented below in the visit note. 

## 2016-01-26 NOTE — Progress Notes (Signed)
Patient ID: [name], female   DOB: 02-05-1935, 81 y.o.   MRN: [id number]  Subjective:    Patient ID: [name], female    DOB: 07-02-1935, 81 y.o.   MRN: [id number]  Hypertension Pertinent negatives include no chest pain, headaches, palpitations or shortness of breath.    81  year-old patient who is seen today for a preventative health examination.   She is followed by renal medicine due to chronic kidney disease ; she has treated hypertension asthma  and dyslipidemia. She also has a history of colonic polyps. Due for followup colonoscopy in 2018.  Follow-up colonoscopy 2015 revealed 5 polyps  Social history-  considerable situational stress due to the poor health of her husband  Allergies (verified):  No Known Drug Allergies   Past History:  Past Medical History:   Asthma  GERD  Hypertension  Hypothyroidism  Goiter  Benign tumor  obesity  insomnia  chronic kidney disease  Osteoarthritis  Nephrolithiasis, hx of  renal cysts  Colonic polyps, hx of   Past Surgical History:  Colonoscopy-10/14/2004  Hysterectomy with bladder tack, age 36  Tonsillectomy  Bilat Cataracts  Knee scope Percell Miller)  history carpal tunnel release  status post lithotripsy, fall 2009  s/p R TKR 03-2010  colonoscopy October 2005, Oct 2010   Family History:   father died age 82 MI. History prostate cancer  mother died age 28  one brother h/o of a stroke at age 88 due to a cerebral. aneurysm  one sister is well;  family history positive for hypertension, colon cancer, prostate cancer   Social History:   Married  Drug use-no  Never Smoked  Regular exercise-yes   1. Risk factors, based on past  M,S,F history- cardiovascular risk factors include hypertension and dyslipidemia 2.  Physical activities: Fairly sedentary due to obesity and arthritis no exercise limitations  3.  Depression/mood: No history of depression or mood disorder  4.  Hearing: No deficits  5.  ADL's: Independent in  all aspects of daily living  6.  Fall risk: Moderate due to 2 obesity  7.  Home safety: No problems identified  8.  Height weight, and visual acuity; height and weight stable weight is down modestly. No change in visual acuity  9.  Counseling: Further weight loss exercise all recommended heart healthy diet encouraged  10. Lab orders based on risk factors: Laboratory profile reviewed creatinine 1.9  and stable  11. Referral : Followup renal medicine. Will need colonoscopy in 2018  12. Care plan: Annual mammograms encouraged  13. Cognitive assessment: Alert and oriented normal affect. No cognitive dysfunction  14.  Preventive services will include the [facility] mammograms as well as periodic colonoscopies.  She will continue to be seen by renal medicine at least annually in by primary care annually. Patient was provided with a written and personalized care plan  15.  Provider list includes, ophthalmology primary care renal medicine and GI     Review of Systems  Constitutional: Negative for fever, appetite change, fatigue and unexpected weight change.  HENT: Negative for congestion, dental problem, ear pain, hearing loss, mouth sores, nosebleeds, sinus pressure, sore throat, tinnitus, trouble swallowing and voice change.   Eyes: Negative for photophobia, pain, redness and visual disturbance.  Respiratory: Negative for cough, chest tightness and shortness of breath.   Cardiovascular: Negative for chest pain, palpitations and leg swelling.  Gastrointestinal: Negative for nausea, vomiting, abdominal pain, diarrhea, constipation, blood in stool, abdominal distention and rectal pain.  Genitourinary: Negative for dysuria, urgency, frequency, hematuria, flank pain, vaginal bleeding, vaginal discharge, difficulty urinating, genital sores, vaginal pain, menstrual problem and pelvic pain.  Musculoskeletal: Positive for back pain. Negative for arthralgias and neck stiffness.  Skin: Negative for  rash.  Neurological: Negative for dizziness, syncope, speech difficulty, weakness, light-headedness, numbness and headaches.  Hematological: Negative for adenopathy. Does not bruise/bleed easily.  Psychiatric/Behavioral: Negative for suicidal ideas, behavioral problems, self-injury, dysphoric mood and agitation. The patient is not nervous/anxious.        Objective:   Physical Exam  Constitutional: She is oriented to person, place, and time. She appears well-developed and well-nourished.  Blood pressure 120/70 left arm   HENT:  Head: Normocephalic and atraumatic.  Right Ear: External ear normal.  Left Ear: External ear normal.  Mouth/Throat: Oropharynx is clear and moist.  Eyes: Conjunctivae and EOM are normal.  Neck: Normal range of motion. Neck supple. No JVD present. No thyromegaly present.  Thyroidectomy scar  Cardiovascular: Normal rate, regular rhythm, normal heart sounds and intact distal pulses.   No murmur heard. Dorsalis pedis pulses not easily palpable  Pulmonary/Chest: Effort normal and breath sounds normal. She has no wheezes. She has no rales.  Abdominal: Soft. Bowel sounds are normal. She exhibits no distension and no mass. There is no tenderness. There is no rebound and no guarding.  Musculoskeletal: Normal range of motion. She exhibits no edema or tenderness.  Neurological: She is alert and oriented to person, place, and time. She has normal reflexes. No cranial nerve deficit. She exhibits normal muscle tone. Coordination normal.  Skin: Skin is warm and dry. No rash noted.  Status post bilateral total knee replacement surgery scars  Psychiatric: She has a normal mood and affect. Her behavior is normal.          Assessment & Plan:   Exogenous obesity Preventive health examination Hypertension well controlled Dyslipidemia Hypothyroidism.  TSH therapeutic Colonic polyps.  Follow-up colonoscopy in 2 years  Medical regimen unchanged. We'll recheck in 6  months Low salt diet regular exercise weight loss all encouraged

## 2016-05-13 IMAGING — DX DG CHEST 1V PORT
1 series · 1 of 1 positions shown · non-contrast
Comparison: 10/26/2012.

CLINICAL DATA: Shortness of breath and dizziness. Hypotension.
History of asthma. Cough and congestion with wheezing.

EXAM:
PORTABLE CHEST - 1 VIEW

[chest ap]
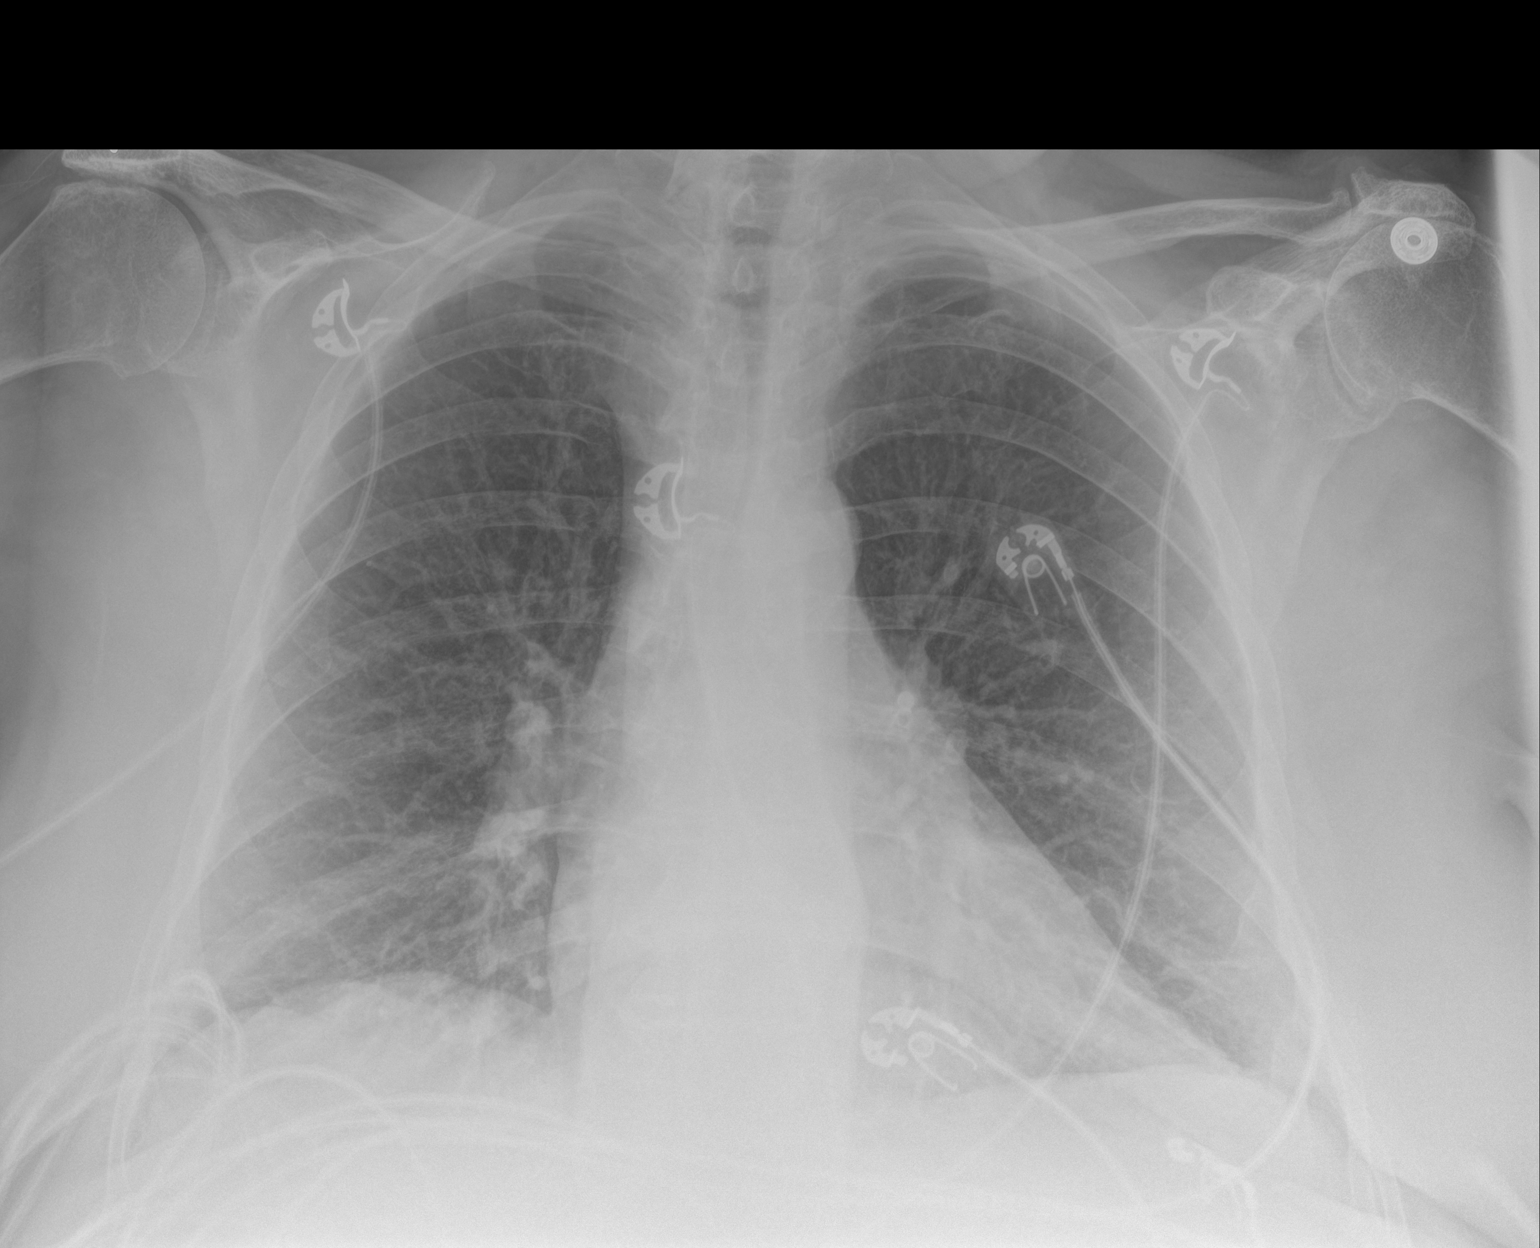

[1 of 1 positions shown; findings below may reference images not displayed]

FINDINGS: Cardiomediastinal silhouette within normal limits for the degree of
inspiration. Mild basilar atelectasis without focal opacity,
effusion, or pneumothorax. No acute osseous findings. Degenerative
change LEFT AC joint.
IMPRESSION: No overt infiltrates or failure. Low lung volumes compared with
prior radiograph.

## 2016-07-08 ENCOUNTER — Other Ambulatory Visit: Payer: Self-pay | Admitting: Internal Medicine

## 2016-07-08 DIAGNOSIS — Z1231 Encounter for screening mammogram for malignant neoplasm of breast: Secondary | ICD-10-CM

## 2016-07-21 ENCOUNTER — Other Ambulatory Visit: Payer: Self-pay | Admitting: Internal Medicine

## 2016-07-21 NOTE — Telephone Encounter (Signed)
Refill request

## 2016-07-22 NOTE — Telephone Encounter (Signed)
Okay for refill?  

## 2016-08-10 ENCOUNTER — Ambulatory Visit: Payer: Medicare Other

## 2016-08-10 LAB — HM MAMMOGRAPHY

## 2016-08-24 ENCOUNTER — Encounter: Payer: Self-pay | Admitting: Internal Medicine

## 2016-09-08 ENCOUNTER — Ambulatory Visit (INDEPENDENT_AMBULATORY_CARE_PROVIDER_SITE_OTHER): Payer: Medicare Other

## 2016-09-08 DIAGNOSIS — Z23 Encounter for immunization: Secondary | ICD-10-CM | POA: Diagnosis not present

## 2016-12-16 ENCOUNTER — Other Ambulatory Visit: Payer: Self-pay | Admitting: Physician Assistant

## 2016-12-16 ENCOUNTER — Ambulatory Visit (INDEPENDENT_AMBULATORY_CARE_PROVIDER_SITE_OTHER): Payer: BLUE CROSS/BLUE SHIELD | Admitting: Physician Assistant

## 2016-12-16 ENCOUNTER — Ambulatory Visit (INDEPENDENT_AMBULATORY_CARE_PROVIDER_SITE_OTHER): Payer: BLUE CROSS/BLUE SHIELD

## 2016-12-16 VITALS — BP 130/76 | HR 105 | Temp 98.6°F | Resp 18 | Ht 63.75 in | Wt 250.0 lb

## 2016-12-16 DIAGNOSIS — R059 Cough, unspecified: Secondary | ICD-10-CM

## 2016-12-16 DIAGNOSIS — R062 Wheezing: Secondary | ICD-10-CM | POA: Diagnosis not present

## 2016-12-16 DIAGNOSIS — R05 Cough: Secondary | ICD-10-CM | POA: Diagnosis not present

## 2016-12-16 DIAGNOSIS — R0981 Nasal congestion: Secondary | ICD-10-CM | POA: Diagnosis not present

## 2016-12-16 LAB — POCT CBC
GRANULOCYTE PERCENT: 77.5 % (ref 37–80)
HCT, POC: 36 % — AB (ref 37.7–47.9)
HEMOGLOBIN: 12.4 g/dL (ref 12.2–16.2)
Lymph, poc: 1.7 (ref 0.6–3.4)
MCH: 29.3 pg (ref 27–31.2)
MCHC: 34.6 g/dL (ref 31.8–35.4)
MCV: 84.8 fL (ref 80–97)
MID (CBC): 0.6 (ref 0–0.9)
MPV: 7.1 fL (ref 0–99.8)
POC GRANULOCYTE: 7.8 — AB (ref 2–6.9)
POC LYMPH PERCENT: 16.5 %L (ref 10–50)
POC MID %: 6 % (ref 0–12)
Platelet Count, POC: 228 10*3/uL (ref 142–424)
RBC: 4.24 M/uL (ref 4.04–5.48)
RDW, POC: 15.6 %
WBC: 10.1 10*3/uL (ref 4.6–10.2)

## 2016-12-16 MED ORDER — ALBUTEROL SULFATE (2.5 MG/3ML) 0.083% IN NEBU
2.5000 mg | INHALATION_SOLUTION | Freq: Once | RESPIRATORY_TRACT | Status: AC
  Administered 2016-12-16: 2.5 mg via RESPIRATORY_TRACT

## 2016-12-16 MED ORDER — AZITHROMYCIN 250 MG PO TABS: ORAL_TABLET | ORAL | 0 refills | Status: DC

## 2016-12-16 MED ORDER — IPRATROPIUM BROMIDE 0.02 % IN SOLN
0.5000 mg | Freq: Once | RESPIRATORY_TRACT | Status: AC
  Administered 2016-12-16: 0.5 mg via RESPIRATORY_TRACT

## 2016-12-16 MED ORDER — FLUTICASONE PROPIONATE 50 MCG/ACT NA SUSP: 2.0000 | Freq: Every day | NASAL | 0 refills | Status: DC

## 2016-12-16 NOTE — Progress Notes (Signed)
MRN: [id number] DOB: May 09, 1935  Subjective:   [name] is a 81 y.o. female presenting for chief complaint of URI (cough x 1 week , green and brown nasal mucus, wheezing) .  Reports 3 week history of sinus congestion, cough with intermittent wheezing,  and  rhinorrhea. Notes it is getting progressively worse. She is blowing out brown/green mucous from her nose now and has hoarse voice. Has tried tessalon perles, albuterol, dulera inhaler, singulair with mild relief. Denies fever, ear pain, sore throat, shortness of breath and chest pain, night sweats, chills, nausea, vomiting, abdominal pain and diarrhea. Has not had sick contact with anyone. Has history of seasonal allergies and asthma. Patient has hadflu shot this season. Has history of social smoking in college.  Has history of pneumonia 3 years ago. Denies any other aggravating or relieving factors, no other questions or concerns.  [name] has a current medication list which includes the following prescription(s): albuterol, amlodipine, benzonatate, cetirizine, duloxetine, esomeprazole, furosemide, levothyroxine, montelukast, multivitamin, flutter, simvastatin, solifenacin, tramadol, vitamin c, and mometasone-formoterol, and the following Facility-Administered Medications: albuterol. Also has No Known Allergies.  [name]  has a past medical history of Anxiety; ASTHMA (06/18/2007); COLONIC POLYPS, HX OF (11/06/2009); Cough; Depression; DYSPNEA ON EXERTION (05/06/2008); GERD (06/18/2007); Goiter; HYPERCHOLESTEROLEMIA, PURE (07/09/2007); HYPERTENSION (06/18/2007); HYPOTHYROIDISM (06/18/2007); Insomnia; NEPHROLITHIASIS, HX OF (08/07/2008); Obesity; OSTEOARTHRITIS (05/07/2008); Renal cyst; and RENAL INSUFFICIENCY (05/06/2008). Also  has a past surgical history that includes Abdominal hysterectomy; Tonsillectomy; Cataract extraction; Knee arthroscopy; Carpal tunnel release; Total knee arthroplasty; Lithotripsy; Nasal sinus surgery (1990); Joint replacement; and Sinus  endo w/fusion (Right, 11/06/2015).   Objective:   Vitals: BP 130/76 (BP Location: Right Arm, Patient Position: Sitting, Cuff Size: Large)   Pulse (!) 105   Temp 98.6 F (37 C) (Oral)   Resp 18   Ht 5' 3.75" (1.619 m)   Wt 250 lb (113.4 kg)   SpO2 97%   BMI 43.25 kg/m   Physical Exam  Constitutional: She is oriented to person, place, and time. She appears well-developed and well-nourished.  HENT:  Head: Normocephalic and atraumatic.  Right Ear: Tympanic membrane, external ear and ear canal normal.  Left Ear: Tympanic membrane, external ear and ear canal normal.  Nose: Mucosal edema and rhinorrhea present. Right sinus exhibits no maxillary sinus tenderness and no frontal sinus tenderness. Left sinus exhibits no maxillary sinus tenderness and no frontal sinus tenderness.  Mouth/Throat: Uvula is midline and mucous membranes are normal. Posterior oropharyngeal erythema ( with yellow mucous noted) present. No tonsillar exudate.  Eyes: Conjunctivae are normal.  Neck: Normal range of motion.  Cardiovascular: Normal rate, regular rhythm and normal heart sounds.   Pulmonary/Chest: Effort normal. She has wheezes ( intermittent with forced expirtatory breathing). She has rhonchi ( cleared with coughing).  Lymphadenopathy:       Head (right side): No submental, no submandibular, no tonsillar, no preauricular, no posterior auricular and no occipital adenopathy present.       Head (left side): No submental, no submandibular, no tonsillar, no preauricular, no posterior auricular and no occipital adenopathy present.    She has no cervical adenopathy.       Right: No supraclavicular adenopathy present.       Left: No supraclavicular adenopathy present.  Neurological: She is alert and oriented to person, place, and time.  Skin: Skin is warm and dry.  Psychiatric: She has a normal mood and affect.  Vitals reviewed.   Results for orders placed or performed in visit on  12/16/16 (from the past 24  hour(s))  POCT CBC     Status: Abnormal   Collection Time: 12/16/16  6:23 PM  Result Value Ref Range   WBC 10.1 4.6 - 10.2 K/uL   Lymph, poc 1.7 0.6 - 3.4   POC LYMPH PERCENT 16.5 10 - 50 %L   MID (cbc) 0.6 0 - 0.9   POC MID % 6.0 0 - 12 %M   POC Granulocyte 7.8 (A) 2 - 6.9   Granulocyte percent 77.5 37 - 80 %G   RBC 4.24 4.04 - 5.48 M/uL   Hemoglobin 12.4 12.2 - 16.2 g/dL   HCT, POC 36.0 (A) 37.7 - 47.9 %   MCV 84.8 80 - 97 fL   MCH, POC 29.3 27 - 31.2 pg   MCHC 34.6 31.8 - 35.4 g/dL   RDW, POC 15.6 %   Platelet Count, POC 228 142 - 424 K/uL   MPV 7.1 0 - 99.8 fL     Dg Chest 2 View  Result Date: 12/16/2016 CLINICAL DATA:  Cough x3 weeks EXAM: CHEST  2 VIEW COMPARISON:  Chest CT from 09/01/2015, CXR from 07/29/2015 FINDINGS: The heart size and mediastinal contours are within normal limits for size. There is aortic atherosclerosis. Chronic left basilar atelectasis and/or scarring which on CT appears to be in the lingula. No effusion, pneumothorax nor overt pulmonary edema. Thoracic spondylosis most marked with degenerative disc disease and endplate sclerosis at F0-27 and partial ankylosis of T11-12. IMPRESSION: Aortic atherosclerosis. Left basilar atelectasis and/or scarring localizing to the lingula based on prior CT. Electronically Signed   By: Ashley Royalty M.D.   On: 12/16/2016 18:21   Post breathing treatment, rhonchi and wheezing have cleared. Pt feels much improved.   Assessment and Plan :  1. Cough Will cover for bacterial etiology due to pt's duration and worsening of symptoms. She has an appointment scheduled with her PCP on 12/21/15. Instructed to follow up with him if she has not had any improvement with current treatment regimen. Return to our clinic or ED sooner if any of her symptoms worsen or she develops new concerning symptoms.  - DG Chest 2 View; Future - POCT CBC - azithromycin (ZITHROMAX) 250 MG tablet; Take 2 tabs PO x 1 dose, then 1 tab PO QD x 4 days  Dispense:  6 tablet; Refill: 0  2. Wheezing - albuterol (PROVENTIL) (2.5 MG/3ML) 0.083% nebulizer solution 2.5 mg; Take 3 mLs (2.5 mg total) by nebulization once. - ipratropium (ATROVENT) nebulizer solution 0.5 mg; Take 2.5 mLs (0.5 mg total) by nebulization once.  3. Nasal congestion - fluticasone (FLONASE) 50 MCG/ACT nasal spray; Place 2 sprays into both nostrils daily.  Dispense: 16 g; Refill: 0  Tenna Delaine, PA-C  [facility] 12/16/2016 6:31 PM

## 2016-12-16 NOTE — Patient Instructions (Addendum)
-   We will treat this as a respiratory infection. Take antibiotic as prescribed. Use flonase and OTC 12 hr sudaphed for your sinus and nasal congestion. You may use your nebulizer at home for wheezing.  - I recommend you rest, drink plenty of fluids, eat light meals including soups.  - You may use OTC cough syrup at night for your cough and sore throat, Tessalon pearls during the day. - Please let me know if you are not seeing any improvement or get worse. Follow up with your PCP if you are not feeling any better with the treatment regimen.       Cough, Adult Introduction A cough helps to clear your throat and lungs. A cough may last only 2-3 weeks (acute), or it may last longer than 8 weeks (chronic). Many different things can cause a cough. A cough may be a sign of an illness or another medical condition. Follow these instructions at home:  Pay attention to any changes in your cough.  Take medicines only as told by your doctor.  If you were prescribed an antibiotic medicine, take it as told by your doctor. Do not stop taking it even if you start to feel better.  Talk with your doctor before you try using a cough medicine.  Drink enough fluid to keep your pee (urine) clear or pale yellow.  If the air is dry, use a cold steam vaporizer or humidifier in your home.  Stay away from things that make you cough at work or at home.  If your cough is worse at night, try using extra pillows to raise your head up higher while you sleep.  Do not smoke, and try not to be around smoke. If you need help quitting, ask your doctor.  Do not have caffeine.  Do not drink alcohol.  Rest as needed. Contact a doctor if:  You have new problems (symptoms).  You cough up yellow fluid (pus).  Your cough does not get better after 2-3 weeks, or your cough gets worse.  Medicine does not help your cough and you are not sleeping well.  You have pain that gets worse or pain that is not helped with  medicine.  You have a fever.  You are losing weight and you do not know why.  You have night sweats. Get help right away if:  You cough up blood.  You have trouble breathing.  Your heartbeat is very fast. This information is not intended to replace advice given to you by your health care provider. Make sure you discuss any questions you have with your health care provider. Document Released: 08/18/2011 Document Revised: 05/12/2016 Document Reviewed: 02/11/2015  2017 Elsevier       IF you received an x-ray today, you will receive an invoice from [facility]. Please contact [facility] at [phone] with questions or concerns regarding your invoice.   IF you received labwork today, you will receive an invoice from [facility]. Please contact [facility] at [phone] with questions or concerns regarding your invoice.   Our billing staff will not be able to assist you with questions regarding bills from these companies.  You will be contacted with the lab results as soon as they are available. The fastest way to get your results is to activate your My Chart account. Instructions are located on the last page of this paperwork. If you have not heard from [place] regarding the results in 2 weeks, please contact this office.

## 2016-12-20 ENCOUNTER — Encounter: Payer: Self-pay | Admitting: Internal Medicine

## 2016-12-20 ENCOUNTER — Ambulatory Visit (INDEPENDENT_AMBULATORY_CARE_PROVIDER_SITE_OTHER): Payer: Medicare Other | Admitting: Internal Medicine

## 2016-12-20 VITALS — BP 142/74 | HR 95 | Temp 98.0°F | Ht 63.75 in | Wt 247.2 lb

## 2016-12-20 DIAGNOSIS — J209 Acute bronchitis, unspecified: Secondary | ICD-10-CM

## 2016-12-20 NOTE — Patient Instructions (Addendum)
Call or return to clinic prn if these symptoms worsen or fail to improve as anticipated.    Take over-the-counter expectorants and cough medications such as  Mucinex DM.  Call if there is no improvement in 5 to 7 days or if  you develop worsening cough, fever, or new symptoms, such as shortness of breath or chest pain.  Take your antibiotic as prescribed until ALL of it is gone, but stop if you develop a rash, swelling, or any side effects of the medication.  Contact our office as soon as possible if  there are side effects of the medication.

## 2016-12-20 NOTE — Progress Notes (Signed)
Subjective:    Patient ID: [name], female    DOB: [birth year], 82 y.o.   MRN: [id number]  HPI  82 year old patient who is seen today for follow-up.  She was seen on December 29 for URI with bronchitis and is completing azithromycin antibiotic therapy. She has had a very nice clinical response.  She still has some hoarseness but her cough is much improved. She is on multiple maintenance medications for rhinitis/asthma.  No wheezing.  She does have a home nebulizer treatment available  She is adjusting to the death of her husband on 12/25/2023  Past Medical History:  Diagnosis Date  . Anxiety   . ASTHMA 06/18/2007  . COLONIC POLYPS, HX OF 11/06/2009  . Cough   . Depression   . DYSPNEA ON EXERTION 05/06/2008  . GERD 06/18/2007  . Goiter   . HYPERCHOLESTEROLEMIA, PURE 07/09/2007  . HYPERTENSION 06/18/2007  . HYPOTHYROIDISM 06/18/2007  . Insomnia   . NEPHROLITHIASIS, HX OF 08/07/2008  . Obesity   . OSTEOARTHRITIS 05/07/2008  . Renal cyst   . RENAL INSUFFICIENCY 05/06/2008     Social History   Social History  . Marital status: Widowed    Spouse name: N/A  . Number of children: N/A  . Years of education: N/A   Occupational History  . Retired    Social History Main Topics  . Smoking status: Former Smoker    Packs/day: 0.25    Years: 6.00    Types: Cigarettes    Quit date: 12/19/1978  . Smokeless tobacco: Never Used  . Alcohol use 0.0 oz/week     Comment: "1 glass wine once or twice per month" - 04/09/15  . Drug use: No  . Sexual activity: Not on file   Other Topics Concern  . Not on file   Social History Narrative  . No narrative on file    Past Surgical History:  Procedure Laterality Date  . ABDOMINAL HYSTERECTOMY    . CARPAL TUNNEL RELEASE    . CATARACT EXTRACTION    . JOINT REPLACEMENT    . KNEE ARTHROSCOPY    . LITHOTRIPSY    . NASAL SINUS SURGERY  1990  . SINUS ENDO W/FUSION Right 11/06/2015   Procedure: ENDOSCOPIC SINUS SURGERY WITH NAVIGATION;   Surgeon: Melissa Montane, MD;  Location: [facility] OR;  Service: ENT;  Laterality: Right;  Endoscopic sinus surgery with Fusion protocol, right frontal and ethmoid sinusotomy   . TONSILLECTOMY    . TOTAL KNEE ARTHROPLASTY      Family History  Problem Relation Age of Onset  . Heart Problems Father   . Multiple myeloma Other   . Colon cancer Other   . Hypertension Neg Hx     family  . Cancer Neg Hx     colon ca , prostate ca    No Known Allergies  Current Outpatient Prescriptions on File Prior to Visit  Medication Sig Dispense Refill  . albuterol (PROVENTIL HFA;VENTOLIN HFA) 108 (90 BASE) MCG/ACT inhaler Inhale 2 puffs into the lungs every 6 (six) hours as needed for wheezing or shortness of breath.    [name] Kitchen amLODipine (NORVASC) 5 MG tablet Take 1 tablet (5 mg total) by mouth daily. 90 tablet 3  . azithromycin (ZITHROMAX) 250 MG tablet Take 2 tabs PO x 1 dose, then 1 tab PO QD x 4 days 6 tablet 0  . benzonatate (TESSALON) 200 MG capsule TAKE 1 CAPSULE BY MOUTH THREE TIMES DAILY AS NEEDED FOR COUGH. 20 capsule  0  . cetirizine (ZYRTEC) 10 MG tablet Take 10 mg by mouth daily.    . DULoxetine (CYMBALTA) 30 MG capsule Take 1 capsule (30 mg total) by mouth daily. 90 capsule 3  . esomeprazole (NEXIUM) 40 MG capsule Take 1 capsule (40 mg total) by mouth daily before breakfast. 90 capsule 3  . fluticasone (FLONASE) 50 MCG/ACT nasal spray Place 2 sprays into both nostrils daily. 16 g 0  . furosemide (LASIX) 40 MG tablet TK 1 T PO D PRF LEG SWELLING  2  . levothyroxine (SYNTHROID, LEVOTHROID) 100 MCG tablet Take 1 tablet (100 mcg total) by mouth daily. 90 tablet 3  . montelukast (SINGULAIR) 10 MG tablet TAKE 1 BY MOUTH DAILY 90 tablet 3  . Multiple Vitamin (MULTIVITAMIN) tablet Take 1 tablet by mouth daily.    [name] Kitchen Respiratory Therapy Supplies (FLUTTER) DEVI Use as directed 1 each 0  . simvastatin (ZOCOR) 20 MG tablet TAKE 1 BY MOUTH AT BEDTIME 90 tablet 3  . solifenacin (VESICARE) 10 MG tablet TAKE 1 BY MOUTH  DAILY 90 tablet 3  . traMADol (ULTRAM) 50 MG tablet Take 1 tablet (50 mg total) by mouth every 6 (six) hours as needed for moderate pain. 90 tablet 1  . vitamin C (ASCORBIC ACID) 500 MG tablet Take 500 mg by mouth daily.    . mometasone-formoterol (DULERA) 100-5 MCG/ACT AERO Inhale 2 puffs into the lungs 2 (two) times daily. 1 Inhaler 11  . [DISCONTINUED] solifenacin (VESICARE) 10 MG tablet Take 1 tablet (10 mg total) by mouth daily. 90 tablet 3   No current facility-administered medications on file prior to visit.     BP (!) 142/74 (BP Location: Left Arm, Patient Position: Sitting, Cuff Size: Large)   Pulse 95   Temp 98 F (36.7 C) (Oral)   Ht 5' 3.75" (1.619 m)   Wt 247 lb 4 oz (112.2 kg)   SpO2 91%   BMI 42.77 kg/m     Review of Systems  Constitutional: Positive for activity change, appetite change and fatigue. Negative for chills and fever.  HENT: Positive for congestion and sore throat. Negative for dental problem, hearing loss, rhinorrhea, sinus pressure and tinnitus.   Eyes: Negative for pain, discharge and visual disturbance.  Respiratory: Positive for cough. Negative for shortness of breath.   Cardiovascular: Negative for chest pain, palpitations and leg swelling.  Gastrointestinal: Negative for abdominal distention, abdominal pain, blood in stool, constipation, diarrhea, nausea and vomiting.  Genitourinary: Negative for difficulty urinating, dysuria, flank pain, frequency, hematuria, pelvic pain, urgency, vaginal bleeding, vaginal discharge and vaginal pain.  Musculoskeletal: Negative for arthralgias, gait problem and joint swelling.  Skin: Negative for rash.  Neurological: Negative for dizziness, syncope, speech difficulty, weakness, numbness and headaches.  Hematological: Negative for adenopathy.  Psychiatric/Behavioral: Negative for agitation, behavioral problems and dysphoric mood. The patient is not nervous/anxious.        Objective:   Physical Exam    Constitutional: She is oriented to person, place, and time. She appears well-developed and well-nourished. No distress.  Blood pressure 132/72 Afebrile No distress Hoarse   HENT:  Head: Normocephalic.  Right Ear: External ear normal.  Left Ear: External ear normal.  Mouth/Throat: Oropharynx is clear and moist.  Eyes: Conjunctivae and EOM are normal. Pupils are equal, round, and reactive to light.  Neck: Normal range of motion. Neck supple. No thyromegaly present.  Cardiovascular: Normal rate, regular rhythm, normal heart sounds and intact distal pulses.   Pulmonary/Chest: Effort normal and breath  sounds normal.  Chest is clear  Abdominal: Soft. Bowel sounds are normal. She exhibits no mass. There is no tenderness.  Musculoskeletal: Normal range of motion.  Lymphadenopathy:    She has no cervical adenopathy.  Neurological: She is alert and oriented to person, place, and time.  Skin: Skin is warm and dry. No rash noted.  Psychiatric: She has a normal mood and affect. Her behavior is normal.          Assessment & Plan:   URI/bronchitis.  Patient will complete azithromycin therapy.  Continue maintenance medication, which includes dulera Patient has follow-up next month Will report any clinical worsening  Nyoka Cowden

## 2016-12-20 NOTE — Progress Notes (Signed)
Pre visit review using our clinic review tool, if applicable. No additional management support is needed unless otherwise documented below in the visit note. 

## 2017-01-23 ENCOUNTER — Other Ambulatory Visit: Payer: Self-pay | Admitting: Internal Medicine

## 2017-01-23 ENCOUNTER — Other Ambulatory Visit: Payer: Medicare Other

## 2017-01-24 ENCOUNTER — Other Ambulatory Visit (INDEPENDENT_AMBULATORY_CARE_PROVIDER_SITE_OTHER): Payer: Medicare Other

## 2017-01-24 DIAGNOSIS — Z0000 Encounter for general adult medical examination without abnormal findings: Secondary | ICD-10-CM | POA: Diagnosis not present

## 2017-01-24 LAB — CBC WITH DIFFERENTIAL/PLATELET
BASOS PCT: 0.5 % (ref 0.0–3.0)
Basophils Absolute: 0 10*3/uL (ref 0.0–0.1)
Eosinophils Absolute: 0.2 10*3/uL (ref 0.0–0.7)
Eosinophils Relative: 3.6 % (ref 0.0–5.0)
HEMATOCRIT: 41.1 % (ref 36.0–46.0)
Hemoglobin: 13.1 g/dL (ref 12.0–15.0)
LYMPHS ABS: 1.6 10*3/uL (ref 0.7–4.0)
Lymphocytes Relative: 32.9 % (ref 12.0–46.0)
MCHC: 32 g/dL (ref 30.0–36.0)
MCV: 86.8 fl (ref 78.0–100.0)
MONOS PCT: 7.9 % (ref 3.0–12.0)
Monocytes Absolute: 0.4 10*3/uL (ref 0.1–1.0)
NEUTROS ABS: 2.8 10*3/uL (ref 1.4–7.7)
Neutrophils Relative %: 55.1 % (ref 43.0–77.0)
PLATELETS: 237 10*3/uL (ref 150.0–400.0)
RBC: 4.73 Mil/uL (ref 3.87–5.11)
RDW: 15.9 % — AB (ref 11.5–15.5)
WBC: 5 10*3/uL (ref 4.0–10.5)

## 2017-01-24 LAB — HEPATIC FUNCTION PANEL
ALT: 13 U/L (ref 0–35)
AST: 17 U/L (ref 0–37)
Albumin: 3.5 g/dL (ref 3.5–5.2)
Alkaline Phosphatase: 120 U/L — ABNORMAL HIGH (ref 39–117)
BILIRUBIN DIRECT: 0.1 mg/dL (ref 0.0–0.3)
BILIRUBIN TOTAL: 0.5 mg/dL (ref 0.2–1.2)
Total Protein: 6 g/dL (ref 6.0–8.3)

## 2017-01-24 LAB — BASIC METABOLIC PANEL
BUN: 20 mg/dL (ref 6–23)
CHLORIDE: 106 meq/L (ref 96–112)
CO2: 28 meq/L (ref 19–32)
CREATININE: 1.47 mg/dL — AB (ref 0.40–1.20)
Calcium: 10.1 mg/dL (ref 8.4–10.5)
GFR: 36.41 mL/min — ABNORMAL LOW (ref >60.00)
GLUCOSE: 109 mg/dL — AB (ref 70–99)
Potassium: 4.1 mEq/L (ref 3.5–5.1)
Sodium: 141 mEq/L (ref 135–145)

## 2017-01-24 LAB — LIPID PANEL
CHOLESTEROL: 184 mg/dL (ref 0–200)
HDL: 48.4 mg/dL (ref >39.00)
LDL CALC: 99 mg/dL (ref 0–99)
NonHDL: 135.53
TRIGLYCERIDES: 183 mg/dL — AB (ref 0.0–149.0)
Total CHOL/HDL Ratio: 4
VLDL: 36.6 mg/dL (ref 0.0–40.0)

## 2017-01-24 LAB — TSH: TSH: 7.98 u[IU]/mL — ABNORMAL HIGH (ref 0.35–4.50)

## 2017-01-26 LAB — POC URINALSYSI DIPSTICK (AUTOMATED)
Bilirubin, UA: NEGATIVE
Glucose, UA: NEGATIVE
KETONES UA: NEGATIVE
Nitrite, UA: NEGATIVE
PH UA: 6
SPEC GRAV UA: 1.025
Urobilinogen, UA: 0.2

## 2017-01-30 ENCOUNTER — Encounter: Payer: Self-pay | Admitting: Internal Medicine

## 2017-01-30 ENCOUNTER — Ambulatory Visit (INDEPENDENT_AMBULATORY_CARE_PROVIDER_SITE_OTHER): Payer: Medicare Other | Admitting: Internal Medicine

## 2017-01-30 VITALS — BP 130/74 | HR 93 | Temp 97.6°F | Ht 63.75 in | Wt 236.2 lb

## 2017-01-30 DIAGNOSIS — Z0000 Encounter for general adult medical examination without abnormal findings: Secondary | ICD-10-CM | POA: Diagnosis not present

## 2017-01-30 MED ORDER — MIRABEGRON ER 50 MG PO TB24: 50.0000 mg | ORAL_TABLET | Freq: Every day | ORAL | 6 refills | Status: DC

## 2017-01-30 MED ORDER — LEVOTHYROXINE SODIUM 125 MCG PO TABS: 125.0000 ug | ORAL_TABLET | Freq: Every day | ORAL | 4 refills | Status: DC

## 2017-01-30 NOTE — Progress Notes (Signed)
Pre visit review using our clinic review tool, if applicable. No additional management support is needed unless otherwise documented below in the visit note. 

## 2017-01-30 NOTE — Patient Instructions (Addendum)

## 2017-01-30 NOTE — Progress Notes (Signed)
Subjective:    Patient ID: [name], female    DOB: [birth year], 82 y.o.   MRN: [id number]  HPI  82 year old patient who is seen today for annual health assessment and Medicare wellness visit She has been adjusting to the recent death of her husband.  She has considerable family and social support. She has a history of essential hypertension and dyslipidemia  Main complaint today is frequent urination and some occasional incontinence.  This has not been well controlled with Vesicare.  She has a history of chronic kidney disease and is followed by nephrology.  She has exogenous obesity   Past Medical History:  Diagnosis Date  . Anxiety   . ASTHMA 06/18/2007  . COLONIC POLYPS, HX OF 11/06/2009  . Cough   . Depression   . DYSPNEA ON EXERTION 05/06/2008  . GERD 06/18/2007  . Goiter   . HYPERCHOLESTEROLEMIA, PURE 07/09/2007  . HYPERTENSION 06/18/2007  . HYPOTHYROIDISM 06/18/2007  . Insomnia   . NEPHROLITHIASIS, HX OF 08/07/2008  . Obesity   . OSTEOARTHRITIS 05/07/2008  . Renal cyst   . RENAL INSUFFICIENCY 05/06/2008     Social History   Social History  . Marital status: Widowed    Spouse name: N/A  . Number of children: N/A  . Years of education: N/A   Occupational History  . Retired    Social History Main Topics  . Smoking status: Former Smoker    Packs/day: 0.25    Years: 6.00    Types: Cigarettes    Quit date: 12/19/1978  . Smokeless tobacco: Never Used  . Alcohol use 0.0 oz/week     Comment: "1 glass wine once or twice per month" - 04/09/15  . Drug use: No  . Sexual activity: Not on file   Other Topics Concern  . Not on file   Social History Narrative  . No narrative on file    Past Surgical History:  Procedure Laterality Date  . ABDOMINAL HYSTERECTOMY    . CARPAL TUNNEL RELEASE    . CATARACT EXTRACTION    . JOINT REPLACEMENT    . KNEE ARTHROSCOPY    . LITHOTRIPSY    . NASAL SINUS SURGERY  1990  . SINUS ENDO W/FUSION Right 11/06/2015   Procedure:  ENDOSCOPIC SINUS SURGERY WITH NAVIGATION;  Surgeon: Melissa Montane, MD;  Location: [facility] OR;  Service: ENT;  Laterality: Right;  Endoscopic sinus surgery with Fusion protocol, right frontal and ethmoid sinusotomy   . TONSILLECTOMY    . TOTAL KNEE ARTHROPLASTY      Family History  Problem Relation Age of Onset  . Heart Problems Father   . Multiple myeloma Other   . Colon cancer Other   . Hypertension Neg Hx     family  . Cancer Neg Hx     colon ca , prostate ca    No Known Allergies  Current Outpatient Prescriptions on File Prior to Visit  Medication Sig Dispense Refill  . albuterol (PROVENTIL HFA;VENTOLIN HFA) 108 (90 BASE) MCG/ACT inhaler Inhale 2 puffs into the lungs every 6 (six) hours as needed for wheezing or shortness of breath.    [name] Kitchen amLODipine (NORVASC) 5 MG tablet TAKE 1 TABLET BY MOUTH EVERY DAY 90 tablet 0  . benzonatate (TESSALON) 200 MG capsule TAKE 1 CAPSULE BY MOUTH THREE TIMES DAILY AS NEEDED FOR COUGH. 20 capsule 0  . cetirizine (ZYRTEC) 10 MG tablet Take 10 mg by mouth daily.    . DULoxetine (CYMBALTA) 30 MG capsule  Take 1 capsule (30 mg total) by mouth daily. 90 capsule 3  . esomeprazole (NEXIUM) 40 MG capsule Take 1 capsule (40 mg total) by mouth daily before breakfast. 90 capsule 3  . fluticasone (FLONASE) 50 MCG/ACT nasal spray Place 2 sprays into both nostrils daily. 16 g 0  . furosemide (LASIX) 40 MG tablet TK 1 T PO D PRF LEG SWELLING  2  . montelukast (SINGULAIR) 10 MG tablet TAKE 1 BY MOUTH DAILY 90 tablet 3  . Multiple Vitamin (MULTIVITAMIN) tablet Take 1 tablet by mouth daily.    [name] Kitchen Respiratory Therapy Supplies (FLUTTER) DEVI Use as directed 1 each 0  . simvastatin (ZOCOR) 20 MG tablet TAKE 1 BY MOUTH AT BEDTIME 90 tablet 3  . traMADol (ULTRAM) 50 MG tablet Take 1 tablet (50 mg total) by mouth every 6 (six) hours as needed for moderate pain. 90 tablet 1  . vitamin C (ASCORBIC ACID) 500 MG tablet Take 500 mg by mouth daily.    . mometasone-formoterol (DULERA)  100-5 MCG/ACT AERO Inhale 2 puffs into the lungs 2 (two) times daily. 1 Inhaler 11  . [DISCONTINUED] solifenacin (VESICARE) 10 MG tablet Take 1 tablet (10 mg total) by mouth daily. 90 tablet 3   No current facility-administered medications on file prior to visit.     BP 130/74 (BP Location: Left Arm, Patient Position: Sitting, Cuff Size: Large)   Pulse 93   Temp 97.6 F (36.4 C) (Oral)   Ht 5' 3.75" (1.619 m)   Wt 236 lb 3.2 oz (107.1 kg)   SpO2 97%   BMI 40.86 kg/m   Medicare wellness visit  1. Risk factors, based on past  M,S,F history.  Current vascular risk factors include hypertension and dyslipidemia.  2.  Physical activities:fairly sedentary.  Does have an interest in returning to her health club for more regular exercise  3.  Depression/mood:adjusting to the recent death of her husband  4.  Hearing:no significant deficits  5.  ADL's:independent  6.  Fall risk:moderate due to obesity  7.  Home safety:no problems identified  8.  Height weight, and visual acuity;height and weight stable no change in visual acuity  9.  Counseling:heart healthy diet, weight loss and regular exercise.  All encouraged  10. Lab orders based on risk factors:laboratory studies reviewed  11. Referral :follow-up nephrology  12. Care plan:continue efforts at aggressive risk factor modification  13. Cognitive assessment: alert and oriented with normal affect.  No cognitive dysfunction 14. Screening: Patient provided with a written and personalized 5-10 year screening schedule in the AVS.    15. Provider List Update: primary care, nephrology radiology     Review of Systems  Constitutional: Negative for appetite change, fatigue, fever and unexpected weight change.  HENT: Negative for congestion, dental problem, ear pain, hearing loss, mouth sores, nosebleeds, sinus pressure, sore throat, tinnitus, trouble swallowing and voice change.   Eyes: Negative for photophobia, pain, redness and  visual disturbance.  Respiratory: Negative for cough, chest tightness and shortness of breath.   Cardiovascular: Negative for chest pain, palpitations and leg swelling.  Gastrointestinal: Negative for abdominal distention, abdominal pain, blood in stool, constipation, diarrhea, nausea, rectal pain and vomiting.  Genitourinary: Positive for frequency and urgency. Negative for difficulty urinating, dysuria, flank pain, genital sores, hematuria, menstrual problem, pelvic pain, vaginal bleeding, vaginal discharge and vaginal pain.  Musculoskeletal: Negative for arthralgias, back pain and neck stiffness.  Skin: Negative for rash.  Neurological: Negative for dizziness, syncope, speech difficulty, weakness, light-headedness,  numbness and headaches.  Hematological: Negative for adenopathy. Does not bruise/bleed easily.  Psychiatric/Behavioral: Negative for agitation, behavioral problems, dysphoric mood, self-injury and suicidal ideas. The patient is not nervous/anxious.        Objective:   Physical Exam  Constitutional: She is oriented to person, place, and time. She appears well-developed and well-nourished.  Weight 236 Blood pressure 124/74  HENT:  Head: Normocephalic and atraumatic.  Right Ear: External ear normal.  Left Ear: External ear normal.  Mouth/Throat: Oropharynx is clear and moist.  Eyes: Conjunctivae and EOM are normal.  Neck: Normal range of motion. Neck supple. No JVD present. No thyromegaly present.  Cardiovascular: Normal rate, regular rhythm, normal heart sounds and intact distal pulses.   No murmur heard. Pulmonary/Chest: Effort normal and breath sounds normal. She has no wheezes. She has no rales.  Abdominal: Soft. Bowel sounds are normal. She exhibits no distension and no mass. There is no tenderness. There is no rebound and no guarding.  Musculoskeletal: Normal range of motion. She exhibits no edema or tenderness.  Status post bilateral TKA  Neurological: She is alert  and oriented to person, place, and time. She has normal reflexes. No cranial nerve deficit. She exhibits normal muscle tone. Coordination normal.  Skin: Skin is warm and dry. No rash noted.  Psychiatric: She has a normal mood and affect. Her behavior is normal.          Assessment & Plan:   Preventive health examination Medicare wellness visit Essential hypertension, well-controlled Exogenous obesity.  Weight loss encouraged.  The patient is motivated to return to her health club History of asthma, stable Overactive bladder.  Will give a trial of Myrbetriq  Chronic kidney disease.  Follow-up nephrology Dyslipidemia.  Continue statin therapy  Follow-up 6 months  [name] Pilar Plate

## 2017-01-31 ENCOUNTER — Other Ambulatory Visit: Payer: Self-pay | Admitting: Internal Medicine

## 2017-02-09 ENCOUNTER — Other Ambulatory Visit: Payer: Self-pay | Admitting: Internal Medicine

## 2017-02-09 MED ORDER — TRAMADOL HCL 50 MG PO TABS: 50.0000 mg | ORAL_TABLET | Freq: Four times a day (QID) | ORAL | 0 refills | Status: DC | PRN

## 2017-02-14 ENCOUNTER — Telehealth: Payer: Self-pay

## 2017-02-14 NOTE — Telephone Encounter (Signed)
Received PA request from insurance company for Myrbetriq 50 mg tablets. PA submitted & is pending. Key: [id number]

## 2017-02-15 NOTE — Telephone Encounter (Signed)
tolterodine SR 4mg    #30 one daily refill times 3

## 2017-02-15 NOTE — Telephone Encounter (Signed)
PA denied, patient has to try at least two alternatives: Oxybutynin immediate release tablets tolterodine SR Toviaz

## 2017-02-15 NOTE — Telephone Encounter (Signed)
See message below, please advise.

## 2017-02-16 MED ORDER — TOLTERODINE TARTRATE ER 4 MG PO CP24: 4.0000 mg | ORAL_CAPSULE | Freq: Every day | ORAL | 3 refills | Status: DC

## 2017-02-16 NOTE — Telephone Encounter (Signed)
Left a detailed message on voicemail to call office, informed that a new Rx was sent to [facility].

## 2017-02-16 NOTE — Addendum Note (Signed)
Addended by: Abelardo Diesel on: 02/16/2017 02:11 PM   Modules accepted: Orders

## 2017-02-17 ENCOUNTER — Other Ambulatory Visit: Payer: Self-pay | Admitting: Internal Medicine

## 2017-02-17 NOTE — Telephone Encounter (Signed)
Spoke with pt and made her aware of medication changes. Pt verbalized understanding

## 2017-03-03 ENCOUNTER — Other Ambulatory Visit: Payer: Self-pay | Admitting: Internal Medicine

## 2017-05-05 ENCOUNTER — Other Ambulatory Visit: Payer: Self-pay | Admitting: Internal Medicine

## 2017-05-17 ENCOUNTER — Other Ambulatory Visit: Payer: Self-pay | Admitting: Internal Medicine

## 2017-06-13 ENCOUNTER — Other Ambulatory Visit: Payer: Self-pay | Admitting: Internal Medicine

## 2017-06-13 ENCOUNTER — Other Ambulatory Visit: Payer: Self-pay | Admitting: Family Medicine

## 2017-06-15 MED ORDER — TRAMADOL HCL 50 MG PO TABS: 50.0000 mg | ORAL_TABLET | Freq: Four times a day (QID) | ORAL | 0 refills | Status: DC | PRN

## 2017-07-19 ENCOUNTER — Other Ambulatory Visit: Payer: Self-pay | Admitting: Internal Medicine

## 2017-08-09 ENCOUNTER — Other Ambulatory Visit: Payer: Self-pay | Admitting: Internal Medicine

## 2017-08-16 LAB — HM MAMMOGRAPHY

## 2017-08-23 ENCOUNTER — Encounter: Payer: Self-pay | Admitting: Internal Medicine

## 2017-09-05 ENCOUNTER — Ambulatory Visit (INDEPENDENT_AMBULATORY_CARE_PROVIDER_SITE_OTHER): Payer: Medicare Other | Admitting: *Deleted

## 2017-09-05 DIAGNOSIS — Z23 Encounter for immunization: Secondary | ICD-10-CM

## 2017-10-31 ENCOUNTER — Other Ambulatory Visit: Payer: Self-pay | Admitting: Internal Medicine

## 2017-11-01 ENCOUNTER — Other Ambulatory Visit: Payer: Self-pay | Admitting: Internal Medicine

## 2017-11-14 ENCOUNTER — Other Ambulatory Visit: Payer: Self-pay | Admitting: Internal Medicine

## 2017-11-23 ENCOUNTER — Other Ambulatory Visit: Payer: Self-pay | Admitting: Internal Medicine

## 2017-12-05 ENCOUNTER — Ambulatory Visit: Payer: Medicare Other | Admitting: Internal Medicine

## 2017-12-05 ENCOUNTER — Encounter: Payer: Self-pay | Admitting: Internal Medicine

## 2017-12-05 VITALS — BP 148/80 | HR 93 | Temp 98.1°F | Ht 63.75 in | Wt 248.6 lb

## 2017-12-05 DIAGNOSIS — R0609 Other forms of dyspnea: Secondary | ICD-10-CM | POA: Diagnosis not present

## 2017-12-05 DIAGNOSIS — J4520 Mild intermittent asthma, uncomplicated: Secondary | ICD-10-CM

## 2017-12-05 DIAGNOSIS — R0600 Dyspnea, unspecified: Secondary | ICD-10-CM

## 2017-12-05 DIAGNOSIS — I10 Essential (primary) hypertension: Secondary | ICD-10-CM | POA: Diagnosis not present

## 2017-12-05 DIAGNOSIS — R002 Palpitations: Secondary | ICD-10-CM

## 2017-12-05 MED ORDER — HYDROXYZINE HCL 25 MG PO TABS: 25.0000 mg | ORAL_TABLET | Freq: Three times a day (TID) | ORAL | 2 refills | Status: DC | PRN

## 2017-12-05 NOTE — Patient Instructions (Signed)
Return in February for your annual exam  Call or return to clinic prn if these symptoms worsen or fail to improve as anticipated.  You need to lose weight.  Consider a lower calorie diet and regular exercise.

## 2017-12-05 NOTE — Progress Notes (Signed)
Subjective:    Patient ID: [name], female    DOB: [birth year], 82 y.o.   MRN: [id number]  HPI  82 year old patient has a history of essential hypertension and mild asthma. She presents with a chief complaint of generalized itching that is only bothersome at night.  She states that Atarax in the past has been quite helpful and wishes to take as needed. Since her last visit here she has had 13 pounds of weight gain.  She is eating out much more since the death of her husband. She does complain of some increasing dyspnea on exertion.  Denies any exertional chest pain she also describes some occasional" skipping" of her heart.  This has been present for about 2 months.  Denies any exertional chest pain  Past Medical History:  Diagnosis Date  . Anxiety   . ASTHMA 06/18/2007  . COLONIC POLYPS, HX OF 11/06/2009  . Cough   . Depression   . DYSPNEA ON EXERTION 05/06/2008  . GERD 06/18/2007  . Goiter   . HYPERCHOLESTEROLEMIA, PURE 07/09/2007  . HYPERTENSION 06/18/2007  . HYPOTHYROIDISM 06/18/2007  . Insomnia   . NEPHROLITHIASIS, HX OF 08/07/2008  . Obesity   . OSTEOARTHRITIS 05/07/2008  . Renal cyst   . RENAL INSUFFICIENCY 05/06/2008     Social History   Socioeconomic History  . Marital status: Widowed    Spouse name: Not on file  . Number of children: Not on file  . Years of education: Not on file  . Highest education level: Not on file  Social Needs  . Financial resource strain: Not on file  . Food insecurity - worry: Not on file  . Food insecurity - inability: Not on file  . Transportation needs - medical: Not on file  . Transportation needs - non-medical: Not on file  Occupational History  . Occupation: Retired  Tobacco Use  . Smoking status: Former Smoker    Packs/day: 0.25    Years: 6.00    Pack years: 1.50    Types: Cigarettes    Last attempt to quit: 12/19/1978    Years since quitting: 38.9  . Smokeless tobacco: Never Used  Substance and Sexual Activity  . Alcohol  use: Yes    Alcohol/week: 0.0 oz    Comment: "1 glass wine once or twice per month" - 04/09/15  . Drug use: No  . Sexual activity: Not on file  Other Topics Concern  . Not on file  Social History Narrative  . Not on file    Past Surgical History:  Procedure Laterality Date  . ABDOMINAL HYSTERECTOMY    . CARPAL TUNNEL RELEASE    . CATARACT EXTRACTION    . JOINT REPLACEMENT    . KNEE ARTHROSCOPY    . LITHOTRIPSY    . NASAL SINUS SURGERY  1990  . SINUS ENDO W/FUSION Right 11/06/2015   Procedure: ENDOSCOPIC SINUS SURGERY WITH NAVIGATION;  Surgeon: Melissa Montane, MD;  Location: [facility] OR;  Service: ENT;  Laterality: Right;  Endoscopic sinus surgery with Fusion protocol, right frontal and ethmoid sinusotomy   . TONSILLECTOMY    . TOTAL KNEE ARTHROPLASTY      Family History  Problem Relation Age of Onset  . Heart Problems Father   . Multiple myeloma Other   . Colon cancer Other   . Hypertension Neg Hx        family  . Cancer Neg Hx        colon ca , prostate ca  No Known Allergies  Current Outpatient Medications on File Prior to Visit  Medication Sig Dispense Refill  . albuterol (PROVENTIL HFA;VENTOLIN HFA) 108 (90 BASE) MCG/ACT inhaler Inhale 2 puffs into the lungs every 6 (six) hours as needed for wheezing or shortness of breath.    [name] Kitchen amLODipine (NORVASC) 5 MG tablet TAKE 1 TABLET BY MOUTH EVERY DAY 90 tablet 0  . benzonatate (TESSALON) 200 MG capsule TAKE 1 CAPSULE BY MOUTH THREE TIMES DAILY AS NEEDED FOR COUGH. 20 capsule 0  . cetirizine (ZYRTEC) 10 MG tablet Take 10 mg by mouth daily.    . DULoxetine (CYMBALTA) 30 MG capsule TAKE ONE CAPSULE BY MOUTH EVERY DAY 90 capsule 0  . esomeprazole (NEXIUM) 40 MG capsule Take 1 capsule (40 mg total) by mouth daily before breakfast. 90 capsule 3  . fluticasone (FLONASE) 50 MCG/ACT nasal spray Place 2 sprays into both nostrils daily. 16 g 0  . furosemide (LASIX) 40 MG tablet TK 1 T PO D PRF LEG SWELLING  2  . levothyroxine (SYNTHROID,  LEVOTHROID) 125 MCG tablet Take 1 tablet (125 mcg total) by mouth daily. 90 tablet 4  . mirabegron ER (MYRBETRIQ) 50 MG TB24 tablet Take 1 tablet (50 mg total) by mouth daily. 30 tablet 6  . montelukast (SINGULAIR) 10 MG tablet TAKE 1 TABLET BY MOUTH EVERY DAY 90 tablet 0  . Multiple Vitamin (MULTIVITAMIN) tablet Take 1 tablet by mouth daily.    [name] Kitchen Respiratory Therapy Supplies (FLUTTER) DEVI Use as directed 1 each 0  . simvastatin (ZOCOR) 20 MG tablet TAKE 1 TABLET BY MOUTH AT BEDTIME 90 tablet 0  . tolterodine (DETROL LA) 4 MG 24 hr capsule TAKE 1 CAPSULE(4 MG) BY MOUTH DAILY 90 capsule 3  . traMADol (ULTRAM) 50 MG tablet Take 1 tablet (50 mg total) by mouth every 6 (six) hours as needed for moderate pain. 90 tablet 0  . vitamin C (ASCORBIC ACID) 500 MG tablet Take 500 mg by mouth daily.    . mometasone-formoterol (DULERA) 100-5 MCG/ACT AERO Inhale 2 puffs into the lungs 2 (two) times daily. 1 Inhaler 11  . [DISCONTINUED] solifenacin (VESICARE) 10 MG tablet Take 1 tablet (10 mg total) by mouth daily. 90 tablet 3   No current facility-administered medications on file prior to visit.     BP (!) 148/80 (BP Location: Left Arm, Patient Position: Sitting, Cuff Size: Normal)   Pulse 93   Temp 98.1 F (36.7 C) (Oral)   Ht 5' 3.75" (1.619 m)   Wt 248 lb 9.6 oz (112.8 kg)   SpO2 97%   BMI 43.01 kg/m     Review of Systems  Constitutional: Positive for unexpected weight change.  HENT: Negative for congestion, dental problem, hearing loss, rhinorrhea, sinus pressure, sore throat and tinnitus.   Eyes: Negative for pain, discharge and visual disturbance.  Respiratory: Positive for shortness of breath. Negative for cough.   Cardiovascular: Positive for palpitations. Negative for chest pain and leg swelling.  Gastrointestinal: Negative for abdominal distention, abdominal pain, blood in stool, constipation, diarrhea, nausea and vomiting.  Genitourinary: Negative for difficulty urinating, dysuria,  flank pain, frequency, hematuria, pelvic pain, urgency, vaginal bleeding, vaginal discharge and vaginal pain.  Musculoskeletal: Negative for arthralgias, gait problem and joint swelling.  Skin: Negative for rash.  Neurological: Negative for dizziness, syncope, speech difficulty, weakness, numbness and headaches.  Hematological: Negative for adenopathy.  Psychiatric/Behavioral: Negative for agitation, behavioral problems and dysphoric mood. The patient is not nervous/anxious.  Objective:   Physical Exam  Constitutional: She is oriented to person, place, and time. She appears well-developed and well-nourished. No distress.  Repeat blood pressure 130/68 Weight 248.6  HENT:  Head: Normocephalic.  Right Ear: External ear normal.  Left Ear: External ear normal.  Mouth/Throat: Oropharynx is clear and moist.  Eyes: Conjunctivae and EOM are normal. Pupils are equal, round, and reactive to light.  Neck: Normal range of motion. Neck supple. No thyromegaly present.  Cardiovascular: Normal rate, regular rhythm, normal heart sounds and intact distal pulses.  Pulmonary/Chest: Effort normal and breath sounds normal. No respiratory distress. She has no wheezes. She has no rales.  Abdominal: Soft. Bowel sounds are normal. She exhibits no mass. There is no tenderness.  Musculoskeletal: Normal range of motion.  Lymphadenopathy:    She has no cervical adenopathy.  Neurological: She is alert and oriented to person, place, and time.  Skin: Skin is warm and dry. No rash noted.  Psychiatric: She has a normal mood and affect. Her behavior is normal.          Assessment & Plan:   Generalized pruritus.  This occurs at bedtime only.  Patient was cautioned against overheating.  New Rx  for Atarax dispensed which has been helpful in the past Essential hypertension stable DOE.  Multifactorial.  Patient denies any exertional chest pain.  She does have asthma and a history of weight gain will observe  at this point reassess the time of physical in February Obesity Palpitations.  EKG reviewed this revealed a single [place]     For Atarax dispensed which has been helpful in the past

## 2017-12-08 ENCOUNTER — Other Ambulatory Visit: Payer: Self-pay | Admitting: Internal Medicine

## 2018-01-02 ENCOUNTER — Other Ambulatory Visit: Payer: Self-pay | Admitting: Internal Medicine

## 2018-01-19 ENCOUNTER — Other Ambulatory Visit: Payer: Self-pay | Admitting: Internal Medicine

## 2018-01-29 ENCOUNTER — Other Ambulatory Visit: Payer: Self-pay | Admitting: Internal Medicine

## 2018-02-01 ENCOUNTER — Encounter: Payer: Self-pay | Admitting: Internal Medicine

## 2018-02-01 ENCOUNTER — Ambulatory Visit (INDEPENDENT_AMBULATORY_CARE_PROVIDER_SITE_OTHER): Payer: Medicare Other | Admitting: Internal Medicine

## 2018-02-01 ENCOUNTER — Other Ambulatory Visit: Payer: Self-pay | Admitting: Internal Medicine

## 2018-02-01 VITALS — BP 150/82 | HR 91 | Temp 98.7°F | Ht 64.5 in | Wt 246.0 lb

## 2018-02-01 DIAGNOSIS — I10 Essential (primary) hypertension: Secondary | ICD-10-CM | POA: Diagnosis not present

## 2018-02-01 DIAGNOSIS — E039 Hypothyroidism, unspecified: Secondary | ICD-10-CM

## 2018-02-01 DIAGNOSIS — Z86010 Personal history of colonic polyps: Secondary | ICD-10-CM | POA: Diagnosis not present

## 2018-02-01 DIAGNOSIS — E7800 Pure hypercholesterolemia, unspecified: Secondary | ICD-10-CM | POA: Diagnosis not present

## 2018-02-01 DIAGNOSIS — N170 Acute kidney failure with tubular necrosis: Secondary | ICD-10-CM | POA: Diagnosis not present

## 2018-02-01 DIAGNOSIS — Z0000 Encounter for general adult medical examination without abnormal findings: Secondary | ICD-10-CM | POA: Diagnosis not present

## 2018-02-01 DIAGNOSIS — J45991 Cough variant asthma: Secondary | ICD-10-CM | POA: Diagnosis not present

## 2018-02-01 DIAGNOSIS — N184 Chronic kidney disease, stage 4 (severe): Secondary | ICD-10-CM | POA: Diagnosis not present

## 2018-02-01 LAB — COMPREHENSIVE METABOLIC PANEL
ALBUMIN: 3.8 g/dL (ref 3.5–5.2)
ALT: 17 U/L (ref 0–35)
AST: 20 U/L (ref 0–37)
Alkaline Phosphatase: 123 U/L — ABNORMAL HIGH (ref 39–117)
BUN: 21 mg/dL (ref 6–23)
CHLORIDE: 104 meq/L (ref 96–112)
CO2: 31 mEq/L (ref 19–32)
CREATININE: 1.65 mg/dL — AB (ref 0.40–1.20)
Calcium: 10 mg/dL (ref 8.4–10.5)
GFR: 31.79 mL/min — ABNORMAL LOW (ref >60.00)
Glucose, Bld: 94 mg/dL (ref 70–99)
Potassium: 4.3 mEq/L (ref 3.5–5.1)
SODIUM: 141 meq/L (ref 135–145)
TOTAL PROTEIN: 6.3 g/dL (ref 6.0–8.3)
Total Bilirubin: 0.7 mg/dL (ref 0.2–1.2)

## 2018-02-01 LAB — CBC WITH DIFFERENTIAL/PLATELET
BASOS PCT: 0.2 % (ref 0.0–3.0)
Basophils Absolute: 0 10*3/uL (ref 0.0–0.1)
EOS ABS: 0.2 10*3/uL (ref 0.0–0.7)
Eosinophils Relative: 2.2 % (ref 0.0–5.0)
HCT: 42.1 % (ref 36.0–46.0)
HEMOGLOBIN: 13.7 g/dL (ref 12.0–15.0)
LYMPHS ABS: 1.7 10*3/uL (ref 0.7–4.0)
Lymphocytes Relative: 21.8 % (ref 12.0–46.0)
MCHC: 32.6 g/dL (ref 30.0–36.0)
MCV: 84.4 fl (ref 78.0–100.0)
Monocytes Absolute: 0.6 10*3/uL (ref 0.1–1.0)
Monocytes Relative: 7.3 % (ref 3.0–12.0)
Neutro Abs: 5.4 10*3/uL (ref 1.4–7.7)
Neutrophils Relative %: 68.5 % (ref 43.0–77.0)
PLATELETS: 234 10*3/uL (ref 150.0–400.0)
RBC: 4.99 Mil/uL (ref 3.87–5.11)
RDW: 17.7 % — AB (ref 11.5–15.5)
WBC: 7.9 10*3/uL (ref 4.0–10.5)

## 2018-02-01 LAB — LIPID PANEL
CHOLESTEROL: 172 mg/dL (ref 0–200)
HDL: 59.1 mg/dL (ref >39.00)
LDL CALC: 80 mg/dL (ref 0–99)
NonHDL: 112.75
Total CHOL/HDL Ratio: 3
Triglycerides: 165 mg/dL — ABNORMAL HIGH (ref 0.0–149.0)
VLDL: 33 mg/dL (ref 0.0–40.0)

## 2018-02-01 LAB — TSH: TSH: 2.05 u[IU]/mL (ref 0.35–4.50)

## 2018-02-01 NOTE — Progress Notes (Signed)
Subjective:    Patient ID: [name], female    DOB: 1935-05-16, 83 y.o.   MRN: [id number]  HPI 83 year-old patient who is seen today for annual health assessment and Medicare wellness visit  She has a history of essential hypertension and dyslipidemia  Main complaint today is frequent urination and some occasional incontinence.  This has not been well controlled with Vesicare.  She has a history of chronic kidney disease and is followed by nephrology.  She has exogenous obesity.  Myrbetriq has been on her medicine list but it is unclear whether she is taking this medication  She is followed annually by nephrology due to chronic kidney disease. For the past 3-4 weeks she has had some mild numbness involving the right First second and third digits  Past Medical History:  Diagnosis Date  . Anxiety   . ASTHMA 06/18/2007  . COLONIC POLYPS, HX OF 11/06/2009  . Cough   . Depression   . DYSPNEA ON EXERTION 05/06/2008  . GERD 06/18/2007  . Goiter   . HYPERCHOLESTEROLEMIA, PURE 07/09/2007  . HYPERTENSION 06/18/2007  . HYPOTHYROIDISM 06/18/2007  . Insomnia   . NEPHROLITHIASIS, HX OF 08/07/2008  . Obesity   . OSTEOARTHRITIS 05/07/2008  . Renal cyst   . RENAL INSUFFICIENCY 05/06/2008     Social History   Socioeconomic History  . Marital status: Widowed    Spouse name: Not on file  . Number of children: Not on file  . Years of education: Not on file  . Highest education level: Not on file  Social Needs  . Financial resource strain: Not on file  . Food insecurity - worry: Not on file  . Food insecurity - inability: Not on file  . Transportation needs - medical: Not on file  . Transportation needs - non-medical: Not on file  Occupational History  . Occupation: Retired  Tobacco Use  . Smoking status: Former Smoker    Packs/day: 0.25    Years: 6.00    Pack years: 1.50    Types: Cigarettes    Last attempt to quit: 12/19/1978    Years since quitting: 39.1  . Smokeless tobacco:  Never Used  Substance and Sexual Activity  . Alcohol use: Yes    Alcohol/week: 0.0 oz    Comment: "1 glass wine once or twice per month" - 04/09/15  . Drug use: No  . Sexual activity: Not on file  Other Topics Concern  . Not on file  Social History Narrative  . Not on file    Past Surgical History:  Procedure Laterality Date  . ABDOMINAL HYSTERECTOMY    . CARPAL TUNNEL RELEASE    . CATARACT EXTRACTION    . JOINT REPLACEMENT    . KNEE ARTHROSCOPY    . LITHOTRIPSY    . NASAL SINUS SURGERY  1990  . SINUS ENDO W/FUSION Right 11/06/2015   Procedure: ENDOSCOPIC SINUS SURGERY WITH NAVIGATION;  Surgeon: Melissa Montane, MD;  Location: [facility] OR;  Service: ENT;  Laterality: Right;  Endoscopic sinus surgery with Fusion protocol, right frontal and ethmoid sinusotomy   . TONSILLECTOMY    . TOTAL KNEE ARTHROPLASTY      Family History  Problem Relation Age of Onset  . Heart Problems Father   . Multiple myeloma Other   . Colon cancer Other   . Hypertension Neg Hx        family  . Cancer Neg Hx        colon ca ,  prostate ca    No Known Allergies  Current Outpatient Medications on File Prior to Visit  Medication Sig Dispense Refill  . albuterol (PROVENTIL HFA;VENTOLIN HFA) 108 (90 BASE) MCG/ACT inhaler Inhale 2 puffs into the lungs every 6 (six) hours as needed for wheezing or shortness of breath.    [name] Kitchen amLODipine (NORVASC) 5 MG tablet TAKE 1 TABLET BY MOUTH EVERY DAY 90 tablet 0  . benzonatate (TESSALON) 200 MG capsule TAKE 1 CAPSULE BY MOUTH THREE TIMES DAILY AS NEEDED FOR COUGH. 20 capsule 0  . cetirizine (ZYRTEC) 10 MG tablet Take 10 mg by mouth daily.    . DULoxetine (CYMBALTA) 30 MG capsule TAKE ONE CAPSULE BY MOUTH EVERY DAY 90 capsule 0  . esomeprazole (NEXIUM) 40 MG capsule Take 1 capsule (40 mg total) by mouth daily before breakfast. 90 capsule 3  . fluticasone (FLONASE) 50 MCG/ACT nasal spray Place 2 sprays into both nostrils daily. 16 g 0  . furosemide (LASIX) 40 MG tablet TK 1 T PO  D PRF LEG SWELLING  2  . hydrOXYzine (ATARAX/VISTARIL) 25 MG tablet Take 1 tablet (25 mg total) by mouth 3 (three) times daily as needed. 60 tablet 2  . levothyroxine (SYNTHROID, LEVOTHROID) 125 MCG tablet Take 1 tablet (125 mcg total) by mouth daily. 90 tablet 4  . mirabegron ER (MYRBETRIQ) 50 MG TB24 tablet Take 1 tablet (50 mg total) by mouth daily. 30 tablet 6  . montelukast (SINGULAIR) 10 MG tablet TAKE 1 TABLET BY MOUTH EVERY DAY 90 tablet 0  . Multiple Vitamin (MULTIVITAMIN) tablet Take 1 tablet by mouth daily.    [name] Kitchen Respiratory Therapy Supplies (FLUTTER) DEVI Use as directed 1 each 0  . simvastatin (ZOCOR) 20 MG tablet TAKE 1 TABLET BY MOUTH AT BEDTIME 90 tablet 0  . tolterodine (DETROL LA) 4 MG 24 hr capsule TAKE 1 CAPSULE(4 MG) BY MOUTH DAILY 90 capsule 3  . traMADol (ULTRAM) 50 MG tablet Take 1 tablet (50 mg total) by mouth every 6 (six) hours as needed for moderate pain. 90 tablet 0  . vitamin C (ASCORBIC ACID) 500 MG tablet Take 500 mg by mouth daily.    . mometasone-formoterol (DULERA) 100-5 MCG/ACT AERO Inhale 2 puffs into the lungs 2 (two) times daily. 1 Inhaler 11  . [DISCONTINUED] solifenacin (VESICARE) 10 MG tablet Take 1 tablet (10 mg total) by mouth daily. 90 tablet 3   No current facility-administered medications on file prior to visit.     BP (!) 150/82 (BP Location: Left Arm, Patient Position: Sitting, Cuff Size: Large)   Pulse 91   Temp 98.7 F (37.1 C) (Oral)   Ht 5' 4.5" (1.638 m)   Wt 246 lb (111.6 kg)   SpO2 96%   BMI 41.57 kg/m   Subsequent Medicare wellness visit  Medicare wellness visit  1. Risk factors, based on past  M,S,F history.  Current vascular risk factors include hypertension and dyslipidemia.  2.  Physical activities:fairly sedentary.  Does have an interest in returning to her health club for more regular exercise.  Is active at [facility] during the winter during the ski season  3.  Depression/mood:adjusting to the recent death of  her husband  4.  Hearing:no significant deficits  5.  ADL's:independent  6.  Fall risk:moderate due to obesity  7.  Home safety:no problems identified.  Uses an alarm to assist with health if she were to have a fall  8.  Height weight, and visual acuity;height and weight stable  no change in visual acuity  9.  Counseling:heart healthy diet, weight loss and regular exercise.  All encouraged  10. Lab orders based on risk factors:laboratory studies reviewed  11. Referral :follow-up nephrology  12. Care plan:continue efforts at aggressive risk factor modification  13. Cognitive assessment: alert and oriented with normal affect.  No cognitive dysfunction 14. Screening: Patient provided with a written and personalized 5-10 year screening schedule in the AVS.    15. Provider List Update: primary care, nephrology radiology orthopedics and ophthalmology     Review of Systems  Constitutional: Positive for unexpected weight change.  HENT: Negative for congestion, dental problem, hearing loss, rhinorrhea, sinus pressure, sore throat and tinnitus.   Eyes: Negative for pain, discharge and visual disturbance.  Respiratory: Negative for cough and shortness of breath.   Cardiovascular: Negative for chest pain, palpitations and leg swelling.  Gastrointestinal: Negative for abdominal distention, abdominal pain, blood in stool, constipation, diarrhea, nausea and vomiting.  Genitourinary: Positive for frequency. Negative for difficulty urinating, dysuria, flank pain, hematuria, pelvic pain, urgency, vaginal bleeding, vaginal discharge and vaginal pain.  Musculoskeletal: Positive for arthralgias, back pain and gait problem. Negative for joint swelling.  Skin: Negative for rash.  Neurological: Negative for dizziness, syncope, speech difficulty, weakness, numbness and headaches.  Hematological: Negative for adenopathy.  Psychiatric/Behavioral: Negative for agitation, behavioral problems  and dysphoric mood. The patient is not nervous/anxious.        Objective:   Physical Exam  Constitutional: She is oriented to person, place, and time. She appears well-developed and well-nourished.  Weight 246  Blood pressure 140/80  HENT:  Head: Normocephalic and atraumatic.  Right Ear: External ear normal.  Left Ear: External ear normal.  Mouth/Throat: Oropharynx is clear and moist.  Eyes: Conjunctivae and EOM are normal.  Neck: Normal range of motion. Neck supple. No JVD present. No thyromegaly present.  Cardiovascular: Normal rate, regular rhythm, normal heart sounds and intact distal pulses.  No murmur heard. Pulmonary/Chest: Effort normal and breath sounds normal. She has no wheezes. She has no rales.  Abdominal: Soft. Bowel sounds are normal. She exhibits no distension and no mass. There is no tenderness. There is no rebound and no guarding.  Musculoskeletal: Normal range of motion. She exhibits no edema or tenderness.  Trace edema  Neurological: She is alert and oriented to person, place, and time. She has normal reflexes. No cranial nerve deficit. She exhibits normal muscle tone. Coordination normal.  Skin: Skin is warm and dry. No rash noted.  Psychiatric: She has a normal mood and affect. Her behavior is normal.          Assessment & Plan:  Preventive health care  Subsequent Medicare wellness visit Overactive bladder.  Will resume mirabegron Chronic kidney disease.  Follow-up nephrology as scheduled Dyslipidemia continue statin therapy.  Will review a lipid profile hypothyroidism.  Will review a TSH Allergic rhinitis History of asthma stable  Follow-up 6-12 months  Sussie Minor Pilar Plate

## 2018-02-01 NOTE — Patient Instructions (Addendum)
Limit your sodium (Salt) intake  Please check your blood pressure on a regular basis.  If it is consistently greater than 150/90, please make an office appointment.   Health Maintenance for Postmenopausal Women Menopause is a normal process in which your reproductive ability comes to an end. This process happens gradually over a span of months to years, usually between the ages of 82 and 75. Menopause is complete when you have missed 12 consecutive menstrual periods. It is important to talk with your health care provider about some of the most common conditions that affect postmenopausal women, such as heart disease, cancer, and bone loss (osteoporosis). Adopting a healthy lifestyle and getting preventive care can help to promote your health and wellness. Those actions can also lower your chances of developing some of these common conditions. What should I know about menopause? During menopause, you may experience a number of symptoms, such as:  Moderate-to-severe hot flashes.  Night sweats.  Decrease in sex drive.  Mood swings.  Headaches.  Tiredness.  Irritability.  Memory problems.  Insomnia.  Choosing to treat or not to treat menopausal changes is an individual decision that you make with your health care provider. What should I know about hormone replacement therapy and supplements? Hormone therapy products are effective for treating symptoms that are associated with menopause, such as hot flashes and night sweats. Hormone replacement carries certain risks, especially as you become older. If you are thinking about using estrogen or estrogen with progestin treatments, discuss the benefits and risks with your health care provider. What should I know about heart disease and stroke? Heart disease, heart attack, and stroke become more likely as you age. This may be due, in part, to the hormonal changes that your body experiences during menopause. These can affect how your body  processes dietary fats, triglycerides, and cholesterol. Heart attack and stroke are both medical emergencies. There are many things that you can do to help prevent heart disease and stroke:  Have your blood pressure checked at least every 1-2 years. High blood pressure causes heart disease and increases the risk of stroke.  If you are 32-75 years old, ask your health care provider if you should take aspirin to prevent a heart attack or a stroke.  Do not use any tobacco products, including cigarettes, chewing tobacco, or electronic cigarettes. If you need help quitting, ask your health care provider.  It is important to eat a healthy diet and maintain a healthy weight. ? Be sure to include plenty of vegetables, fruits, low-fat dairy products, and lean protein. ? Avoid eating foods that are high in solid fats, added sugars, or salt (sodium).  Get regular exercise. This is one of the most important things that you can do for your health. ? Try to exercise for at least 150 minutes each week. The type of exercise that you do should increase your heart rate and make you sweat. This is known as moderate-intensity exercise. ? Try to do strengthening exercises at least twice each week. Do these in addition to the moderate-intensity exercise.  Know your numbers.Ask your health care provider to check your cholesterol and your blood glucose. Continue to have your blood tested as directed by your health care provider.  What should I know about cancer screening? There are several types of cancer. Take the following steps to reduce your risk and to catch any cancer development as early as possible. Breast Cancer  Practice breast self-awareness. ? This means understanding how your breasts  normally appear and feel. ? It also means doing regular breast self-exams. Let your health care provider know about any changes, no matter how small.  If you are 24 or older, have a clinician do a breast exam (clinical  breast exam or CBE) every year. Depending on your age, family history, and medical history, it may be recommended that you also have a yearly breast X-ray (mammogram).  If you have a family history of breast cancer, talk with your health care provider about genetic screening.  If you are at high risk for breast cancer, talk with your health care provider about having an MRI and a mammogram every year.  Breast cancer (BRCA) gene test is recommended for women who have family members with BRCA-related cancers. Results of the assessment will determine the need for genetic counseling and BRCA1 and for BRCA2 testing. BRCA-related cancers include these types: ? Breast. This occurs in males or females. ? Ovarian. ? Tubal. This may also be called fallopian tube cancer. ? Cancer of the abdominal or pelvic lining (peritoneal cancer). ? Prostate. ? Pancreatic.  Cervical, Uterine, and Ovarian Cancer Your health care provider may recommend that you be screened regularly for cancer of the pelvic organs. These include your ovaries, uterus, and vagina. This screening involves a pelvic exam, which includes checking for microscopic changes to the surface of your cervix (Pap test).  For women ages 21-65, health care providers may recommend a pelvic exam and a Pap test every three years. For women ages 83-65, they may recommend the Pap test and pelvic exam, combined with testing for human papilloma virus (HPV), every five years. Some types of HPV increase your risk of cervical cancer. Testing for HPV may also be done on women of any age who have unclear Pap test results.  Other health care providers may not recommend any screening for nonpregnant women who are considered low risk for pelvic cancer and have no symptoms. Ask your health care provider if a screening pelvic exam is right for you.  If you have had past treatment for cervical cancer or a condition that could lead to cancer, you need Pap tests and  screening for cancer for at least 20 years after your treatment. If Pap tests have been discontinued for you, your risk factors (such as having a new sexual partner) need to be reassessed to determine if you should start having screenings again. Some women have medical problems that increase the chance of getting cervical cancer. In these cases, your health care provider may recommend that you have screening and Pap tests more often.  If you have a family history of uterine cancer or ovarian cancer, talk with your health care provider about genetic screening.  If you have vaginal bleeding after reaching menopause, tell your health care provider.  There are currently no reliable tests available to screen for ovarian cancer.  Lung Cancer Lung cancer screening is recommended for adults 40-54 years old who are at high risk for lung cancer because of a history of smoking. A yearly low-dose CT scan of the lungs is recommended if you:  Currently smoke.  Have a history of at least 30 pack-years of smoking and you currently smoke or have quit within the past 15 years. A pack-year is smoking an average of one pack of cigarettes per day for one year.  Yearly screening should:  Continue until it has been 15 years since you quit.  Stop if you develop a health problem that would prevent  you from having lung cancer treatment.  Colorectal Cancer  This type of cancer can be detected and can often be prevented.  Routine colorectal cancer screening usually begins at age 4 and continues through age 78.  If you have risk factors for colon cancer, your health care provider may recommend that you be screened at an earlier age.  If you have a family history of colorectal cancer, talk with your health care provider about genetic screening.  Your health care provider may also recommend using home test kits to check for hidden blood in your stool.  A small camera at the end of a tube can be used to examine  your colon directly (sigmoidoscopy or colonoscopy). This is done to check for the earliest forms of colorectal cancer.  Direct examination of the colon should be repeated every 5-10 years until age 69. However, if early forms of precancerous polyps or small growths are found or if you have a family history or genetic risk for colorectal cancer, you may need to be screened more often.  Skin Cancer  Check your skin from head to toe regularly.  Monitor any moles. Be sure to tell your health care provider: ? About any new moles or changes in moles, especially if there is a change in a mole's shape or color. ? If you have a mole that is larger than the size of a pencil eraser.  If any of your family members has a history of skin cancer, especially at a young age, talk with your health care provider about genetic screening.  Always use sunscreen. Apply sunscreen liberally and repeatedly throughout the day.  Whenever you are outside, protect yourself by wearing long sleeves, pants, a wide-brimmed hat, and sunglasses.  What should I know about osteoporosis? Osteoporosis is a condition in which bone destruction happens more quickly than new bone creation. After menopause, you may be at an increased risk for osteoporosis. To help prevent osteoporosis or the bone fractures that can happen because of osteoporosis, the following is recommended:  If you are 98-48 years old, get at least 1,000 mg of calcium and at least 600 mg of vitamin D per day.  If you are older than age 69 but younger than age 5, get at least 1,200 mg of calcium and at least 600 mg of vitamin D per day.  If you are older than age 58, get at least 1,200 mg of calcium and at least 800 mg of vitamin D per day.  Smoking and excessive alcohol intake increase the risk of osteoporosis. Eat foods that are rich in calcium and vitamin D, and do weight-bearing exercises several times each week as directed by your health care provider. What  should I know about how menopause affects my mental health? Depression may occur at any age, but it is more common as you become older. Common symptoms of depression include:  Low or sad mood.  Changes in sleep patterns.  Changes in appetite or eating patterns.  Feeling an overall lack of motivation or enjoyment of activities that you previously enjoyed.  Frequent crying spells.  Talk with your health care provider if you think that you are experiencing depression. What should I know about immunizations? It is important that you get and maintain your immunizations. These include:  Tetanus, diphtheria, and pertussis (Tdap) booster vaccine.  Influenza every year before the flu season begins.  Pneumonia vaccine.  Shingles vaccine.  Your health care provider may also recommend other immunizations. This information  is not intended to replace advice given to you by your health care provider. Make sure you discuss any questions you have with your health care provider. Document Released: 01/27/2006 Document Revised: 06/24/2016 Document Reviewed: 09/08/2015 Elsevier Interactive Patient Education  2018 [employer].

## 2018-02-02 NOTE — Telephone Encounter (Signed)
Medication filled to pharmacy as requested.   

## 2018-02-11 ENCOUNTER — Other Ambulatory Visit: Payer: Self-pay | Admitting: Internal Medicine

## 2018-02-21 ENCOUNTER — Other Ambulatory Visit: Payer: Self-pay | Admitting: Internal Medicine

## 2018-03-21 ENCOUNTER — Other Ambulatory Visit: Payer: Self-pay | Admitting: Internal Medicine

## 2018-03-22 ENCOUNTER — Other Ambulatory Visit: Payer: Self-pay | Admitting: Internal Medicine

## 2018-04-04 ENCOUNTER — Other Ambulatory Visit: Payer: Self-pay | Admitting: Internal Medicine

## 2018-05-05 ENCOUNTER — Other Ambulatory Visit: Payer: Self-pay | Admitting: Internal Medicine

## 2018-05-13 ENCOUNTER — Other Ambulatory Visit: Payer: Self-pay | Admitting: Internal Medicine

## 2018-06-02 ENCOUNTER — Other Ambulatory Visit: Payer: Self-pay | Admitting: Internal Medicine

## 2018-07-02 ENCOUNTER — Encounter: Payer: Self-pay | Admitting: Internal Medicine

## 2018-07-02 ENCOUNTER — Ambulatory Visit: Payer: Medicare Other | Admitting: Internal Medicine

## 2018-07-02 VITALS — BP 140/88 | HR 96 | Temp 98.6°F | Wt 237.6 lb

## 2018-07-02 DIAGNOSIS — I10 Essential (primary) hypertension: Secondary | ICD-10-CM | POA: Diagnosis not present

## 2018-07-02 DIAGNOSIS — E039 Hypothyroidism, unspecified: Secondary | ICD-10-CM | POA: Diagnosis not present

## 2018-07-02 DIAGNOSIS — J4520 Mild intermittent asthma, uncomplicated: Secondary | ICD-10-CM

## 2018-07-02 DIAGNOSIS — E7800 Pure hypercholesterolemia, unspecified: Secondary | ICD-10-CM | POA: Diagnosis not present

## 2018-07-02 NOTE — Patient Instructions (Signed)
Limit your sodium (Salt) intake  You need to lose weight.  Consider a lower calorie diet and regular exercise.  Please check your blood pressure on a regular basis.  If it is consistently greater than 140/90, please make an office appointment.

## 2018-07-02 NOTE — Progress Notes (Signed)
Subjective:    Patient ID: [name], female    DOB: [birth year], 83 y.o.   MRN: [id number]  HPI  83 year old patient who is seen today for follow-up of hypertension.  She has chronic kidney disease as well as a history of asthma and is followed by nephrology.  2 to 3 weeks ago she was noted by a friend while at [facility] that her blood pressure was elevated.  Patient does not recall the actual reading.  BP Readings from Last 3 Encounters:  07/02/18 140/88  02/01/18 (!) 150/82  12/05/17 (!) 148/80  She does have a history of pedal edema and does use furosemide sparingly. She is on treatment for overactive bladder which is associated with occasional incontinence. She has chronic stable asthma  Past Medical History:  Diagnosis Date  . Anxiety   . ASTHMA 06/18/2007  . COLONIC POLYPS, HX OF 11/06/2009  . Cough   . Depression   . DYSPNEA ON EXERTION 05/06/2008  . GERD 06/18/2007  . Goiter   . HYPERCHOLESTEROLEMIA, PURE 07/09/2007  . HYPERTENSION 06/18/2007  . HYPOTHYROIDISM 06/18/2007  . Insomnia   . NEPHROLITHIASIS, HX OF 08/07/2008  . Obesity   . OSTEOARTHRITIS 05/07/2008  . Renal cyst   . RENAL INSUFFICIENCY 05/06/2008     Social History   Socioeconomic History  . Marital status: Widowed    Spouse name: Not on file  . Number of children: Not on file  . Years of education: Not on file  . Highest education level: Not on file  Occupational History  . Occupation: Retired  Scientific laboratory technician  . Financial resource strain: Not on file  . Food insecurity:    Worry: Not on file    Inability: Not on file  . Transportation needs:    Medical: Not on file    Non-medical: Not on file  Tobacco Use  . Smoking status: Former Smoker    Packs/day: 0.25    Years: 6.00    Pack years: 1.50    Types: Cigarettes    Last attempt to quit: 12/19/1978    Years since quitting: 39.5  . Smokeless tobacco: Never Used  Substance and Sexual Activity  . Alcohol use: Yes    Alcohol/week: 0.0 oz    Comment: "1 glass wine once or twice per month" - 04/09/15  . Drug use: No  . Sexual activity: Not on file  Lifestyle  . Physical activity:    Days per week: Not on file    Minutes per session: Not on file  . Stress: Not on file  Relationships  . Social connections:    Talks on phone: Not on file    Gets together: Not on file    Attends religious service: Not on file    Active member of club or organization: Not on file    Attends meetings of clubs or organizations: Not on file    Relationship status: Not on file  . Intimate partner violence:    Fear of current or ex partner: Not on file    Emotionally abused: Not on file    Physically abused: Not on file    Forced sexual activity: Not on file  Other Topics Concern  . Not on file  Social History Narrative  . Not on file    Past Surgical History:  Procedure Laterality Date  . ABDOMINAL HYSTERECTOMY    . CARPAL TUNNEL RELEASE    . CATARACT EXTRACTION    . JOINT REPLACEMENT    .  KNEE ARTHROSCOPY    . LITHOTRIPSY    . NASAL SINUS SURGERY  1990  . SINUS ENDO W/FUSION Right 11/06/2015   Procedure: ENDOSCOPIC SINUS SURGERY WITH NAVIGATION;  Surgeon: Melissa Montane, MD;  Location: [facility] OR;  Service: ENT;  Laterality: Right;  Endoscopic sinus surgery with Fusion protocol, right frontal and ethmoid sinusotomy   . TONSILLECTOMY    . TOTAL KNEE ARTHROPLASTY      Family History  Problem Relation Age of Onset  . Heart Problems Father   . Multiple myeloma Other   . Colon cancer Other   . Hypertension Neg Hx        family  . Cancer Neg Hx        colon ca , prostate ca    No Known Allergies  Current Outpatient Medications on File Prior to Visit  Medication Sig Dispense Refill  . albuterol (PROVENTIL HFA;VENTOLIN HFA) 108 (90 BASE) MCG/ACT inhaler Inhale 2 puffs into the lungs every 6 (six) hours as needed for wheezing or shortness of breath.    [name] Kitchen amLODipine (NORVASC) 5 MG tablet TAKE 1 TABLET BY MOUTH EVERY DAY 90 tablet 1  .  benzonatate (TESSALON) 200 MG capsule TAKE 1 CAPSULE BY MOUTH THREE TIMES DAILY AS NEEDED FOR COUGH. 20 capsule 0  . cetirizine (ZYRTEC) 10 MG tablet Take 10 mg by mouth daily.    . DULoxetine (CYMBALTA) 30 MG capsule TAKE ONE CAPSULE BY MOUTH EVERY DAY 90 capsule 0  . esomeprazole (NEXIUM) 40 MG capsule Take 1 capsule (40 mg total) by mouth daily before breakfast. 90 capsule 3  . fluticasone (FLONASE) 50 MCG/ACT nasal spray Place 2 sprays into both nostrils daily. 16 g 0  . furosemide (LASIX) 40 MG tablet TK 1 T PO D PRF LEG SWELLING  2  . hydrOXYzine (ATARAX/VISTARIL) 25 MG tablet Take 1 tablet (25 mg total) by mouth 3 (three) times daily as needed. 60 tablet 2  . levothyroxine (SYNTHROID, LEVOTHROID) 125 MCG tablet TAKE 1 TABLET(125 MCG) BY MOUTH DAILY 90 tablet 3  . mirabegron ER (MYRBETRIQ) 50 MG TB24 tablet Take 1 tablet (50 mg total) by mouth daily. 30 tablet 6  . mometasone-formoterol (DULERA) 100-5 MCG/ACT AERO Inhale 2 puffs into the lungs 2 (two) times daily. 1 Inhaler 11  . montelukast (SINGULAIR) 10 MG tablet TAKE 1 TABLET BY MOUTH EVERY DAY 90 tablet 0  . Multiple Vitamin (MULTIVITAMIN) tablet Take 1 tablet by mouth daily.    [name] Kitchen Respiratory Therapy Supplies (FLUTTER) DEVI Use as directed 1 each 0  . simvastatin (ZOCOR) 20 MG tablet TAKE 1 TABLET BY MOUTH AT BEDTIME 90 tablet 0  . tolterodine (DETROL LA) 4 MG 24 hr capsule TAKE 1 CAPSULE(4 MG) BY MOUTH DAILY 90 capsule 1  . traMADol (ULTRAM) 50 MG tablet TAKE 1 TABLET BY MOUTH EVERY 6 HOURS AS NEEDED FOR MODERATE PAIN 90 tablet 0  . vitamin C (ASCORBIC ACID) 500 MG tablet Take 500 mg by mouth daily.    . [DISCONTINUED] solifenacin (VESICARE) 10 MG tablet Take 1 tablet (10 mg total) by mouth daily. 90 tablet 3   No current facility-administered medications on file prior to visit.     BP 140/88 (BP Location: Left Arm, Patient Position: Sitting, Cuff Size: Large)   Pulse 96   Temp 98.6 F (37 C) (Oral)   Wt 237 lb 9.6 oz (107.8 kg)    SpO2 94%   BMI 40.15 kg/m     Review of Systems  Constitutional:  Negative.   HENT: Negative for congestion, dental problem, hearing loss, rhinorrhea, sinus pressure, sore throat and tinnitus.   Eyes: Negative for pain, discharge and visual disturbance.  Respiratory: Negative for cough and shortness of breath.   Cardiovascular: Positive for leg swelling. Negative for chest pain and palpitations.  Gastrointestinal: Negative for abdominal distention, abdominal pain, blood in stool, constipation, diarrhea, nausea and vomiting.  Genitourinary: Positive for frequency and urgency. Negative for difficulty urinating, dysuria, flank pain, hematuria, pelvic pain, vaginal bleeding, vaginal discharge and vaginal pain.  Musculoskeletal: Negative for arthralgias, gait problem and joint swelling.  Skin: Negative for rash.  Neurological: Negative for dizziness, syncope, speech difficulty, weakness, numbness and headaches.  Hematological: Negative for adenopathy.  Psychiatric/Behavioral: Negative for agitation, behavioral problems and dysphoric mood. The patient is not nervous/anxious.        Objective:   Physical Exam  Constitutional: She is oriented to person, place, and time. She appears well-developed and well-nourished.  Weight 237 Blood pressure right arm 130/80 Blood pressure left arm 135/78  HENT:  Head: Normocephalic.  Right Ear: External ear normal.  Left Ear: External ear normal.  Mouth/Throat: Oropharynx is clear and moist.  Eyes: Pupils are equal, round, and reactive to light. Conjunctivae and EOM are normal.  Neck: Normal range of motion. Neck supple. No thyromegaly present.  Cardiovascular: Normal rate, regular rhythm, normal heart sounds and intact distal pulses.  Pulmonary/Chest: Effort normal and breath sounds normal.  Abdominal: Soft. Bowel sounds are normal. She exhibits no mass. There is no tenderness.  Musculoskeletal: Normal range of motion.  Lymphadenopathy:    She  has no cervical adenopathy.  Neurological: She is alert and oriented to person, place, and time.  Skin: Skin is warm and dry. No rash noted.  Psychiatric: She has a normal mood and affect. Her behavior is normal.          Assessment & Plan:   Hypertension.  Blood pressure under reasonable control.  No change in regimen at this time low-salt diet weight loss or exercise all discussed and encouraged Chronic kidney disease.  Follow-up nephrology as scheduled  Patient will follow-up with a new provider within the next 3 to 4 months Medications updated  Marletta Lor

## 2018-08-03 ENCOUNTER — Other Ambulatory Visit: Payer: Self-pay | Admitting: Internal Medicine

## 2018-08-22 LAB — HM MAMMOGRAPHY

## 2018-08-27 ENCOUNTER — Encounter: Payer: Self-pay | Admitting: Internal Medicine

## 2018-09-02 ENCOUNTER — Other Ambulatory Visit: Payer: Self-pay | Admitting: Internal Medicine

## 2018-09-24 ENCOUNTER — Other Ambulatory Visit: Payer: Self-pay | Admitting: Internal Medicine

## 2018-10-10 ENCOUNTER — Encounter: Payer: Medicare Other | Admitting: Family

## 2018-10-11 NOTE — Progress Notes (Signed)
[name] DOB: Feb 21, 1935 Encounter date: 10/12/2018  This isa 83 y.o. female who presents to establish care. Chief Complaint  Patient presents with  . Transitions Of Care    no new concerns, flu shot, due for any other vaccines?    History of present illness: Has a physical scheduled In February.   HTN: Usually bp runs about 130/70 at home; not sure why it is up today. CKD followed by nephro q 6 months. Uses caution with medicines she takes.   Asthma: Has been doing well. Wheezes occasionally and uses inhaler, but if bad will follow with allergist. Had a little summer cough in evening, but not something that bothered her. Uses dulera.   Heart skips a beat and has discussed with previous provider. Notices this every 20 beats or so. Not sure if she has been evaluated. Has been going on for 6-8 months. No pain. Nothing seems to worsen it; just notes when she checks her pulse. Notes more at night when still, lying down.   Hypothyroid: on synthroid.   Arthritis: uses tylenol if needed for pain.   Mood is good. Not sure why she started cymbalta.  Sometimes hard to fall asleep but sleeps well once she does fall asleep.    Past Medical History:  Diagnosis Date  . Anxiety   . ASTHMA 06/18/2007  . COLONIC POLYPS, HX OF 11/06/2009  . Cough   . Depression   . DYSPNEA ON EXERTION 05/06/2008  . GERD 06/18/2007  . Goiter   . HYPERCHOLESTEROLEMIA, PURE 07/09/2007  . HYPERTENSION 06/18/2007  . HYPOTHYROIDISM 06/18/2007  . Insomnia   . NEPHROLITHIASIS, HX OF 08/07/2008  . Obesity   . OSTEOARTHRITIS 05/07/2008  . Renal cyst   . RENAL INSUFFICIENCY 05/06/2008   Past Surgical History:  Procedure Laterality Date  . ABDOMINAL HYSTERECTOMY    . CARPAL TUNNEL RELEASE    . CATARACT EXTRACTION    . JOINT REPLACEMENT    . KNEE ARTHROSCOPY    . LITHOTRIPSY    . NASAL SINUS SURGERY  1990  . SINUS ENDO W/FUSION Right 11/06/2015   Procedure: ENDOSCOPIC SINUS SURGERY WITH NAVIGATION;  Surgeon:  Melissa Montane, MD;  Location: [facility] OR;  Service: ENT;  Laterality: Right;  Endoscopic sinus surgery with Fusion protocol, right frontal and ethmoid sinusotomy   . TONSILLECTOMY    . TOTAL KNEE ARTHROPLASTY     No Known Allergies Current Meds  Medication Sig  . albuterol (PROVENTIL HFA;VENTOLIN HFA) 108 (90 BASE) MCG/ACT inhaler Inhale 2 puffs into the lungs every 6 (six) hours as needed for wheezing or shortness of breath.  [name] Kitchen amLODipine (NORVASC) 5 MG tablet TAKE 1 TABLET BY MOUTH EVERY DAY  . benzonatate (TESSALON) 200 MG capsule TAKE 1 CAPSULE BY MOUTH THREE TIMES DAILY AS NEEDED FOR COUGH.  . cetirizine (ZYRTEC) 10 MG tablet Take 10 mg by mouth daily.  . DULoxetine (CYMBALTA) 30 MG capsule TAKE ONE CAPSULE BY MOUTH EVERY DAY  . esomeprazole (NEXIUM) 40 MG capsule Take 1 capsule (40 mg total) by mouth daily before breakfast.  . fluticasone (FLONASE) 50 MCG/ACT nasal spray Place 2 sprays into both nostrils daily.  . furosemide (LASIX) 40 MG tablet TK 1 T PO D PRF LEG SWELLING  . hydrOXYzine (ATARAX/VISTARIL) 25 MG tablet Take 1 tablet (25 mg total) by mouth 3 (three) times daily as needed.  [name] Kitchen levothyroxine (SYNTHROID, LEVOTHROID) 125 MCG tablet TAKE 1 TABLET(125 MCG) BY MOUTH DAILY  . mirabegron ER (MYRBETRIQ) 50 MG TB24  tablet Take 1 tablet (50 mg total) by mouth daily.  . montelukast (SINGULAIR) 10 MG tablet TAKE 1 TABLET BY MOUTH EVERY DAY  . Multiple Vitamin (MULTIVITAMIN) tablet Take 1 tablet by mouth daily.  [name] Kitchen Respiratory Therapy Supplies (FLUTTER) DEVI Use as directed  . simvastatin (ZOCOR) 20 MG tablet TAKE 1 TABLET BY MOUTH AT BEDTIME  . tolterodine (DETROL LA) 4 MG 24 hr capsule TAKE 1 CAPSULE(4 MG) BY MOUTH DAILY  . traMADol (ULTRAM) 50 MG tablet TAKE 1 TABLET BY MOUTH EVERY 6 HOURS AS NEEDED FOR MODERATE PAIN  . vitamin C (ASCORBIC ACID) 500 MG tablet Take 500 mg by mouth daily.   Social History   Tobacco Use  . Smoking status: Former Smoker    Packs/day: 0.25    Years: 6.00     Pack years: 1.50    Types: Cigarettes    Last attempt to quit: 12/19/1978    Years since quitting: 39.8  . Smokeless tobacco: Never Used  Substance Use Topics  . Alcohol use: Yes    Alcohol/week: 0.0 standard drinks    Comment: "1 glass wine once or twice per month" - 04/09/15   Family History  Problem Relation Age of Onset  . Heart Problems Father   . Multiple myeloma Other   . Colon cancer Other   . Hypertension Neg Hx        family  . Cancer Neg Hx        colon ca , prostate ca     Review of Systems  Constitutional: Negative for chills, fatigue and fever.  Respiratory: Positive for cough (gets cough on and off; was sick a few weeks ago but feels it is resolving now). Negative for chest tightness, shortness of breath and wheezing.   Cardiovascular: Negative for chest pain, palpitations and leg swelling.  Psychiatric/Behavioral: Sleep disturbance: sometimes due to urination; not regularly. The patient is not nervous/anxious.     Objective:  BP (!) 150/70 (BP Location: Left Arm, Patient Position: Sitting, Cuff Size: Normal)   Pulse (!) 103   Temp 97.9 F (36.6 C) (Oral)   Wt 239 lb 12.8 oz (108.8 kg)   SpO2 91%   BMI 40.53 kg/m   Weight: 239 lb 12.8 oz (108.8 kg)   BP Readings from Last 3 Encounters:  10/12/18 (!) 150/70  07/02/18 140/88  02/01/18 (!) 150/82   Wt Readings from Last 3 Encounters:  10/12/18 239 lb 12.8 oz (108.8 kg)  07/02/18 237 lb 9.6 oz (107.8 kg)  02/01/18 246 lb (111.6 kg)    Physical Exam  Constitutional: She is oriented to person, place, and time. She appears well-developed and well-nourished. No distress.  Cardiovascular: Normal rate and regular rhythm.  Occasional extrasystoles are present. Exam reveals no friction rub.  Murmur heard.  Systolic murmur is present with a grade of 2/6. No lower extremity edema  Pulmonary/Chest: Effort normal. No respiratory distress. She has decreased breath sounds (slightly). She has no wheezes. She has no  rales.  Neurological: She is alert and oriented to person, place, and time.  Psychiatric: Her behavior is normal. Cognition and memory are normal.    Assessment/Plan: 1. Need for shingles vaccine  - Zoster Vaccine Adjuvanted [facility]) injection; Inject 0.5 mLs into the muscle once for 1 dose. Repeat in 2-6 months  Dispense: 0.5 mL; Refill: 0  2. Palpitations EKG appears stable even when compared with previous from 10 years ago. Although on exam I could hear possible premature beat; this was  not detected on EKG and was isolated on exam (single; while maintaining normal rythym) which is reassuring. - EKG 12-Lead  3. HYPERCHOLESTEROLEMIA, PURE  - Comprehensive metabolic panel; Future - Lipid panel; Future  4. Essential hypertension  - Comprehensive metabolic panel; Future - CBC with Differential/Platelet; Future  5. Mild intermittent asthma without complication Continue with current dulera; albuterol prn. Stable.   6. Gastroesophageal reflux disease, esophagitis presence not specified Continue with nexium; has symptoms with any trial to decrease dose.  7. Acquired hypothyroidism  - TSH; Future  8. Needs flu shot  - Flu vaccine HIGH DOSE PF (Fluzone High dose)  Return has physical scheduled in Feb.  Micheline Rough, MD

## 2018-10-12 ENCOUNTER — Encounter: Payer: Self-pay | Admitting: Family Medicine

## 2018-10-12 ENCOUNTER — Ambulatory Visit: Payer: Medicare Other | Admitting: Family Medicine

## 2018-10-12 VITALS — BP 150/70 | HR 103 | Temp 97.9°F | Wt 239.8 lb

## 2018-10-12 DIAGNOSIS — R002 Palpitations: Secondary | ICD-10-CM | POA: Diagnosis not present

## 2018-10-12 DIAGNOSIS — Z23 Encounter for immunization: Secondary | ICD-10-CM | POA: Diagnosis not present

## 2018-10-12 DIAGNOSIS — E7800 Pure hypercholesterolemia, unspecified: Secondary | ICD-10-CM

## 2018-10-12 DIAGNOSIS — K219 Gastro-esophageal reflux disease without esophagitis: Secondary | ICD-10-CM

## 2018-10-12 DIAGNOSIS — E039 Hypothyroidism, unspecified: Secondary | ICD-10-CM

## 2018-10-12 DIAGNOSIS — J4520 Mild intermittent asthma, uncomplicated: Secondary | ICD-10-CM

## 2018-10-12 DIAGNOSIS — I10 Essential (primary) hypertension: Secondary | ICD-10-CM | POA: Diagnosis not present

## 2018-10-12 MED ORDER — ZOSTER VAC RECOMB ADJUVANTED 50 MCG/0.5ML IM SUSR: 0.5000 mL | Freq: Once | INTRAMUSCULAR | 0 refills | Status: AC

## 2018-11-01 ENCOUNTER — Other Ambulatory Visit: Payer: Self-pay | Admitting: Internal Medicine

## 2018-11-11 ENCOUNTER — Other Ambulatory Visit: Payer: Self-pay | Admitting: Internal Medicine

## 2018-12-02 ENCOUNTER — Other Ambulatory Visit: Payer: Self-pay | Admitting: Family Medicine

## 2018-12-03 NOTE — Telephone Encounter (Signed)
Last filled 08/03/18, last filled by Dr. Raliegh Ip Last OV 10/12/18  Ok to fill?

## 2018-12-06 ENCOUNTER — Other Ambulatory Visit: Payer: Self-pay | Admitting: Internal Medicine

## 2018-12-18 ENCOUNTER — Ambulatory Visit: Payer: Self-pay

## 2018-12-18 NOTE — Telephone Encounter (Signed)
Pt. Reports she started cough 1 week ago. Has developed wheezing at night - used her inhaler and it helped. Non-productive cough. States she "doesn't want it to get any worse.I feel a little better today." No availability with Dr. Ethlyn Gallery. Appointment made for Friday. Instructed pt. If symptoms worsen to got to ED.  Reason for Disposition . [1] Continuous (nonstop) coughing interferes with work or school AND [2] no improvement using cough treatment per protocol  Answer Assessment - Initial Assessment Questions 1. ONSET: "When did the cough begin?"      Started 1 week ago 2. SEVERITY: "How bad is the cough today?"      Mild 3. RESPIRATORY DISTRESS: "Describe your breathing."      No distress 4. FEVER: "Do you have a fever?" If so, ask: "What is your temperature, how was it measured, and when did it start?"     No 5. HEMOPTYSIS: "Are you coughing up any blood?" If so ask: "How much?" (flecks, streaks, tablespoons, etc.)     No 6. TREATMENT: "What have you done so far to treat the cough?" (e.g., meds, fluids, humidifier)     Inhaler 7. CARDIAC HISTORY: "Do you have any history of heart disease?" (e.g., heart attack, congestive heart failure)      No 8. LUNG HISTORY: "Do you have any history of lung disease?"  (e.g., pulmonary embolus, asthma, emphysema)     Asthma 9. PE RISK FACTORS: "Do you have a history of blood clots?" (or: recent major surgery, recent prolonged travel, bedridden)     No 10. OTHER SYMPTOMS: "Do you have any other symptoms? (e.g., runny nose, wheezing, chest pain)       Wheezing at night, runny nose 11. PREGNANCY: "Is there any chance you are pregnant?" "When was your last menstrual period?"       No 12. TRAVEL: "Have you traveled out of the country in the last month?" (e.g., travel history, exposures)       No  Protocols used: COUGH - ACUTE NON-PRODUCTIVE-A-AH

## 2018-12-18 NOTE — Telephone Encounter (Signed)
Appointment made with Dr. Sarajane Jews due to Dr. Ethlyn Gallery being out of the office.  Will send as FYI

## 2018-12-21 ENCOUNTER — Encounter: Payer: Self-pay | Admitting: Family Medicine

## 2018-12-21 ENCOUNTER — Ambulatory Visit (INDEPENDENT_AMBULATORY_CARE_PROVIDER_SITE_OTHER): Payer: PPO | Admitting: Family Medicine

## 2018-12-21 VITALS — BP 142/84 | HR 104 | Temp 98.8°F | Ht 65.0 in | Wt 242.1 lb

## 2018-12-21 DIAGNOSIS — J40 Bronchitis, not specified as acute or chronic: Secondary | ICD-10-CM | POA: Diagnosis not present

## 2018-12-21 MED ORDER — AZITHROMYCIN 250 MG PO TABS: ORAL_TABLET | ORAL | 0 refills | Status: DC

## 2018-12-21 NOTE — Progress Notes (Signed)
   Subjective:    Patient ID: [name], female    DOB: 1935/12/06, 84 y.o.   MRN: [id number]  HPI Here for one week of stuffy head, PND, and a dry cough. No fever.    Review of Systems  Constitutional: Negative.   HENT: Positive for congestion and postnasal drip. Negative for sinus pressure, sinus pain and sore throat.   Eyes: Negative.   Respiratory: Positive for cough.        Objective:   Physical Exam Constitutional:      Appearance: Normal appearance.  HENT:     Right Ear: Tympanic membrane and ear canal normal.     Left Ear: Tympanic membrane and ear canal normal.     Nose: Nose normal.     Mouth/Throat:     Pharynx: Oropharynx is clear.  Eyes:     Conjunctiva/sclera: Conjunctivae normal.  Pulmonary:     Effort: Pulmonary effort is normal. No respiratory distress.     Breath sounds: Normal breath sounds. No stridor. No wheezing, rhonchi or rales.  Lymphadenopathy:     Cervical: No cervical adenopathy.  Neurological:     Mental Status: She is alert.           Assessment & Plan:  Bronchitis, treat with a Zpack. Add Mucinex prn.  Alysia Penna, MD

## 2018-12-26 NOTE — Telephone Encounter (Signed)
Last filled 09/03/18 filled by Dr. Ethlyn Gallery Last OV 10/12/18  ok to fill

## 2018-12-26 NOTE — Telephone Encounter (Signed)
Pt is still waiting on refill. Please advise. (seems was not routed since it was in K's name)

## 2019-01-08 DIAGNOSIS — M5136 Other intervertebral disc degeneration, lumbar region: Secondary | ICD-10-CM | POA: Diagnosis not present

## 2019-01-08 DIAGNOSIS — M9905 Segmental and somatic dysfunction of pelvic region: Secondary | ICD-10-CM | POA: Diagnosis not present

## 2019-01-08 DIAGNOSIS — M9903 Segmental and somatic dysfunction of lumbar region: Secondary | ICD-10-CM | POA: Diagnosis not present

## 2019-01-08 DIAGNOSIS — M9904 Segmental and somatic dysfunction of sacral region: Secondary | ICD-10-CM | POA: Diagnosis not present

## 2019-01-09 DIAGNOSIS — M9903 Segmental and somatic dysfunction of lumbar region: Secondary | ICD-10-CM | POA: Diagnosis not present

## 2019-01-09 DIAGNOSIS — M5136 Other intervertebral disc degeneration, lumbar region: Secondary | ICD-10-CM | POA: Diagnosis not present

## 2019-01-09 DIAGNOSIS — M9905 Segmental and somatic dysfunction of pelvic region: Secondary | ICD-10-CM | POA: Diagnosis not present

## 2019-01-09 DIAGNOSIS — M9904 Segmental and somatic dysfunction of sacral region: Secondary | ICD-10-CM | POA: Diagnosis not present

## 2019-01-14 DIAGNOSIS — M9905 Segmental and somatic dysfunction of pelvic region: Secondary | ICD-10-CM | POA: Diagnosis not present

## 2019-01-14 DIAGNOSIS — M9903 Segmental and somatic dysfunction of lumbar region: Secondary | ICD-10-CM | POA: Diagnosis not present

## 2019-01-14 DIAGNOSIS — M5136 Other intervertebral disc degeneration, lumbar region: Secondary | ICD-10-CM | POA: Diagnosis not present

## 2019-01-14 DIAGNOSIS — M9904 Segmental and somatic dysfunction of sacral region: Secondary | ICD-10-CM | POA: Diagnosis not present

## 2019-01-16 DIAGNOSIS — M9905 Segmental and somatic dysfunction of pelvic region: Secondary | ICD-10-CM | POA: Diagnosis not present

## 2019-01-16 DIAGNOSIS — M5136 Other intervertebral disc degeneration, lumbar region: Secondary | ICD-10-CM | POA: Diagnosis not present

## 2019-01-16 DIAGNOSIS — M9904 Segmental and somatic dysfunction of sacral region: Secondary | ICD-10-CM | POA: Diagnosis not present

## 2019-01-16 DIAGNOSIS — M9903 Segmental and somatic dysfunction of lumbar region: Secondary | ICD-10-CM | POA: Diagnosis not present

## 2019-01-18 ENCOUNTER — Telehealth: Payer: Self-pay | Admitting: Family Medicine

## 2019-01-18 NOTE — Telephone Encounter (Signed)
Pt calling to check on this.  States she doesn't understand why it is too soon for refills - states she only has a couple of days left.

## 2019-01-21 DIAGNOSIS — M9904 Segmental and somatic dysfunction of sacral region: Secondary | ICD-10-CM | POA: Diagnosis not present

## 2019-01-21 DIAGNOSIS — M9905 Segmental and somatic dysfunction of pelvic region: Secondary | ICD-10-CM | POA: Diagnosis not present

## 2019-01-21 DIAGNOSIS — M9903 Segmental and somatic dysfunction of lumbar region: Secondary | ICD-10-CM | POA: Diagnosis not present

## 2019-01-21 DIAGNOSIS — M5136 Other intervertebral disc degeneration, lumbar region: Secondary | ICD-10-CM | POA: Diagnosis not present

## 2019-01-21 NOTE — Telephone Encounter (Signed)
90 day supply was sent in on 12/26/2018.

## 2019-01-21 NOTE — Telephone Encounter (Signed)
ATC, Unable to leave a voicemail.

## 2019-02-05 DIAGNOSIS — M9904 Segmental and somatic dysfunction of sacral region: Secondary | ICD-10-CM | POA: Diagnosis not present

## 2019-02-05 DIAGNOSIS — M9903 Segmental and somatic dysfunction of lumbar region: Secondary | ICD-10-CM | POA: Diagnosis not present

## 2019-02-05 DIAGNOSIS — M5136 Other intervertebral disc degeneration, lumbar region: Secondary | ICD-10-CM | POA: Diagnosis not present

## 2019-02-05 DIAGNOSIS — M9905 Segmental and somatic dysfunction of pelvic region: Secondary | ICD-10-CM | POA: Diagnosis not present

## 2019-02-06 ENCOUNTER — Ambulatory Visit (INDEPENDENT_AMBULATORY_CARE_PROVIDER_SITE_OTHER): Payer: PPO | Admitting: Family Medicine

## 2019-02-06 ENCOUNTER — Encounter: Payer: Medicare Other | Admitting: Internal Medicine

## 2019-02-06 ENCOUNTER — Encounter: Payer: Self-pay | Admitting: Family Medicine

## 2019-02-06 VITALS — BP 180/70 | HR 90 | Temp 97.8°F | Ht 65.0 in | Wt 247.4 lb

## 2019-02-06 DIAGNOSIS — K219 Gastro-esophageal reflux disease without esophagitis: Secondary | ICD-10-CM | POA: Diagnosis not present

## 2019-02-06 DIAGNOSIS — R32 Unspecified urinary incontinence: Secondary | ICD-10-CM | POA: Diagnosis not present

## 2019-02-06 DIAGNOSIS — E7800 Pure hypercholesterolemia, unspecified: Secondary | ICD-10-CM | POA: Diagnosis not present

## 2019-02-06 DIAGNOSIS — N184 Chronic kidney disease, stage 4 (severe): Secondary | ICD-10-CM

## 2019-02-06 DIAGNOSIS — J4520 Mild intermittent asthma, uncomplicated: Secondary | ICD-10-CM | POA: Diagnosis not present

## 2019-02-06 DIAGNOSIS — E039 Hypothyroidism, unspecified: Secondary | ICD-10-CM

## 2019-02-06 DIAGNOSIS — I10 Essential (primary) hypertension: Secondary | ICD-10-CM

## 2019-02-06 DIAGNOSIS — N170 Acute kidney failure with tubular necrosis: Secondary | ICD-10-CM | POA: Diagnosis not present

## 2019-02-06 LAB — COMPREHENSIVE METABOLIC PANEL
ALT: 16 U/L (ref 0–35)
AST: 19 U/L (ref 0–37)
Albumin: 3.7 g/dL (ref 3.5–5.2)
Alkaline Phosphatase: 141 U/L — ABNORMAL HIGH (ref 39–117)
BUN: 24 mg/dL — ABNORMAL HIGH (ref 6–23)
CO2: 27 mEq/L (ref 19–32)
Calcium: 10.5 mg/dL (ref 8.4–10.5)
Chloride: 105 mEq/L (ref 96–112)
Creatinine, Ser: 1.43 mg/dL — ABNORMAL HIGH (ref 0.40–1.20)
GFR: 35.19 mL/min — ABNORMAL LOW (ref >60.00)
Glucose, Bld: 94 mg/dL (ref 70–99)
Potassium: 4.5 mEq/L (ref 3.5–5.1)
Sodium: 141 mEq/L (ref 135–145)
TOTAL PROTEIN: 6 g/dL (ref 6.0–8.3)
Total Bilirubin: 0.6 mg/dL (ref 0.2–1.2)

## 2019-02-06 LAB — CBC WITH DIFFERENTIAL/PLATELET
Basophils Absolute: 0 10*3/uL (ref 0.0–0.1)
Basophils Relative: 0.6 % (ref 0.0–3.0)
Eosinophils Absolute: 0.2 10*3/uL (ref 0.0–0.7)
Eosinophils Relative: 3.4 % (ref 0.0–5.0)
HCT: 38.1 % (ref 36.0–46.0)
Hemoglobin: 12.4 g/dL (ref 12.0–15.0)
Lymphocytes Relative: 26.2 % (ref 12.0–46.0)
Lymphs Abs: 1.6 10*3/uL (ref 0.7–4.0)
MCHC: 32.4 g/dL (ref 30.0–36.0)
MCV: 85 fl (ref 78.0–100.0)
Monocytes Absolute: 0.5 10*3/uL (ref 0.1–1.0)
Monocytes Relative: 8 % (ref 3.0–12.0)
Neutro Abs: 3.8 10*3/uL (ref 1.4–7.7)
Neutrophils Relative %: 61.8 % (ref 43.0–77.0)
Platelets: 198 10*3/uL (ref 150.0–400.0)
RBC: 4.49 Mil/uL (ref 3.87–5.11)
RDW: 16.5 % — ABNORMAL HIGH (ref 11.5–15.5)
WBC: 6.1 10*3/uL (ref 4.0–10.5)

## 2019-02-06 LAB — LIPID PANEL
Cholesterol: 207 mg/dL — ABNORMAL HIGH (ref 0–200)
HDL: 59.2 mg/dL (ref >39.00)
LDL CALC: 114 mg/dL — AB (ref 0–99)
NonHDL: 147.56
Total CHOL/HDL Ratio: 3
Triglycerides: 168 mg/dL — ABNORMAL HIGH (ref 0.0–149.0)
VLDL: 33.6 mg/dL (ref 0.0–40.0)

## 2019-02-06 LAB — TSH: TSH: 11.78 u[IU]/mL — ABNORMAL HIGH (ref 0.35–4.50)

## 2019-02-06 MED ORDER — MIRABEGRON ER 25 MG PO TB24: ORAL_TABLET | ORAL | 1 refills | Status: DC

## 2019-02-06 MED ORDER — PREDNISONE 20 MG PO TABS: ORAL_TABLET | ORAL | 0 refills | Status: DC

## 2019-02-06 NOTE — Progress Notes (Signed)
[name] DOB: 1935/05/06 Encounter date: 02/06/2019  This is a 84 y.o. female who presents for complete physical but has some concerns today that she would like addressed:  History of present illness/Additional concerns:  Has had a cough coming and going for a couple of weeks. Not productive. Does have runny nose.   Would like a rx for leaky bladder. Thought she was on something. Gets up in the morning and leaks just getting to toilet. Has to use the poise pads at least twice daily. Does feel like she empties bladder completely. Usually does ok if she is able to get to restroom right when she gets urge. Does drink a lot of water.   Has a lot of pain in first three digits on R hand in the morning. This started bothering her about 6 weeks ago. No known injury or change in use. Had this before years ago and states that there was suggestion for some nerve operation on neck. Fingers are little tingly through day, but not painful.   Needs new rx for handicap parking. Hers expires in July. Uses this due to limited walking due to shortness of breath.   HTN: last visit was slightly elevated; checks regularly at home. On amlodipine '5mg'$ . Has not been checking her blood pressures at home.   HL: on zocor and no problems with this.  Hypothyroid:due for recheck thyroid. Asthma:stable. GERD: sx controlled with nexium.  Past Medical History:  Diagnosis Date  . Anxiety   . ASTHMA 06/18/2007  . COLONIC POLYPS, HX OF 11/06/2009  . Cough   . Depression   . DYSPNEA ON EXERTION 05/06/2008  . GERD 06/18/2007  . Goiter   . HYPERCHOLESTEROLEMIA, PURE 07/09/2007  . HYPERTENSION 06/18/2007  . HYPOTHYROIDISM 06/18/2007  . Insomnia   . NEPHROLITHIASIS, HX OF 08/07/2008  . Obesity   . OSTEOARTHRITIS 05/07/2008  . Renal cyst   . RENAL INSUFFICIENCY 05/06/2008   Past Surgical History:  Procedure Laterality Date  . ABDOMINAL HYSTERECTOMY    . CARPAL TUNNEL RELEASE    . CATARACT EXTRACTION    . JOINT  REPLACEMENT    . KNEE ARTHROSCOPY    . LITHOTRIPSY    . NASAL SINUS SURGERY  1990  . SINUS ENDO W/FUSION Right 11/06/2015   Procedure: ENDOSCOPIC SINUS SURGERY WITH NAVIGATION;  Surgeon: Melissa Montane, MD;  Location: [facility] OR;  Service: ENT;  Laterality: Right;  Endoscopic sinus surgery with Fusion protocol, right frontal and ethmoid sinusotomy   . TONSILLECTOMY    . TOTAL KNEE ARTHROPLASTY     No Known Allergies Current Meds  Medication Sig  . albuterol (PROVENTIL HFA;VENTOLIN HFA) 108 (90 BASE) MCG/ACT inhaler Inhale 2 puffs into the lungs every 6 (six) hours as needed for wheezing or shortness of breath.  [name] Kitchen amLODipine (NORVASC) 5 MG tablet TAKE 1 TABLET BY MOUTH EVERY DAY  . benzonatate (TESSALON) 200 MG capsule TAKE 1 CAPSULE BY MOUTH THREE TIMES DAILY AS NEEDED FOR COUGH.  . DULoxetine (CYMBALTA) 30 MG capsule TAKE ONE CAPSULE BY MOUTH EVERY DAY  . esomeprazole (NEXIUM) 40 MG capsule Take 1 capsule (40 mg total) by mouth daily before breakfast.  . fluticasone (FLONASE) 50 MCG/ACT nasal spray Place 2 sprays into both nostrils daily.  . furosemide (LASIX) 40 MG tablet TK 1 T PO D PRF LEG SWELLING  . hydrOXYzine (ATARAX/VISTARIL) 25 MG tablet Take 1 tablet (25 mg total) by mouth 3 (three) times daily as needed.  [name] Kitchen levothyroxine (SYNTHROID, LEVOTHROID) 125 MCG  tablet TAKE 1 TABLET(125 MCG) BY MOUTH DAILY  . montelukast (SINGULAIR) 10 MG tablet TAKE 1 TABLET BY MOUTH EVERY DAY  . Multiple Vitamin (MULTIVITAMIN) tablet Take 1 tablet by mouth daily.  . simvastatin (ZOCOR) 20 MG tablet TAKE 1 TABLET BY MOUTH AT BEDTIME  . traMADol (ULTRAM) 50 MG tablet TAKE 1 TABLET BY MOUTH EVERY 6 HOURS AS NEEDED FOR MODERATE PAIN  . [DISCONTINUED] tolterodine (DETROL LA) 4 MG 24 hr capsule TAKE 1 CAPSULE(4 MG) BY MOUTH DAILY   Social History   Tobacco Use  . Smoking status: Former Smoker    Packs/day: 0.25    Years: 6.00    Pack years: 1.50    Types: Cigarettes    Last attempt to quit: 12/19/1978    Years  since quitting: 40.1  . Smokeless tobacco: Never Used  Substance Use Topics  . Alcohol use: Yes    Alcohol/week: 0.0 standard drinks    Comment: "1 glass wine once or twice per month" - 04/09/15   Family History  Problem Relation Age of Onset  . Heart Problems Father   . Multiple myeloma Other   . Colon cancer Other   . Hypertension Neg Hx        family  . Cancer Neg Hx        colon ca , prostate ca     Review of Systems  Constitutional: Negative for chills and fever.  HENT: Positive for rhinorrhea. Negative for congestion and postnasal drip.   Respiratory: Positive for cough. Negative for chest tightness and shortness of breath (does have baseline SOB with activity; this is stable. ).   Genitourinary: Positive for frequency. Negative for difficulty urinating and dysuria.  Musculoskeletal: Positive for arthralgias (knees, hands) and gait problem. Negative for neck pain and neck stiffness.    CBC:  Lab Results  Component Value Date   WBC 6.1 02/06/2019   HGB 12.4 02/06/2019   HCT 38.1 02/06/2019   MCH 29.3 12/16/2016   MCH 28.6 11/06/2015   MCHC 32.4 02/06/2019   RDW 16.5 (H) 02/06/2019   PLT 198.0 02/06/2019   MPV 7.1 12/16/2016   CMP: Lab Results  Component Value Date   NA 141 02/06/2019   K 4.5 02/06/2019   CL 105 02/06/2019   CO2 27 02/06/2019   ANIONGAP 6 10/29/2015   GLUCOSE 94 02/06/2019   GLUCOSE 99 10/27/2006   BUN 24 (H) 02/06/2019   CREATININE 1.43 (H) 02/06/2019   GFRAA 32 (L) 11/06/2015   CALCIUM 10.5 02/06/2019   PROT 6.0 02/06/2019   BILITOT 0.6 02/06/2019   ALKPHOS 141 (H) 02/06/2019   ALT 16 02/06/2019   AST 19 02/06/2019   LIPID: Lab Results  Component Value Date   CHOL 207 (H) 02/06/2019   TRIG 168.0 (H) 02/06/2019   TRIG 138 10/27/2006   HDL 59.20 02/06/2019   LDLCALC 114 (H) 02/06/2019    Objective:  BP (!) 180/70 (BP Location: Left Arm, Patient Position: Sitting, Cuff Size: Large)   Pulse 90   Temp 97.8 F (36.6 C) (Oral)    Ht '5\' 5"'$  (1.651 m)   Wt 247 lb 6.4 oz (112.2 kg)   SpO2 94%   BMI 41.17 kg/m   Weight: 247 lb 6.4 oz (112.2 kg)   BP Readings from Last 3 Encounters:  02/06/19 (!) 180/70  12/21/18 (!) 142/84  10/12/18 (!) 150/70   Wt Readings from Last 3 Encounters:  02/06/19 247 lb 6.4 oz (112.2 kg)  12/21/18 242  lb 2 oz (109.8 kg)  10/12/18 239 lb 12.8 oz (108.8 kg)    Physical Exam Constitutional:      General: She is not in acute distress.    Appearance: She is well-developed.  HENT:     Right Ear: Tympanic membrane, ear canal and external ear normal.     Left Ear: Tympanic membrane, ear canal and external ear normal.     Mouth/Throat:     Mouth: Mucous membranes are moist.     Pharynx: Oropharynx is clear. No oropharyngeal exudate or posterior oropharyngeal erythema.  Cardiovascular:     Rate and Rhythm: Normal rate and regular rhythm.     Heart sounds: Normal heart sounds. No murmur. No friction rub.  Pulmonary:     Effort: Pulmonary effort is normal. No respiratory distress.     Breath sounds: Normal breath sounds. No wheezing or rales.  Musculoskeletal:     Right lower leg: No edema.     Left lower leg: No edema.     Comments: No limitation in cervical ROM; no pain or reproduction of sx within hand with axial loading or changes in neck position.   Neurological:     Mental Status: She is alert and oriented to person, place, and time.     Deep Tendon Reflexes:     Reflex Scores:      Tricep reflexes are 2+ on the right side and 2+ on the left side.      Bicep reflexes are 2+ on the right side and 2+ on the left side.      Brachioradialis reflexes are 2+ on the right side and 2+ on the left side.    Comments: Normal strength bilat hands; negative phalens  Psychiatric:        Behavior: Behavior normal.     Assessment/Plan: There are no preventive care reminders to display for this patient. Health Maintenance reviewed. 1. Urinary incontinence, unspecified type On max dose  detrol LA: will try myrbetriq. Although written before, insurance did not previously cover this medication. - Urinalysis with Reflex Microscopic  2. Essential hypertension Elevated. I have encouraged her to check at home. She will report back readings to [place] when we call with bloodwork results. Number today was unusually high for her. She typically has lower bp when checking in past so we will get broader picture before adjusting medications. - CBC with Differential/Platelet - Comprehensive metabolic panel  3. Mild intermittent asthma without complication Stable. Continue inhaler as needed.  4. Gastroesophageal reflux disease, esophagitis presence not specified Stable as long as she continues nexium.   5. Acquired hypothyroidism Will recheck bloodwork. She is curently on 158mg daily of synthroid. - TSH  6. Acute renal failure with acute tubular necrosis superimposed on stage 4 chronic kidney disease ([place] neph[place]. Will recheck baseline bloodwork today.  7. HYPERCHOLESTEROLEMIA, PURE On zocor '20mg'$  daily. - Lipid panel - Comprehensive metabolic panel  Return in about 3 months (around 05/07/2019) for physical exam. I have asked her to update me with blood pressures in meanwhile as well as urinary response to myrbetriq.  JMicheline Rough MD

## 2019-02-06 NOTE — Patient Instructions (Addendum)
We will try the myrbetriq instead of the tolteradine. Let me know if any problems with medication or if too expensive.   Please check blood pressures at home and report back to me when we call with lab results.

## 2019-02-12 DIAGNOSIS — M9903 Segmental and somatic dysfunction of lumbar region: Secondary | ICD-10-CM | POA: Diagnosis not present

## 2019-02-12 DIAGNOSIS — M5136 Other intervertebral disc degeneration, lumbar region: Secondary | ICD-10-CM | POA: Diagnosis not present

## 2019-02-12 DIAGNOSIS — M9904 Segmental and somatic dysfunction of sacral region: Secondary | ICD-10-CM | POA: Diagnosis not present

## 2019-02-12 DIAGNOSIS — M9905 Segmental and somatic dysfunction of pelvic region: Secondary | ICD-10-CM | POA: Diagnosis not present

## 2019-02-13 ENCOUNTER — Other Ambulatory Visit: Payer: Self-pay | Admitting: Family Medicine

## 2019-02-13 DIAGNOSIS — H5213 Myopia, bilateral: Secondary | ICD-10-CM | POA: Diagnosis not present

## 2019-02-13 DIAGNOSIS — H52203 Unspecified astigmatism, bilateral: Secondary | ICD-10-CM | POA: Diagnosis not present

## 2019-02-13 DIAGNOSIS — H524 Presbyopia: Secondary | ICD-10-CM | POA: Diagnosis not present

## 2019-02-13 DIAGNOSIS — Z961 Presence of intraocular lens: Secondary | ICD-10-CM | POA: Diagnosis not present

## 2019-02-26 ENCOUNTER — Telehealth: Payer: Self-pay | Admitting: Family Medicine

## 2019-02-26 ENCOUNTER — Other Ambulatory Visit: Payer: Self-pay | Admitting: Internal Medicine

## 2019-02-26 ENCOUNTER — Other Ambulatory Visit: Payer: Self-pay | Admitting: Family Medicine

## 2019-02-26 NOTE — Telephone Encounter (Signed)
rec'd phone call from pt.  Reviewed result notes per Dr. Ethlyn Gallery on 02/06/19.  Verb. Understanding.  Does drink water in good amts.  Stated she has been taking Levothyroxine 125 mcg. daily, and takes this 30 min. prior to eating.  Stated she does drink water and sometimes coffee, prior to the 30 min. Time frame is over.  Inquired about Hbg A1C; advised this was not checked in last lab draw.  Stated she would like to have this done.  Will make Dr. Ethlyn Gallery aware. (documented in TE as result notes no longer available in results folder)

## 2019-02-27 ENCOUNTER — Other Ambulatory Visit: Payer: Self-pay | Admitting: Family Medicine

## 2019-02-27 DIAGNOSIS — E039 Hypothyroidism, unspecified: Secondary | ICD-10-CM

## 2019-02-27 MED ORDER — LEVOTHYROXINE SODIUM 137 MCG PO TABS: 137.0000 ug | ORAL_TABLET | Freq: Every day | ORAL | 1 refills | Status: DC

## 2019-02-27 NOTE — Telephone Encounter (Signed)
Discussed notes per Dr. Ethlyn Gallery. Patient expressed understanding.

## 2019-02-27 NOTE — Telephone Encounter (Signed)
I have sent in increased synthroid dose for her (147mcg) to take daily. We can get a recheck of thyroid labwork when she is here for next appointment. We can discuss A1C at next appointment as well. Not sure if she has specific concern about this but last 2 sugars have been normal and she has never had A1C in our system. (follow up suggested in May from my last note)

## 2019-04-23 ENCOUNTER — Other Ambulatory Visit: Payer: Self-pay | Admitting: Family Medicine

## 2019-04-24 ENCOUNTER — Ambulatory Visit: Payer: PPO | Admitting: Family Medicine

## 2019-05-06 ENCOUNTER — Other Ambulatory Visit: Payer: Self-pay | Admitting: Family Medicine

## 2019-05-14 ENCOUNTER — Other Ambulatory Visit: Payer: Self-pay | Admitting: Family Medicine

## 2019-05-16 ENCOUNTER — Other Ambulatory Visit: Payer: Self-pay | Admitting: Family Medicine

## 2019-05-20 ENCOUNTER — Other Ambulatory Visit: Payer: Self-pay

## 2019-05-20 ENCOUNTER — Other Ambulatory Visit: Payer: Self-pay | Admitting: Family Medicine

## 2019-05-20 ENCOUNTER — Encounter: Payer: Self-pay | Admitting: Family Medicine

## 2019-05-20 ENCOUNTER — Ambulatory Visit (INDEPENDENT_AMBULATORY_CARE_PROVIDER_SITE_OTHER): Payer: PPO | Admitting: Family Medicine

## 2019-05-20 VITALS — BP 170/68 | HR 95 | Temp 97.8°F | Wt 248.5 lb

## 2019-05-20 DIAGNOSIS — R0789 Other chest pain: Secondary | ICD-10-CM | POA: Diagnosis not present

## 2019-05-20 NOTE — Progress Notes (Signed)
   Subjective:    Patient ID: [name], female    DOB: 1935/02/03, 84 y.o.   MRN: [id number]  HPI Here for a right sided chest wall pain that started 4 days ago. No recent trauma. No SOB or cough. No fever.    Review of Systems  Constitutional: Negative.   Respiratory: Negative.   Cardiovascular: Positive for chest pain. Negative for palpitations and leg swelling.  Gastrointestinal: Negative.   Neurological: Negative.        Objective:   Physical Exam Constitutional:      General: She is not in acute distress.    Appearance: She is well-developed.  Cardiovascular:     Rate and Rhythm: Normal rate and regular rhythm.     Pulses: Normal pulses.     Heart sounds: Normal heart sounds.  Pulmonary:     Effort: Pulmonary effort is normal.     Breath sounds: Normal breath sounds.  Abdominal:     General: Abdomen is flat. Bowel sounds are normal. There is no distension.     Palpations: Abdomen is soft. There is no mass.     Tenderness: There is no abdominal tenderness. There is no guarding or rebound.     Hernia: No hernia is present.  Musculoskeletal:     Comments: She is tender over the right lateral ribs just below the axilla. No crepitus. No bruising.   Neurological:     Mental Status: She is alert.           Assessment & Plan:  This is chest wall pain, and I reassured her this is benign. This will likely slowly heal and fade away over the next 4 weeks. Use Ibuprofen prn.  Alysia Penna, MD

## 2019-05-23 ENCOUNTER — Other Ambulatory Visit: Payer: Self-pay | Admitting: *Deleted

## 2019-05-23 MED ORDER — SIMVASTATIN 20 MG PO TABS: 20.0000 mg | ORAL_TABLET | Freq: Every day | ORAL | 1 refills | Status: DC

## 2019-05-23 MED ORDER — MIRABEGRON ER 25 MG PO TB24: ORAL_TABLET | ORAL | 1 refills | Status: DC

## 2019-05-23 NOTE — Telephone Encounter (Signed)
Rx done. 

## 2019-06-01 ENCOUNTER — Other Ambulatory Visit: Payer: Self-pay | Admitting: Family Medicine

## 2019-06-13 DIAGNOSIS — R600 Localized edema: Secondary | ICD-10-CM | POA: Diagnosis not present

## 2019-06-13 DIAGNOSIS — N183 Chronic kidney disease, stage 3 (moderate): Secondary | ICD-10-CM | POA: Diagnosis not present

## 2019-06-13 DIAGNOSIS — E213 Hyperparathyroidism, unspecified: Secondary | ICD-10-CM | POA: Diagnosis not present

## 2019-06-13 DIAGNOSIS — I129 Hypertensive chronic kidney disease with stage 1 through stage 4 chronic kidney disease, or unspecified chronic kidney disease: Secondary | ICD-10-CM | POA: Diagnosis not present

## 2019-06-13 DIAGNOSIS — N2581 Secondary hyperparathyroidism of renal origin: Secondary | ICD-10-CM | POA: Diagnosis not present

## 2019-06-13 DIAGNOSIS — E669 Obesity, unspecified: Secondary | ICD-10-CM | POA: Diagnosis not present

## 2019-06-13 DIAGNOSIS — E785 Hyperlipidemia, unspecified: Secondary | ICD-10-CM | POA: Diagnosis not present

## 2019-06-14 DIAGNOSIS — N183 Chronic kidney disease, stage 3 (moderate): Secondary | ICD-10-CM | POA: Diagnosis not present

## 2019-07-24 ENCOUNTER — Encounter: Payer: Self-pay | Admitting: Family Medicine

## 2019-07-24 ENCOUNTER — Other Ambulatory Visit: Payer: Self-pay

## 2019-07-24 ENCOUNTER — Ambulatory Visit (INDEPENDENT_AMBULATORY_CARE_PROVIDER_SITE_OTHER): Payer: PPO | Admitting: Family Medicine

## 2019-07-24 VITALS — BP 160/90 | HR 95 | Temp 98.7°F | Ht 65.0 in | Wt 243.9 lb

## 2019-07-24 DIAGNOSIS — I10 Essential (primary) hypertension: Secondary | ICD-10-CM

## 2019-07-24 DIAGNOSIS — E039 Hypothyroidism, unspecified: Secondary | ICD-10-CM

## 2019-07-24 DIAGNOSIS — J4520 Mild intermittent asthma, uncomplicated: Secondary | ICD-10-CM

## 2019-07-24 DIAGNOSIS — G5600 Carpal tunnel syndrome, unspecified upper limb: Secondary | ICD-10-CM

## 2019-07-24 LAB — COMPREHENSIVE METABOLIC PANEL
ALT: 16 U/L (ref 0–35)
AST: 19 U/L (ref 0–37)
Albumin: 3.7 g/dL (ref 3.5–5.2)
Alkaline Phosphatase: 133 U/L — ABNORMAL HIGH (ref 39–117)
BUN: 14 mg/dL (ref 6–23)
CO2: 28 mEq/L (ref 19–32)
Calcium: 10.4 mg/dL (ref 8.4–10.5)
Chloride: 106 mEq/L (ref 96–112)
Creatinine, Ser: 1.44 mg/dL — ABNORMAL HIGH (ref 0.40–1.20)
GFR: 34.86 mL/min — ABNORMAL LOW (ref >60.00)
Glucose, Bld: 97 mg/dL (ref 70–99)
Potassium: 5.1 mEq/L (ref 3.5–5.1)
Sodium: 140 mEq/L (ref 135–145)
Total Bilirubin: 0.4 mg/dL (ref 0.2–1.2)
Total Protein: 5.9 g/dL — ABNORMAL LOW (ref 6.0–8.3)

## 2019-07-24 LAB — CBC WITH DIFFERENTIAL/PLATELET
Basophils Absolute: 0 10*3/uL (ref 0.0–0.1)
Basophils Relative: 0.5 % (ref 0.0–3.0)
Eosinophils Absolute: 0.3 10*3/uL (ref 0.0–0.7)
Eosinophils Relative: 5 % (ref 0.0–5.0)
HCT: 41.8 % (ref 36.0–46.0)
Hemoglobin: 13.4 g/dL (ref 12.0–15.0)
Lymphocytes Relative: 26.6 % (ref 12.0–46.0)
Lymphs Abs: 1.6 10*3/uL (ref 0.7–4.0)
MCHC: 32 g/dL (ref 30.0–36.0)
MCV: 85.1 fl (ref 78.0–100.0)
Monocytes Absolute: 0.4 10*3/uL (ref 0.1–1.0)
Monocytes Relative: 6.1 % (ref 3.0–12.0)
Neutro Abs: 3.7 10*3/uL (ref 1.4–7.7)
Neutrophils Relative %: 61.8 % (ref 43.0–77.0)
Platelets: 219 10*3/uL (ref 150.0–400.0)
RBC: 4.91 Mil/uL (ref 3.87–5.11)
RDW: 16.8 % — ABNORMAL HIGH (ref 11.5–15.5)
WBC: 6 10*3/uL (ref 4.0–10.5)

## 2019-07-24 LAB — TSH: TSH: 3.18 u[IU]/mL (ref 0.35–4.50)

## 2019-07-24 MED ORDER — SHINGRIX 50 MCG/0.5ML IM SUSR: 0.5000 mL | Freq: Once | INTRAMUSCULAR | 0 refills | Status: DC

## 2019-07-24 MED ORDER — SHINGRIX 50 MCG/0.5ML IM SUSR: 0.5000 mL | Freq: Once | INTRAMUSCULAR | 0 refills | Status: AC

## 2019-07-24 NOTE — Progress Notes (Signed)
[name] DOB: 22-Jun-1935 Encounter date: 07/24/2019  This is a 84 y.o. female who presents with Chief Complaint  Patient presents with  . Follow-up    History of present illness: She is doing ok with managing and staying away from public with COVID situation.   HTN: Has not been regularly checking at home.  She is tolerating amlodipine 5 mg without difficulty.  Denies any chest pain, chest pressure, or lower extremity edema.  Mild intermittent asthma: Breathing has been ok. Still gets short of breath. Does try to walk regularly. Has actually not used rescue inhaler more than once in last couple of weeks.   GERD: Stable on Nexium.  Hypothyroid: She is taking her Synthroid in the morning on an empty stomach (137 mcg daily).  She is due for recheck of TSH since she was previously undertreated.  ARF following with nephrology regularly.   Has been itching a lot. Scalp is what is itching most. Hydroxyzine does help her.   Has numbness in first, second, third fingers right hand. Not bothering her too much; just there. No noted weakness in hands; pins and needles. There all the time.   myrbetriq has helped quite a lot. Able to get to toilet now.   No Known Allergies Current Meds  Medication Sig  . albuterol (PROVENTIL HFA;VENTOLIN HFA) 108 (90 BASE) MCG/ACT inhaler Inhale 2 puffs into the lungs every 6 (six) hours as needed for wheezing or shortness of breath.  [name] Kitchen amLODipine (NORVASC) 5 MG tablet TAKE 1 TABLET BY MOUTH EVERY DAY  . DULoxetine (CYMBALTA) 30 MG capsule TAKE 1 CAPSULE BY MOUTH EVERY DAY  . esomeprazole (NEXIUM) 40 MG capsule Take 1 capsule (40 mg total) by mouth daily before breakfast.  . fluticasone (FLONASE) 50 MCG/ACT nasal spray Place 2 sprays into both nostrils daily.  . furosemide (LASIX) 40 MG tablet TK 1 T PO D PRF LEG SWELLING  . hydrOXYzine (ATARAX/VISTARIL) 25 MG tablet Take 1 tablet (25 mg total) by mouth 3 (three) times daily as needed.  [name] Kitchen levothyroxine  (SYNTHROID) 137 MCG tablet Take 1 tablet (137 mcg total) by mouth daily before breakfast.  . mirabegron ER (MYRBETRIQ) 25 MG TB24 tablet Start with 25mg  once daily; increase to 2 tablets (50mg ) daily in 1 weeks time if incontinence persists  . montelukast (SINGULAIR) 10 MG tablet TAKE 1 TABLET BY MOUTH EVERY DAY  . Multiple Vitamin (MULTIVITAMIN) tablet Take 1 tablet by mouth daily.  . simvastatin (ZOCOR) 20 MG tablet Take 1 tablet (20 mg total) by mouth at bedtime.  . traMADol (ULTRAM) 50 MG tablet TAKE 1 TABLET BY MOUTH EVERY 6 HOURS AS NEEDED FOR MODERATE PAIN  . [DISCONTINUED] benzonatate (TESSALON) 200 MG capsule TAKE 1 CAPSULE BY MOUTH THREE TIMES DAILY AS NEEDED FOR COUGH.  . [DISCONTINUED] predniSONE (DELTASONE) 20 MG tablet Take 3 tablets daily x 3 days, then 2 tablets for 3 days, then 1 tablet daily x 3 days, then 1/2 tablet daily x 2 days.    Review of Systems  Constitutional: Negative for chills, fatigue and fever.  Respiratory: Negative for cough, chest tightness, shortness of breath and wheezing.   Cardiovascular: Negative for chest pain, palpitations and leg swelling.    Objective:  BP (!) 160/90 (BP Location: Left Arm, Patient Position: Sitting, Cuff Size: Large)   Pulse 95   Temp 98.7 F (37.1 C) (Temporal)   Ht 5\' 5"  (1.651 m)   Wt 243 lb 14.4 oz (110.6 kg)   SpO2 95%  BMI 40.59 kg/m   Weight: 243 lb 14.4 oz (110.6 kg)   BP Readings from Last 3 Encounters:  07/24/19 (!) 160/90  05/20/19 (!) 170/68  02/06/19 (!) 180/70   Wt Readings from Last 3 Encounters:  07/24/19 243 lb 14.4 oz (110.6 kg)  05/20/19 248 lb 8 oz (112.7 kg)  02/06/19 247 lb 6.4 oz (112.2 kg)   Recheck bp the same in office.   Physical Exam Constitutional:      General: She is not in acute distress.    Appearance: She is well-developed.  Cardiovascular:     Rate and Rhythm: Normal rate and regular rhythm.     Heart sounds: Normal heart sounds. No murmur. No friction rub.  Pulmonary:      Effort: Pulmonary effort is normal. No respiratory distress.     Breath sounds: Normal breath sounds. No wheezing or rales.  Musculoskeletal:     Right lower leg: No edema.     Left lower leg: No edema.  Neurological:     Mental Status: She is alert and oriented to person, place, and time.     Motor: No weakness.     Deep Tendon Reflexes:     Reflex Scores:      Tricep reflexes are 2+ on the right side and 2+ on the left side.      Bicep reflexes are 2+ on the right side and 2+ on the left side.      Brachioradialis reflexes are 2+ on the right side and 2+ on the left side.    Comments: No pain with axial loading pressures to head.  Psychiatric:        Behavior: Behavior normal.     Assessment/Plan  1. Essential hypertension Increase amlodipine to 7.5 mg daily (1-1/2 tablets).  Warned that this could cause increased lower extremity edema.  We will have her to virtual follow-up with me and start checking blood pressures at home on a daily basis in about 3 weeks time. - CBC with Differential/Platelet; Future - Comprehensive metabolic panel; Future - Comprehensive metabolic panel - CBC with Differential/Platelet  2. Mild intermittent asthma without complication Breathing has been stable.  She is rarely using her rescue inhaler.  3. Acquired hypothyroidism Recheck blood work today.  May need Synthroid dose.  Undertreated hypothyroid may be contributing to itchiness that she is feeling. - TSH; Future - TSH  4.  Median nerve compression: She is not interested in seeing surgical specialist at this time.  I do feel that the numbness in the first 3 fingers of the right hand is related to carpal tunnel compression.  She is going to try a carpal tunnel splint day and night for the next month and see if this helps with symptoms.  If any worsening, or if she desires to see a surgical specialist we will refer her.   Return in about 3 weeks (around 08/14/2019), or virtual for bp  followup.  Micheline Rough, MD

## 2019-07-24 NOTE — Patient Instructions (Addendum)
Try over the counter flonase in each nostril daily.   Increase amlodipine to 7.5mg  (1.5 tablets) daily.   Carpal tunnel splint for a month to see how right wrist feels. Let me know if you want to see surgeon.

## 2019-07-27 ENCOUNTER — Telehealth: Payer: Self-pay | Admitting: Family Medicine

## 2019-07-30 NOTE — Telephone Encounter (Signed)
See note

## 2019-07-30 NOTE — Telephone Encounter (Signed)
Patient called in and is wanting these medications refilled as she has been taking them for some time. States no one if office mentioned anything about these medications when she had her appointment last week. Please advise and call back.

## 2019-07-30 NOTE — Telephone Encounter (Signed)
I called the pt and informed her a 90-day supply for both medications was sent and received on 7/9 by Dr Ethlyn Gallery.  Patient agreed to call the pharmacy.

## 2019-08-14 ENCOUNTER — Telehealth: Payer: Self-pay

## 2019-08-14 NOTE — Telephone Encounter (Signed)
Copied from [phone] [id number]. Topic: General - Inquiry >> Aug 14, 2019  3:57 PM Richardo Priest, NT wrote: Reason for CRM: Patient called I stating she is wanting to have permission to join a weight loss plan with daughter in law called, [employer]. Patient just wants overall permission, states needs nothing in writing. It is a lean and green diet. Please advise and call back [phone].

## 2019-08-14 NOTE — Telephone Encounter (Signed)
I tried to look up this plan and it is hard to find specifics (at least online) for meals.   I do think that a healthy diet and smaller meals, portion control is a great idea (this is basis of their plan). I worry, however, what all is in their prepackaged foods as I cannot easily find this online.   For example, we have to pay attention to protein intake as too much excess protein could put strain on her kidneys. So I would just recommend using caution and checking ingredients - things like higher sodium content could raise blood pressure. It would be nice if she could get more nutrient info before purchasing these. I also think sticking with idea of smaller portions and healthy eating is easier - staying with fresh fruits, veggies and staying away from processed bars/foods. In summary I just want to make sure she knows what she is eating!  *and speaking of blood pressure - how is new dose of med doing? Has she been checking at home?

## 2019-08-15 NOTE — Telephone Encounter (Signed)
Spoke with the pt and informed her of the message below.  Patient states her blood pressure has still been high, not sure of exact numbers and stated she will continue to check at home.  Message sent to Dr Ethlyn Gallery.

## 2019-08-16 ENCOUNTER — Other Ambulatory Visit: Payer: Self-pay | Admitting: Family Medicine

## 2019-08-28 ENCOUNTER — Other Ambulatory Visit: Payer: Self-pay

## 2019-08-28 ENCOUNTER — Other Ambulatory Visit: Payer: Self-pay | Admitting: Family Medicine

## 2019-08-28 ENCOUNTER — Encounter: Payer: Self-pay | Admitting: Family Medicine

## 2019-08-28 ENCOUNTER — Ambulatory Visit: Payer: PPO | Admitting: Family Medicine

## 2019-08-28 DIAGNOSIS — Z1231 Encounter for screening mammogram for malignant neoplasm of breast: Secondary | ICD-10-CM | POA: Diagnosis not present

## 2019-08-28 LAB — HM MAMMOGRAPHY

## 2019-08-28 NOTE — Progress Notes (Signed)
Patient rescheduled appointment.

## 2019-08-31 ENCOUNTER — Other Ambulatory Visit: Payer: Self-pay | Admitting: Family Medicine

## 2019-09-11 ENCOUNTER — Other Ambulatory Visit: Payer: Self-pay | Admitting: Surgery

## 2019-09-11 DIAGNOSIS — E210 Primary hyperparathyroidism: Secondary | ICD-10-CM

## 2019-09-16 DIAGNOSIS — E210 Primary hyperparathyroidism: Secondary | ICD-10-CM | POA: Diagnosis not present

## 2019-09-18 ENCOUNTER — Ambulatory Visit: Payer: PPO | Admitting: Family Medicine

## 2019-09-18 DIAGNOSIS — E559 Vitamin D deficiency, unspecified: Secondary | ICD-10-CM

## 2019-09-18 DIAGNOSIS — J4520 Mild intermittent asthma, uncomplicated: Secondary | ICD-10-CM

## 2019-09-18 DIAGNOSIS — K219 Gastro-esophageal reflux disease without esophagitis: Secondary | ICD-10-CM

## 2019-09-18 DIAGNOSIS — E7800 Pure hypercholesterolemia, unspecified: Secondary | ICD-10-CM

## 2019-09-18 DIAGNOSIS — I10 Essential (primary) hypertension: Secondary | ICD-10-CM

## 2019-09-18 DIAGNOSIS — E039 Hypothyroidism, unspecified: Secondary | ICD-10-CM

## 2019-09-18 NOTE — Progress Notes (Signed)
No Show

## 2019-09-22 ENCOUNTER — Other Ambulatory Visit: Payer: Self-pay | Admitting: Family Medicine

## 2019-09-22 DIAGNOSIS — E039 Hypothyroidism, unspecified: Secondary | ICD-10-CM

## 2019-09-30 ENCOUNTER — Other Ambulatory Visit: Payer: Self-pay

## 2019-09-30 ENCOUNTER — Encounter (HOSPITAL_COMMUNITY)
Admission: RE | Admit: 2019-09-30 | Discharge: 2019-09-30 | Disposition: A | Discharge status: Home / Self Care | Payer: PPO | Source: Ambulatory Visit | Attending: Surgery | Admitting: Surgery

## 2019-09-30 ENCOUNTER — Ambulatory Visit (HOSPITAL_COMMUNITY)
Admission: RE | Admit: 2019-09-30 | Discharge: 2019-09-30 | Disposition: A | Discharge status: Home / Self Care | Payer: PPO | Source: Ambulatory Visit | Attending: Surgery | Admitting: Surgery

## 2019-09-30 DIAGNOSIS — E210 Primary hyperparathyroidism: Secondary | ICD-10-CM | POA: Insufficient documentation

## 2019-09-30 DIAGNOSIS — E213 Hyperparathyroidism, unspecified: Secondary | ICD-10-CM | POA: Diagnosis not present

## 2019-09-30 MED ORDER — TECHNETIUM TC 99M SESTAMIBI - CARDIOLITE
24.0000 | Freq: Once | INTRAVENOUS | Status: AC | PRN
  Administered 2019-09-30: 11:00:00 24 via INTRAVENOUS

## 2019-10-08 ENCOUNTER — Other Ambulatory Visit: Payer: Self-pay | Admitting: Surgery

## 2019-10-08 ENCOUNTER — Encounter: Payer: Self-pay | Admitting: Family Medicine

## 2019-10-08 DIAGNOSIS — E210 Primary hyperparathyroidism: Secondary | ICD-10-CM

## 2019-10-14 ENCOUNTER — Other Ambulatory Visit: Payer: PPO

## 2019-11-19 ENCOUNTER — Other Ambulatory Visit: Payer: Self-pay | Admitting: Family Medicine

## 2019-11-20 NOTE — Telephone Encounter (Signed)
Last OV 09/18/19 Last refill(s) 08/19/19 #90/0 Next OV 01/29/20

## 2019-12-05 ENCOUNTER — Other Ambulatory Visit: Payer: Self-pay | Admitting: Family Medicine

## 2019-12-17 DIAGNOSIS — E785 Hyperlipidemia, unspecified: Secondary | ICD-10-CM | POA: Diagnosis not present

## 2019-12-17 DIAGNOSIS — G5601 Carpal tunnel syndrome, right upper limb: Secondary | ICD-10-CM | POA: Diagnosis not present

## 2019-12-17 DIAGNOSIS — R600 Localized edema: Secondary | ICD-10-CM | POA: Diagnosis not present

## 2019-12-17 DIAGNOSIS — I129 Hypertensive chronic kidney disease with stage 1 through stage 4 chronic kidney disease, or unspecified chronic kidney disease: Secondary | ICD-10-CM | POA: Diagnosis not present

## 2019-12-17 DIAGNOSIS — N183 Chronic kidney disease, stage 3 unspecified: Secondary | ICD-10-CM | POA: Diagnosis not present

## 2019-12-17 DIAGNOSIS — E213 Hyperparathyroidism, unspecified: Secondary | ICD-10-CM | POA: Diagnosis not present

## 2019-12-22 ENCOUNTER — Other Ambulatory Visit: Payer: Self-pay | Admitting: Family Medicine

## 2019-12-22 DIAGNOSIS — E039 Hypothyroidism, unspecified: Secondary | ICD-10-CM

## 2019-12-23 DIAGNOSIS — N183 Chronic kidney disease, stage 3 unspecified: Secondary | ICD-10-CM | POA: Diagnosis not present

## 2019-12-25 ENCOUNTER — Encounter (HOSPITAL_BASED_OUTPATIENT_CLINIC_OR_DEPARTMENT_OTHER): Payer: Self-pay | Admitting: Orthopedic Surgery

## 2019-12-25 ENCOUNTER — Other Ambulatory Visit: Payer: Self-pay

## 2019-12-25 DIAGNOSIS — M25531 Pain in right wrist: Secondary | ICD-10-CM | POA: Diagnosis not present

## 2019-12-25 NOTE — Progress Notes (Signed)
Spoke w/ via phone for pre-op interview---Stephaine Lab needs dos----    I stat 8, ekg          Lab results------echo 10-04-16 chart/epic COVID test ------12-27-2019 Arrive at -------161 am 12-31-2019 NPO after ------midnight Medications to take morning of surgery -----montelukast, duloxetine, amlodipine, levothyroxine, nexium, mybetriq Diabetic medication -----n/a Patient Special Instructions ----- Pre-Op special Istructions ----- Patient verbalized understanding of instructions that were given at this phone interview. Patient denies shortness of breath, chest pain, fever, cough a this phone interview.

## 2019-12-26 NOTE — H&P (Signed)
[facility] [address]   [place] [address] [phone] A Division of [facility]  RE: Claudine, Stallings                                  [id number]         DOB: 04/29/1935 INITIAL EVALUATION 12/25/2019  Reason for visit:  Right wrist pain and numbness in  her fingers.    HPI: She is 85 years old.  She has had numbness in her median nerve distribution of the right hand that has been constant for the last two weeks.  It is worse at night.  She has used a brace for this without any improvement.    OBJECTIVE: The patient is a well appearing female, in no apparent distress.  Positive Phalen's and Durkan's tests.  Numbness in her median nerve distribution.    IMAGES: None today.   ASSESSMENT/PLAN:  Exam and history are very consistent with carpal tunnel syndrome of the right hand.  No symptoms on the left side.  I discussed options.  We could try cortisone injection versus endoscopic carpal tunnel release.  She would like to go forward with that.  I discussed the risks and benefits, and again she would like to go forward with that surgery.     Ernesta Amble.  Percell Miller, M.D.  Electronically verified by Ernesta Amble. Percell Miller, M.D. TDM:pmw D 12/25/19 T 12/26/19

## 2019-12-27 ENCOUNTER — Other Ambulatory Visit (HOSPITAL_COMMUNITY)
Admission: RE | Admit: 2019-12-27 | Discharge: 2019-12-27 | Disposition: A | Discharge status: Home / Self Care | Payer: PPO | Source: Ambulatory Visit | Attending: Orthopedic Surgery | Admitting: Orthopedic Surgery

## 2019-12-27 DIAGNOSIS — Z01812 Encounter for preprocedural laboratory examination: Secondary | ICD-10-CM | POA: Insufficient documentation

## 2019-12-27 DIAGNOSIS — Z20822 Contact with and (suspected) exposure to covid-19: Secondary | ICD-10-CM | POA: Diagnosis not present

## 2019-12-29 LAB — NOVEL CORONAVIRUS, NAA (HOSP ORDER, SEND-OUT TO REF LAB; TAT 18-24 HRS): SARS-CoV-2, NAA: NOT DETECTED

## 2019-12-31 ENCOUNTER — Ambulatory Visit (HOSPITAL_BASED_OUTPATIENT_CLINIC_OR_DEPARTMENT_OTHER)
Admission: RE | Admit: 2019-12-31 | Discharge: 2019-12-31 | Disposition: A | Discharge status: Home / Self Care | Payer: PPO | Attending: Orthopedic Surgery | Admitting: Orthopedic Surgery

## 2019-12-31 ENCOUNTER — Other Ambulatory Visit: Payer: Self-pay

## 2019-12-31 ENCOUNTER — Encounter (HOSPITAL_BASED_OUTPATIENT_CLINIC_OR_DEPARTMENT_OTHER)
Admission: RE | Disposition: A | Discharge status: Home / Self Care | Payer: Self-pay | Source: Home / Self Care | Attending: Orthopedic Surgery

## 2019-12-31 ENCOUNTER — Ambulatory Visit (HOSPITAL_BASED_OUTPATIENT_CLINIC_OR_DEPARTMENT_OTHER): Payer: PPO | Admitting: Anesthesiology

## 2019-12-31 ENCOUNTER — Encounter (HOSPITAL_BASED_OUTPATIENT_CLINIC_OR_DEPARTMENT_OTHER): Payer: Self-pay | Admitting: Orthopedic Surgery

## 2019-12-31 DIAGNOSIS — I10 Essential (primary) hypertension: Secondary | ICD-10-CM | POA: Diagnosis not present

## 2019-12-31 DIAGNOSIS — Z87891 Personal history of nicotine dependence: Secondary | ICD-10-CM | POA: Insufficient documentation

## 2019-12-31 DIAGNOSIS — Z6839 Body mass index (BMI) 39.0-39.9, adult: Secondary | ICD-10-CM | POA: Insufficient documentation

## 2019-12-31 DIAGNOSIS — N184 Chronic kidney disease, stage 4 (severe): Secondary | ICD-10-CM | POA: Diagnosis not present

## 2019-12-31 DIAGNOSIS — G5601 Carpal tunnel syndrome, right upper limb: Secondary | ICD-10-CM

## 2019-12-31 DIAGNOSIS — I129 Hypertensive chronic kidney disease with stage 1 through stage 4 chronic kidney disease, or unspecified chronic kidney disease: Secondary | ICD-10-CM | POA: Diagnosis not present

## 2019-12-31 DIAGNOSIS — N179 Acute kidney failure, unspecified: Secondary | ICD-10-CM | POA: Diagnosis not present

## 2019-12-31 HISTORY — DX: Unspecified asthma, uncomplicated: J45.909

## 2019-12-31 HISTORY — PX: CARPAL TUNNEL RELEASE: SHX101

## 2019-12-31 SURGERY — RELEASE, CARPAL TUNNEL, ENDOSCOPIC
Anesthesia: Monitor Anesthesia Care | Site: Hand | Laterality: Right

## 2019-12-31 MED ORDER — ONDANSETRON HCL 4 MG PO TABS: 4.0000 mg | ORAL_TABLET | Freq: Three times a day (TID) | ORAL | 0 refills | Status: DC | PRN

## 2019-12-31 MED ORDER — PROPOFOL 500 MG/50ML IV EMUL
INTRAVENOUS | Status: DC | PRN
  Administered 2019-12-31: 200 ug/kg/min via INTRAVENOUS

## 2019-12-31 MED ORDER — LIDOCAINE HCL (CARDIAC) PF 100 MG/5ML IV SOSY
PREFILLED_SYRINGE | INTRAVENOUS | Status: DC | PRN
  Administered 2019-12-31: 25 mg via INTRAVENOUS

## 2019-12-31 MED ORDER — ACETAMINOPHEN 500 MG PO TABS
ORAL_TABLET | ORAL | Status: AC
  Filled 2019-12-31: qty 2

## 2019-12-31 MED ORDER — KETOROLAC TROMETHAMINE 30 MG/ML IJ SOLN
INTRAMUSCULAR | Status: AC
  Filled 2019-12-31: qty 1

## 2019-12-31 MED ORDER — BUPIVACAINE HCL (PF) 0.5 % IJ SOLN
INTRAMUSCULAR | Status: DC | PRN
  Administered 2019-12-31: 4 mL

## 2019-12-31 MED ORDER — ONDANSETRON HCL 4 MG/2ML IJ SOLN
INTRAMUSCULAR | Status: DC | PRN
  Administered 2019-12-31: 4 mg via INTRAVENOUS

## 2019-12-31 MED ORDER — CHLORHEXIDINE GLUCONATE 4 % EX LIQD
60.0000 mL | Freq: Once | CUTANEOUS | Status: DC
  Filled 2019-12-31: qty 118

## 2019-12-31 MED ORDER — LACTATED RINGERS IV SOLN
INTRAVENOUS | Status: DC
  Filled 2019-12-31: qty 1000

## 2019-12-31 MED ORDER — KETOROLAC TROMETHAMINE 30 MG/ML IJ SOLN
INTRAMUSCULAR | Status: DC | PRN
  Administered 2019-12-31: 30 mg via INTRAVENOUS

## 2019-12-31 MED ORDER — LIDOCAINE 2% (20 MG/ML) 5 ML SYRINGE
INTRAMUSCULAR | Status: AC
  Filled 2019-12-31: qty 5

## 2019-12-31 MED ORDER — HYDROCODONE-ACETAMINOPHEN 5-325 MG PO TABS: 1.0000 | ORAL_TABLET | Freq: Four times a day (QID) | ORAL | 0 refills | Status: DC | PRN

## 2019-12-31 MED ORDER — ACETAMINOPHEN 500 MG PO TABS
1000.0000 mg | ORAL_TABLET | Freq: Once | ORAL | Status: AC
  Administered 2019-12-31: 1000 mg via ORAL
  Filled 2019-12-31: qty 2

## 2019-12-31 MED ORDER — ONDANSETRON HCL 4 MG/2ML IJ SOLN
INTRAMUSCULAR | Status: AC
  Filled 2019-12-31: qty 2

## 2019-12-31 MED ORDER — LIDOCAINE HCL 1 % IJ SOLN
INTRAMUSCULAR | Status: DC | PRN
  Administered 2019-12-31: 4 mL

## 2019-12-31 MED ORDER — CEFAZOLIN SODIUM-DEXTROSE 2-4 GM/100ML-% IV SOLN
INTRAVENOUS | Status: AC
  Filled 2019-12-31: qty 100

## 2019-12-31 MED ORDER — CEFAZOLIN SODIUM-DEXTROSE 2-4 GM/100ML-% IV SOLN
2.0000 g | INTRAVENOUS | Status: AC
  Administered 2019-12-31: 2 g via INTRAVENOUS
  Filled 2019-12-31: qty 100

## 2019-12-31 MED ORDER — PROPOFOL 500 MG/50ML IV EMUL
INTRAVENOUS | Status: AC
  Filled 2019-12-31: qty 50

## 2019-12-31 SURGICAL SUPPLY — 38 items
APL PRP STRL LF DISP 70% ISPRP (MISCELLANEOUS) ×1
ASMB BLDE STD STRL DISP ECTR (BLADE) ×1
BANDAGE ACE 3X5.8 VEL STRL LF (GAUZE/BANDAGES/DRESSINGS) ×1 IMPLANT
BLADE SLIMLINE EXTR (BLADE) ×2 IMPLANT
BLADE SURG 15 STRL LF DISP TIS (BLADE) ×1 IMPLANT
BLADE SURG 15 STRL SS (BLADE) ×2
BNDG CMPR 9X4 STRL LF SNTH (GAUZE/BANDAGES/DRESSINGS)
BNDG CMPR STD VLCR NS LF 5.8X4 (GAUZE/BANDAGES/DRESSINGS)
BNDG ELASTIC 4X5.8 VLCR NS LF (GAUZE/BANDAGES/DRESSINGS) IMPLANT
BNDG ELASTIC 4X5.8 VLCR STR LF (GAUZE/BANDAGES/DRESSINGS) ×1 IMPLANT
BNDG ESMARK 4X9 LF (GAUZE/BANDAGES/DRESSINGS) IMPLANT
CHLORAPREP W/TINT 26 (MISCELLANEOUS) ×2 IMPLANT
CORD BIPOLAR FORCEPS 12FT (ELECTRODE) IMPLANT
COVER BACK TABLE 60X90IN (DRAPES) ×2 IMPLANT
COVER WAND RF STERILE (DRAPES) IMPLANT
CUFF TOURN SGL QUICK 18X4 (TOURNIQUET CUFF) ×1 IMPLANT
DRAPE EXTREMITY T 121X128X90 (DISPOSABLE) ×2 IMPLANT
DRAPE IMP U-DRAPE 54X76 (DRAPES) ×2 IMPLANT
DRAPE SURG 17X23 STRL (DRAPES) ×1 IMPLANT
DRSG EMULSION OIL 3X3 NADH (GAUZE/BANDAGES/DRESSINGS) ×2 IMPLANT
DRSG TEGADERM 2-3/8X2-3/4 SM (GAUZE/BANDAGES/DRESSINGS) ×2 IMPLANT
GAUZE SPONGE 4X4 12PLY STRL (GAUZE/BANDAGES/DRESSINGS) ×2 IMPLANT
GLOVE BIO SURGEON STRL SZ7.5 (GLOVE) ×4 IMPLANT
GLOVE BIOGEL PI IND STRL 8 (GLOVE) ×2 IMPLANT
GLOVE BIOGEL PI INDICATOR 8 (GLOVE) ×2
GOWN STRL REUS W/ TWL LRG LVL3 (GOWN DISPOSABLE) ×1 IMPLANT
GOWN STRL REUS W/ TWL XL LVL3 (GOWN DISPOSABLE) ×2 IMPLANT
GOWN STRL REUS W/TWL LRG LVL3 (GOWN DISPOSABLE) ×2
GOWN STRL REUS W/TWL XL LVL3 (GOWN DISPOSABLE) ×4
NDL HYPO 25X1 1.5 SAFETY (NEEDLE) IMPLANT
NEEDLE HYPO 25X1 1.5 SAFETY (NEEDLE) IMPLANT
NS IRRIG 1000ML POUR BTL (IV SOLUTION) ×2 IMPLANT
PACK BASIN DAY SURGERY FS (CUSTOM PROCEDURE TRAY) ×2 IMPLANT
SOL ANTI FOG 6CC (MISCELLANEOUS) ×1 IMPLANT
SOLUTION ANTI FOG 6CC (MISCELLANEOUS) ×1
SUT ETHILON 3 0 PS 1 (SUTURE) ×2 IMPLANT
SYR CONTROL 10ML LL (SYRINGE) ×2 IMPLANT
UNDERPAD 30X36 HEAVY ABSORB (UNDERPADS AND DIAPERS) ×2 IMPLANT

## 2019-12-31 NOTE — Interval H&P Note (Signed)
I participated in the care of this patient and agree with the above history, physical and evaluation. I performed a review of the history and a physical exam as detailed   Khushi Zupko Daniel Arvilla Salada MD  

## 2019-12-31 NOTE — Anesthesia Postprocedure Evaluation (Signed)
Anesthesia Post Note  Patient: [name]  Procedure(s) Performed: CARPAL TUNNEL RELEASE ENDOSCOPIC (Right Hand)     Patient location during evaluation: PACU Anesthesia Type: MAC Level of consciousness: awake and alert Pain management: pain level controlled Vital Signs Assessment: post-procedure vital signs reviewed and stable Respiratory status: spontaneous breathing, nonlabored ventilation, respiratory function stable and patient connected to nasal cannula oxygen Cardiovascular status: stable and blood pressure returned to baseline Postop Assessment: no apparent nausea or vomiting Anesthetic complications: no    Last Vitals:  Vitals:   12/31/19 1047 12/31/19 1115  BP:  (!) 150/68  Pulse: 63 69  Resp: 15 15  Temp:  36.8 C  SpO2: 98% 95%    Last Pain:  Vitals:   12/31/19 1115  TempSrc:   PainSc: 0-No pain                 Tiajuana Amass

## 2019-12-31 NOTE — Transfer of Care (Signed)
Immediate Anesthesia Transfer of Care Note  Patient: [name]  Procedure(s) Performed: CARPAL TUNNEL RELEASE ENDOSCOPIC (Right Hand)  Patient Location: PACU  Anesthesia Type:MAC  Level of Consciousness: awake, alert , oriented and patient cooperative  Airway & Oxygen Therapy: Patient Spontanous Breathing and Patient connected to nasal cannula oxygen  Post-op Assessment: Report given to RN and Post -op Vital signs reviewed and stable  Post vital signs: Reviewed and stable  Last Vitals:  Vitals Value Taken Time  BP    Temp    Pulse 65 12/31/19 0944  Resp 21 12/31/19 0944  SpO2 100 % 12/31/19 0944  Vitals shown include unvalidated device data.  Last Pain:  Vitals:   12/31/19 0724  TempSrc: Oral  PainSc: 0-No pain      Patients Stated Pain Goal: 5 (38/33/38 3291)  Complications: No apparent anesthesia complications

## 2019-12-31 NOTE — Discharge Instructions (Signed)
  Post Anesthesia Home Care Instructions  Activity: Get plenty of rest for the remainder of the day. A responsible adult should stay with you for 24 hours following the procedure.  For the next 24 hours, DO NOT: -Drive a car -Paediatric nurse -Drink alcoholic beverages -Take any medication unless instructed by your physician -Make any legal decisions or sign important papers.  Meals: Start with liquid foods such as gelatin or soup. Progress to regular foods as tolerated. Avoid greasy, spicy, heavy foods. If nausea and/or vomiting occur, drink only clear liquids until the nausea and/or vomiting subsides. Call your physician if vomiting continues.  Special Instructions/Symptoms: Your throat may feel dry or sore from the anesthesia or the breathing tube placed in your throat during surgery. If this causes discomfort, gargle with warm salt water. The discomfort should disappear within 24 hours.  If you had a scopolamine patch placed behind your ear for the management of post- operative nausea and/or vomiting:  1. The medication in the patch is effective for 72 hours, after which it should be removed.  Wrap patch in a tissue and discard in the trash. Wash hands thoroughly with soap and water. 2. You may remove the patch earlier than 72 hours if you experience unpleasant side effects which may include dry mouth, dizziness or visual disturbances. 3. Avoid touching the patch. Wash your hands with soap and water after contact with the patch.   Keep wrist elevated with ice as much as possible to reduce pain and swelling.  Diet: As you were doing prior to hospitalization   Shower / Dressing:  Leave dressing in place and dry for 3 days.  You may then remove dressings and shower over incision.  No bath or submerging incision.  Place clean Band-Aid over incision.  Activity:  Increase activity slowly as tolerated, but follow the weight bearing instructions below.  The rules on driving is that you can  not be taking narcotics while you drive, and you must feel in control of the vehicle.    Weight Bearing:  Non weight bearing affected wrist.    To prevent constipation: you may use a stool softener such as -  Colace (over the counter) 100 mg by mouth twice a day  Drink plenty of fluids (prune juice may be helpful) and high fiber foods Miralax (over the counter) for constipation as needed.    Itching:  If you experience itching with your medications, try taking only a single pain pill, or even half a pain pill at a time.  You can also use benadryl over the counter for itching or also to help with sleep.   Precautions:  If you experience chest pain or shortness of breath - call 911 immediately for transfer to the hospital emergency department!!  If you develop a fever greater that 101 F, purulent drainage from wound, increased redness or drainage from wound, or calf pain -- Call the office at [phone]                                         Follow- Up Appointment:  Please call for an appointment to be seen in 2 weeks [place] - [phone]

## 2019-12-31 NOTE — Anesthesia Preprocedure Evaluation (Addendum)
Anesthesia Evaluation  Patient identified by MRN, date of birth, ID band Patient awake    Reviewed: Allergy & Precautions, NPO status , Patient's Chart, lab work & pertinent test results  Airway Mallampati: III  TM Distance: >3 FB Neck ROM: Full    Dental  (+) Dental Advisory Given, Teeth Intact   Pulmonary asthma , former smoker,  + wet  cough this am   breath sounds clear to auscultation       Cardiovascular hypertension, Pt. on medications  Rhythm:Regular Rate:Normal     Neuro/Psych negative neurological ROS     GI/Hepatic Neg liver ROS, GERD  Medicated and Controlled,  Endo/Other  Hypothyroidism Morbid obesity  Renal/GU CRFRenal disease     Musculoskeletal  (+) Arthritis ,   Abdominal   Peds  Hematology negative hematology ROS (+)   Anesthesia Other Findings   Reproductive/Obstetrics                           Anesthesia Physical Anesthesia Plan  ASA: II  Anesthesia Plan: MAC   Post-op Pain Management:    Induction: Intravenous  PONV Risk Score and Plan: 2 and Propofol infusion, Dexamethasone, Ondansetron and Treatment may vary due to age or medical condition  Airway Management Planned: Natural Airway and Simple Face Mask  Additional Equipment:   Intra-op Plan:   Post-operative Plan:   Informed Consent: I have reviewed the patients History and Physical, chart, labs and discussed the procedure including the risks, benefits and alternatives for the proposed anesthesia with the patient or authorized representative who has indicated his/her understanding and acceptance.       Plan Discussed with: CRNA  Anesthesia Plan Comments:         Anesthesia Quick Evaluation

## 2019-12-31 NOTE — Op Note (Signed)
12/31/2019  9:46 AM  PATIENT:  [name]    PRE-OPERATIVE DIAGNOSIS:  RT CARPAL TUNNEL SYNDROME  POST-OPERATIVE DIAGNOSIS:  Same  PROCEDURE:  CARPAL TUNNEL RELEASE ENDOSCOPIC  SURGEON:  Renette Butters, MD  PHYSICIAN ASSISTANT:  Roxan Hockey, PA-C, he was present and scrubbed throughout the case, critical for completion in a timely fashion, and for retraction, instrumentation, and closure.   ANESTHESIA:   General  PREOPERATIVE INDICATIONS:  [name] is a  85 y.o. female with a diagnosis of RT CARPAL TUNNEL SYNDROME who failed conservative measures and elected for surgical management.    The risks benefits and alternatives were discussed with the patient preoperatively including but not limited to the risks of infection, bleeding, nerve injury, incomplete relief of symptoms, pillar pain, cardiopulmonary complications, the need for revision surgery, among others, and the patient was willing to proceed.  OPERATIVE PROCEDURE: Patient was identified in the preoperative holding area and site was marked by me. female was transported to the operating theater and placed on the table in the supine position taking care to pad all bony prominences. After appropriate time out general anesthesia was induced and female received ancef for preoperative antibiotics. The extremity was prepped and draped in normal sterile fashion.  I made a 1 severe incision just proximal to the dominant volar wrist crease in line with the radial aspect of the small finger. Spread down to the fascia and this was incised in line with the incision. Metzenbaum scissors were then prepped passed above and below the fascia proximally to clear the nerve away from the fascia clear soft tissue plane superficial to it as well. This was then incised.  I then turned attention distally where the fascia edge was as was visible within the wound. I sequentially dilated the carpal tunnel sweeping soft tissue off of the undersurface of  the ligament which was palpable throughout each past. I then inserted the endoscope and under direct visualization was able to see the undersurface the carpal tunnel throughout the entire insertion. Wasn't visualized the distal end of the ligament I put the blade retracted the instrument incising the transverse carpal ligament. I then reinserted the endoscope and directly visualized each cut and as well as intact median nerve without harm.   Wound was then irrigated and closed with a superficial suture. A sterile dressing and splint was applied   She tolerated this well, with no complications.  POSTOPERATIVE PLAN: Keep the splint clean and dry. VTE prophylaxis will consist of ambulation and foot pump exercises and possible chemical px as indicated

## 2020-01-02 ENCOUNTER — Telehealth: Payer: Self-pay | Admitting: *Deleted

## 2020-01-02 NOTE — Telephone Encounter (Signed)
Copied from [place] [id number]. Topic: General - Other >> Jan 02, 2020 11:00 AM Rainey Pines A wrote: Patient would like a callback from nurse with clarification on how she should be taking her amLODipine (NORVASC) 5 MG tablet

## 2020-01-07 ENCOUNTER — Ambulatory Visit: Payer: PPO | Attending: Internal Medicine

## 2020-01-07 DIAGNOSIS — Z23 Encounter for immunization: Secondary | ICD-10-CM | POA: Insufficient documentation

## 2020-01-07 NOTE — Progress Notes (Signed)
   Covid-19 Vaccination Clinic  Name:  [name]    MRN: [id number] DOB: 1935/02/14  01/07/2020  [name] was observed post Covid-19 immunization for 15 minutes without incidence. She was provided with Vaccine Information Sheet and instruction to access the V-Safe system.   [name] was instructed to call 911 with any severe reactions post vaccine: [name] Kitchen Difficulty breathing  . Swelling of your face and throat  . A fast heartbeat  . A bad rash all over your body  . Dizziness and weakness    Immunizations Administered    Name Date Dose VIS Date Route   Pfizer COVID-19 Vaccine 01/07/2020 10:40 AM 0.3 mL 11/29/2019 Intramuscular   Manufacturer: [employer]   Lot: [id number]   [place]: [id number]

## 2020-01-13 DIAGNOSIS — G5601 Carpal tunnel syndrome, right upper limb: Secondary | ICD-10-CM | POA: Diagnosis not present

## 2020-01-27 ENCOUNTER — Ambulatory Visit: Payer: PPO | Attending: Internal Medicine

## 2020-01-27 DIAGNOSIS — Z23 Encounter for immunization: Secondary | ICD-10-CM | POA: Insufficient documentation

## 2020-01-27 NOTE — Progress Notes (Signed)
   Covid-19 Vaccination Clinic  Name:  [name]    MRN: [id number] DOB: 1934-12-25  01/27/2020  [name] was observed post Covid-19 immunization for 15 minutes without incidence. She was provided with Vaccine Information Sheet and instruction to access the V-Safe system.   [name] was instructed to call 911 with any severe reactions post vaccine: [name] Kitchen Difficulty breathing  . Swelling of your face and throat  . A fast heartbeat  . A bad rash all over your body  . Dizziness and weakness    Immunizations Administered    Name Date Dose VIS Date Route   Pfizer COVID-19 Vaccine 01/27/2020 10:41 AM 0.3 mL 11/29/2019 Intramuscular   Manufacturer: [place]   Lot: [id number]   [place]: [id number]

## 2020-01-28 ENCOUNTER — Other Ambulatory Visit: Payer: Self-pay

## 2020-01-29 ENCOUNTER — Ambulatory Visit (INDEPENDENT_AMBULATORY_CARE_PROVIDER_SITE_OTHER): Payer: PPO | Admitting: Family Medicine

## 2020-01-29 ENCOUNTER — Encounter: Payer: Self-pay | Admitting: Family Medicine

## 2020-01-29 VITALS — BP 128/80 | HR 71 | Temp 97.4°F | Ht 65.0 in | Wt 229.5 lb

## 2020-01-29 DIAGNOSIS — E559 Vitamin D deficiency, unspecified: Secondary | ICD-10-CM | POA: Diagnosis not present

## 2020-01-29 DIAGNOSIS — K219 Gastro-esophageal reflux disease without esophagitis: Secondary | ICD-10-CM | POA: Diagnosis not present

## 2020-01-29 DIAGNOSIS — I10 Essential (primary) hypertension: Secondary | ICD-10-CM

## 2020-01-29 DIAGNOSIS — M159 Polyosteoarthritis, unspecified: Secondary | ICD-10-CM

## 2020-01-29 DIAGNOSIS — J4520 Mild intermittent asthma, uncomplicated: Secondary | ICD-10-CM

## 2020-01-29 DIAGNOSIS — M8949 Other hypertrophic osteoarthropathy, multiple sites: Secondary | ICD-10-CM

## 2020-01-29 DIAGNOSIS — E039 Hypothyroidism, unspecified: Secondary | ICD-10-CM | POA: Diagnosis not present

## 2020-01-29 DIAGNOSIS — E7800 Pure hypercholesterolemia, unspecified: Secondary | ICD-10-CM

## 2020-01-29 DIAGNOSIS — N259 Disorder resulting from impaired renal tubular function, unspecified: Secondary | ICD-10-CM | POA: Diagnosis not present

## 2020-01-29 LAB — CBC WITH DIFFERENTIAL/PLATELET
Basophils Absolute: 0 10*3/uL (ref 0.0–0.1)
Basophils Relative: 0.5 % (ref 0.0–3.0)
Eosinophils Absolute: 0.2 10*3/uL (ref 0.0–0.7)
Eosinophils Relative: 6.6 % — ABNORMAL HIGH (ref 0.0–5.0)
HCT: 40.6 % (ref 36.0–46.0)
Hemoglobin: 13.1 g/dL (ref 12.0–15.0)
Lymphocytes Relative: 31.7 % (ref 12.0–46.0)
Lymphs Abs: 1.1 10*3/uL (ref 0.7–4.0)
MCHC: 32.2 g/dL (ref 30.0–36.0)
MCV: 88.6 fl (ref 78.0–100.0)
Monocytes Absolute: 0.4 10*3/uL (ref 0.1–1.0)
Monocytes Relative: 10.6 % (ref 3.0–12.0)
Neutro Abs: 1.8 10*3/uL (ref 1.4–7.7)
Neutrophils Relative %: 50.6 % (ref 43.0–77.0)
Platelets: 181 10*3/uL (ref 150.0–400.0)
RBC: 4.58 Mil/uL (ref 3.87–5.11)
RDW: 16.2 % — ABNORMAL HIGH (ref 11.5–15.5)
WBC: 3.5 10*3/uL — ABNORMAL LOW (ref 4.0–10.5)

## 2020-01-29 LAB — COMPREHENSIVE METABOLIC PANEL
ALT: 10 U/L (ref 0–35)
AST: 15 U/L (ref 0–37)
Albumin: 3.6 g/dL (ref 3.5–5.2)
Alkaline Phosphatase: 130 U/L — ABNORMAL HIGH (ref 39–117)
BUN: 24 mg/dL — ABNORMAL HIGH (ref 6–23)
CO2: 29 mEq/L (ref 19–32)
Calcium: 10.3 mg/dL (ref 8.4–10.5)
Chloride: 106 mEq/L (ref 96–112)
Creatinine, Ser: 1.68 mg/dL — ABNORMAL HIGH (ref 0.40–1.20)
GFR: 29.15 mL/min — ABNORMAL LOW (ref >60.00)
Glucose, Bld: 97 mg/dL (ref 70–99)
Potassium: 4.2 mEq/L (ref 3.5–5.1)
Sodium: 140 mEq/L (ref 135–145)
Total Bilirubin: 0.5 mg/dL (ref 0.2–1.2)
Total Protein: 5.8 g/dL — ABNORMAL LOW (ref 6.0–8.3)

## 2020-01-29 LAB — VITAMIN D 25 HYDROXY (VIT D DEFICIENCY, FRACTURES): VITD: 15.35 ng/mL — ABNORMAL LOW (ref 30.00–100.00)

## 2020-01-29 LAB — TSH: TSH: 0.37 u[IU]/mL (ref 0.35–4.50)

## 2020-01-29 LAB — LIPID PANEL
Cholesterol: 155 mg/dL (ref 0–200)
HDL: 49.5 mg/dL (ref >39.00)
LDL Cholesterol: 79 mg/dL (ref 0–99)
NonHDL: 105.26
Total CHOL/HDL Ratio: 3
Triglycerides: 133 mg/dL (ref 0.0–149.0)
VLDL: 26.6 mg/dL (ref 0.0–40.0)

## 2020-01-29 NOTE — Progress Notes (Signed)
[name] DOB: March 23, 1935 Encounter date: 01/29/2020  This is a 85 y.o. female who presents with Chief Complaint  Patient presents with  . Follow-up    History of present illness:  HTN: amlodipine 7.5mg . bp has been more normal.   Mild intermittent asthma: breathing has been good. Allergies have been controlled.   GERD: Stable on Nexium. Controlled with this; notes on days she doesn't take it.   Hypothyroid: She is taking her Synthroid in the morning on an empty stomach (137 mcg daily).    ARF :Nephrologist retired. Saw her last month.   myrbetriq has helped quite a lot. Able to get to toilet now. just taking 1 tablet. Working well.   Mood doing fine.   Has completed COVID vaccination.   No Known Allergies Current Meds  Medication Sig  . amLODipine (NORVASC) 5 MG tablet TAKE 1 TABLET BY MOUTH EVERY DAY (Patient taking differently: Take 7.5 mg by mouth daily. )  . DULoxetine (CYMBALTA) 30 MG capsule TAKE 1 CAPSULE BY MOUTH EVERY DAY  . esomeprazole (NEXIUM) 40 MG capsule Take 1 capsule (40 mg total) by mouth daily before breakfast.  . furosemide (LASIX) 40 MG tablet TK 1 T PO D PRF LEG SWELLING  . HYDROcodone-acetaminophen (NORCO) 5-325 MG tablet Take 1-2 tablets by mouth every 6 (six) hours as needed for severe pain.  [name] Kitchen levothyroxine (SYNTHROID) 137 MCG tablet TAKE 1 TABLET(137 MCG) BY MOUTH DAILY BEFORE BREAKFAST  . montelukast (SINGULAIR) 10 MG tablet TAKE 1 TABLET BY MOUTH EVERY DAY  . MYRBETRIQ 25 MG TB24 tablet START WITH 1 TABLET BY MOUTH ONCE DAILY, INCREASE TO 2 TABLETS DAILY IN 1 WEEK IF INCONTINENCE PERSISTS  . ondansetron (ZOFRAN) 4 MG tablet Take 1 tablet (4 mg total) by mouth every 8 (eight) hours as needed for nausea or vomiting.    Review of Systems  Constitutional: Negative for chills, fatigue and fever.  Respiratory: Negative for cough, chest tightness, shortness of breath and wheezing.   Cardiovascular: Negative for chest pain, palpitations and leg  swelling.    Objective:  BP 128/80 (BP Location: Left Arm, Patient Position: Sitting, Cuff Size: Large)   Pulse 71   Temp (!) 97.4 F (36.3 C) (Temporal)   Ht 5\' 5"  (1.651 m)   Wt 229 lb 8 oz (104.1 kg)   SpO2 97%   BMI 38.19 kg/m   Weight: 229 lb 8 oz (104.1 kg)   BP Readings from Last 3 Encounters:  01/29/20 128/80  12/31/19 (!) 150/68  07/24/19 (!) 160/90   Wt Readings from Last 3 Encounters:  01/29/20 229 lb 8 oz (104.1 kg)  12/31/19 238 lb 8 oz (108.2 kg)  07/24/19 243 lb 14.4 oz (110.6 kg)    Physical Exam Constitutional:      General: She is not in acute distress.    Appearance: She is well-developed.  Cardiovascular:     Rate and Rhythm: Normal rate and regular rhythm.     Heart sounds: Normal heart sounds. No murmur. No friction rub.  Pulmonary:     Effort: Pulmonary effort is normal. No respiratory distress.     Breath sounds: Normal breath sounds. No wheezing or rales.  Musculoskeletal:     Right lower leg: No edema.     Left lower leg: No edema.  Neurological:     Mental Status: She is alert and oriented to person, place, and time.  Psychiatric:        Behavior: Behavior normal.  Assessment/Plan 1. Essential hypertension Improved control.  Continue current medication. - CBC with Differential/Platelet - Comprehensive metabolic panel  2. Mild intermittent asthma without complication Stable with allergy control.  3. Gastroesophageal reflux disease, unspecified whether esophagitis present Stable with Nexium.  4. Acquired hypothyroidism Stable on current dose of Synthroid 137 mcg daily.  Will recheck thyroid studies today. - TSH  5. Primary osteoarthritis involving multiple joints Per patient, this is not limiting her activity.  Hydrocodone if needed.  6. Disorder resulting from impaired renal function will be following up with new nephrologist.  Kidney function has been stable.  7. HYPERCHOLESTEROLEMIA, PURE Has been diet controlled. -  Lipid panel  8. Vitamin D deficiency - VITAMIN D 25 Hydroxy (Vit-D Deficiency, Fractures)    Return in about 6 months (around 07/28/2020) for physical exam.     Micheline Rough, MD

## 2020-02-02 ENCOUNTER — Other Ambulatory Visit: Payer: Self-pay | Admitting: Family Medicine

## 2020-02-02 DIAGNOSIS — D72819 Decreased white blood cell count, unspecified: Secondary | ICD-10-CM

## 2020-02-02 DIAGNOSIS — N184 Chronic kidney disease, stage 4 (severe): Secondary | ICD-10-CM

## 2020-02-02 MED ORDER — VITAMIN D (ERGOCALCIFEROL) 1.25 MG (50000 UNIT) PO CAPS: 50000.0000 [IU] | ORAL_CAPSULE | ORAL | 1 refills | Status: DC

## 2020-02-04 ENCOUNTER — Telehealth: Payer: Self-pay | Admitting: Family Medicine

## 2020-02-04 NOTE — Progress Notes (Signed)
  Chronic Care Management  ° °Note ° °02/04/2020 °Name: Vivi T Coopersmith MRN: [id number] DOB: 01/28/1935 ° °Rocquel T Dueitt is a 84 y.o. year old female who is a primary care patient of Koberlein, Junell C, MD. I reached out to Emmalou T Billing by phone today in response to a referral sent by Ms. Jahyra T Sholtz's PCP, Koberlein, Junell C, MD.  ° °Ms. Buelow was given information about Chronic Care Management services today including:  °1. CCM service includes personalized support from designated clinical staff supervised by her physician, including individualized plan of care and coordination with other care providers °2. 24/7 contact phone numbers for assistance for urgent and routine care needs. °3. Service will only be billed when office clinical staff spend 20 minutes or more in a month to coordinate care. °4. Only one practitioner may furnish and bill the service in a calendar month. °5. The patient may stop CCM services at any time (effective at the end of the month) by phone call to the office staff. °6. The patient will be responsible for cost sharing (co-pay) of up to 20% of the service fee (after annual deductible is met). ° °Patient agreed to services and verbal consent obtained.  ° °Follow up plan: ° ° °Raynicia Dukes °[employer] Sched[facility] °

## 2020-02-04 NOTE — Chronic Care Management (AMB) (Signed)
Chronic Care Management   Note  02/04/2020 Name: YATZIRY DEAKINS MRN: [id number] DOB: [birth year]  [name] is a 85 y.o. year old female who is a primary care patient of Koberlein, Steele Berg, MD. I reached out to [name] by phone today in response to a referral sent by Ms. Allena Napoleon Galiano's PCP, Caren Macadam, MD.   Ms. Grindstaff was given information about Chronic Care Management services today including:  1. CCM service includes personalized support from designated clinical staff supervised by her physician, including individualized plan of care and coordination with other care providers 2. 24/7 contact phone numbers for assistance for urgent and routine care needs. 3. Service will only be billed when office clinical staff spend 20 minutes or more in a month to coordinate care. 4. Only one practitioner may furnish and bill the service in a calendar month. 5. The patient may stop CCM services at any time (effective at the end of the month) by phone call to the office staff. 6. The patient will be responsible for cost sharing (co-pay) of up to 20% of the service fee (after annual deductible is met).  Patient agreed to services and verbal consent obtained.   Follow up plan:   Raynicia Dukes [employer] Scheduler

## 2020-02-10 ENCOUNTER — Telehealth: Payer: Self-pay

## 2020-02-10 ENCOUNTER — Other Ambulatory Visit: Payer: Self-pay | Admitting: Family Medicine

## 2020-02-10 DIAGNOSIS — M159 Polyosteoarthritis, unspecified: Secondary | ICD-10-CM

## 2020-02-10 DIAGNOSIS — I10 Essential (primary) hypertension: Secondary | ICD-10-CM

## 2020-02-10 DIAGNOSIS — E039 Hypothyroidism, unspecified: Secondary | ICD-10-CM

## 2020-02-10 NOTE — Telephone Encounter (Signed)
done

## 2020-02-10 NOTE — Telephone Encounter (Signed)
I am requesting an ambulatory referral to CCM for [name]. Referral will need 2 current diagnosis attached to it. Thank you.

## 2020-02-11 ENCOUNTER — Other Ambulatory Visit: Payer: Self-pay

## 2020-02-11 ENCOUNTER — Ambulatory Visit: Payer: PPO

## 2020-02-11 DIAGNOSIS — I10 Essential (primary) hypertension: Secondary | ICD-10-CM

## 2020-02-11 DIAGNOSIS — K219 Gastro-esophageal reflux disease without esophagitis: Secondary | ICD-10-CM

## 2020-02-11 NOTE — Chronic Care Management (AMB) (Signed)
Chronic Care Management Pharmacy  Name: KAJOL CRISPEN  MRN: [id number] DOB: 08/20/1935  Initial Questions: 1. Have you seen any other providers since your last visit? NA 2. Any changes in your medicines or health? No   Chief Complaint/ HPI  [name],  85 y.o. , female presents for their Initial CCM visit with the clinical pharmacist via telephone due to COVID-19 Pandemic.  PCP : Caren Macadam, MD  Their chronic conditions include: HTN, asthma, GERD, Hypothyroidism, Osteoarthritis, CKD, stage 4, Hypercholesterolemia, Urinary incontinence.   Office Visits: 01/29/2020- Patient presented to Dr. Micheline Rough, MD for HTN follow up. HTN improved and chronic conditions are stable (Asthma, GERD, Hypothyroidism, osteoarthritis, impaired renal function, Hyper cholesterolemia, Vitamin D deficiency.   07/24/2019- Patient presented to Dr. Micheline Rough, MD in office for follow up. Amlodipine increased to 7.5mg  daily (1.5 tablets). Patient to start checking BP at home daily. Asthma, GERD stable. Labs ordered: CBC, CMP, TSH.   02/06/2019-  Patient presented to Dr. Micheline Rough, MD in office for urinary incontinence. Patient was on Detrol LA; patient to try Myrbetriq. Patient instructed to check BP reading and report back.   Consult Visit:  12/31/2019- Discharged from ambulatory surgery (Right carpal tunnel syndrome).   12/25/2019- Orthopedics- Patient presented to Dr. Edmonia Lynch, MD. Patient presented with cortisone injections versus endoscopic carpal tunnel release. Patient preferred to go forward with surgery.   12/17/2019- Nephrology- Patient presented to Dr. Jamal Maes.   09/18/2019- General surgery- Patient presented to Dr. Armandina Gemma for hyperparathyroidism/ hypercalcemia.   Medications: Outpatient Encounter Medications as of 02/11/2020  Medication Sig Note  . amLODipine (NORVASC) 5 MG tablet TAKE 1 TABLET BY MOUTH EVERY DAY (Patient taking differently: Take 7.5 mg  by mouth daily. )   . DULoxetine (CYMBALTA) 30 MG capsule TAKE 1 CAPSULE BY MOUTH EVERY DAY   . esomeprazole (NEXIUM) 40 MG capsule Take 1 capsule (40 mg total) by mouth daily before breakfast. (Patient taking differently: Take 40 mg by mouth daily before breakfast. Takes 20mg  once daily)   . furosemide (LASIX) 40 MG tablet Patient reports taking three times a week 01/26/2016: Received from: External Pharmacy  . levothyroxine (SYNTHROID) 137 MCG tablet TAKE 1 TABLET(137 MCG) BY MOUTH DAILY BEFORE BREAKFAST   . montelukast (SINGULAIR) 10 MG tablet TAKE 1 TABLET BY MOUTH EVERY DAY   . MYRBETRIQ 25 MG TB24 tablet START WITH 1 TABLET BY MOUTH ONCE DAILY, INCREASE TO 2 TABLETS DAILY IN 1 WEEK IF INCONTINENCE PERSISTS   . simvastatin (ZOCOR) 20 MG tablet Take 20 mg by mouth daily.   . Vitamin D, Ergocalciferol, (DRISDOL) 1.25 MG (50000 UNIT) CAPS capsule Take 1 capsule (50,000 Units total) by mouth every 7 (seven) days.   [name] Kitchen HYDROcodone-acetaminophen (NORCO) 5-325 MG tablet Take 1-2 tablets by mouth every 6 (six) hours as needed for severe pain. (Patient not taking: Reported on 02/11/2020)   . ondansetron (ZOFRAN) 4 MG tablet Take 1 tablet (4 mg total) by mouth every 8 (eight) hours as needed for nausea or vomiting. (Patient not taking: Reported on 02/11/2020)    No facility-administered encounter medications on file as of 02/11/2020.     Current Diagnosis/Assessment:  Goals Addressed            This Visit's Progress   . Pharmacy Care Plan       Current Barriers:  . Chronic Disease Management support, education, and care coordination needs related to HTN and HLD, Asthma, GERD, Hypothyroidism, Osteoarthritis, CKD, stage  4, Urinary incontinence   Pharmacist Clinical Goal(s):  [name] Kitchen Maintain Blood pressure <130/80 mmHg  . Prevent worsening of shortness or breath and hospitalizations . Cholesterol goals: Total Cholesterol goal under 200, Triglycerides goal under 150, HDL goal above 40 (men) or above 50  (women), LDL goal under 100.  . Maintain TSH between 0.45 to 4.5uIU/ml . Minimize reflux symptoms  . Maintain Vitamin D-25 level at or above 30.  . Reduce urinary symptoms    Interventions: . Comprehensive medication review performed. . Collaboration with provider re: medication management . Discussed diet and exercise modifications and effect on blood pressure and cholesterol. . Discussed DASH diet:  following a diet emphasizing fruits and vegetables and low-fat dairy products along with whole grains, fish, poultry, and nuts. Reducing red meats and sugars.  . Discussed modifying their lifestyle, including initial attempts to lose 5 to 10% of body weight.   Engaging in at least 150 minutes per week of moderate-intensity exercise such as brisk walking (15- to 20-minute mile) or something similar. Recommended they incorporate flexibility, balance, and some type of strength training exercises.    . Discussed non-pharmacological interventions for acid reflux. Take measures to prevent acid reflux, such as avoiding spicy foods, avoiding caffeine, avoid laying down a few hours after eating, and raising the head of the bed.  Patient Self Care Activities:  . Calls provider office for new concerns or questions . Continue taking medications as directed by your providers.  . Continue taking vitamin D3 50,000 units once weekly until gone, then transition to vitamin D3 1000 units once daily.  . Decrease amlodipine dose to 10mg , 1 tablet daily  . Continue at home blood pressure readings.  Initial goal documentation        Asthma    Eosinophil count:   Lab Results  Component Value Date/Time   EOSPCT 6.6 (H) 01/29/2020 12:28 PM  %                               Eos (Absolute):  Lab Results  Component Value Date/Time   EOSABS 0.2 01/29/2020 12:28 PM    Tobacco Status:  Social History   Tobacco Use  Smoking Status Former Smoker  . Packs/day: 0.25  . Years: 6.00  . Pack years: 1.50  .  Types: Cigarettes  . Quit date: 12/19/1978  . Years since quitting: 41.1  Smokeless Tobacco Never Used   Patient has failed these meds in past: Qvar, Dulera  Per patient report, patient is currently controlled on the following medications:  - montelukast 10mg , 1 tablet daily - albuterol HFA inhaler (has not needed to use over a year).    Using maintenance inhaler regularly? No Frequency of rescue inhaler use:  never  We discussed:  proper inhaler technique  Plan Continue current medications ,  Hypertension   Office blood pressures are  BP Readings from Last 3 Encounters:  01/29/20 128/80  12/31/19 (!) 150/68  07/24/19 (!) 160/90   Patient has failed these meds in the past: benazepril, Benicar, HCTZ, losartan, olmesartan,   Patient checks BP at home infrequently  Patient home BP readings are ranging: does not remember BP readings.   Patient is controlled on:  - amlodipine 5mg , 1.5 tablets once daily (Patient reports taking amlodipine 10mg , 1.5 tablets (15mg ) once daily  We discussed diet and exercise extensively  - DASH diet:  following a diet emphasizing fruits and vegetables and low-fat dairy products  along with whole grains, fish, poultry, and nuts. Reducing red meats and sugars.  - Reducing the amount of salt intake to 1500mg /per day.  - Recommend using a salt substitute to replace your salt if you need flavor.    - Weight reduction- We discussed losing 5-10% of body weight.    Plan Patient states amlodipine managed by nephrologist, Jamal Maes.  Consulted Dr. Sanda Klein office for correct dose of amlodipine. Patient is supposed to be taking 10mg , 1 tablet daily.  Discussed with patient correct dose of amlodipine, and will initiate monitoring plan for BP readings in 2 weeks.   Hyperlipidemia  Lipid Panel     Component Value Date/Time   CHOL 155 01/29/2020 1228   TRIG 133.0 01/29/2020 1228   TRIG 138 10/27/2006 0950   HDL 49.50 01/29/2020 1228   CHOLHDL 3  01/29/2020 1228   VLDL 26.6 01/29/2020 1228   LDLCALC 79 01/29/2020 1228   LDLDIRECT 174.2 07/09/2007 1134     ASCVD 10-year risk: 18.5%   Patient has failed these meds in past: none  Patient is currently controlled on the following medications:  - simvastatin 20mg , 1 tablet at bedtime  We discussed:  diet and exercise extensively  - diet: patient endorses diet is not as good as it should be.   - exercise: We discussed modifying their lifestyle, including initial attempts to lose 5 to 10% of body weight.   Engaging in at least 150 minutes per week of moderate-intensity exercise such as brisk walking (15- to 20-minute mile) or something similar. Recommended they incorporate flexibility, balance, and some type of strength training exercises.     Plan Continue current medications.    Hypothyroidism   TSH  Date Value Ref Range Status  01/29/2020 0.37 0.35 - 4.50 uIU/mL Final    Patient has failed these meds in past: none  Patient is currently controlled on the following medications: - levothyroxine 137 mcg, 1 tablet before breakfast  Plan Continue current medications.  GERD  Patient has failed these meds in past:none  Patient is currently controlled on the following medications:  - esomeprazole, 1 capsule daily with breakfast   We discussed:  non-pharmacological interventions for acid reflux. Take measures to prevent acid reflux, such as avoiding spicy foods, avoiding caffeine, avoid laying down a few hours after eating, and raising the head of the bed. - discussed risks of long-term use of PPIs.   Plan Patient interested in the future to taper off esomeprazole to see how she does. She did try in the past to stop, but she discontinued abruptly and experienced acid reflux. Reassess at next follow up.  Continue current medications  Osteoarthritis  Patient has failed these meds in past: Tramadol (nauseous), Tylenol  Patient is currently controlled on the following  medications: no medications.   Plan Patient states pain is minimal.  Continue as is.   CKD, stage 4  01/29/2020 Creatinine: 1.68 GFR: 29  07/24/2019 Creatinine: 1.44 GFR: 35  64oz. Of water per day.   Plan Decreased kidney function.  Advised patient to maintain adequate fluid intake.  Continue to monitor (patient sees nephrologist- Dr. Jamal Maes). Called office to discuss amlodipine dose. Dr. Lorrene Reid is retiring in May and patient will then be seeing Dr. Madelon Lips.  Urinary incontinence  Patient has failed these meds in past: Vesicare (solifenacin), Detrol LA (tolterodine)  Patient is currently controlled on the following medications: - Myrbetriq 25mg , 1 tablet daily  Plan Continue current medications  Leg swelling  Patient has failed these meds in past: Patient is currently controlled on the following medications: - fursodemide 40mg , 1 tablet daily as needed (3x a week).   Plan Swelling controlled per patient report.  Decreasing amlodipine dose to 10mg  may improve swelling. Patient was taking 15mg  daily.  Continue current medications.   Vitamin D deficiency    Patient is currently controlled on the following medications:  - Vitamin D3 50,000 IU, 1 cap every Tuesday  Vitamin D3: 15.35 (01/29/2020)   Plan Patient recommended to take 1000 units daily once vitamin D3 50,000 course is completed.  Continue current medications  Nerve pain  Patient has failed these meds in past: none  Patient is currently controlled on the following medications:  - duloxetine 30mg , 1 capsule daily   Plan Continue current medications  Allergies   Patient has failed these meds in past: none  Patient is currently controlled on the following medications:  - Xyzal 5mg , 1 tablet daily at bedtime.   Plan Continue current medications.   Medication Management   Patient organizes medications: patient states she uses pill box, but endorses not being fully compliant with  medications.  Denies issues obtaining meds from pharmacy/ cost.   Verbal consent obtained for [facility] enhanced pharmacy services (medication synchronization, adherence packaging, delivery coordination). A medication sync plan was created to allow patient to get all medications delivered once every 30 to 90 days per patient preference. Patient understands they have freedom to choose pharmacy and clinical pharmacist will coordinate care between all prescribers and [facility].  Follow up - Follow up appointment in 6 months.  - Contacted Dr. Sanda Klein office and was advised patient is to be on amlodipine 10mg , 1 tablet daily (patient was taking amlodipine 10mg  1.5 tablets daily). Relayed information to patient. Will provide a follow up phone call in 2 weeks to recheck BP readings.  - Requested prescriptions be sent to [facility] from Dr. Ethlyn Gallery and specialists.   Anson Crofts, PharmD Clinical Pharmacist [facility] [phone]

## 2020-02-13 NOTE — Patient Instructions (Addendum)
Visit Information  Goals Addressed            This Visit's Progress   . Pharmacy Care Plan       Current Barriers:  . Chronic Disease Management support, education, and care coordination needs related to HTN and HLD, Asthma, GERD, Hypothyroidism, Osteoarthritis, CKD, stage 4, Urinary incontinence   Pharmacist Clinical Goal(s):  [name] Kitchen Maintain Blood pressure <130/80 mmHg  . Prevent worsening of shortness or breath and hospitalizations . Cholesterol goals: Total Cholesterol goal under 200, Triglycerides goal under 150, HDL goal above 40 (men) or above 50 (women), LDL goal under 100.  . Maintain TSH between 0.45 to 4.5uIU/ml . Minimize reflux symptoms  . Maintain Vitamin D-25 level at or above 30.  . Reduce urinary symptoms    Interventions: . Comprehensive medication review performed. . Collaboration with provider re: medication management . Discussed diet and exercise modifications and effect on blood pressure and cholesterol. . Discussed DASH diet:  following a diet emphasizing fruits and vegetables and low-fat dairy products along with whole grains, fish, poultry, and nuts. Reducing red meats and sugars.  . Discussed modifying their lifestyle, including initial attempts to lose 5 to 10% of body weight.   Engaging in at least 150 minutes per week of moderate-intensity exercise such as brisk walking (15- to 20-minute mile) or something similar. Recommended they incorporate flexibility, balance, and some type of strength training exercises.    . Discussed non-pharmacological interventions for acid reflux. Take measures to prevent acid reflux, such as avoiding spicy foods, avoiding caffeine, avoid laying down a few hours after eating, and raising the head of the bed.  Patient Self Care Activities:  . Calls provider office for new concerns or questions . Continue taking medications as directed by your providers.  . Continue taking vitamin D3 50,000 units once weekly until gone, then  transition to vitamin D3 1000 units once daily.  . Decrease amlodipine dose to '10mg'$ , 1 tablet daily  . Continue at home blood pressure readings.  Initial goal documentation        [name] was given information about Chronic Care Management services today including:  1. CCM service includes personalized support from designated clinical staff supervised by her physician, including individualized plan of care and coordination with other care providers 2. 24/7 contact phone numbers for assistance for urgent and routine care needs. 3. Service will only be billed when office clinical staff spend 20 minutes or more in a month to coordinate care. 4. Only one practitioner may furnish and bill the service in a calendar month. 5. The patient may stop CCM services at any time (effective at the end of the month) by phone call to the office staff. 6. The patient will be responsible for cost sharing (co-pay) of up to 20% of the service fee (after annual deductible is met).  Patient agreed to services and verbal consent obtained.   The patient verbalized understanding of instructions provided today and agreed to receive a mailed copy of patient instruction and/or educational materials. Telephone follow up appointment with pharmacy team member scheduled for: 08/11/2020  Anson Crofts, PharmD Clinical Pharmacist [facility] [place] [phone]  Verbal consent obtained for [facility] enhanced pharmacy services (medication synchronization, adherence packaging, delivery coordination). A medication sync plan was created to allow patient to get all medications delivered once every 30 to 90 days per patient preference. Patient understands they have freedom to choose pharmacy and clinical pharmacist will coordinate care between  all prescribers and [facility].   DASH Eating Plan DASH stands for "Dietary Approaches to Stop Hypertension." The DASH eating plan is a healthy  eating plan that has been shown to reduce high blood pressure (hypertension). It may also reduce your risk for type 2 diabetes, heart disease, and stroke. The DASH eating plan may also help with weight loss. What are tips for following this plan?  General guidelines  Avoid eating more than 2,300 mg (milligrams) of salt (sodium) a day. If you have hypertension, you may need to reduce your sodium intake to 1,500 mg a day.  Limit alcohol intake to no more than 1 drink a day for nonpregnant women and 2 drinks a day for men. One drink equals 12 oz of beer, 5 oz of wine, or 1 oz of hard liquor.  Work with your health care provider to maintain a healthy body weight or to lose weight. Ask what an ideal weight is for you.  Get at least 30 minutes of exercise that causes your heart to beat faster (aerobic exercise) most days of the week. Activities may include walking, swimming, or biking.  Work with your health care provider or diet and nutrition specialist (dietitian) to adjust your eating plan to your individual calorie needs. Reading food labels   Check food labels for the amount of sodium per serving. Choose foods with less than 5 percent of the Daily Value of sodium. Generally, foods with less than 300 mg of sodium per serving fit into this eating plan.  To find whole grains, look for the word "whole" as the first word in the ingredient list. Shopping  Buy products labeled as "low-sodium" or "no salt added."  Buy fresh foods. Avoid canned foods and premade or frozen meals. Cooking  Avoid adding salt when cooking. Use salt-free seasonings or herbs instead of table salt or sea salt. Check with your health care provider or pharmacist before using salt substitutes.  Do not fry foods. Cook foods using healthy methods such as baking, boiling, grilling, and broiling instead.  Cook with heart-healthy oils, such as olive, canola, soybean, or sunflower oil. Meal planning  Eat a balanced diet  that includes: ? 5 or more servings of fruits and vegetables each day. At each meal, try to fill half of your plate with fruits and vegetables. ? Up to 6-8 servings of whole grains each day. ? Less than 6 oz of lean meat, poultry, or fish each day. A 3-oz serving of meat is about the same size as a deck of cards. One egg equals 1 oz. ? 2 servings of low-fat dairy each day. ? A serving of nuts, seeds, or beans 5 times each week. ? Heart-healthy fats. Healthy fats called Omega-3 fatty acids are found in foods such as flaxseeds and coldwater fish, like sardines, salmon, and mackerel.  Limit how much you eat of the following: ? Canned or prepackaged foods. ? Food that is high in trans fat, such as fried foods. ? Food that is high in saturated fat, such as fatty meat. ? Sweets, desserts, sugary drinks, and other foods with added sugar. ? Full-fat dairy products.  Do not salt foods before eating.  Try to eat at least 2 vegetarian meals each week.  Eat more home-cooked food and less restaurant, buffet, and fast food.  When eating at a restaurant, ask that your food be prepared with less salt or no salt, if possible. What foods are recommended? The items listed may not be  a complete list. Talk with your dietitian about what dietary choices are best for you. Grains Whole-grain or whole-wheat bread. Whole-grain or whole-wheat pasta. Brown rice. [place] Morrow. Bulgur. Whole-grain and low-sodium cereals. Pita bread. Low-fat, low-sodium crackers. Whole-wheat flour tortillas. Vegetables Fresh or frozen vegetables (raw, steamed, roasted, or grilled). Low-sodium or reduced-sodium tomato and vegetable juice. Low-sodium or reduced-sodium tomato sauce and tomato paste. Low-sodium or reduced-sodium canned vegetables. Fruits All fresh, dried, or frozen fruit. Canned fruit in natural juice (without added sugar). Meat and other protein foods Skinless chicken or [place]. Ground chicken or [place]. Pork with  fat trimmed off. Fish and seafood. Egg whites. Dried beans, peas, or lentils. Unsalted nuts, nut butters, and seeds. Unsalted canned beans. Lean cuts of beef with fat trimmed off. Low-sodium, lean deli meat. Dairy Low-fat (1%) or fat-free (skim) milk. Fat-free, low-fat, or reduced-fat cheeses. Nonfat, low-sodium ricotta or cottage cheese. Low-fat or nonfat yogurt. Low-fat, low-sodium cheese. Fats and oils Soft margarine without trans fats. Vegetable oil. Low-fat, reduced-fat, or light mayonnaise and salad dressings (reduced-sodium). Canola, safflower, olive, soybean, and sunflower oils. Avocado. Seasoning and other foods Herbs. Spices. Seasoning mixes without salt. Unsalted popcorn and pretzels. Fat-free sweets. What foods are not recommended? The items listed may not be a complete list. Talk with your dietitian about what dietary choices are best for you. Grains Baked goods made with fat, such as croissants, muffins, or some breads. Dry pasta or rice meal packs. Vegetables Creamed or fried vegetables. Vegetables in a cheese sauce. Regular canned vegetables (not low-sodium or reduced-sodium). Regular canned tomato sauce and paste (not low-sodium or reduced-sodium). Regular tomato and vegetable juice (not low-sodium or reduced-sodium). Angie Fava. Olives. Fruits Canned fruit in a light or heavy syrup. Fried fruit. Fruit in cream or butter sauce. Meat and other protein foods Fatty cuts of meat. Ribs. Fried meat. Berniece Salines. Sausage. Bologna and other processed lunch meats. Salami. Fatback. Hotdogs. Bratwurst. Salted nuts and seeds. Canned beans with added salt. Canned or smoked fish. Whole eggs or egg yolks. Chicken or [place] with skin. Dairy Whole or 2% milk, cream, and half-and-half. Whole or full-fat cream cheese. Whole-fat or sweetened yogurt. Full-fat cheese. Nondairy creamers. Whipped toppings. Processed cheese and cheese spreads. Fats and oils Butter. Stick margarine. Lard. Shortening. Ghee. Bacon  fat. Tropical oils, such as coconut, palm kernel, or palm oil. Seasoning and other foods Salted popcorn and pretzels. Onion salt, garlic salt, seasoned salt, table salt, and sea salt. Worcestershire sauce. Tartar sauce. Barbecue sauce. Teriyaki sauce. Soy sauce, including reduced-sodium. Steak sauce. Canned and packaged gravies. Fish sauce. Oyster sauce. Cocktail sauce. Horseradish that you find on the shelf. Ketchup. Mustard. Meat flavorings and tenderizers. Bouillon cubes. Hot sauce and Tabasco sauce. Premade or packaged marinades. Premade or packaged taco seasonings. Relishes. Regular salad dressings. Where to find more information:  National Heart, Lung, and [place]: https://wilson-eaton.com/  American Heart Association: www.heart.org Summary  The DASH eating plan is a healthy eating plan that has been shown to reduce high blood pressure (hypertension). It may also reduce your risk for type 2 diabetes, heart disease, and stroke.  With the DASH eating plan, you should limit salt (sodium) intake to 2,300 mg a day. If you have hypertension, you may need to reduce your sodium intake to 1,500 mg a day.  When on the DASH eating plan, aim to eat more fresh fruits and vegetables, whole grains, lean proteins, low-fat dairy, and heart-healthy fats.  Work with your health care provider or diet and nutrition specialist (  dietitian) to adjust your eating plan to your individual calorie needs. This information is not intended to replace advice given to you by your health care provider. Make sure you discuss any questions you have with your health care provider. Document Revised: 11/17/2017 Document Reviewed: 11/28/2016 Elsevier Patient Education  [place].  Gastroesophageal Reflux Disease, Adult Gastroesophageal reflux (GER) happens when acid from the stomach flows up into the tube that connects the mouth and the stomach (esophagus). Normally, food travels down the esophagus and stays in the  stomach to be digested. With GER, food and stomach acid sometimes move back up into the esophagus. You may have a disease called gastroesophageal reflux disease (GERD) if the reflux:  Happens often.  Causes frequent or very bad symptoms.  Causes problems such as damage to the esophagus. When this happens, the esophagus becomes sore and swollen (inflamed). Over time, GERD can make small holes (ulcers) in the lining of the esophagus. What are the causes? This condition is caused by a problem with the muscle between the esophagus and the stomach. When this muscle is weak or not normal, it does not close properly to keep food and acid from coming back up from the stomach. The muscle can be weak because of:  Tobacco use.  Pregnancy.  Having a certain type of hernia (hiatal hernia).  Alcohol use.  Certain foods and drinks, such as coffee, chocolate, onions, and peppermint. What increases the risk? You are more likely to develop this condition if you:  Are overweight.  Have a disease that affects your connective tissue.  Use NSAID medicines. What are the signs or symptoms? Symptoms of this condition include:  Heartburn.  Difficult or painful swallowing.  The feeling of having a lump in the throat.  A bitter taste in the mouth.  Bad breath.  Having a lot of saliva.  Having an upset or bloated stomach.  Belching.  Chest pain. Different conditions can cause chest pain. Make sure you see your doctor if you have chest pain.  Shortness of breath or noisy breathing (wheezing).  Ongoing (chronic) cough or a cough at night.  Wearing away of the surface of teeth (tooth enamel).  Weight loss. How is this treated? Treatment will depend on how bad your symptoms are. Your doctor may suggest:  Changes to your diet.  Medicine.  Surgery. Follow these instructions at home: Eating and drinking   Follow a diet as told by your doctor. You may need to avoid foods and drinks  such as: ? Coffee and tea (with or without caffeine). ? Drinks that contain alcohol. ? Energy drinks and sports drinks. ? Bubbly (carbonated) drinks or sodas. ? Chocolate and cocoa. ? Peppermint and mint flavorings. ? Garlic and onions. ? Horseradish. ? Spicy and acidic foods. These include peppers, chili powder, curry powder, vinegar, hot sauces, and BBQ sauce. ? Citrus fruit juices and citrus fruits, such as oranges, lemons, and limes. ? Tomato-based foods. These include red sauce, chili, salsa, and pizza with red sauce. ? Fried and fatty foods. These include donuts, french fries, potato chips, and high-fat dressings. ? High-fat meats. These include hot dogs, rib eye steak, sausage, ham, and bacon. ? High-fat dairy items, such as whole milk, butter, and cream cheese.  Eat small meals often. Avoid eating large meals.  Avoid drinking large amounts of liquid with your meals.  Avoid eating meals during the 2-3 hours before bedtime.  Avoid lying down right after you eat.  Do not exercise  right after you eat. Lifestyle   Do not use any products that contain nicotine or tobacco. These include cigarettes, e-cigarettes, and chewing tobacco. If you need help quitting, ask your doctor.  Try to lower your stress. If you need help doing this, ask your doctor.  If you are overweight, lose an amount of weight that is healthy for you. Ask your doctor about a safe weight loss goal. General instructions  Pay attention to any changes in your symptoms.  Take over-the-counter and prescription medicines only as told by your doctor. Do not take aspirin, ibuprofen, or other NSAIDs unless your doctor says it is okay.  Wear loose clothes. Do not wear anything tight around your waist.  Raise (elevate) the head of your bed about 6 inches (15 cm).  Avoid bending over if this makes your symptoms worse.  Keep all follow-up visits as told by your doctor. This is important. Contact a doctor if:  You  have new symptoms.  You lose weight and you do not know why.  You have trouble swallowing or it hurts to swallow.  You have wheezing or a cough that keeps happening.  Your symptoms do not get better with treatment.  You have a hoarse voice. Get help right away if:  You have pain in your arms, neck, jaw, teeth, or back.  You feel sweaty, dizzy, or light-headed.  You have chest pain or shortness of breath.  You throw up (vomit) and your throw-up looks like blood or coffee grounds.  You pass out (faint).  Your poop (stool) is bloody or black.  You cannot swallow, drink, or eat. Summary  If a person has gastroesophageal reflux disease (GERD), food and stomach acid move back up into the esophagus and cause symptoms or problems such as damage to the esophagus.  Treatment will depend on how bad your symptoms are.  Follow a diet as told by your doctor.  Take all medicines only as told by your doctor. This information is not intended to replace advice given to you by your health care provider. Make sure you discuss any questions you have with your health care provider. Document Revised: 06/13/2018 Document Reviewed: 06/13/2018 Elsevier Patient Education  [place].

## 2020-02-14 ENCOUNTER — Other Ambulatory Visit: Payer: Self-pay | Admitting: Family Medicine

## 2020-02-19 ENCOUNTER — Telehealth: Payer: Self-pay

## 2020-02-19 DIAGNOSIS — H5213 Myopia, bilateral: Secondary | ICD-10-CM | POA: Diagnosis not present

## 2020-02-19 DIAGNOSIS — H524 Presbyopia: Secondary | ICD-10-CM | POA: Diagnosis not present

## 2020-02-19 DIAGNOSIS — E039 Hypothyroidism, unspecified: Secondary | ICD-10-CM

## 2020-02-19 DIAGNOSIS — Z961 Presence of intraocular lens: Secondary | ICD-10-CM | POA: Diagnosis not present

## 2020-02-19 DIAGNOSIS — H52203 Unspecified astigmatism, bilateral: Secondary | ICD-10-CM | POA: Diagnosis not present

## 2020-02-19 NOTE — Telephone Encounter (Signed)
Patient transitioning to [facility] for medication needs. Called Dr. Sanda Klein office and requested refills for amlodipine 10mg  and furosemide 40mg  be sent to [facility].    Verbal consent obtained for [facility] enhanced pharmacy services (medication synchronization, adherence packaging, delivery coordination). A medication sync plan was created to allow patient to get all medications delivered once every 30 to 90 days per patient preference. Patient understands they have freedom to choose pharmacy and clinical pharmacist will coordinate care between all prescribers and [facility].

## 2020-02-20 NOTE — Telephone Encounter (Signed)
I am requesting refills prescribed by Dr. Ethlyn Gallery be sent to [facility]. Patient is transitioning to this pharmacy, and I will be helping coordinate delivery, medication synchronization, and adherence packaging.   - Vitamin D2 50,000 units  - Duloxetine 30mg   - Levothyroxine 137 mcg  - Montelukast 10mg   - Simvastatin 20mg   - Myrbetriq 25mg    Thanks!

## 2020-02-20 NOTE — Telephone Encounter (Signed)
Ok for this? 

## 2020-02-26 ENCOUNTER — Other Ambulatory Visit: Payer: Self-pay | Admitting: Family Medicine

## 2020-02-26 MED ORDER — MONTELUKAST SODIUM 10 MG PO TABS: 10.0000 mg | ORAL_TABLET | Freq: Every day | ORAL | 0 refills | Status: DC

## 2020-02-26 MED ORDER — DULOXETINE HCL 30 MG PO CPEP: ORAL_CAPSULE | ORAL | 0 refills | Status: DC

## 2020-02-26 MED ORDER — MIRABEGRON ER 25 MG PO TB24: ORAL_TABLET | ORAL | 0 refills | Status: DC

## 2020-02-26 MED ORDER — LEVOTHYROXINE SODIUM 137 MCG PO TABS: ORAL_TABLET | ORAL | 0 refills | Status: DC

## 2020-02-26 MED ORDER — SIMVASTATIN 20 MG PO TABS: 20.0000 mg | ORAL_TABLET | Freq: Every day | ORAL | 3 refills | Status: DC

## 2020-02-26 NOTE — Telephone Encounter (Signed)
Simvastatin rx sent to pharmacy. Want to make sure amlodipine is written properly in chart. In my note I stated 7.5mg  daily. In chart it states 10mg  daily. Can you confirm what she is taking. If she is doing 10mg  daily then we can change rx to 10mg  tab. Thanks. Didn't look like this one needed to be sent for her, but just want it right in chart.

## 2020-02-26 NOTE — Telephone Encounter (Signed)
I was informed by the pharmacist today that she cannot send in prescriptions to the pharmacy.  Temporary Rx for vitamin D was previously sent to a local pharmacy in February.  I sent in Rxs for Duloxetine, Levothyroxine, Montelukast, Myrbetriq and message sent to PCP to send in the Rx for Simvastatin as this was not previously given by Dr Ethlyn Gallery.

## 2020-02-27 ENCOUNTER — Other Ambulatory Visit: Payer: Self-pay | Admitting: Family Medicine

## 2020-02-27 NOTE — Telephone Encounter (Signed)
You are correct! Just to summarize. After my visit with patient, I called Dr. Allean Found office (nephrologist) to clarify dose of amlodipine since patient reported taking amlodipine 10mg , 1.5 tablets daily (15mg /day). Patient is supposed to be taking 10mg , 1 tablet daily, which then I reported back to patient.  I also requested refills for amlodipine and furosemide from Dr. Allean Found office to be sent to [facility] since she manages those medications.

## 2020-02-27 NOTE — Telephone Encounter (Signed)
Did you guys discuss sending amlodipine to the pharmacy with her other meds? I didn't see that one in the message and just wanted to make sure it didn't get left out. Thanks!

## 2020-02-27 NOTE — Telephone Encounter (Signed)
noted 

## 2020-02-27 NOTE — Telephone Encounter (Signed)
Spoke with the pt and she stated she is currently taking Amlodipine 10mg  once a day.  Message sent to PCP.

## 2020-03-03 ENCOUNTER — Other Ambulatory Visit: Payer: Self-pay

## 2020-03-04 ENCOUNTER — Other Ambulatory Visit (INDEPENDENT_AMBULATORY_CARE_PROVIDER_SITE_OTHER): Payer: PPO

## 2020-03-04 DIAGNOSIS — N184 Chronic kidney disease, stage 4 (severe): Secondary | ICD-10-CM

## 2020-03-04 DIAGNOSIS — D72819 Decreased white blood cell count, unspecified: Secondary | ICD-10-CM

## 2020-03-04 LAB — CBC WITH DIFFERENTIAL/PLATELET
Basophils Absolute: 0 10*3/uL (ref 0.0–0.1)
Basophils Relative: 0.4 % (ref 0.0–3.0)
Eosinophils Absolute: 0.2 10*3/uL (ref 0.0–0.7)
Eosinophils Relative: 3.3 % (ref 0.0–5.0)
HCT: 41.3 % (ref 36.0–46.0)
Hemoglobin: 13.5 g/dL (ref 12.0–15.0)
Lymphocytes Relative: 29.1 % (ref 12.0–46.0)
Lymphs Abs: 1.9 10*3/uL (ref 0.7–4.0)
MCHC: 32.6 g/dL (ref 30.0–36.0)
MCV: 87.6 fl (ref 78.0–100.0)
Monocytes Absolute: 0.4 10*3/uL (ref 0.1–1.0)
Monocytes Relative: 6.6 % (ref 3.0–12.0)
Neutro Abs: 3.9 10*3/uL (ref 1.4–7.7)
Neutrophils Relative %: 60.6 % (ref 43.0–77.0)
Platelets: 221 10*3/uL (ref 150.0–400.0)
RBC: 4.72 Mil/uL (ref 3.87–5.11)
RDW: 16 % — ABNORMAL HIGH (ref 11.5–15.5)
WBC: 6.5 10*3/uL (ref 4.0–10.5)

## 2020-03-04 LAB — COMPREHENSIVE METABOLIC PANEL
ALT: 13 U/L (ref 0–35)
AST: 14 U/L (ref 0–37)
Albumin: 3.5 g/dL (ref 3.5–5.2)
Alkaline Phosphatase: 131 U/L — ABNORMAL HIGH (ref 39–117)
BUN: 20 mg/dL (ref 6–23)
CO2: 27 mEq/L (ref 19–32)
Calcium: 10.6 mg/dL — ABNORMAL HIGH (ref 8.4–10.5)
Chloride: 105 mEq/L (ref 96–112)
Creatinine, Ser: 1.4 mg/dL — ABNORMAL HIGH (ref 0.40–1.20)
GFR: 35.96 mL/min — ABNORMAL LOW (ref >60.00)
Glucose, Bld: 96 mg/dL (ref 70–99)
Potassium: 4.1 mEq/L (ref 3.5–5.1)
Sodium: 139 mEq/L (ref 135–145)
Total Bilirubin: 0.5 mg/dL (ref 0.2–1.2)
Total Protein: 6.4 g/dL (ref 6.0–8.3)

## 2020-03-05 ENCOUNTER — Other Ambulatory Visit: Payer: Self-pay | Admitting: Family Medicine

## 2020-03-19 ENCOUNTER — Ambulatory Visit: Payer: Self-pay

## 2020-03-19 NOTE — Chronic Care Management (AMB) (Signed)
  Chronic Care Management Pharmacy  Name: [name]  MRN: [id number] DOB: Nov 07, 1935   Reviewed patient's office visits, consult notes, and hospital visits.  Last chart BP: 128/80 (01/29/2020). No medication changes indicated.  Patient received levothyroxine 137 mcg, filled enough for sync date 04/26/20.   Called patient to review medications in detail and coordinate delivery dates.  Patient unable to provide quantity of medications. Patient has not been checking BP since dose of amlodipine was decreased from 15mg  to 10mg  daily. Will call patient back in a week to reassess

## 2020-03-21 ENCOUNTER — Other Ambulatory Visit: Payer: Self-pay | Admitting: Family Medicine

## 2020-03-21 DIAGNOSIS — E039 Hypothyroidism, unspecified: Secondary | ICD-10-CM

## 2020-04-14 DIAGNOSIS — L720 Epidermal cyst: Secondary | ICD-10-CM | POA: Diagnosis not present

## 2020-04-24 NOTE — Chronic Care Management (AMB) (Signed)
Reviewed chart for medication changes ahead of medication coordination call.  No OVs, Consults, or hospital visits since last care coordination call.   No medication changes indicated OR if recent visit, treatment plan here.  BP Readings from Last 3 Encounters:  01/29/20 128/80  12/31/19 (!) 150/68  07/24/19 (!) 160/90    No results found for: HGBA1C   Patient will be starting Adherence Packaging for  90 Days   Patient is due for next adherence delivery on: 04/27/2020.  Called patient and reviewed medications and coordinated delivery.  This delivery to include: Levothyroxine 117mcg daily- 1 tablet before Breakfast  Duloxetine 30mg  daily- 1 tablet before Breakfast  Montelukast 10mg  daily-1 tablet before Breakfast  Amlodipine 10mg  daily- 1 tablet before Breakfast  Furosemide 40mg - 1 tablet Monday, Wednesday, Friday -Before Breakfast  Mybetriq 25mg  daily- 1 tablet at bedtime  Simvastatin 20mg  daily- 1 tablet at bedtime  Xyzal 5mg , 1 tablet at bedtime (OTC)   Patient requested vitamin D 50,000 units. Patient instructed to take vitamin D 1,000 units once daily once she finishes supply of vitamin D 50,000 units (per Dr. Berenice Bouton plan of vitamin D deficiency).   Patient declined the following medications: - esomeprazole   Patient needs refills for levothyroxine (current refill in pharmacy not enough for 90DS).  Confirmed delivery date of 04/27/20, advised patient that pharmacy will contact them the morning of delivery or day before.   Anson Crofts, PharmD Clinical Pharmacist [facility] [place] [phone]

## 2020-04-29 ENCOUNTER — Other Ambulatory Visit: Payer: Self-pay

## 2020-04-29 ENCOUNTER — Telehealth: Payer: Self-pay

## 2020-04-29 DIAGNOSIS — E039 Hypothyroidism, unspecified: Secondary | ICD-10-CM

## 2020-04-29 MED ORDER — LEVOTHYROXINE SODIUM 137 MCG PO TABS: ORAL_TABLET | ORAL | 0 refills | Status: DC

## 2020-04-29 NOTE — Telephone Encounter (Signed)
Done

## 2020-04-29 NOTE — Telephone Encounter (Signed)
Hi!  Can you send a refill for levothyroxine 176mcg, 1 tablet daily before breakfast for 90 day supply to [facility]?  Patient is to start packaging for 90 day supply and current prescription at pharmacy does not have enough to cover this.    Thanks!

## 2020-05-17 ENCOUNTER — Other Ambulatory Visit: Payer: Self-pay | Admitting: Family Medicine

## 2020-05-17 DIAGNOSIS — E039 Hypothyroidism, unspecified: Secondary | ICD-10-CM

## 2020-05-17 MED ORDER — LEVOTHYROXINE SODIUM 137 MCG PO TABS: ORAL_TABLET | ORAL | 1 refills | Status: DC

## 2020-05-18 ENCOUNTER — Other Ambulatory Visit: Payer: Self-pay | Admitting: Family Medicine

## 2020-06-07 ENCOUNTER — Other Ambulatory Visit: Payer: Self-pay | Admitting: Family Medicine

## 2020-06-08 ENCOUNTER — Telehealth: Payer: Self-pay | Admitting: *Deleted

## 2020-06-08 ENCOUNTER — Telehealth: Payer: Self-pay

## 2020-06-08 NOTE — Telephone Encounter (Signed)
-----   Message from Caren Macadam, MD sent at 06/08/2020  3:15 PM EDT ----- I received pharmacy refill request for amlodipine 5 mg.  There seems to be confusion on dose that she is taking.  In my prior note she was on 7.5 mg of amlodipine, but I see in pharmacy note she was taking 15 mg of amlodipine.  She has both doses of 5 mg and 10 mg.  Since [place] last talked with her, I figured I would let her straighten out current dose that she is taking.  If blood pressures are well controlled and she is needing a refill, I would also be okay with this.  She should not be taking more than 10 mg of amlodipine.  JoAnne Cc'd since I refused rx refill until clarified.   Thanks!

## 2020-06-08 NOTE — Telephone Encounter (Signed)
Noted  

## 2020-06-08 NOTE — Telephone Encounter (Signed)
Patient was called to clarify amlodipine dose she is taking.   Patient denied attempting refill for amlodipine at [facility]. Most likely refill request received for amlodipine 5mg  was an automatic request.   Refill delivery coordinated for 06/09/2020 with [facility] for amlodipine 10mg , 1 tablet daily (fill enough to get patient to sync date). At next sync date, amlodipine 10mg  to be included in packaging along to be refilled with other medications.   Anson Crofts, PharmD Clinical Pharmacist [facility] [place] [phone]

## 2020-06-08 NOTE — Telephone Encounter (Signed)
The refill request was sent from [facility]. Patient has since then switched to [facility].   Yes, at last visit, it was found patient was taking amlodipine 15mg  once daily (which is over daily maximum). This was then verified with nephrology office. Per nephrology office, patient is to take amlodipine 10mg  once daily, and patient was instructed to make change.   I am unable to remove medications from med list.  Mechele Claude, can you update medication list (remove amlodipine 5mg  once daily). Patient is to only be taking amlodipine 10mg  once daily.   I have also called patient to clarify this and make sure she is not the one requesting the refill. As well coordinating refill with [facility] if medication is needed.   I have also called [facility] to update them of medication changes.   Thanks!

## 2020-06-08 NOTE — Telephone Encounter (Signed)
-----   Message from Caren Macadam, MD sent at 06/08/2020  3:15 PM EDT ----- I received pharmacy refill request for amlodipine 5 mg.  There seems to be confusion on dose that she is taking.  In my prior note she was on 7.5 mg of amlodipine, but I see in pharmacy note she was taking 15 mg of amlodipine.  She has both doses of 5 mg and 10 mg.  Since Hair last talked with her, I figured I would let her straighten out current dose that she is taking.  If blood pressures are well controlled and she is needing a refill, I would also be okay with this.  She should not be taking more than 10 mg of amlodipine.  JoAnne Cc'd since I refused rx refill until clarified.   Thanks!

## 2020-06-09 NOTE — Telephone Encounter (Signed)
Rx discontinued as below.

## 2020-06-09 NOTE — Addendum Note (Signed)
Addended by: Agnes Lawrence on: 06/09/2020 10:09 AM   Modules accepted: Orders

## 2020-07-20 ENCOUNTER — Telehealth: Payer: Self-pay

## 2020-07-20 DIAGNOSIS — I10 Essential (primary) hypertension: Secondary | ICD-10-CM

## 2020-07-20 NOTE — Progress Notes (Addendum)
Chronic Care Management Pharmacy Assistant   Name: [name]  MRN: [id number] DOB: 1935/01/19  Reason for Encounter: Medication Review  PCP : Caren Macadam, MD  Allergies:  No Known Allergies  Medications: Outpatient Encounter Medications as of 07/20/2020  Medication Sig Note  . amLODipine (NORVASC) 10 MG tablet Take 10 mg by mouth daily.   . DULoxetine (CYMBALTA) 30 MG capsule TAKE 1 CAPSULE BY MOUTH EVERY DAY   . esomeprazole (NEXIUM) 40 MG capsule Take 1 capsule (40 mg total) by mouth daily before breakfast. (Patient taking differently: Take 40 mg by mouth daily before breakfast. Takes 20mg  once daily)   . furosemide (LASIX) 40 MG tablet Patient reports taking three times a week 01/26/2016: Received from: External Pharmacy  . HYDROcodone-acetaminophen (NORCO) 5-325 MG tablet Take 1-2 tablets by mouth every 6 (six) hours as needed for severe pain. (Patient not taking: Reported on 02/11/2020)   . levothyroxine (SYNTHROID) 137 MCG tablet Take 1 tablet by mouth daily   . mirabegron ER (MYRBETRIQ) 25 MG TB24 tablet START WITH 1 TABLET BY MOUTH ONCE DAILY, INCREASE TO 2 TABLETS DAILY IN 1 WEEK IF INCONTINENCE PERSISTS   . montelukast (SINGULAIR) 10 MG tablet TAKE 1 TABLET BY MOUTH EVERY DAY   . ondansetron (ZOFRAN) 4 MG tablet Take 1 tablet (4 mg total) by mouth every 8 (eight) hours as needed for nausea or vomiting. (Patient not taking: Reported on 02/11/2020)   . simvastatin (ZOCOR) 20 MG tablet Take 1 tablet (20 mg total) by mouth daily.   . Vitamin D, Ergocalciferol, (DRISDOL) 1.25 MG (50000 UNIT) CAPS capsule Take 1 capsule (50,000 Units total) by mouth every 7 (seven) days.    No facility-administered encounter medications on file as of 07/20/2020.    Current Diagnosis: Patient Active Problem List   Diagnosis Date Noted  . Sinusitis chronic, frontal 11/06/2015  . Cough variant asthma 09/23/2015  . Chronic cough 04/09/2015  . Acute renal failure superimposed on stage 4  chronic kidney disease ([place]) 04/09/2015  . Obesity 11/22/2011  . History of colonic polyps 11/06/2009  . NEPHROLITHIASIS, HX OF 08/07/2008  . Osteoarthritis 05/07/2008  . Disorder resulting from impaired renal function 05/06/2008  . DOE (dyspnea on exertion) 05/06/2008  . HYPERCHOLESTEROLEMIA, PURE 07/09/2007  . Hypothyroidism 06/18/2007  . Essential hypertension 06/18/2007  . Asthma 06/18/2007  . GERD 06/18/2007    Goals Addressed   None     Follow-Up:  Coordination of Enhanced Pharmacy Services  Reviewed chart for medication changes ahead of medication coordination call.  No OVs, Consults, or hospital visits since last care coordination call.  No medication changes indicated.   BP Readings from Last 3 Encounters:  01/29/20 128/80  12/31/19 (!) 150/68  07/24/19 (!) 160/90    No results found for: HGBA1C   Patient obtains medications through Ad[facility] Packaging  90 Days   Last adherence delivery included:  [name] Kitchen Duloxetine 30mg : one tab before breakfast . Levothyroxine 149mcg; one tab before breakfast . Amlodipine 10mg ; one tab before breakfast . Furosemide 40mg ; one tab before breakfast Monday, Wednesday, Friday . Montelukast 10mg ; one tab before breakfast . Simvastatin 20mg ; one tab at bedtime . Myrbetriq 25mg ; one tab at bedtime   Patient is due for next adherence delivery on: 07-23-2020.  Called patient and reviewed medications and coordinated delivery.  This delivery to include: Duloxetine 30mg : one tab before breakfast Levothyroxine 119mcg; one tab before breakfast Amlodipine 10mg ; one tab before breakfast Furosemide; one tab before breakfast Monday,  Wednesday, Friday Montelukast 10mg ; one tab before breakfast Simvastatin 20mg ; one tab at bedtime Myrbetriq 25mg ; one tab at bedtime   Patient needs refills for:  Duloxetine 30mg  tab Montelukast 10mg  tab Myrbetriq 25mg .  Prescriber: Dr. Ethlyn Gallery.  Confirmed delivery date of 8-5 -2021. Advised patient that  pharmacy will contact them the morning of delivery.   Fanny Skates, [place] Clinical pharmacist assistant  [phone]

## 2020-07-22 ENCOUNTER — Other Ambulatory Visit: Payer: Self-pay | Admitting: Family Medicine

## 2020-07-25 ENCOUNTER — Other Ambulatory Visit: Payer: Self-pay | Admitting: Family Medicine

## 2020-07-25 MED ORDER — DULOXETINE HCL 30 MG PO CPEP: ORAL_CAPSULE | ORAL | 3 refills | Status: DC

## 2020-07-25 MED ORDER — MONTELUKAST SODIUM 10 MG PO TABS: ORAL_TABLET | ORAL | 3 refills | Status: DC

## 2020-07-25 MED ORDER — MIRABEGRON ER 25 MG PO TB24: ORAL_TABLET | ORAL | 3 refills | Status: DC

## 2020-07-29 ENCOUNTER — Ambulatory Visit (INDEPENDENT_AMBULATORY_CARE_PROVIDER_SITE_OTHER): Payer: PPO | Admitting: Family Medicine

## 2020-07-29 ENCOUNTER — Encounter: Payer: Self-pay | Admitting: Family Medicine

## 2020-07-29 VITALS — BP 160/100 | HR 96 | Temp 98.0°F | Ht 64.75 in | Wt 237.8 lb

## 2020-07-29 DIAGNOSIS — I10 Essential (primary) hypertension: Secondary | ICD-10-CM

## 2020-07-29 DIAGNOSIS — E039 Hypothyroidism, unspecified: Secondary | ICD-10-CM

## 2020-07-29 DIAGNOSIS — E559 Vitamin D deficiency, unspecified: Secondary | ICD-10-CM | POA: Diagnosis not present

## 2020-07-29 DIAGNOSIS — E7800 Pure hypercholesterolemia, unspecified: Secondary | ICD-10-CM

## 2020-07-29 DIAGNOSIS — Z0000 Encounter for general adult medical examination without abnormal findings: Secondary | ICD-10-CM

## 2020-07-29 NOTE — Progress Notes (Signed)
[name] DOB: Jul 15, 1935 Encounter date: 07/29/2020  This is a 85 y.o. female who presents for complete physical   History of present illness/Additional concerns: HTN: has not been checking at home regularly. Amlodipine  Asthma: has been doing pretty well. Little allergy cough. No fevers, chills. Does still take the montelukast. Hasn't used inhalers at all.   Vit D def: has been taking the 50,000 supplement.   Hasn't needed furosemide because hasn't had swelling.   Nephrologist has retired, so she hasn't gone back for visit.  States she will make appointment.  Past Medical History:  Diagnosis Date  . Asthma    no problem in last year  . COLONIC POLYPS, HX OF 11/06/2009  . Cough    nonproductive   . DYSPNEA ON EXERTION 05/06/2008  . GERD 06/18/2007  . Goiter    hx of 1973 removed  . HYPERCHOLESTEROLEMIA, PURE 07/09/2007  . HYPERTENSION 06/18/2007  . HYPOTHYROIDISM 06/18/2007  . Insomnia   . NEPHROLITHIASIS, HX OF 08/07/2008  . Obesity   . OSTEOARTHRITIS 05/07/2008  . Renal cyst   . RENAL INSUFFICIENCY 05/06/2008   Past Surgical History:  Procedure Laterality Date  . ABDOMINAL HYSTERECTOMY     partial  . CARPAL TUNNEL RELEASE Right 12/31/2019   Procedure: CARPAL TUNNEL RELEASE ENDOSCOPIC;  Surgeon: Renette Butters, MD;  [facility];  Service: Orthopedics;  Laterality: Right;  . CATARACT EXTRACTION Bilateral   . JOINT REPLACEMENT     bilateral knees  . KNEE ARTHROSCOPY Left   . LITHOTRIPSY    . NASAL SINUS SURGERY  1990  . SINUS ENDO W/FUSION Right 11/06/2015   Procedure: ENDOSCOPIC SINUS SURGERY WITH NAVIGATION;  Surgeon: Melissa Montane, MD;  Location: [facility] OR;  Service: ENT;  Laterality: Right;  Endoscopic sinus surgery with Fusion protocol, right frontal and ethmoid sinusotomy   . TONSILLECTOMY    . TOTAL KNEE ARTHROPLASTY     No Known Allergies Current Meds  Medication Sig  . amLODipine (NORVASC) 10 MG tablet Take 10 mg by mouth daily.  .  DULoxetine (CYMBALTA) 30 MG capsule TAKE ONE CAPSULE BY MOUTH BEFORE BREAKFAST  . esomeprazole (NEXIUM) 40 MG capsule Take 1 capsule (40 mg total) by mouth daily before breakfast. (Patient taking differently: Take 40 mg by mouth daily before breakfast. Takes '20mg'$  once daily)  . furosemide (LASIX) 40 MG tablet Patient reports taking three times a week  . HYDROcodone-acetaminophen (NORCO) 5-325 MG tablet Take 1-2 tablets by mouth every 6 (six) hours as needed for severe pain.  [name] Kitchen levothyroxine (SYNTHROID) 137 MCG tablet Take 1 tablet by mouth daily  . mirabegron ER (MYRBETRIQ) 25 MG TB24 tablet TAKE ONE TABLET BY MOUTH EVERYDAY AT BEDTIME  . montelukast (SINGULAIR) 10 MG tablet TAKE ONE TABLET BY MOUTH BEFORE BREAKFAST  . ondansetron (ZOFRAN) 4 MG tablet Take 1 tablet (4 mg total) by mouth every 8 (eight) hours as needed for nausea or vomiting.  . simvastatin (ZOCOR) 20 MG tablet Take 1 tablet (20 mg total) by mouth daily.  . [DISCONTINUED] Vitamin D, Ergocalciferol, (DRISDOL) 1.25 MG (50000 UNIT) CAPS capsule Take 1 capsule (50,000 Units total) by mouth every 7 (seven) days.   Social History   Tobacco Use  . Smoking status: Former Smoker    Packs/day: 0.25    Years: 6.00    Pack years: 1.50    Types: Cigarettes    Quit date: 12/19/1978    Years since quitting: 41.6  . Smokeless tobacco: Never Used  Substance Use Topics  . Alcohol use: Yes    Alcohol/week: 0.0 standard drinks    Comment: rare   Family History  Problem Relation Age of Onset  . Heart Problems Father   . Multiple myeloma Other   . Colon cancer Other   . Hypertension Neg Hx        family  . Cancer Neg Hx        colon ca , prostate ca     Review of Systems  Constitutional: Negative for activity change, appetite change, chills, fatigue, fever and unexpected weight change.  HENT: Negative for congestion, ear pain, hearing loss, sinus pressure, sinus pain, sore throat and trouble swallowing.   Eyes: Negative for pain and  visual disturbance.  Respiratory: Negative for cough, chest tightness, shortness of breath and wheezing.   Cardiovascular: Negative for chest pain, palpitations and leg swelling.  Gastrointestinal: Negative for abdominal pain, blood in stool, constipation, diarrhea, nausea and vomiting.  Genitourinary: Negative for difficulty urinating and menstrual problem.  Musculoskeletal: Negative for arthralgias and back pain.  Skin: Negative for rash.  Neurological: Negative for dizziness, weakness, numbness and headaches.  Hematological: Negative for adenopathy. Does not bruise/bleed easily.  Psychiatric/Behavioral: Negative for sleep disturbance and suicidal ideas. The patient is not nervous/anxious.     CBC:  Lab Results  Component Value Date   WBC 5.4 07/29/2020   HGB 14.2 07/29/2020   HCT 43.4 07/29/2020   MCH 28.3 07/29/2020   MCHC 32.7 07/29/2020   RDW 14.4 07/29/2020   PLT 226 07/29/2020   MPV 10.2 07/29/2020   MPV 7.1 12/16/2016   CMP: Lab Results  Component Value Date   NA 141 07/29/2020   K 4.3 07/29/2020   CL 106 07/29/2020   CO2 24 07/29/2020   ANIONGAP 6 10/29/2015   GLUCOSE 103 (H) 07/29/2020   GLUCOSE 99 10/27/2006   BUN 16 07/29/2020   CREATININE 1.59 (H) 07/29/2020   GFRAA 32 (L) 11/06/2015   CALCIUM 10.8 (H) 07/29/2020   PROT 5.9 (L) 07/29/2020   BILITOT 0.6 07/29/2020   ALKPHOS 131 (H) 03/04/2020   ALT 9 07/29/2020   AST 13 07/29/2020   LIPID: Lab Results  Component Value Date   CHOL 218 (H) 07/29/2020   TRIG 160 (H) 07/29/2020   TRIG 138 10/27/2006   HDL 55 07/29/2020   LDLCALC 133 (H) 07/29/2020    Objective:  BP (!) 160/100 (BP Location: Right Arm, Patient Position: Sitting, Cuff Size: Large)   Pulse 96   Temp 98 F (36.7 C) (Oral)   Ht 5' 4.75" (1.645 m)   Wt 237 lb 12.8 oz (107.9 kg)   BMI 39.88 kg/m   Weight: 237 lb 12.8 oz (107.9 kg)   BP Readings from Last 3 Encounters:  07/29/20 (!) 160/100  01/29/20 128/80  12/31/19 (!) 150/68    Wt Readings from Last 3 Encounters:  07/29/20 237 lb 12.8 oz (107.9 kg)  01/29/20 229 lb 8 oz (104.1 kg)  12/31/19 238 lb 8 oz (108.2 kg)    Physical Exam Constitutional:      General: She is not in acute distress.    Appearance: She is well-developed. She is obese.  HENT:     Head: Normocephalic and atraumatic.     Right Ear: External ear normal.     Left Ear: External ear normal.     Mouth/Throat:     Pharynx: No oropharyngeal exudate.  Eyes:     Conjunctiva/sclera: Conjunctivae normal.  Pupils: Pupils are equal, round, and reactive to light.  Neck:     Thyroid: No thyromegaly.  Cardiovascular:     Rate and Rhythm: Normal rate and regular rhythm.     Heart sounds: Normal heart sounds. No murmur heard.  No friction rub. No gallop.   Pulmonary:     Effort: Pulmonary effort is normal.     Breath sounds: Normal breath sounds.  Abdominal:     General: Bowel sounds are normal. There is no distension.     Palpations: Abdomen is soft. There is no mass.     Tenderness: There is no abdominal tenderness. There is no guarding.     Hernia: No hernia is present.  Musculoskeletal:        General: No tenderness or deformity. Normal range of motion.     Cervical back: Normal range of motion and neck supple.  Lymphadenopathy:     Cervical: No cervical adenopathy.  Skin:    General: Skin is warm and dry.     Findings: No rash.  Neurological:     Mental Status: She is alert and oriented to person, place, and time.     Deep Tendon Reflexes: Reflexes normal.     Reflex Scores:      Tricep reflexes are 2+ on the right side and 2+ on the left side.      Bicep reflexes are 2+ on the right side and 2+ on the left side.      Brachioradialis reflexes are 2+ on the right side and 2+ on the left side.      Patellar reflexes are 2+ on the right side and 2+ on the left side. Psychiatric:        Speech: Speech normal.        Behavior: Behavior normal.        Thought Content: Thought  content normal.     Assessment/Plan: Health Maintenance Due  Topic Date Due  . INFLUENZA VACCINE  07/19/2020   Health Maintenance reviewed.  1. Preventative health care Encouraged daily exercise and activity.  Encouraged healthy eating.  2. HYPERCHOLESTEROLEMIA, PURE Continue with simvastatin. Will recheck for baseline. - Lipid panel; Future - TSH; Future - TSH - Lipid panel  3. Acquired hypothyroidism Taking synthroid 181mg daily. Recheck levels.  4. Essential hypertension Elevated today; she got lost getting here, essentially missed appointment time. Will have her check at home and report back to [place]  - CBC with Differential/Platelet; Future - Comprehensive metabolic panel; Future - Comprehensive metabolic panel - CBC with Differential/Platelet  5. Vitamin D deficiency - VITAMIN D 25 Hydroxy (Vit-D Deficiency, Fractures); Future - VITAMIN D 25 Hydroxy (Vit-D Deficiency, Fractures)  Return in about 6 months (around 01/29/2021) for Chronic condition visit.  JMicheline Rough MD

## 2020-07-29 NOTE — Patient Instructions (Addendum)
Once your prescription vitamin D runs out, you can switch to over the counter 1000-2000 units daily (you might be ok with a women's once a day)

## 2020-07-30 ENCOUNTER — Other Ambulatory Visit: Payer: Self-pay | Admitting: Family Medicine

## 2020-07-30 DIAGNOSIS — I10 Essential (primary) hypertension: Secondary | ICD-10-CM

## 2020-07-30 DIAGNOSIS — E039 Hypothyroidism, unspecified: Secondary | ICD-10-CM

## 2020-07-30 LAB — CBC WITH DIFFERENTIAL/PLATELET
Absolute Monocytes: 378 cells/uL (ref 200–950)
Basophils Absolute: 22 cells/uL (ref 0–200)
Basophils Relative: 0.4 %
Eosinophils Absolute: 130 cells/uL (ref 15–500)
Eosinophils Relative: 2.4 %
HCT: 43.4 % (ref 35.0–45.0)
Hemoglobin: 14.2 g/dL (ref 11.7–15.5)
Lymphs Abs: 1339 cells/uL (ref 850–3900)
MCH: 28.3 pg (ref 27.0–33.0)
MCHC: 32.7 g/dL (ref 32.0–36.0)
MCV: 86.6 fL (ref 80.0–100.0)
MPV: 10.2 fL (ref 7.5–12.5)
Monocytes Relative: 7 %
Neutro Abs: 3532 cells/uL (ref 1500–7800)
Neutrophils Relative %: 65.4 %
Platelets: 226 10*3/uL (ref 140–400)
RBC: 5.01 10*6/uL (ref 3.80–5.10)
RDW: 14.4 % (ref 11.0–15.0)
Total Lymphocyte: 24.8 %
WBC: 5.4 10*3/uL (ref 3.8–10.8)

## 2020-07-30 LAB — COMPREHENSIVE METABOLIC PANEL
AG Ratio: 1.7 (calc) (ref 1.0–2.5)
ALT: 9 U/L (ref 6–29)
AST: 13 U/L (ref 10–35)
Albumin: 3.7 g/dL (ref 3.6–5.1)
Alkaline phosphatase (APISO): 127 U/L (ref 37–153)
BUN/Creatinine Ratio: 10 (calc) (ref 6–22)
BUN: 16 mg/dL (ref 7–25)
CO2: 24 mmol/L (ref 20–32)
Calcium: 10.8 mg/dL — ABNORMAL HIGH (ref 8.6–10.4)
Chloride: 106 mmol/L (ref 98–110)
Creat: 1.59 mg/dL — ABNORMAL HIGH (ref 0.60–0.88)
Globulin: 2.2 g/dL (calc) (ref 1.9–3.7)
Glucose, Bld: 103 mg/dL — ABNORMAL HIGH (ref 65–99)
Potassium: 4.3 mmol/L (ref 3.5–5.3)
Sodium: 141 mmol/L (ref 135–146)
Total Bilirubin: 0.6 mg/dL (ref 0.2–1.2)
Total Protein: 5.9 g/dL — ABNORMAL LOW (ref 6.1–8.1)

## 2020-07-30 LAB — TSH: TSH: 13.33 mIU/L — ABNORMAL HIGH (ref 0.40–4.50)

## 2020-07-30 LAB — VITAMIN D 25 HYDROXY (VIT D DEFICIENCY, FRACTURES): Vit D, 25-Hydroxy: 22 ng/mL — ABNORMAL LOW (ref 30–100)

## 2020-07-30 LAB — LIPID PANEL
Cholesterol: 218 mg/dL — ABNORMAL HIGH (ref <200)
HDL: 55 mg/dL (ref >=50)
LDL Cholesterol (Calc): 133 mg/dL (calc) — ABNORMAL HIGH
Non-HDL Cholesterol (Calc): 163 mg/dL (calc) — ABNORMAL HIGH (ref <130)
Total CHOL/HDL Ratio: 4 (calc) (ref <5.0)
Triglycerides: 160 mg/dL — ABNORMAL HIGH (ref <150)

## 2020-08-05 ENCOUNTER — Other Ambulatory Visit: Payer: Self-pay | Admitting: Family Medicine

## 2020-08-05 MED ORDER — LEVOTHYROXINE SODIUM 150 MCG PO TABS: 150.0000 ug | ORAL_TABLET | Freq: Every day | ORAL | 1 refills | Status: DC

## 2020-08-10 ENCOUNTER — Ambulatory Visit: Payer: PPO

## 2020-08-11 ENCOUNTER — Telehealth: Payer: PPO

## 2020-09-02 DIAGNOSIS — Z1231 Encounter for screening mammogram for malignant neoplasm of breast: Secondary | ICD-10-CM | POA: Diagnosis not present

## 2020-09-02 LAB — HM MAMMOGRAPHY

## 2020-09-04 ENCOUNTER — Ambulatory Visit: Payer: PPO

## 2020-09-10 ENCOUNTER — Encounter: Payer: Self-pay | Admitting: Family Medicine

## 2020-09-15 ENCOUNTER — Ambulatory Visit: Payer: PPO

## 2020-09-15 DIAGNOSIS — E039 Hypothyroidism, unspecified: Secondary | ICD-10-CM

## 2020-09-15 DIAGNOSIS — I10 Essential (primary) hypertension: Secondary | ICD-10-CM

## 2020-09-15 NOTE — Patient Instructions (Addendum)
Hi [name],  It was such a pleasure getting to speak with you over the phone. As discussed, here are a couple things to remember from our visit:  1. As soon as you get levothyroxine 150 mcg from [facility], start taking and REMOVE levothyroxine 137 mcg tablets from packaging. Make sure to only take the 150 mcg tablets on an empty stomach before all other medicines with water.  2. Start checking your blood pressure a couple times each week and keep a log so we can make sure the amlodipine is working for you.  3. Start taking esomeprazole (Nexium) every other day to see if you really need to stay on this medicine. You may experience some heartburn for a week or two but it shouldn't persist beyond that unless you really need the medicine.  I am going to have my assistant Mimi reach out to you in a couple of weeks to make sure everything is going well!  Please call me if you need anything in the meantime!  Best, Maddie  [name], PharmD Clinical Pharmacist [place] [facility] [place]   Visit Information  Goals Addressed            This Visit's Progress   . Pharmacy Care Plan       Current Barriers:  . Chronic Disease Management support, education, and care coordination needs related to HTN and HLD, Asthma, GERD, Hypothyroidism, Osteoarthritis, CKD, stage 4, Urinary incontinence   Pharmacist Clinical Goal(s):  [name] Kitchen Maintain Blood pressure <130/80 mmHg  . Prevent worsening of shortness or breath and hospitalizations . Cholesterol goals: Total Cholesterol goal under 200, Triglycerides goal under 150, HDL goal above 40 (men) or above 50 (women), LDL goal under 100.  . Maintain TSH between 0.45 to 4.5uIU/ml . Minimize reflux symptoms  . Maintain Vitamin D-25 level at or above 30.  . Reduce urinary symptoms    Interventions: . Comprehensive medication review performed. . Collaboration with provider re: medication management . Discussed diet and exercise  modifications and effect on blood pressure and cholesterol. . Discussed DASH diet:  following a diet emphasizing fruits and vegetables and low-fat dairy products along with whole grains, fish, poultry, and nuts. Reducing red meats and sugars.  . Discussed modifying their lifestyle, including initial attempts to lose 5 to 10% of body weight.   Engaging in at least 150 minutes per week of moderate-intensity exercise such as brisk walking (15- to 20-minute mile) or something similar. Recommended they incorporate flexibility, balance, and some type of strength training exercises.    . Discussed non-pharmacological interventions for acid reflux. Take measures to prevent acid reflux, such as avoiding spicy foods, avoiding caffeine, avoid laying down a few hours after eating, and raising the head of the bed.  Patient Self Care Activities:  . Calls provider office for new concerns or questions . Continue taking medications as directed by your providers.  . Make sure you are taking vitamin D3 2,000 units once daily. . Check your blood pressure at home at least twice weekly to make sure your medications are working properly. . Stop taking Levothyroxine 137 mcg and start taking levothyroxine 150 mcg dose.  . Decrease to taking esomeprazole every other day.  Please see past updates related to this goal by clicking on the "Past Updates" button in the selected goal         The patient verbalized understanding of instructions provided today and declined a print copy of patient instruction materials.   Telephone follow  up appointment with pharmacy team member scheduled for: Dec 22nd, 2021  [name], PharmD Clinical Pharmacist [place] [facility] [place] [phone]    How to Take Your Blood Pressure You can take your blood pressure at home with a machine. You may need to check your blood pressure at home:  To check if you have high blood pressure (hypertension).  To check your blood  pressure over time.  To make sure your blood pressure medicine is working. Supplies needed: You will need a blood pressure machine, or monitor. You can buy one at a drugstore or online. When choosing one:  Choose one with an arm cuff.  Choose one that wraps around your upper arm. Only one finger should fit between your arm and the cuff.  Do not choose one that measures your blood pressure from your wrist or finger. Your doctor can suggest a monitor. How to prepare Avoid these things for 30 minutes before checking your blood pressure:  Drinking caffeine.  Drinking alcohol.  Eating.  Smoking.  Exercising. Five minutes before checking your blood pressure:  Pee.  Sit in a dining chair. Avoid sitting in a soft couch or armchair.  Be quiet. Do not talk. How to take your blood pressure Follow the instructions that came with your machine. If you have a digital blood pressure monitor, these may be the instructions: 1. Sit up straight. 2. Place your feet on the floor. Do not cross your ankles or legs. 3. Rest your left arm at the level of your heart. You may rest it on a table, desk, or chair. 4. Pull up your shirt sleeve. 5. Wrap the blood pressure cuff around the upper part of your left arm. The cuff should be 1 inch (2.5 cm) above your elbow. It is best to wrap the cuff around bare skin. 6. Fit the cuff snugly around your arm. You should be able to place only one finger between the cuff and your arm. 7. Put the cord inside the groove of your elbow. 8. Press the power button. 9. Sit quietly while the cuff fills with air and loses air. 10. Write down the numbers on the screen. 11. Wait 2-3 minutes and then repeat steps 1-10. What do the numbers mean? Two numbers make up your blood pressure. The first number is called systolic pressure. The second is called diastolic pressure. An example of a blood pressure reading is "120 over 80" (or 120/80). If you are an adult and do not have  a medical condition, use this guide to find out if your blood pressure is normal: Normal  First number: below 120.  Second number: below 80. Elevated  First number: 120-129.  Second number: below 80. Hypertension stage 1  First number: 130-139.  Second number: 80-89. Hypertension stage 2  First number: 140 or above.  Second number: 52 or above. Your blood pressure is above normal even if only the top or bottom number is above normal. Follow these instructions at home:  Check your blood pressure as often as your doctor tells you to.  Take your monitor to your next doctor's appointment. Your doctor will: ? Make sure you are using it correctly. ? Make sure it is working right.  Make sure you understand what your blood pressure numbers should be.  Tell your doctor if your medicines are causing side effects. Contact a doctor if:  Your blood pressure keeps being high. Get help right away if:  Your first blood pressure number is higher  than 180.  Your second blood pressure number is higher than 120. This information is not intended to replace advice given to you by your health care provider. Make sure you discuss any questions you have with your health care provider. Document Revised: 11/17/2017 Document Reviewed: 05/13/2016 Elsevier Patient Education  2020 [employer].

## 2020-09-15 NOTE — Chronic Care Management (AMB) (Signed)
Chronic Care Management Pharmacy  Name: KANANI MOWBRAY  MRN: [id number] DOB: 1935/03/21  Initial Questions: 1. Have you seen any other providers since your last visit? Yes  2. Any changes in your medicines or health? No   Chief Complaint/ HPI  [name],  85 y.o. , female presents for their Follow-Up CCM visit with the clinical pharmacist via telephone due to COVID-19 Pandemic.  PCP : Caren Macadam, MD  Their chronic conditions include: HTN, asthma, GERD, Hypothyroidism, Osteoarthritis, CKD, stage 4, Hypercholesterolemia, Urinary incontinence.   Office Visits: 07/29/20 Micheline Rough, MD - Patient presented for annual exam. BP was elevated in office. TSH was elevated and levothyroxine increased to 150 mcg/day. Recheck TSH on 10/18. Vit D still low, recommended 2000 units daily. LDL elevated. Patient has not been checking BP at home and was not taking levothyroxine on an empty stomach. Follow up in 6 months.  Consult Visit: 04/14/20 Danella Sensing (dermatology): Patient presented for epidermal cyst follow up. Unable to access notes.  02/19/20 Richarda Osmond (ophthalmology): Patient presented for annual exam and IOL check. Patient needs new glasses.  12/31/2019- Discharged from ambulatory surgery (Right carpal tunnel syndrome).   12/25/2019- Orthopedics- Patient presented to Dr. Edmonia Lynch, MD. Patient presented with cortisone injections versus endoscopic carpal tunnel release. Patient preferred to go forward with surgery.   12/17/2019- Nephrology- Patient presented to Dr. Jamal Maes.   09/18/2019- General surgery- Patient presented to Dr. Armandina Gemma for hyperparathyroidism/ hypercalcemia.   Medications: Outpatient Encounter Medications as of 09/15/2020  Medication Sig Note  . amLODipine (NORVASC) 10 MG tablet Take 10 mg by mouth daily.   . DULoxetine (CYMBALTA) 30 MG capsule TAKE ONE CAPSULE BY MOUTH BEFORE BREAKFAST   . esomeprazole (NEXIUM) 40 MG capsule Take 1 capsule  (40 mg total) by mouth daily before breakfast. (Patient taking differently: Take 40 mg by mouth daily before breakfast. Takes 20mg  once daily)   . furosemide (LASIX) 40 MG tablet Patient reports taking three times a week 01/26/2016: Received from: External Pharmacy  . HYDROcodone-acetaminophen (NORCO) 5-325 MG tablet Take 1-2 tablets by mouth every 6 (six) hours as needed for severe pain.   [name] Kitchen levothyroxine (SYNTHROID) 150 MCG tablet Take 1 tablet (150 mcg total) by mouth daily.   . mirabegron ER (MYRBETRIQ) 25 MG TB24 tablet TAKE ONE TABLET BY MOUTH EVERYDAY AT BEDTIME   . montelukast (SINGULAIR) 10 MG tablet TAKE ONE TABLET BY MOUTH BEFORE BREAKFAST   . ondansetron (ZOFRAN) 4 MG tablet Take 1 tablet (4 mg total) by mouth every 8 (eight) hours as needed for nausea or vomiting.   . simvastatin (ZOCOR) 20 MG tablet Take 1 tablet (20 mg total) by mouth daily.    No facility-administered encounter medications on file as of 09/15/2020.     Current Diagnosis/Assessment:  Goals Addressed            This Visit's Progress   . Pharmacy Care Plan       Current Barriers:  . Chronic Disease Management support, education, and care coordination needs related to HTN and HLD, Asthma, GERD, Hypothyroidism, Osteoarthritis, CKD, stage 4, Urinary incontinence   Pharmacist Clinical Goal(s):  [name] Kitchen Maintain Blood pressure <130/80 mmHg  . Prevent worsening of shortness or breath and hospitalizations . Cholesterol goals: Total Cholesterol goal under 200, Triglycerides goal under 150, HDL goal above 40 (men) or above 50 (women), LDL goal under 100.  . Maintain TSH between 0.45 to 4.5uIU/ml . Minimize reflux symptoms  . Maintain  Vitamin D-25 level at or above 30.  . Reduce urinary symptoms    Interventions: . Comprehensive medication review performed. . Collaboration with provider re: medication management . Discussed diet and exercise modifications and effect on blood pressure and cholesterol. . Discussed DASH  diet:  following a diet emphasizing fruits and vegetables and low-fat dairy products along with whole grains, fish, poultry, and nuts. Reducing red meats and sugars.  . Discussed modifying their lifestyle, including initial attempts to lose 5 to 10% of body weight.   Engaging in at least 150 minutes per week of moderate-intensity exercise such as brisk walking (15- to 20-minute mile) or something similar. Recommended they incorporate flexibility, balance, and some type of strength training exercises.    . Discussed non-pharmacological interventions for acid reflux. Take measures to prevent acid reflux, such as avoiding spicy foods, avoiding caffeine, avoid laying down a few hours after eating, and raising the head of the bed.  Patient Self Care Activities:  . Calls provider office for new concerns or questions . Continue taking medications as directed by your providers.  . Make sure you are taking vitamin D3 2,000 units once daily. . Check your blood pressure at home at least twice weekly to make sure your medications are working properly. . Stop taking Levothyroxine 137 mcg and start taking levothyroxine 150 mcg dose.  . Decrease to taking esomeprazole every other day.  Please see past updates related to this goal by clicking on the "Past Updates" button in the selected goal         Asthma    Eosinophil count:   Lab Results  Component Value Date/Time   EOSPCT 2.4 07/29/2020 11:52 AM  %                               Eos (Absolute):  Lab Results  Component Value Date/Time   EOSABS 130 07/29/2020 11:52 AM    Tobacco Status:  Social History   Tobacco Use  Smoking Status Former Smoker  . Packs/day: 0.25  . Years: 6.00  . Pack years: 1.50  . Types: Cigarettes  . Quit date: 12/19/1978  . Years since quitting: 41.7  Smokeless Tobacco Never Used   Patient has failed these meds in past: Qvar, Dulera  Per patient report, patient is currently controlled on the following medications:   - montelukast 10mg , 1 tablet daily - albuterol HFA inhaler (has not needed to use over a year)   Using maintenance inhaler regularly? No Frequency of rescue inhaler use:  never  We discussed:  proper inhaler technique  Plan Continue current medications ,  Hypertension   Office blood pressures are  BP Readings from Last 3 Encounters:  07/29/20 (!) 160/100  01/29/20 128/80  12/31/19 (!) 150/68   Patient has failed these meds in the past: benazepril, Benicar, HCTZ, losartan, olmesartan,   Patient checks BP at home infrequently  Patient home BP readings are ranging: does not remember BP readings.   Patient is controlled on:  - amlodipine 10mg , 1 tablet once daily   We discussed diet and exercise extensively  - DASH diet:  following a diet emphasizing fruits and vegetables and low-fat dairy products along with whole grains, fish, poultry, and nuts. Reducing red meats and sugars.  - Reducing the amount of salt intake to 1500mg /per day.  - Recommend using a salt substitute to replace your salt if you need flavor.    - Weight  reduction- We discussed losing 5-10% of body weight.    Plan Patient will check blood pressure twice weekly for the next month and CPA will follow up in 2.5 weeks.   Hyperlipidemia  LDL goal of < 100  Lipid Panel     Component Value Date/Time   CHOL 218 (H) 07/29/2020 1152   TRIG 160 (H) 07/29/2020 1152   TRIG 138 10/27/2006 0950   HDL 55 07/29/2020 1152   CHOLHDL 4.0 07/29/2020 1152   VLDL 26.6 01/29/2020 1228   LDLCALC 133 (H) 07/29/2020 1152   LDLDIRECT 174.2 07/09/2007 1134     Patient has failed these meds in past: none  Patient is currently controlled on the following medications:  - simvastatin 20mg , 1 tablet at bedtime  We discussed:  diet and exercise extensively  - diet: patient endorses diet is not as good as it should be.   -exercise: we discussed adding in walking (15-20 minutes) a few days per week and patient thinks she will  be able to do this - [place]  Plan Continue current medications.   Consider escalating statin dose for further LDL lowering based on LDL > 100. Will reach out to PCP.  Hypothyroidism   TSH  Date Value Ref Range Status  07/29/2020 13.33 (H) 0.40 - 4.50 mIU/L Final    Patient has failed these meds in past: none  Patient is currently controlled on the following medications: - levothyroxine 137 mcg, 1 tablet before breakfast  Plan Continue current medications. Discussed dose increase recommended by Dr. Ethlyn Gallery. Patient was unaware of dose change and therefore, did not pursue with getting from pharmacy. Plan to start levothyroxine 150 mcg as soon as she gets it and stop taking 137 mcg dose.  GERD  Patient has failed these meds in past:none  Patient is currently controlled on the following medications:  - esomeprazole, 1 capsule daily with breakfast   We discussed:  non-pharmacological interventions for acid reflux. Take measures to prevent acid reflux, such as avoiding spicy foods, avoiding caffeine, avoid laying down a few hours after eating, and raising the head of the bed. - discussed risks of long-term use of PPIs  Plan Patient reports she thinks she needs this medicine and has noticed symptoms return when she misses a dose. Patient agreed to try taking every other day for a few weeks to see if she needs to take it. Continue current medications  Osteoarthritis  Patient has failed these meds in past: Tramadol (nauseous), Tylenol  Patient is currently controlled on the following medications: no medications.   Plan Patient states pain is minimal.  Continue as is.   CKD, stage 4  07/29/2020 Creatinine: 1.59  03/04/2020 Creatinine: 1.40 GFR: 35.9  Plan Decreased kidney function.  Advised patient to maintain adequate fluid intake.   Urinary incontinence  Patient has failed these meds in past: Vesicare (solifenacin), Detrol LA  (tolterodine)  Patient is currently controlled on the following medications: - Myrbetriq 25mg , 1 tablet daily  Plan Continue current medications  Leg swelling   Patient has failed these meds in past: Patient is currently controlled on the following medications: - furosemide 40mg , 1 tablet daily as needed (3x a week).   Plan Continue current medications.   Vitamin D deficiency    Last vitamin D Lab Results  Component Value Date   VD25OH 22 (L) 07/29/2020    Patient is currently controlled on the following medications:  - Vitamin D3 1 tablet daily  Plan Dr. Ethlyn Gallery recommend patient take 2000 units daily and patient was unsure of the strength she is taking as she could not read the bottle but will make sure to include in next adherence packaging from [facility]. Continue current medications  Nerve pain  Patient has failed these meds in past: none  Patient is currently controlled on the following medications:  - duloxetine 30mg , 1 capsule daily   Plan Continue current medications  Allergies   Patient has failed these meds in past: none  Patient is currently controlled on the following medications:  - Xyzal 5mg , 1 tablet daily at bedtime.   Plan Continue current medications.   Medication Management   Pt uses [facility] for all medications Uses pill box? No - uses compliance packaging with [facility]  We discussed: Discussed benefits of medication synchronization, packaging and delivery as well as enhanced pharmacist oversight with [facility].  Plan  Utilize [facility] pharmacy for medication synchronization, packaging and delivery   Follow up: 3 month phone visit Follow up with CPA in 2.5 weeks for levothyroxine management (make sure she is taking 150 mcg and removing 137 mcg tablets), BP, and esomeprazole every other day.  [name], PharmD Clinical Pharmacist [place] [facility]

## 2020-09-16 NOTE — Chronic Care Management (AMB) (Signed)
Reviewed chart for medication changes ahead of medication coordination call.  Medication changes made in PCP office visit. Instructed to increase levothyroxine to 150 mcg daily and vitamin D 2,000 units daily.    BP Readings from Last 3 Encounters:  07/29/20 (!) 160/100  01/29/20 128/80  12/31/19 (!) 150/68    No results found for: HGBA1C   Patient obtains medications through Adherence Packaging  90 Days   Last adherence delivery included:   Duloxetine 30mg : one tab before breakfast  Levothyroxine 135mcg; one tab before breakfast  Amlodipine 10mg ; one tab before breakfast  Furosemide 40mg ; one tab before breakfast Monday, Wednesday, Friday  Montelukast 10mg ; one tab before breakfast  Simvastatin 20mg ; one tab at bedtime  Myrbetriq 25mg ; one tab at bedtime  Patient is due for next adherence delivery on: 10/23/2020. Called patient and reviewed medications and coordinated delivery.  This delivery to include: Levothyroxine 150 mcg daily  Coordinated acute fill for levothyroxine 150 mcg to be delivered 09/16/2020.  Plan to switch to 30 day packaging for next shipment in order to get TSH and levothyroxine dose straightened out.   Patient declined the following medications:  Duloxetine 30mg : one tab before breakfast  Amlodipine 10mg ; one tab before breakfast  Furosemide 40mg ; one tab before breakfast Monday, Wednesday, Friday  Montelukast 10mg ; one tab before breakfast  Simvastatin 20mg ; one tab at bedtime  Myrbetriq 25mg ; one tab at bedtime  Patient needs refills for no medication at this time.  Confirmed delivery date of 09/16/20, advised patient that pharmacy will contact them the morning of delivery.

## 2020-10-02 ENCOUNTER — Telehealth: Payer: Self-pay | Admitting: Pharmacist

## 2020-10-02 DIAGNOSIS — E039 Hypothyroidism, unspecified: Secondary | ICD-10-CM

## 2020-10-02 NOTE — Chronic Care Management (AMB) (Signed)
Called patient to reschedule lab appointment originally scheduled for 10/18 for TSH recheck. Patient was still confused about her dose of levothyroxine and was instructed to take 150 mcg dose that was delivered by [facility]. Patient confirmed the plan and lab appointment rescheduled for 12/8 @ 11 am.

## 2020-10-05 ENCOUNTER — Other Ambulatory Visit: Payer: PPO

## 2020-10-09 ENCOUNTER — Ambulatory Visit: Payer: PPO | Attending: Internal Medicine

## 2020-10-09 DIAGNOSIS — Z23 Encounter for immunization: Secondary | ICD-10-CM

## 2020-10-09 NOTE — Progress Notes (Signed)
   Covid-19 Vaccination Clinic  Name:  [name]    MRN: [id number] DOB: July 02, 1935  10/09/2020  [name] was observed post Covid-19 immunization for 15 minutes without incident. She was provided with Vaccine Information Sheet and instruction to access the V-Safe system.   [name] was instructed to call 911 with any severe reactions post vaccine: [name] Kitchen Difficulty breathing  . Swelling of face and throat  . A fast heartbeat  . A bad rash all over body  . Dizziness and weakness

## 2020-10-16 ENCOUNTER — Telehealth: Payer: Self-pay | Admitting: Pharmacist

## 2020-10-16 DIAGNOSIS — E039 Hypothyroidism, unspecified: Secondary | ICD-10-CM

## 2020-10-16 DIAGNOSIS — I10 Essential (primary) hypertension: Secondary | ICD-10-CM

## 2020-10-16 NOTE — Chronic Care Management (AMB) (Addendum)
Chronic Care Management Pharmacy Assistant   Name: LYSA LIVENGOOD  MRN: [id number] DOB: Jan 29, 1935  Reason for Encounter: Medication Review  Patient Questions:  1.  Have you seen any other providers since your last visit? No  2.  Any changes in your medicines or health? No   PCP : Caren Macadam, MD  Allergies:  No Known Allergies  Medications: Outpatient Encounter Medications as of 10/16/2020  Medication Sig Note  . amLODipine (NORVASC) 10 MG tablet Take 10 mg by mouth daily.   . DULoxetine (CYMBALTA) 30 MG capsule TAKE ONE CAPSULE BY MOUTH BEFORE BREAKFAST   . esomeprazole (NEXIUM) 40 MG capsule Take 1 capsule (40 mg total) by mouth daily before breakfast. (Patient taking differently: Take 40 mg by mouth daily before breakfast. Takes 20mg  once daily)   . furosemide (LASIX) 40 MG tablet Patient reports taking three times a week 01/26/2016: Received from: External Pharmacy  . HYDROcodone-acetaminophen (NORCO) 5-325 MG tablet Take 1-2 tablets by mouth every 6 (six) hours as needed for severe pain.   [name] Kitchen levothyroxine (SYNTHROID) 150 MCG tablet Take 1 tablet (150 mcg total) by mouth daily.   . mirabegron ER (MYRBETRIQ) 25 MG TB24 tablet TAKE ONE TABLET BY MOUTH EVERYDAY AT BEDTIME   . montelukast (SINGULAIR) 10 MG tablet TAKE ONE TABLET BY MOUTH BEFORE BREAKFAST   . ondansetron (ZOFRAN) 4 MG tablet Take 1 tablet (4 mg total) by mouth every 8 (eight) hours as needed for nausea or vomiting.   . simvastatin (ZOCOR) 20 MG tablet Take 1 tablet (20 mg total) by mouth daily.    No facility-administered encounter medications on file as of 10/16/2020.    Current Diagnosis: Patient Active Problem List   Diagnosis Date Noted  . Sinusitis chronic, frontal 11/06/2015  . Cough variant asthma 09/23/2015  . Chronic cough 04/09/2015  . Acute renal failure superimposed on stage 4 chronic kidney disease ([place]) 04/09/2015  . Obesity 11/22/2011  . History of colonic polyps 11/06/2009  .  NEPHROLITHIASIS, HX OF 08/07/2008  . Osteoarthritis 05/07/2008  . Disorder resulting from impaired renal function 05/06/2008  . DOE (dyspnea on exertion) 05/06/2008  . HYPERCHOLESTEROLEMIA, PURE 07/09/2007  . Hypothyroidism 06/18/2007  . Essential hypertension 06/18/2007  . Asthma 06/18/2007  . GERD 06/18/2007    Goals Addressed   None    Reviewed chart for medication changes ahead of medication coordination call.  No OVs, Consults, or hospital visits since last care coordination call/Pharmacist visit. No medication changes indicated.  BP Readings from Last 3 Encounters:  07/29/20 (!) 160/100  01/29/20 128/80  12/31/19 (!) 150/68    No results found for: HGBA1C   Patient obtains medications through Adherence Packaging  30 Days   Last adherence delivery included: [name] Kitchen Duloxetine 30 mg: one tablet before breakfast . Levothyroxine 137 mcg; one tablet before breakfast . Amlodipine 10 mg; one tablet before breakfast . Furosemide 40 mg; one tablet before breakfast Monday, Wednesday, Friday . Montelukast 10 mg; one tablet before breakfast . Simvastatin 20 mg; one tablet at bedtime . Myrbetriq 25 mg; one tablet at bedtime  Patient is due for next adherence delivery on: 10-22-2020. Called patient and reviewed medications and coordinated delivery.  This delivery to include: [name] Kitchen Duloxetine 30 mg: one tablet before breakfast . Levothyroxine 150 mcg; one tablet before breakfast . Amlodipine 10 mg; one tablet before breakfast . Furosemide 40 mg; one tablet before breakfast Monday, Wednesday, Friday . Montelukast 10 mg; one tablet before breakfast . Simvastatin 20  mg; one tablet at bedtime . Myrbetriq 25 mg; one tablet at bedtime  She does not need refills at this time.  Confirmed delivery date of 10-22-2020, advised patient that pharmacy will contact them the morning of delivery.  Follow-Up:  Coordination of Enhanced Pharmacy Services   Amilia (Oswaldo Milian) Mare Ferrari, [place] [phone]  Plan to switch to 30 day adherence packaging while thyroid medication is being adjusted and until TSH is back in range.   [name], PharmD Clinical Pharmacist [place] [facility] [place]

## 2020-11-11 ENCOUNTER — Telehealth: Payer: Self-pay | Admitting: Pharmacist

## 2020-11-11 NOTE — Chronic Care Management (AMB) (Signed)
Chronic Care Management Pharmacy Assistant   Name: [name]  MRN: [id number] DOB: [birth year]  Reason for Encounter: Medication Review  Patient Questions:  1.  Have you seen any other providers since your last visit? No  2.  Any changes in your medicines or health? No   PCP : Caren Macadam, MD  Allergies:  No Known Allergies  Medications: Outpatient Encounter Medications as of 11/11/2020  Medication Sig Note  . amLODipine (NORVASC) 10 MG tablet Take 10 mg by mouth daily.   . DULoxetine (CYMBALTA) 30 MG capsule TAKE ONE CAPSULE BY MOUTH BEFORE BREAKFAST   . esomeprazole (NEXIUM) 40 MG capsule Take 1 capsule (40 mg total) by mouth daily before breakfast. (Patient taking differently: Take 40 mg by mouth daily before breakfast. Takes 20mg  once daily)   . furosemide (LASIX) 40 MG tablet Patient reports taking three times a week 01/26/2016: Received from: External Pharmacy  . HYDROcodone-acetaminophen (NORCO) 5-325 MG tablet Take 1-2 tablets by mouth every 6 (six) hours as needed for severe pain.   [name] Kitchen levothyroxine (SYNTHROID) 150 MCG tablet Take 1 tablet (150 mcg total) by mouth daily.   . mirabegron ER (MYRBETRIQ) 25 MG TB24 tablet TAKE ONE TABLET BY MOUTH EVERYDAY AT BEDTIME   . montelukast (SINGULAIR) 10 MG tablet TAKE ONE TABLET BY MOUTH BEFORE BREAKFAST   . ondansetron (ZOFRAN) 4 MG tablet Take 1 tablet (4 mg total) by mouth every 8 (eight) hours as needed for nausea or vomiting.   . simvastatin (ZOCOR) 20 MG tablet Take 1 tablet (20 mg total) by mouth daily.    No facility-administered encounter medications on file as of 11/11/2020.    Current Diagnosis: Patient Active Problem List   Diagnosis Date Noted  . Sinusitis chronic, frontal 11/06/2015  . Cough variant asthma 09/23/2015  . Chronic cough 04/09/2015  . Acute renal failure superimposed on stage 4 chronic kidney disease ([place]) 04/09/2015  . Obesity 11/22/2011  . History of colonic polyps 11/06/2009  .  NEPHROLITHIASIS, HX OF 08/07/2008  . Osteoarthritis 05/07/2008  . Disorder resulting from impaired renal function 05/06/2008  . DOE (dyspnea on exertion) 05/06/2008  . HYPERCHOLESTEROLEMIA, PURE 07/09/2007  . Hypothyroidism 06/18/2007  . Essential hypertension 06/18/2007  . Asthma 06/18/2007  . GERD 06/18/2007    Goals Addressed   None    Reviewed chart for medication changes ahead of medication coordination call. No OVs, Consults, or hospital visits since last care coordination call/Pharmacist visit.  No medication changes indicated OR if recent visit, treatment plan here.  BP Readings from Last 3 Encounters:  07/29/20 (!) 160/100  01/29/20 128/80  12/31/19 (!) 150/68    No results found for: HGBA1C   Patient obtains medications through Adherence Packaging  30 Days   Last adherence delivery included: [name] Kitchen Duloxetine 30 mg: one tablet before breakfast . Levothyroxine 150 mcg; one tablet before breakfast . Amlodipine 10 mg; one tablet before breakfast . Furosemide 40 mg; one tablet before breakfast Monday, Wednesday, Friday . Montelukast 10 mg; one tablet before breakfast . Simvastatin 20 mg; one tablet at bedtime . Myrbetriq 25 mg; one tablet at bedtime  Patient is due for next adherence delivery on: 11-18-2020. Called patient and reviewed medications and coordinated delivery.  This delivery to include: [name] Kitchen Duloxetine 30 mg: one tablet before breakfast . Levothyroxine 150 mcg; one tablet before breakfast . Amlodipine 10 mg; one tablet before breakfast . Furosemide 40 mg; one tablet before breakfast Monday, Wednesday, Friday . Montelukast 10 mg;  one tablet before breakfast . Simvastatin 20 mg; one tablet at bedtime . Myrbetriq 25 mg; one tablet at bedtime  Patient needs refills for Amlodipine 10 mg.  Confirmed delivery date of 11-18-2020, advised patient that pharmacy will contact them the morning of delivery.  Follow-Up:  Coordination of Enhanced Pharmacy Services    Amilia ([place]) Mare Ferrari, [place] Assistant [phone]

## 2020-11-18 ENCOUNTER — Other Ambulatory Visit: Payer: Self-pay

## 2020-11-18 ENCOUNTER — Ambulatory Visit (INDEPENDENT_AMBULATORY_CARE_PROVIDER_SITE_OTHER): Payer: PPO

## 2020-11-18 VITALS — BP 132/76 | HR 77 | Temp 97.6°F | Ht 65.0 in | Wt 211.4 lb

## 2020-11-18 DIAGNOSIS — Z23 Encounter for immunization: Secondary | ICD-10-CM

## 2020-11-18 DIAGNOSIS — Z0000 Encounter for general adult medical examination without abnormal findings: Secondary | ICD-10-CM | POA: Diagnosis not present

## 2020-11-18 NOTE — Patient Instructions (Signed)
[name] , Thank you for taking time to come for your Medicare Wellness Visit. I appreciate your ongoing commitment to your health goals. Please review the following plan we discussed and let me know if I can assist you in the future.   Screening recommendations/referrals: Colonoscopy: No longer required Mammogram: Up to date, next due 09/02/2020 Bone Density: No longer required  Recommended yearly ophthalmology/optometry visit for glaucoma screening and checkup Recommended yearly dental visit for hygiene and checkup  Vaccinations: Influenza vaccine: Up to date,next due fall 2022  Pneumococcal vaccine: Completed series Tdap vaccine: Up to date, next due 11/21/2021 Shingles vaccine: Completed series     Advanced directives: Please bring in copies of your advanced medical directives into our office   Conditions/risks identified: None   Next appointment: 02/02/2020 @ 10:00 am with Dr. Ethlyn Gallery   Preventive Care 65 Years and Older, Female Preventive care refers to lifestyle choices and visits with your health care provider that can promote health and wellness. What does preventive care include?  A yearly physical exam. This is also called an annual well check.  Dental exams once or twice a year.  Routine eye exams. Ask your health care provider how often you should have your eyes checked.  Personal lifestyle choices, including:  Daily care of your teeth and gums.  Regular physical activity.  Eating a healthy diet.  Avoiding tobacco and drug use.  Limiting alcohol use.  Practicing safe sex.  Taking low-dose aspirin every day.  Taking vitamin and mineral supplements as recommended by your health care provider. What happens during an annual well check? The services and screenings done by your health care provider during your annual well check will depend on your age, overall health, lifestyle risk factors, and family history of disease. Counseling  Your health care  provider may ask you questions about your:  Alcohol use.  Tobacco use.  Drug use.  Emotional well-being.  Home and relationship well-being.  Sexual activity.  Eating habits.  History of falls.  Memory and ability to understand (cognition).  Work and work Statistician.  Reproductive health. Screening  You may have the following tests or measurements:  Height, weight, and BMI.  Blood pressure.  Lipid and cholesterol levels. These may be checked every 5 years, or more frequently if you are over 9 years old.  Skin check.  Lung cancer screening. You may have this screening every year starting at age 73 if you have a 30-pack-year history of smoking and currently smoke or have quit within the past 15 years.  Fecal occult blood test (FOBT) of the stool. You may have this test every year starting at age 78.  Flexible sigmoidoscopy or colonoscopy. You may have a sigmoidoscopy every 5 years or a colonoscopy every 10 years starting at age 35.  Hepatitis C blood test.  Hepatitis B blood test.  Sexually transmitted disease (STD) testing.  Diabetes screening. This is done by checking your blood sugar (glucose) after you have not eaten for a while (fasting). You may have this done every 1-3 years.  Bone density scan. This is done to screen for osteoporosis. You may have this done starting at age 27.  Mammogram. This may be done every 1-2 years. Talk to your health care provider about how often you should have regular mammograms. Talk with your health care provider about your test results, treatment options, and if necessary, the need for more tests. Vaccines  Your health care provider may recommend certain vaccines, such as:  Influenza vaccine. This is recommended every year.  Tetanus, diphtheria, and acellular pertussis (Tdap, Td) vaccine. You may need a Td booster every 10 years.  Zoster vaccine. You may need this after age 44.  Pneumococcal 13-valent conjugate (PCV13)  vaccine. One dose is recommended after age 64.  Pneumococcal polysaccharide (PPSV23) vaccine. One dose is recommended after age 59. Talk to your health care provider about which screenings and vaccines you need and how often you need them. This information is not intended to replace advice given to you by your health care provider. Make sure you discuss any questions you have with your health care provider. Document Released: 01/01/2016 Document Revised: 08/24/2016 Document Reviewed: 10/06/2015 Elsevier Interactive Patient Education  2017 [place] Prevention in the Home Falls can cause injuries. They can happen to people of all ages. There are many things you can do to make your home safe and to help prevent falls. What can I do on the outside of my home?  Regularly fix the edges of walkways and driveways and fix any cracks.  Remove anything that might make you trip as you walk through a door, such as a raised step or threshold.  Trim any bushes or trees on the path to your home.  Use bright outdoor lighting.  Clear any walking paths of anything that might make someone trip, such as rocks or tools.  Regularly check to see if handrails are loose or broken. Make sure that both sides of any steps have handrails.  Any raised decks and porches should have guardrails on the edges.  Have any leaves, snow, or ice cleared regularly.  Use sand or salt on walking paths during winter.  Clean up any spills in your garage right away. This includes oil or grease spills. What can I do in the bathroom?  Use night lights.  Install grab bars by the toilet and in the tub and shower. Do not use towel bars as grab bars.  Use non-skid mats or decals in the tub or shower.  If you need to sit down in the shower, use a plastic, non-slip stool.  Keep the floor dry. Clean up any water that spills on the floor as soon as it happens.  Remove soap buildup in the tub or shower  regularly.  Attach bath mats securely with double-sided non-slip rug tape.  Do not have throw rugs and other things on the floor that can make you trip. What can I do in the bedroom?  Use night lights.  Make sure that you have a light by your bed that is easy to reach.  Do not use any sheets or blankets that are too big for your bed. They should not hang down onto the floor.  Have a firm chair that has side arms. You can use this for support while you get dressed.  Do not have throw rugs and other things on the floor that can make you trip. What can I do in the kitchen?  Clean up any spills right away.  Avoid walking on wet floors.  Keep items that you use a lot in easy-to-reach places.  If you need to reach something above you, use a strong step stool that has a grab bar.  Keep electrical cords out of the way.  Do not use floor polish or wax that makes floors slippery. If you must use wax, use non-skid floor wax.  Do not have throw rugs and other things on the floor that  can make you trip. What can I do with my stairs?  Do not leave any items on the stairs.  Make sure that there are handrails on both sides of the stairs and use them. Fix handrails that are broken or loose. Make sure that handrails are as long as the stairways.  Check any carpeting to make sure that it is firmly attached to the stairs. Fix any carpet that is loose or worn.  Avoid having throw rugs at the top or bottom of the stairs. If you do have throw rugs, attach them to the floor with carpet tape.  Make sure that you have a light switch at the top of the stairs and the bottom of the stairs. If you do not have them, ask someone to add them for you. What else can I do to help prevent falls?  Wear shoes that:  Do not have high heels.  Have rubber bottoms.  Are comfortable and fit you well.  Are closed at the toe. Do not wear sandals.  If you use a stepladder:  Make sure that it is fully  opened. Do not climb a closed stepladder.  Make sure that both sides of the stepladder are locked into place.  Ask someone to hold it for you, if possible.  Clearly mark and make sure that you can see:  Any grab bars or handrails.  First and last steps.  Where the edge of each step is.  Use tools that help you move around (mobility aids) if they are needed. These include:  Canes.  Walkers.  Scooters.  Crutches.  Turn on the lights when you go into a dark area. Replace any light bulbs as soon as they burn out.  Set up your furniture so you have a clear path. Avoid moving your furniture around.  If any of your floors are uneven, fix them.  If there are any pets around you, be aware of where they are.  Review your medicines with your doctor. Some medicines can make you feel dizzy. This can increase your chance of falling. Ask your doctor what other things that you can do to help prevent falls. This information is not intended to replace advice given to you by your health care provider. Make sure you discuss any questions you have with your health care provider. Document Released: 10/01/2009 Document Revised: 05/12/2016 Document Reviewed: 01/09/2015 Elsevier Interactive Patient Education  2017 [employer].

## 2020-11-18 NOTE — Progress Notes (Signed)
Subjective:   [name] is a 85 y.o. female who presents for Medicare Annual (Subsequent) preventive examination.  Review of Systems    N/A  Cardiac Risk Factors include: advanced age (>46men, >50 women);dyslipidemia;hypertension     Objective:    Today's Vitals   11/18/20 1000  BP: 132/76  Pulse: 77  Temp: 97.6 F (36.4 C)  TempSrc: Oral  SpO2: 93%  Weight: 211 lb 7 oz (95.9 kg)  Height: $Remove'5\' 5"'uxyJPOX$  (1.651 m)   Body mass index is 35.19 kg/m.  Advanced Directives 11/18/2020 12/31/2019 11/06/2015 10/29/2015 05/14/2015 04/09/2015  Does Patient Have a Medical Advance Directive? Yes Yes Yes Yes Yes No  Type of Paramedic of [place];Living will [place];Living will - - Living will;Out of facility DNR (pink MOST or yellow form) -  Does patient want to make changes to medical advance directive? No - Patient declined - - - - -  Copy of [place] in Chart? No - copy requested No - copy requested - No - copy requested - -    Current Medications (verified) Outpatient Encounter Medications as of 11/18/2020  Medication Sig  . amLODipine (NORVASC) 10 MG tablet Take 10 mg by mouth daily.  . DULoxetine (CYMBALTA) 30 MG capsule TAKE ONE CAPSULE BY MOUTH BEFORE BREAKFAST  . esomeprazole (NEXIUM) 40 MG capsule Take 1 capsule (40 mg total) by mouth daily before breakfast. (Patient taking differently: Take 40 mg by mouth daily before breakfast. Takes $RemoveBeforeDE'20mg'GJwzlmPBcPacJBO$  once daily)  . levothyroxine (SYNTHROID) 150 MCG tablet Take 1 tablet (150 mcg total) by mouth daily.  . mirabegron ER (MYRBETRIQ) 25 MG TB24 tablet TAKE ONE TABLET BY MOUTH EVERYDAY AT BEDTIME  . simvastatin (ZOCOR) 20 MG tablet Take 1 tablet (20 mg total) by mouth daily.  . furosemide (LASIX) 40 MG tablet Patient reports taking three times a week (Patient not taking: Reported on 11/18/2020)  . HYDROcodone-acetaminophen (NORCO) 5-325 MG tablet Take 1-2 tablets by mouth every 6 (six) hours  as needed for severe pain. (Patient not taking: Reported on 11/18/2020)  . montelukast (SINGULAIR) 10 MG tablet TAKE ONE TABLET BY MOUTH BEFORE BREAKFAST (Patient not taking: Reported on 11/18/2020)  . ondansetron (ZOFRAN) 4 MG tablet Take 1 tablet (4 mg total) by mouth every 8 (eight) hours as needed for nausea or vomiting. (Patient not taking: Reported on 11/18/2020)   No facility-administered encounter medications on file as of 11/18/2020.    Allergies (verified) Patient has no known allergies.   History: Past Medical History:  Diagnosis Date  . Asthma    no problem in last year  . COLONIC POLYPS, HX OF 11/06/2009  . Cough    nonproductive   . DYSPNEA ON EXERTION 05/06/2008  . GERD 06/18/2007  . Goiter    hx of 1973 removed  . HYPERCHOLESTEROLEMIA, PURE 07/09/2007  . HYPERTENSION 06/18/2007  . HYPOTHYROIDISM 06/18/2007  . Insomnia   . NEPHROLITHIASIS, HX OF 08/07/2008  . Obesity   . OSTEOARTHRITIS 05/07/2008  . Renal cyst   . RENAL INSUFFICIENCY 05/06/2008   Past Surgical History:  Procedure Laterality Date  . ABDOMINAL HYSTERECTOMY     partial  . CARPAL TUNNEL RELEASE Right 12/31/2019   Procedure: CARPAL TUNNEL RELEASE ENDOSCOPIC;  Surgeon: Renette Butters, MD;  [facility];  Service: Orthopedics;  Laterality: Right;  . CATARACT EXTRACTION Bilateral   . JOINT REPLACEMENT     bilateral knees  . KNEE ARTHROSCOPY Left   .  LITHOTRIPSY    . NASAL SINUS SURGERY  1990  . SINUS ENDO W/FUSION Right 11/06/2015   Procedure: ENDOSCOPIC SINUS SURGERY WITH NAVIGATION;  Surgeon: Melissa Montane, MD;  [facility];  Service: ENT;  Laterality: Right;  Endoscopic sinus surgery with Fusion protocol, right frontal and ethmoid sinusotomy   . TONSILLECTOMY    . TOTAL KNEE ARTHROPLASTY     Family History  Problem Relation Age of Onset  . Heart Problems Father   . Multiple myeloma Other   . Colon cancer Other   . Hypertension Neg Hx        family  . Cancer Neg Hx         colon ca , prostate ca   Social History   Socioeconomic History  . Marital status: Widowed    Spouse name: Not on file  . Number of children: Not on file  . Years of education: Not on file  . Highest education level: Not on file  Occupational History  . Occupation: Retired  Tobacco Use  . Smoking status: Former Smoker    Packs/day: 0.25    Years: 6.00    Pack years: 1.50    Types: Cigarettes    Quit date: 12/19/1978    Years since quitting: 41.9  . Smokeless tobacco: Never Used  Vaping Use  . Vaping Use: Never used  Substance and Sexual Activity  . Alcohol use: Yes    Alcohol/week: 0.0 standard drinks    Comment: rare  . Drug use: No  . Sexual activity: Not on file  Other Topics Concern  . Not on file  Social History Narrative  . Not on file   Social Determinants of Health   Financial Resource Strain: Low Risk   . Difficulty of Paying Living Expenses: Not hard at all  Food Insecurity: No Food Insecurity  . Worried About Charity fundraiser in the Last Year: Never true  . Ran Out of Food in the Last Year: Never true  Transportation Needs: No Transportation Needs  . Lack of Transportation (Medical): No  . Lack of Transportation (Non-Medical): No  Physical Activity: Inactive  . Days of Exercise per Week: 0 days  . Minutes of Exercise per Session: 0 min  Stress: No Stress Concern Present  . Feeling of Stress : Not at all  Social Connections: Moderately Isolated  . Frequency of Communication with Friends and Family: More than three times a week  . Frequency of Social Gatherings with Friends and Family: More than three times a week  . Attends Religious Services: Never  . Active Member of Clubs or Organizations: Yes  . Attends Archivist Meetings: 1 to 4 times per year  . Marital Status: Widowed    Tobacco Counseling Counseling given: Not Answered   Clinical Intake:  Pre-visit preparation completed: Yes  Pain : No/denies pain     Nutritional  Status: BMI > 30  Obese Nutritional Risks: None Diabetes: No  How often do you need to have someone help you when you read instructions, pamphlets, or other written materials from your doctor or pharmacy?: 1 - Never What is the last grade level you completed in school?: College  Diabetic?No   Interpreter Needed?: No  Information entered by :: [place] of Daily Living In your present state of health, do you have any difficulty performing the following activities: 11/18/2020 12/31/2019  Hearing? N N  Vision? N N  Difficulty concentrating or making decisions? N N  Walking or climbing stairs? N N  Dressing or bathing? N N  Doing errands, shopping? N -  Preparing Food and eating ? N -  Using the Toilet? N -  In the past six months, have you accidently leaked urine? Y -  Comment Occassional bladder leakage, wears a pad -  Do you have problems with loss of bowel control? N -  Managing your Medications? N -  Managing your Finances? N -  Housekeeping or managing your Housekeeping? N -  Some recent data might be hidden    Patient Care Team: Caren Macadam, MD as PCP - General (Family Medicine) Jamal Maes, MD as Consulting Physician (Nephrology) Viona Gilmore, [facility] as Pharmacist (Pharmacist)  Indicate any recent Medical Services you may have received from other than [facility] providers in the past year (date may be approximate).     Assessment:   This is a routine wellness examination for [name].  Hearing/Vision screen  Hearing Screening   '125Hz'$  $Remo'250Hz'GcBkb$'500Hz'$'1000Hz'$'2000Hz'$'3000Hz'$'4000Hz'$'6000Hz'$'8000Hz'$   Right ear:           Left ear:           Vision Screening Comments: Patient goes to the eye doctor every other year   Dietary issues and exercise activities discussed: Current Exercise Habits: The patient does not participate in regular exercise at present, Exercise limited by: respiratory conditions(s)  Goals    . Patient Stated     I would like to get a dog  so I can walk with it.    [name] Kitchen Pharmacy Care Plan     Current Barriers:  . Chronic Disease Management support, education, and care coordination needs related to HTN and HLD, Asthma, GERD, Hypothyroidism, Osteoarthritis, CKD, stage 4, Urinary incontinence   Pharmacist Clinical Goal(s):  [name] Kitchen Maintain Blood pressure <130/80 mmHg  . Prevent worsening of shortness or breath and hospitalizations . Cholesterol goals: Total Cholesterol goal under 200, Triglycerides goal under 150, HDL goal above 40 (men) or above 50 (women), LDL goal under 100.  . Maintain TSH between 0.45 to 4.5uIU/ml . Minimize reflux symptoms  . Maintain Vitamin D-25 level at or above 30.  . Reduce urinary symptoms    Interventions: . Comprehensive medication review performed. . Collaboration with provider re: medication management . Discussed diet and exercise modifications and effect on blood pressure and cholesterol. . Discussed DASH diet:  following a diet emphasizing fruits and vegetables and low-fat dairy products along with whole grains, fish, poultry, and nuts. Reducing red meats and sugars.  . Discussed modifying their lifestyle, including initial attempts to lose 5 to 10% of body weight.   Engaging in at least 150 minutes per week of moderate-intensity exercise such as brisk walking (15- to 20-minute mile) or something similar. Recommended they incorporate flexibility, balance, and some type of strength training exercises.    . Discussed non-pharmacological interventions for acid reflux. Take measures to prevent acid reflux, such as avoiding spicy foods, avoiding caffeine, avoid laying down a few hours after eating, and raising the head of the bed.  Patient Self Care Activities:  . Calls provider office for new concerns or questions . Continue taking medications as directed by your providers.  . Make sure you are taking vitamin D3 2,000 units once daily. . Check your blood pressure at home at least twice weekly to make  sure your medications are working properly. . Stop taking Levothyroxine 137 mcg and start taking levothyroxine 150 mcg dose.  [name] Kitchen  Decrease to taking esomeprazole every other day.  Please see past updates related to this goal by clicking on the "Past Updates" button in the selected goal        Depression Screen PHQ 2/9 Scores 11/18/2020 07/29/2020 02/01/2018 12/16/2016 01/26/2016 01/01/2015 01/01/2014  PHQ - 2 Score 0 0 0 0 0 0 0  PHQ- 9 Score 0 - - - - - -    Fall Risk Fall Risk  11/18/2020 07/29/2020 02/01/2018 12/16/2016 01/26/2016  Falls in the past year? 0 0 No No Yes  Number falls in past yr: 0 0 - - 1  Injury with Fall? 0 0 - - Yes  Risk for fall due to : No Fall Risks - - - Other (Comment)  Risk for fall due to: Comment - - - - fell down stairs missed a step  Follow up Falls evaluation completed;Falls prevention discussed - - - -    Any stairs in or around the home? Yes  If so, are there any without handrails? No  Home free of loose throw rugs in walkways, pet beds, electrical cords, etc? Yes  Adequate lighting in your home to reduce risk of falls? Yes   ASSISTIVE DEVICES UTILIZED TO PREVENT FALLS:  Life alert? Yes  Use of a cane, walker or w/c? No  Grab bars in the bathroom? Yes  Shower chair or bench in shower? No  Elevated toilet seat or a handicapped toilet? Yes   TIMED UP AND GO:  Was the test performed? Yes .  Length of time to ambulate 10 feet: 8 sec.   Gait slow and steady without use of assistive device  Cognitive Function:  Cognitive screening not indicated based on direct observation.      Immunizations Immunization History  Administered Date(s) Administered  . Fluad Quad(high Dose 65+) 09/26/2019  . Influenza Split 09/20/2011, 09/12/2012  . Influenza Whole 10/19/2006, 10/18/2007, 09/19/2008, 10/02/2010  . Influenza, High Dose Seasonal PF 08/21/2015, 09/08/2016, 09/05/2017, 10/12/2018  . Influenza,inj,Quad PF,6+ Mos 10/04/2013, 09/12/2014  .  Influenza-Unspecified 08/20/2019  . PFIZER SARS-COV-2 Vaccination 01/07/2020, 01/27/2020, 10/09/2020  . Pneumococcal Conjugate-13 07/01/2014  . Pneumococcal Polysaccharide-23 09/18/2004  . Tdap 11/22/2011  . Zoster 05/06/2008  . Zoster Recombinat (Shingrix) 09/26/2019    TDAP status: Up to date Flu Vaccine status: Completed at today's visit Pneumococcal vaccine status: Up to date Covid-19 vaccine status: Completed vaccines  Qualifies for Shingles Vaccine? Yes   Zostavax completed Yes   Shingrix Completed?: Yes  Screening Tests Health Maintenance  Topic Date Due  . INFLUENZA VACCINE  07/19/2020  . MAMMOGRAM  09/02/2021  . TETANUS/TDAP  11/21/2021  . DEXA SCAN  Completed  . COVID-19 Vaccine  Completed  . PNA vac Low Risk Adult  Completed    Health Maintenance  Health Maintenance Due  Topic Date Due  . INFLUENZA VACCINE  07/19/2020    Colorectal cancer screening: No longer required.  Mammogram status: Completed 09/02/2020. Repeat every year Bone Density status: Completed 07/02/2014. Results reflect: Bone density results: NORMAL. Repeat every 0 years.  Lung Cancer Screening: (Low Dose CT Chest recommended if Age 78-80 years, 30 pack-year currently smoking OR have quit w/in 15years.) does not qualify.   Lung Cancer Screening Referral: N/A   Additional Screening:  Hepatitis C Screening: does not qualify;   Vision Screening: Recommended annual ophthalmology exams for early detection of glaucoma and other disorders of the eye. Is the patient up to date with their annual eye exam?  No  Who is  the provider or what is the name of the office in which the patient attends annual eye exams? Patient can not remember eye doctors name or office  If pt is not established with a provider, would they like to be referred to a provider to establish care? No .   Dental Screening: Recommended annual dental exams for proper oral hygiene  Community Resource Referral / Chronic Care  Management: CRR required this visit?  No   CCM required this visit?  No      Plan:     I have personally reviewed and noted the following in the patient's chart:   . Medical and social history . Use of alcohol, tobacco or illicit drugs  . Current medications and supplements . Functional ability and status . Nutritional status . Physical activity . Advanced directives . List of other physicians . Hospitalizations, surgeries, and ER visits in previous 12 months . Vitals . Screenings to include cognitive, depression, and falls . Referrals and appointments  In addition, I have reviewed and discussed with patient certain preventive protocols, quality metrics, and best practice recommendations. A written personalized care plan for preventive services as well as general preventive health recommendations were provided to patient.     Sherri Neas, LPN   17/03/9448   Nurse Notes: None

## 2020-11-25 ENCOUNTER — Other Ambulatory Visit: Payer: PPO

## 2020-11-25 ENCOUNTER — Other Ambulatory Visit: Payer: Self-pay

## 2020-11-25 DIAGNOSIS — E039 Hypothyroidism, unspecified: Secondary | ICD-10-CM

## 2020-11-25 DIAGNOSIS — I10 Essential (primary) hypertension: Secondary | ICD-10-CM | POA: Diagnosis not present

## 2020-11-26 ENCOUNTER — Other Ambulatory Visit: Payer: Self-pay | Admitting: Family Medicine

## 2020-11-26 DIAGNOSIS — E213 Hyperparathyroidism, unspecified: Secondary | ICD-10-CM | POA: Diagnosis not present

## 2020-11-26 DIAGNOSIS — I129 Hypertensive chronic kidney disease with stage 1 through stage 4 chronic kidney disease, or unspecified chronic kidney disease: Secondary | ICD-10-CM | POA: Diagnosis not present

## 2020-11-26 DIAGNOSIS — N1832 Chronic kidney disease, stage 3b: Secondary | ICD-10-CM | POA: Diagnosis not present

## 2020-11-26 LAB — COMPREHENSIVE METABOLIC PANEL
AG Ratio: 1.7 (calc) (ref 1.0–2.5)
ALT: 6 U/L (ref 6–29)
AST: 12 U/L (ref 10–35)
Albumin: 3.8 g/dL (ref 3.6–5.1)
Alkaline phosphatase (APISO): 128 U/L (ref 37–153)
BUN/Creatinine Ratio: 12 (calc) (ref 6–22)
BUN: 17 mg/dL (ref 7–25)
CO2: 29 mmol/L (ref 20–32)
Calcium: 11.1 mg/dL — ABNORMAL HIGH (ref 8.6–10.4)
Chloride: 104 mmol/L (ref 98–110)
Creat: 1.42 mg/dL — ABNORMAL HIGH (ref 0.60–0.88)
Globulin: 2.3 g/dL (calc) (ref 1.9–3.7)
Glucose, Bld: 92 mg/dL (ref 65–99)
Potassium: 4.3 mmol/L (ref 3.5–5.3)
Sodium: 140 mmol/L (ref 135–146)
Total Bilirubin: 0.8 mg/dL (ref 0.2–1.2)
Total Protein: 6.1 g/dL (ref 6.1–8.1)

## 2020-11-26 LAB — TSH: TSH: 5.68 mIU/L — ABNORMAL HIGH (ref 0.40–4.50)

## 2020-11-26 LAB — PTH, INTACT AND CALCIUM
Calcium: 11.1 mg/dL — ABNORMAL HIGH (ref 8.6–10.4)
PTH: 75 pg/mL — ABNORMAL HIGH (ref 14–64)

## 2020-12-09 ENCOUNTER — Ambulatory Visit: Payer: PPO | Admitting: Pharmacist

## 2020-12-09 ENCOUNTER — Telehealth: Payer: Self-pay | Admitting: *Deleted

## 2020-12-09 DIAGNOSIS — I10 Essential (primary) hypertension: Secondary | ICD-10-CM

## 2020-12-09 DIAGNOSIS — E039 Hypothyroidism, unspecified: Secondary | ICD-10-CM

## 2020-12-09 MED ORDER — LEVOTHYROXINE SODIUM 150 MCG PO TABS: 150.0000 ug | ORAL_TABLET | Freq: Every day | ORAL | 0 refills | Status: DC

## 2020-12-09 NOTE — Chronic Care Management (AMB) (Signed)
Chronic Care Management Pharmacy  Name: [name]  MRN: [id number] DOB: [birth year]  Initial Questions: 1. Have you seen any other providers since your last visit? Yes  2. Any changes in your medicines or health? No   Chief Complaint/ HPI  [name],  85 y.o. , female presents for their Follow-Up CCM visit with the clinical pharmacist via telephone due to COVID-19 Pandemic.  PCP : Caren Macadam, MD  Their chronic conditions include: HTN, asthma, GERD, Hypothyroidism, Osteoarthritis, CKD, stage 4, Hypercholesterolemia, Urinary incontinence.   Office Visits: 11/18/20 Ofilia Neas, LPN: Patient presented for medicare annual wellness visit.  07/29/20 Micheline Rough, MD - Patient presented for annual exam. BP was elevated in office. TSH was elevated and levothyroxine increased to 150 mcg/day. Recheck TSH on 10/18. Vit D still low, recommended 2000 units daily. LDL elevated. Patient has not been checking BP at home and was not taking levothyroxine on an empty stomach. Follow up in 6 months.  Consult Visit: 04/14/20 Danella Sensing (dermatology): Patient presented for epidermal cyst follow up. Unable to access notes.  02/19/20 Richarda Osmond (ophthalmology): Patient presented for annual exam and IOL check. Patient needs new glasses.  12/31/2019- Discharged from ambulatory surgery (Right carpal tunnel syndrome).   12/25/2019- Orthopedics- Patient presented to Dr. Edmonia Lynch, MD. Patient presented with cortisone injections versus endoscopic carpal tunnel release. Patient preferred to go forward with surgery.   12/17/2019- Nephrology- Patient presented to Dr. Jamal Maes.   09/18/2019- General surgery- Patient presented to Dr. Armandina Gemma for hyperparathyroidism/ hypercalcemia.   Medications: Outpatient Encounter Medications as of 12/09/2020  Medication Sig Note  . DULoxetine (CYMBALTA) 30 MG capsule TAKE ONE CAPSULE BY MOUTH BEFORE BREAKFAST   . esomeprazole (NEXIUM) 40 MG  capsule Take 1 capsule (40 mg total) by mouth daily before breakfast. (Patient taking differently: Take 40 mg by mouth daily before breakfast. Takes 20mg  once daily)   . furosemide (LASIX) 40 MG tablet Patient reports taking three times a week (Patient not taking: Reported on 11/18/2020) 01/26/2016: Received from: External Pharmacy  . HYDROcodone-acetaminophen (NORCO) 5-325 MG tablet Take 1-2 tablets by mouth every 6 (six) hours as needed for severe pain. (Patient not taking: Reported on 11/18/2020)   . mirabegron ER (MYRBETRIQ) 25 MG TB24 tablet TAKE ONE TABLET BY MOUTH EVERYDAY AT BEDTIME   . montelukast (SINGULAIR) 10 MG tablet TAKE ONE TABLET BY MOUTH BEFORE BREAKFAST (Patient not taking: No sig reported)   . ondansetron (ZOFRAN) 4 MG tablet Take 1 tablet (4 mg total) by mouth every 8 (eight) hours as needed for nausea or vomiting. (Patient not taking: Reported on 11/18/2020)   . simvastatin (ZOCOR) 20 MG tablet Take 1 tablet (20 mg total) by mouth daily.   . [DISCONTINUED] amLODipine (NORVASC) 10 MG tablet Take 10 mg by mouth daily.   . [DISCONTINUED] levothyroxine (SYNTHROID) 150 MCG tablet Take 1 tablet (150 mcg total) by mouth daily. (Patient taking differently: Take 150 mcg by mouth daily. Take 1.5 tablet once a week)    No facility-administered encounter medications on file as of 12/09/2020.     Current Diagnosis/Assessment:  Goals Addressed            This Visit's Progress   . Pharmacy Care Plan       Current Barriers:  . Chronic Disease Management support, education, and care coordination needs related to HTN and HLD, Asthma, GERD, Hypothyroidism, Osteoarthritis, CKD, stage 4, Urinary incontinence   Pharmacist Clinical Goal(s):  [name] Kitchen Maintain Blood  pressure <130/80 mmHg  . Prevent worsening of shortness or breath and hospitalizations . Cholesterol goals: Total Cholesterol goal under 200, Triglycerides goal under 150, HDL goal above 40 (men) or above 50 (women), LDL goal under 100.   [name] Kitchen Achieve TSH between 0.45 to 4.5uIU/ml . Minimize reflux symptoms  . Maintain Vitamin D-25 level at or above 30.  . Reduce urinary symptoms    Interventions: . Comprehensive medication review performed. . Collaboration with provider re: medication management . Discussed diet and exercise modifications and effect on blood pressure and cholesterol. . Discussed DASH diet:  following a diet emphasizing fruits and vegetables and low-fat dairy products along with whole grains, fish, poultry, and nuts. Reducing red meats and sugars.  . Discussed modifying their lifestyle, including initial attempts to lose 5 to 10% of body weight.   Engaging in at least 150 minutes per week of moderate-intensity exercise such as brisk walking (15- to 20-minute mile) or something similar. Recommended they incorporate flexibility, balance, and some type of strength training exercises.    . Discussed non-pharmacological interventions for acid reflux. Take measures to prevent acid reflux, such as avoiding spicy foods, avoiding caffeine, avoid laying down a few hours after eating, and raising the head of the bed.  Patient Self Care Activities:  . Calls provider office for new concerns or questions . Continue taking medications as directed by your providers.  . Make sure you are taking vitamin D3 2,000 units once daily. . Check your blood pressure at home at least twice weekly to make sure your medications are working properly. . Start taking levothyroxine 150 mcg dose daily and 1 and 1/2 tablets on one day  Please see past updates related to this goal by clicking on the "Past Updates" button in the selected goal         Asthma    Eosinophil count:   Lab Results  Component Value Date/Time   EOSPCT 2.4 07/29/2020 11:52 AM  %                               Eos (Absolute):  Lab Results  Component Value Date/Time   EOSABS 130 07/29/2020 11:52 AM    Tobacco Status:  Social History   Tobacco Use  Smoking  Status Former Smoker  . Packs/day: 0.25  . Years: 6.00  . Pack years: 1.50  . Types: Cigarettes  . Quit date: 12/19/1978  . Years since quitting: 42.1  Smokeless Tobacco Never Used   Patient has failed these meds in past: Qvar, Dulera  Per patient report, patient is currently controlled on the following medications:  - montelukast 10mg , 1 tablet daily - albuterol HFA inhaler (has not needed to use over a year)   Using maintenance inhaler regularly? No Frequency of rescue inhaler use:  never  We discussed:  proper inhaler technique  Plan Continue current medications ,  Hypertension   Office blood pressures are  BP Readings from Last 3 Encounters:  11/18/20 132/76  07/29/20 (!) 160/100  01/29/20 128/80   Patient has failed these meds in the past: benazepril, Benicar, HCTZ, losartan, olmesartan,   Patient checks BP at home infrequently  Patient home BP readings are ranging: does not remember BP readings.   Patient is controlled on:  - amlodipine 10mg , 1 tablet once daily   We discussed diet and exercise extensively  - DASH eating plan recommendations: . Emphasizes vegetables, fruits, and whole-grains . Includes  fat-free or low-fat dairy products, fish, poultry, beans, nuts, and vegetable oils . Limits foods that are high in saturated fat. These foods include fatty meats, full-fat dairy products, and tropical oils such as coconut, palm kernel, and palm oils. . Limits sugar-sweetened beverages and sweets . Limiting sodium intake to < 1500 mg/day  Plan Patient will check blood pressure once a week.  Hyperlipidemia  LDL goal of < 100  Lipid Panel     Component Value Date/Time   CHOL 218 (H) 07/29/2020 1152   TRIG 160 (H) 07/29/2020 1152   TRIG 138 10/27/2006 0950   HDL 55 07/29/2020 1152   CHOLHDL 4.0 07/29/2020 1152   VLDL 26.6 01/29/2020 1228   LDLCALC 133 (H) 07/29/2020 1152   LDLDIRECT 174.2 07/09/2007 1134     Patient has failed these meds in past:  none  Patient is currently controlled on the following medications:  - simvastatin 20mg , 1 tablet at bedtime  We discussed:  diet and exercise extensively  - diet: patient endorses diet is not as good as it should be.   -exercise: we discussed adding in walking (15-20 minutes) a few days per week and patient thinks she will be able to do this - [place] -Recommended setting a timer to remember to take medications  Plan Continue current medications.   Consider escalating statin dose for further LDL lowering based on LDL > 100. Will reach out to PCP.  Hypothyroidism   TSH  Date Value Ref Range Status  11/25/2020 5.68 (H) 0.40 - 4.50 mIU/L Final    Patient has failed these meds in past: none  Patient is currently controlled on the following medications: - levothyroxine 150 mcg, 1 tablet before breakfast and 1 and 1/2 tablet once weeky  We discussed: patient was unaware of the dose change and has not received from the pharmacy  Plan Continue current medications.  GERD  Patient has failed these meds in past:none  Patient is currently controlled on the following medications:  - No medications   We discussed:  non-pharmacological interventions for acid reflux. Take measures to prevent acid reflux, such as avoiding spicy foods, avoiding caffeine, avoid laying down a few hours after eating, and raising the head of the bed.  -Patient is no longer taking esomeprazole and heartburn is controlled  Plan  Continue current medications  Osteoarthritis  Patient has failed these meds in past: Tramadol (nauseous), Tylenol  Patient is currently controlled on the following medications: no medications.   Plan Patient states pain is minimal.  Continue as is.   CKD, stage 4  07/29/2020 Creatinine: 1.59  03/04/2020 Creatinine: 1.40 GFR: 35.9  Plan Decreased kidney function.  Advised patient to maintain adequate fluid intake.   Urinary incontinence   Patient has failed these meds in past: Vesicare (solifenacin), Detrol LA (tolterodine)  Patient is currently controlled on the following medications: - Myrbetriq 25mg , 1 tablet daily  Plan Continue current medications  Leg swelling   Patient has failed these meds in past: Patient is currently controlled on the following medications: - furosemide 40mg , 1 tablet daily as needed (3x a week).   Plan Continue current medications.   Vitamin D deficiency    Last vitamin D Lab Results  Component Value Date   VD25OH 22 (L) 07/29/2020    Patient is currently controlled on the following medications:  - Vitamin D3 1 tablet daily  We discussed: patient is not taking vitamin D as recommended  2000 units  a day  Plan Continue current medications  Nerve pain  Patient has failed these meds in past: none  Patient is currently controlled on the following medications:  - duloxetine 30mg , 1 capsule daily   Plan Continue current medications  Allergies   Patient has failed these meds in past: none  Patient is currently controlled on the following medications:  - Xyzal 5mg , 1 tablet daily at bedtime.   Plan Continue current medications.   Medication Management   Pt uses [facility] for all medications Uses pill box? No - uses compliance packaging with [facility]  We discussed: Discussed benefits of medication synchronization, packaging and delivery as well as enhanced pharmacist oversight with [facility].  Plan  Utilize [facility] pharmacy for medication synchronization, packaging and delivery   Follow up: 3 month phone visit  Coordinated acute fill for levothyroxine with dose change and patient will receive enough to get her to next fill.  [name], PharmD Clinical Pharmacist [facility] [place]

## 2020-12-09 NOTE — Telephone Encounter (Signed)
Rx done. 

## 2020-12-09 NOTE — Telephone Encounter (Signed)
-----   Message from Viona Gilmore, [facility] sent at 12/09/2020  2:55 PM EST ----- Regarding: Levothyroxine refill Hi!  I see that Dr. Ethlyn Gallery wanted [name] to increase levothyroxine to 1.5 tablet on one day of the week and then 1 tablet all other days. Would you be able to send in a new prescription for these updated directions? [name] gets her medications in adherence packaging with [facility] so they need the exact directions in order to include that extra 1/2 tablet per week.  Thank you, Maddie

## 2020-12-16 ENCOUNTER — Other Ambulatory Visit: Payer: Self-pay | Admitting: Family Medicine

## 2020-12-16 ENCOUNTER — Telehealth: Payer: Self-pay | Admitting: Pharmacist

## 2020-12-16 MED ORDER — AMLODIPINE BESYLATE 10 MG PO TABS: 10.0000 mg | ORAL_TABLET | Freq: Every day | ORAL | 3 refills | Status: DC

## 2020-12-16 NOTE — Chronic Care Management (AMB) (Signed)
Chronic Care Management Pharmacy Assistant   Name: [name]  MRN: [id number] DOB: 1935/04/21  Reason for Encounter: Medication Review  PCP : Caren Macadam, MD  Allergies:  No Known Allergies  Medications: Outpatient Encounter Medications as of 12/16/2020  Medication Sig Note  . amLODipine (NORVASC) 10 MG tablet Take 10 mg by mouth daily.   . DULoxetine (CYMBALTA) 30 MG capsule TAKE ONE CAPSULE BY MOUTH BEFORE BREAKFAST   . esomeprazole (NEXIUM) 40 MG capsule Take 1 capsule (40 mg total) by mouth daily before breakfast. (Patient taking differently: Take 40 mg by mouth daily before breakfast. Takes 20mg  once daily)   . furosemide (LASIX) 40 MG tablet Patient reports taking three times a week (Patient not taking: Reported on 11/18/2020) 01/26/2016: Received from: External Pharmacy  . HYDROcodone-acetaminophen (NORCO) 5-325 MG tablet Take 1-2 tablets by mouth every 6 (six) hours as needed for severe pain. (Patient not taking: Reported on 11/18/2020)   . levothyroxine (SYNTHROID) 150 MCG tablet Take 1 tablet (150 mcg total) by mouth daily. Take 1.5 tablet once a week   . mirabegron ER (MYRBETRIQ) 25 MG TB24 tablet TAKE ONE TABLET BY MOUTH EVERYDAY AT BEDTIME   . montelukast (SINGULAIR) 10 MG tablet TAKE ONE TABLET BY MOUTH BEFORE BREAKFAST (Patient not taking: No sig reported)   . ondansetron (ZOFRAN) 4 MG tablet Take 1 tablet (4 mg total) by mouth every 8 (eight) hours as needed for nausea or vomiting. (Patient not taking: Reported on 11/18/2020)   . simvastatin (ZOCOR) 20 MG tablet Take 1 tablet (20 mg total) by mouth daily.    No facility-administered encounter medications on file as of 12/16/2020.    Current Diagnosis: Patient Active Problem List   Diagnosis Date Noted  . Sinusitis chronic, frontal 11/06/2015  . Cough variant asthma 09/23/2015  . Chronic cough 04/09/2015  . Acute renal failure superimposed on stage 4 chronic kidney disease ([place]) 04/09/2015  . Obesity  11/22/2011  . History of colonic polyps 11/06/2009  . NEPHROLITHIASIS, HX OF 08/07/2008  . Osteoarthritis 05/07/2008  . Disorder resulting from impaired renal function 05/06/2008  . DOE (dyspnea on exertion) 05/06/2008  . HYPERCHOLESTEROLEMIA, PURE 07/09/2007  . Hypothyroidism 06/18/2007  . Essential hypertension 06/18/2007  . Asthma 06/18/2007  . GERD 06/18/2007    Goals Addressed   None     Reviewed chart for medication changes ahead of medication coordination call. No OVs, Consults, or hospital visits since last care coordination call/Pharmacist visit.  No medication changes indicated.  BP Readings from Last 3 Encounters:  11/18/20 132/76  07/29/20 (!) 160/100  01/29/20 128/80    No results found for: HGBA1C   Patient obtains medications through Adherence Packaging  30 Days  Last adherence delivery included:  [name] Kitchen Duloxetine 30 mg: one tablet before breakfast . Levothyroxine 150 mcg; one  tablet before breakfast . Amlodipine 10 mg; one tablet before breakfast . Furosemide 40 mg; one tablet before breakfast Monday, Wednesday, Friday . Montelukast 10 mg; one tablet before breakfast . Simvastatin 20 mg; one tablet at bedtime . Myrbetriq 25 mg; one tablet at bedtime  Patient is due for next adherence delivery on: 12-23-2020. Called patient and reviewed medications and coordinated delivery. This delivery to include: [name] Kitchen Duloxetine 30 mg: one tablet before breakfast . Amlodipine 10 mg; one tablet before breakfast . Furosemide 40 mg; one tablet before breakfast Monday, Wednesday, Friday . Montelukast 10 mg; one tablet before breakfast . Simvastatin 20 mg; one tablet at bedtime . Myrbetriq  25 mg; one tablet at bedtime  Patient declined the following medications . Levothyroxine 150 mcg; one and half (1.5) tablet before breakfast . Ondansetron (Zofran) 4 mg PRN  Patient needs refills for Amlodipine 10 mg tablet.  Confirmed delivery date of 12-23-2020, advised patient that  pharmacy will contact them the morning of delivery.  Follow-Up:  Coordination of Enhanced Pharmacy Services   Amilia ([name]) Mare Ferrari, [place] Assistant [phone]

## 2021-01-01 ENCOUNTER — Telehealth: Payer: Self-pay | Admitting: Pharmacist

## 2021-01-01 NOTE — Chronic Care Management (AMB) (Signed)
Chronic Care Management Pharmacy Assistant   Name: [name]  MRN: [id number] DOB: 1935/07/11  Reason for Encounter: Medication Review  PCP : Caren Macadam, MD  Allergies:  No Known Allergies  Medications: Outpatient Encounter Medications as of 01/01/2021  Medication Sig Note  . amLODipine (NORVASC) 10 MG tablet Take 1 tablet (10 mg total) by mouth daily.   . DULoxetine (CYMBALTA) 30 MG capsule TAKE ONE CAPSULE BY MOUTH BEFORE BREAKFAST   . esomeprazole (NEXIUM) 40 MG capsule Take 1 capsule (40 mg total) by mouth daily before breakfast. (Patient taking differently: Take 40 mg by mouth daily before breakfast. Takes 20mg  once daily)   . furosemide (LASIX) 40 MG tablet Patient reports taking three times a week (Patient not taking: Reported on 11/18/2020) 01/26/2016: Received from: External Pharmacy  . HYDROcodone-acetaminophen (NORCO) 5-325 MG tablet Take 1-2 tablets by mouth every 6 (six) hours as needed for severe pain. (Patient not taking: Reported on 11/18/2020)   . levothyroxine (SYNTHROID) 150 MCG tablet Take 1 tablet (150 mcg total) by mouth daily. Take 1.5 tablet once a week   . mirabegron ER (MYRBETRIQ) 25 MG TB24 tablet TAKE ONE TABLET BY MOUTH EVERYDAY AT BEDTIME   . montelukast (SINGULAIR) 10 MG tablet TAKE ONE TABLET BY MOUTH BEFORE BREAKFAST (Patient not taking: No sig reported)   . ondansetron (ZOFRAN) 4 MG tablet Take 1 tablet (4 mg total) by mouth every 8 (eight) hours as needed for nausea or vomiting. (Patient not taking: Reported on 11/18/2020)   . simvastatin (ZOCOR) 20 MG tablet Take 1 tablet (20 mg total) by mouth daily.    No facility-administered encounter medications on file as of 01/01/2021.    Current Diagnosis: Patient Active Problem List   Diagnosis Date Noted  . Sinusitis chronic, frontal 11/06/2015  . Cough variant asthma 09/23/2015  . Chronic cough 04/09/2015  . Acute renal failure superimposed on stage 4 chronic kidney disease ([place]) 04/09/2015  .  Obesity 11/22/2011  . History of colonic polyps 11/06/2009  . NEPHROLITHIASIS, HX OF 08/07/2008  . Osteoarthritis 05/07/2008  . Disorder resulting from impaired renal function 05/06/2008  . DOE (dyspnea on exertion) 05/06/2008  . HYPERCHOLESTEROLEMIA, PURE 07/09/2007  . Hypothyroidism 06/18/2007  . Essential hypertension 06/18/2007  . Asthma 06/18/2007  . GERD 06/18/2007    Goals Addressed   None    Reviewed chart for medication changes ahead of medication coordination call. No OVs, Consults, or hospital visits since last care coordination call/Pharmacist visit. No medication changes indicated.  BP Readings from Last 3 Encounters:  11/18/20 132/76  07/29/20 (!) 160/100  01/29/20 128/80    No results found for: HGBA1C   Patient obtains medications through Adherence Packaging  30 Days  Last adherence delivery included:   Duloxetine 30 mg: one tablet before breakfast  Levothyroxine 150 mcg; one  tablet before breakfast  Amlodipine 10 mg; one tablet before breakfast  Furosemide 40 mg; one tablet before breakfast Monday, Wednesday, Friday  Montelukast 10 mg; one tablet before breakfast  Simvastatin 20 mg; one tablet at bedtime  Myrbetriq 25 mg; one tablet at bedtime  Patient is due for next adherence delivery on: 01/15/2021. Called patient and reviewed medications and coordinated delivery. This delivery to include:  Duloxetine 30 mg: one tablet before breakfast  Levothyroxine 150 mcg; one and half  (1.5)  tablet before breakfast  Amlodipine 10 mg; one tablet before breakfast  Furosemide 40 mg; one tablet before breakfast Monday, Wednesday, Friday  Montelukast 10 mg;  one tablet before breakfast  Simvastatin 20 mg; one tablet at bedtime  Myrbetriq 25 mg; one tablet at bedtime   She currently does not need any refills. Confirmed delivery date of 01/15/2021, advised patient that pharmacy will contact them the morning of delivery.  Follow-Up:  Coordination of  Enhanced Pharmacy Services and Pharmacist Review  Maia Breslow, [place] Assistant [phone]

## 2021-01-25 ENCOUNTER — Telehealth: Payer: Self-pay | Admitting: Pharmacist

## 2021-01-25 NOTE — Chronic Care Management (AMB) (Signed)
Error

## 2021-01-29 ENCOUNTER — Other Ambulatory Visit: Payer: Self-pay

## 2021-02-01 ENCOUNTER — Ambulatory Visit: Payer: PPO | Admitting: Family Medicine

## 2021-02-01 DIAGNOSIS — N184 Chronic kidney disease, stage 4 (severe): Secondary | ICD-10-CM | POA: Insufficient documentation

## 2021-02-01 NOTE — Progress Notes (Deleted)
  [name] DOB: [birth year] Encounter date: 02/01/2021  This is a 86 y.o. female who presents with No chief complaint on file.   History of present illness: Last visit with me was 07/2020. She has met with nurse for AWV as well as pharmacist for CCM since that time.  HTN: has not been checking at home regularly. Amlodipine  Asthma: has been doing pretty well. Little allergy cough. No fevers, chills. Does still take the montelukast. Hasn't used inhalers at all.   Vit D def: has been taking the 50,000 supplement.   Hasn't needed furosemide because hasn't had swelling.   Nephrologist has retired, so she hasn't gone back for visit.  States she will make appointment. HPI   No Known Allergies No outpatient medications have been marked as taking for the 02/01/21 encounter (Appointment) with Caren Macadam, MD.    Review of Systems  Objective:  There were no vitals taken for this visit.      BP Readings from Last 3 Encounters:  11/18/20 132/76  07/29/20 (!) 160/100  01/29/20 128/80   Wt Readings from Last 3 Encounters:  11/18/20 211 lb 7 oz (95.9 kg)  07/29/20 237 lb 12.8 oz (107.9 kg)  01/29/20 229 lb 8 oz (104.1 kg)    Physical Exam  Assessment/Plan  There are no diagnoses linked to this encounter.       Micheline Rough, MD

## 2021-02-12 ENCOUNTER — Telehealth: Payer: Self-pay | Admitting: Pharmacist

## 2021-02-12 NOTE — Chronic Care Management (AMB) (Addendum)
Chronic Care Management Pharmacy Assistant   Name: [name]  MRN: [id number] DOB: May 08, 1935  Reason for Encounter: Medication Review  Patient Questions:  1.  Have you seen any other providers since your last visit? No  2.  Any changes in your medicines or health? No  PCP : Caren Macadam, MD  Allergies:  No Known Allergies  Medications: Outpatient Encounter Medications as of 02/12/2021  Medication Sig Note   amLODipine (NORVASC) 10 MG tablet Take 1 tablet (10 mg total) by mouth daily.    DULoxetine (CYMBALTA) 30 MG capsule TAKE ONE CAPSULE BY MOUTH BEFORE BREAKFAST    esomeprazole (NEXIUM) 40 MG capsule Take 1 capsule (40 mg total) by mouth daily before breakfast. (Patient taking differently: Take 40 mg by mouth daily before breakfast. Takes 20mg  once daily)    furosemide (LASIX) 40 MG tablet Patient reports taking three times a week (Patient not taking: Reported on 11/18/2020) 01/26/2016: Received from: External Pharmacy   HYDROcodone-acetaminophen (NORCO) 5-325 MG tablet Take 1-2 tablets by mouth every 6 (six) hours as needed for severe pain. (Patient not taking: Reported on 11/18/2020)    levothyroxine (SYNTHROID) 150 MCG tablet Take 1 tablet (150 mcg total) by mouth daily. Take 1.5 tablet once a week    mirabegron ER (MYRBETRIQ) 25 MG TB24 tablet TAKE ONE TABLET BY MOUTH EVERYDAY AT BEDTIME    montelukast (SINGULAIR) 10 MG tablet TAKE ONE TABLET BY MOUTH BEFORE BREAKFAST (Patient not taking: No sig reported)    ondansetron (ZOFRAN) 4 MG tablet Take 1 tablet (4 mg total) by mouth every 8 (eight) hours as needed for nausea or vomiting. (Patient not taking: Reported on 11/18/2020)    simvastatin (ZOCOR) 20 MG tablet Take 1 tablet (20 mg total) by mouth daily.    No facility-administered encounter medications on file as of 02/12/2021.    Current Diagnosis: Patient Active Problem List   Diagnosis Date Noted   Chronic kidney disease (CKD), stage IV (severe) ([place]) 02/01/2021    Sinusitis chronic, frontal 11/06/2015   Cough variant asthma 09/23/2015   Chronic cough 04/09/2015   Acute renal failure superimposed on stage 4 chronic kidney disease ([place]) 04/09/2015   Obesity 11/22/2011   History of colonic polyps 11/06/2009   NEPHROLITHIASIS, HX OF 08/07/2008   Osteoarthritis 05/07/2008   Disorder resulting from impaired renal function 05/06/2008   DOE (dyspnea on exertion) 05/06/2008   HYPERCHOLESTEROLEMIA, PURE 07/09/2007   Hypothyroidism 06/18/2007   Essential hypertension 06/18/2007   Asthma 06/18/2007   GERD 06/18/2007    Goals Addressed   None    Reviewed chart for medication changes ahead of medication coordination call.  BP Readings from Last 3 Encounters:  11/18/20 132/76  07/29/20 (!) 160/100  01/29/20 128/80    No results found for: HGBA1C   Patient obtains medications through Adherence Packaging  30 Days   Last adherence delivery included:  Duloxetine 30mg : one tablet before breakfast Levothyroxine 150 mcg: one and half (1.5) tablet before breakfast Amlodipine 10mg :one tablet before breakfast Furosemide 40mg : one tablet before breakfast Monday, Wednesday, Friday Montelukast 10mg : one tablet before breakfast Simvastatin 20mg :one tablet at bedtime Myrbetriq 25mg :one tablet at bedtime  Patient is due for next adherence delivery on: 02/18/2021. Called patient and reviewed medications and coordinated delivery.  This delivery to include: Duloxetine 30mg : one tablet before breakfast Levothyroxine 150 mcg: one and half (1.5) tablet before breakfast on Mondays and 1 tablet before breakfast on all other days Amlodipine 10mg :one tablet before breakfast Furosemide  40mg : one tablet before breakfast Monday, Wednesday, Friday Montelukast 10mg : one tablet before breakfast Simvastatin 20mg :one tablet at bedtime Myrbetriq 25mg :one tablet at bedtime  She currently does not need refills Confirmed delivery date of 02/18/2021, advised patient that  pharmacy will contact them the morning of delivery.  Follow-Up:  Coordination of Enhanced Pharmacy Services and Pharmacist Review   Maia Breslow, [place] Assistant [phone]

## 2021-02-23 DIAGNOSIS — H52203 Unspecified astigmatism, bilateral: Secondary | ICD-10-CM | POA: Diagnosis not present

## 2021-02-23 DIAGNOSIS — Z961 Presence of intraocular lens: Secondary | ICD-10-CM | POA: Diagnosis not present

## 2021-02-23 DIAGNOSIS — H5213 Myopia, bilateral: Secondary | ICD-10-CM | POA: Diagnosis not present

## 2021-02-23 DIAGNOSIS — H524 Presbyopia: Secondary | ICD-10-CM | POA: Diagnosis not present

## 2021-03-05 ENCOUNTER — Ambulatory Visit: Payer: PPO | Admitting: Family Medicine

## 2021-03-08 ENCOUNTER — Other Ambulatory Visit: Payer: Self-pay | Admitting: Family Medicine

## 2021-03-12 ENCOUNTER — Ambulatory Visit (INDEPENDENT_AMBULATORY_CARE_PROVIDER_SITE_OTHER): Payer: PPO | Admitting: Pharmacist

## 2021-03-12 DIAGNOSIS — I10 Essential (primary) hypertension: Secondary | ICD-10-CM | POA: Diagnosis not present

## 2021-03-12 DIAGNOSIS — E039 Hypothyroidism, unspecified: Secondary | ICD-10-CM

## 2021-03-12 NOTE — Progress Notes (Signed)
Chronic Care Management Pharmacy Note  03/18/2021 Name:  [name] MRN:  [id number] DOB:  1935/11/28  Subjective: [name] is an 86 y.o. year old female who is a primary patient of Koberlein, Steele Berg, MD.  The CCM team was consulted for assistance with disease management and care coordination needs.    Engaged with patient by telephone for follow up visit in response to provider referral for pharmacy case management and/or care coordination services.   Consent to Services:  The patient was given information about Chronic Care Management services, agreed to services, and gave verbal consent prior to initiation of services.  Please see initial visit note for detailed documentation.   Patient Care Team: Caren Macadam, MD as PCP - General (Family Medicine) Jamal Maes, MD as Consulting Physician (Nephrology) Viona Gilmore, [facility] as Pharmacist (Pharmacist)  Recent office visits: 11/25/20 Lab visit: TSH still elevated. Recommended increasing to 1.5 tabs on one day of the week and 1 tablet all other days.  11/18/20 Ofilia Neas, LPN: Patient presented for medicare annual wellness visit.  07/29/20 Micheline Rough, MD - Patient presented for annual exam. BP was elevated in office. TSH was elevated and levothyroxine increased to 150 mcg/day. Recheck TSH on 10/18. Vit D still low, recommended 2000 units daily. LDL elevated. Patient has not been checking BP at home and was not taking levothyroxine on an empty stomach. Follow up in 6 months.  Recent consult visits: 02/23/21 Rutherford Guys, MD (optometry): Patient presented for annual eye exam.  04/14/20 Danella Sensing (dermatology): Patient presented for epidermal cyst follow up. Unable to access notes.  Hospital visits: None in previous 6 months  Objective:  Lab Results  Component Value Date   CREATININE 1.42 (H) 11/25/2020   BUN 17 11/25/2020   GFR 35.96 (L) 03/04/2020   GFRNONAA 27 (L) 11/06/2015   GFRAA 32 (L) 11/06/2015    NA 140 11/25/2020   K 4.3 11/25/2020   CALCIUM 11.1 (H) 11/25/2020   CALCIUM 11.1 (H) 11/25/2020   CO2 29 11/25/2020   GLUCOSE 92 11/25/2020    Lab Results  Component Value Date/Time   GFR 35.96 (L) 03/04/2020 02:17 PM   GFR 29.15 (L) 01/29/2020 12:28 PM    Last diabetic Eye exam: No results found for: HMDIABEYEEXA  Last diabetic Foot exam: No results found for: HMDIABFOOTEX   Lab Results  Component Value Date   CHOL 218 (H) 07/29/2020   HDL 55 07/29/2020   LDLCALC 133 (H) 07/29/2020   LDLDIRECT 174.2 07/09/2007   TRIG 160 (H) 07/29/2020   CHOLHDL 4.0 07/29/2020    Hepatic Function Latest Ref Rng & Units 11/25/2020 07/29/2020 03/04/2020  Total Protein 6.1 - 8.1 g/dL 6.1 5.9(L) 6.4  Albumin 3.5 - 5.2 g/dL - - 3.5  AST 10 - 35 U/L $Remo'12 13 14  'sZEUz$ ALT 6 - 29 U/L $Remo'6 9 13  'gisJh$ Alk Phosphatase 39 - 117 U/L - - 131(H)  Total Bilirubin 0.2 - 1.2 mg/dL 0.8 0.6 0.5  Bilirubin, Direct 0.0 - 0.3 mg/dL - - -    Lab Results  Component Value Date/Time   TSH 5.68 (H) 11/25/2020 11:33 AM   TSH 13.33 (H) 07/29/2020 11:52 AM    CBC Latest Ref Rng & Units 07/29/2020 03/04/2020 01/29/2020  WBC 3.8 - 10.8 Thousand/uL 5.4 6.5 3.5(L)  Hemoglobin 11.7 - 15.5 g/dL 14.2 13.5 13.1  Hematocrit 35.0 - 45.0 % 43.4 41.3 40.6  Platelets 140 - 400 Thousand/uL 226 221.0 181.0  Lab Results  Component Value Date/Time   VD25OH 22 (L) 07/29/2020 11:52 AM   VD25OH 15.35 (L) 01/29/2020 12:28 PM    Clinical ASCVD: No  The ASCVD Risk score Mikey Bussing DC Jr., et al., 2013) failed to calculate for the following reasons:   The 2013 ASCVD risk score is only valid for ages 51 to 16    Depression screen PHQ 2/9 11/18/2020 07/29/2020 02/01/2018  Decreased Interest 0 0 0  Down, Depressed, Hopeless 0 0 0  PHQ - 2 Score 0 0 0  Altered sleeping 0 - -  Tired, decreased energy 0 - -  Change in appetite 0 - -  Feeling bad or failure about yourself  0 - -  Trouble concentrating 0 - -  Moving slowly or fidgety/restless 0 - -   Suicidal thoughts 0 - -  PHQ-9 Score 0 - -  Difficult doing work/chores Not difficult at all - -      Social History   Tobacco Use  Smoking Status Former Smoker  . Packs/day: 0.25  . Years: 6.00  . Pack years: 1.50  . Types: Cigarettes  . Quit date: 12/19/1978  . Years since quitting: 42.2  Smokeless Tobacco Never Used   BP Readings from Last 3 Encounters:  11/18/20 132/76  07/29/20 (!) 160/100  01/29/20 128/80   Pulse Readings from Last 3 Encounters:  11/18/20 77  07/29/20 96  01/29/20 71   Wt Readings from Last 3 Encounters:  11/18/20 211 lb 7 oz (95.9 kg)  07/29/20 237 lb 12.8 oz (107.9 kg)  01/29/20 229 lb 8 oz (104.1 kg)   BMI Readings from Last 3 Encounters:  11/18/20 35.19 kg/m  07/29/20 39.88 kg/m  01/29/20 38.19 kg/m    Assessment/Interventions: Review of patient past medical history, allergies, medications, health status, including review of consultants reports, laboratory and other test data, was performed as part of comprehensive evaluation and provision of chronic care management services.   SDOH:  (Social Determinants of Health) assessments and interventions performed: No  SDOH Screenings   Alcohol Screen: Low Risk   . Last Alcohol Screening Score (AUDIT): 1  Depression (PHQ2-9): Low Risk   . PHQ-2 Score: 0  Financial Resource Strain: Low Risk   . Difficulty of Paying Living Expenses: Not hard at all  Food Insecurity: No Food Insecurity  . Worried About Charity fundraiser in the Last Year: Never true  . Ran Out of Food in the Last Year: Never true  Housing: Low Risk   . Last Housing Risk Score: 0  Physical Activity: Inactive  . Days of Exercise per Week: 0 days  . Minutes of Exercise per Session: 0 min  Social Connections: Moderately Isolated  . Frequency of Communication with Friends and Family: More than three times a week  . Frequency of Social Gatherings with Friends and Family: More than three times a week  . Attends Religious  Services: Never  . Active Member of Clubs or Organizations: Yes  . Attends Archivist Meetings: 1 to 4 times per year  . Marital Status: Widowed  Stress: No Stress Concern Present  . Feeling of Stress : Not at all  Tobacco Use: Medium Risk  . Smoking Tobacco Use: Former Smoker  . Smokeless Tobacco Use: Never Used  Transportation Needs: No Transportation Needs  . Lack of Transportation (Medical): No  . Lack of Transportation (Non-Medical): No    CCM Care Plan  No Known Allergies  Medications Reviewed Today  Reviewed by Viona Gilmore, [place] (Pharmacist) on 03/12/21 at 1044  Med List Status: <None>  Medication Order Taking? Sig Documenting Provider Last Dose Status Informant  amLODipine (NORVASC) 10 MG tablet [id number]  Take 1 tablet (10 mg total) by mouth daily. Caren Macadam, MD  Active   DULoxetine (CYMBALTA) 30 MG capsule [id number]  TAKE ONE CAPSULE BY MOUTH BEFORE BREAKFAST Koberlein, Junell C, MD  Active   esomeprazole (NEXIUM) 40 MG capsule [id number] Yes Take 1 capsule (40 mg total) by mouth daily before breakfast.  Patient taking differently: Take 40 mg by mouth daily before breakfast. Takes $RemoveBeforeDE'20mg'FJFnTWNGezjEYHf$  once daily   Marletta Lor, MD Taking Active Self  furosemide (LASIX) 40 MG tablet [id number] Yes Patient reports taking three times a week [provider] Taking Active            Med Note Titus Dubin, Pamalee Leyden   Tue Jan 26, 2016  2:54 PM) Received from: External Pharmacy  levothyroxine (SYNTHROID) 150 MCG tablet [id number] Yes Take 1 tablet (150 mcg total) by mouth daily. Take 1.5 tablet once a week Koberlein, Junell C, MD Taking Active   mirabegron ER (MYRBETRIQ) 25 MG TB24 tablet [id number] Yes TAKE ONE TABLET BY MOUTH EVERYDAY AT BEDTIME Koberlein, Junell C, MD Taking Active   montelukast (SINGULAIR) 10 MG tablet [id number] Yes TAKE ONE TABLET BY MOUTH BEFORE BREAKFAST Koberlein, Junell C, MD Taking Active   simvastatin (ZOCOR) 20 MG tablet [id number] Yes  TAKE ONE TABLET BY MOUTH EVERYDAY AT BEDTIME Caren Macadam, MD Taking Active           Patient Active Problem List   Diagnosis Date Noted  . Chronic kidney disease (CKD), stage IV (severe) ([place]) 02/01/2021  . Sinusitis chronic, frontal 11/06/2015  . Cough variant asthma 09/23/2015  . Chronic cough 04/09/2015  . Acute renal failure superimposed on stage 4 chronic kidney disease ([place]) 04/09/2015  . Obesity 11/22/2011  . History of colonic polyps 11/06/2009  . NEPHROLITHIASIS, HX OF 08/07/2008  . Osteoarthritis 05/07/2008  . Disorder resulting from impaired renal function 05/06/2008  . DOE (dyspnea on exertion) 05/06/2008  . HYPERCHOLESTEROLEMIA, PURE 07/09/2007  . Hypothyroidism 06/18/2007  . Essential hypertension 06/18/2007  . Asthma 06/18/2007  . GERD 06/18/2007    Immunization History  Administered Date(s) Administered  . Fluad Quad(high Dose 65+) 09/26/2019, 11/18/2020  . Influenza Split 09/20/2011, 09/12/2012  . Influenza Whole 10/19/2006, 10/18/2007, 09/19/2008, 10/02/2010  . Influenza, High Dose Seasonal PF 08/21/2015, 09/08/2016, 09/05/2017, 10/12/2018  . Influenza,inj,Quad PF,6+ Mos 10/04/2013, 09/12/2014  . Influenza-Unspecified 08/20/2019  . PFIZER(Purple Top)SARS-COV-2 Vaccination 01/07/2020, 01/27/2020, 10/09/2020  . Pneumococcal Conjugate-13 07/01/2014  . Pneumococcal Polysaccharide-23 09/18/2004  . Tdap 11/22/2011  . Zoster 05/06/2008  . Zoster Recombinat (Shingrix) 09/26/2019, 12/17/2019    Conditions to be addressed/monitored:  Hypertension, Hyperlipidemia, GERD, Asthma, Chronic Kidney Disease, Hypothyroidism, Osteoarthritis and Overactive Bladder  Care Plan : CCM Pharmacy Care Plan  Updates made by Viona Gilmore, [employer] since 03/18/2021 12:00 AM    Problem: Problem: Hypertension, Hyperlipidemia, GERD, Asthma, Chronic Kidney Disease, Hypothyroidism, Osteoarthritis and Overactive Bladder     Long-Range Goal: Patient-Specific Goal   Start Date:  03/12/2021  Expected End Date: 03/12/2022  This Visit's Progress: On track  Priority: High  Note:   Current Barriers:  . Unable to independently monitor therapeutic efficacy . Unable to self administer medications as prescribed . Does not maintain contact with provider office  Pharmacist Clinical Goal(s):  [name] Kitchen Patient will achieve control of hypothyroidism as  evidenced by TSH . achieve ability to self administer medications as prescribed through use of use of a timer as evidenced by patient report through collaboration with PharmD and provider.   Interventions: . 1:1 collaboration with Caren Macadam, MD regarding development and update of comprehensive plan of care as evidenced by provider attestation and co-signature . Inter-disciplinary care team collaboration (see longitudinal plan of care) . Comprehensive medication review performed; medication list updated in electronic medical record  Hypertension (BP goal <140/90) -Controlled -Current treatment: . amlodipine $RemoveBefo'10mg'xnwgXjfXWcH$ , 1 tablet once daily  -Medications previously tried: benazepril, Benicar, HCTZ, losartan, olmesartan -Current home readings: does not check at home -Current dietary habits: limiting salt intake -Current exercise habits: no structured exercise -Denies hypotensive/hypertensive symptoms -Educated on Exercise goal of 150 minutes per week; Importance of home blood pressure monitoring; -Counseled to monitor BP at home weekly, document, and provide log at future appointments -Recommended to continue current medication  Hyperlipidemia: (LDL goal < 100) -Uncontrolled -Current treatment: . simvastatin $RemoveBefor'20mg'iwHRsVamGBvF$ , 1 tablet at bedtime -Medications previously tried: none  -Current dietary patterns: patient reports diet is not as good as it should be -Current exercise habits: we discussed adding in walking (15-20 minutes) a few days per week and patient thinks she will be able to do this - she [place] a walk[place]  neighborhood -Educated on Cholesterol goals;  Importance of limiting foods high in cholesterol; Exercise goal of 150 minutes per week; -Recommended to continue current medication Recommended increasing cholesterol medication  Asthma (Goal: control symptoms) -Controlled -Current treatment   montelukast $RemoveBefo'10mg'oKrwamkvyWI$ , 1 tablet daily  albuterol HFA inhaler (has not needed to use over a year)  -Medications previously tried: Qvar, Dulera  -Exacerbations requiring treatment in last 6 months: none -Patient denies consistent use of maintenance inhaler -Frequency of rescue inhaler use: not often -Counseled on When to use rescue inhaler -Recommended to continue current medication  Hypothyroidism (Goal: TSH 0.35-4.5) -Uncontrolled -Current treatment  . levothyroxine 150 mcg, 1 tablet before breakfast and 1 and 1/2 tablet once weekly on Mondays -Medications previously tried: none  -Counseled on taking the medication on an empty stomach separate from all other medications  GERD (Goal: minimize symptoms) -Controlled -Current treatment  . Esomeprazole 20 mg once a week or less -Medications previously tried: none  -Counseled on non-pharmacological interventions for acid reflux. Take measures to prevent acid reflux, such as avoiding spicy foods, avoiding caffeine, avoid laying down a few hours after eating, and raising the head of the bed  Pain (Goal: minimize pain) -Controlled -Current treatment  . Tylenol 325 mg as needed -Medications previously tried: Tramadol (nauseous)  -Recommended to continue current medication  Overactive bladder (Goal: minimize symptoms) -Not ideally controlled -Current treatment  . Myrbetriq $RemoveBef'25mg'OEBdNOHqCr$ , 1 tablet daily -Medications previously tried: Vesicare (solifenacin), Detrol LA (tolterodine) -Recommended to continue current medication  Leg swelling (Goal: minimize swelling of lower extremities) -Controlled -Current treatment  . furosemide $RemoveBefo'40mg'mrfykenohca$ , 1 tablet daily as  needed (3x a week) -Medications previously tried: none  -Recommended to continue current medication  Vitamin D deficiency (Goal: vitamin D 30-100) -Uncontrolled -Current treatment  . No medications -Medications previously tried: n/a  - Patient unaware she should be taking vitamin D daily. Plan to add 2000 units daily to packaging.   Health Maintenance -Vaccine gaps: none -Current therapy:  . No medications -Educated on Cost vs benefit of each product must be carefully weighed by individual consumer -Patient is satisfied with current therapy and denies issues -Recommended to continue as is  Patient Goals/Self-Care Activities . Patient will:  - take medications as prescribed focus on medication adherence by setting a timer to take your medications check blood pressure weekly, document, and provide at future appointments  Follow Up Plan: Telephone follow up appointment with care management team member scheduled for: 4 months       Medication Assistance: None required.  Patient affirms current coverage meets needs.  Patient's preferred pharmacy is:  [facility], [place] [address] [place] [address] Phone: [phone] Fax: [phone]  [facility] [place], [place] [address] Phone: [phone] Fax: [phone]  [id number] - [place], [place] - [address] [place] [address] Phone: [phone] Fax: [phone]  [facility] - [place], [place] - [address] [place] [address] Phone: [phone] Fax: [phone]  Uses pill box? No - adherence packaging Pt endorses 80% compliance  We discussed: Benefits of medication synchronization, packaging and delivery as well as enhanced pharmacist oversight with  [facility]. Patient decided to: Utilize [facility] pharmacy for medication synchronization, packaging and delivery  Care Plan and Follow Up Patient Decision:  Patient agrees to Care Plan and Follow-up.  Plan: Telephone follow up appointment with care management team member scheduled for:  4 months  [name], PharmD [place] Pharmacist [place] [facility] [place] [phone]

## 2021-03-18 NOTE — Patient Instructions (Addendum)
Hi Adira,  It was great to get to speak with you again! Below is a summary of some of the topics we discussed.  Please start checking your blood pressure at home as we discussed!  Please reach out to me if you have any questions or need anything before our follow up!  Best, Maddie  [name], PharmD, [facility]  Visit Information  Goals Addressed   None    Patient Care Plan: CCM Pharmacy Care Plan    Problem Identified: Problem: Hypertension, Hyperlipidemia, GERD, Asthma, Chronic Kidney Disease, Hypothyroidism, Osteoarthritis and Overactive Bladder     Long-Range Goal: Patient-Specific Goal   Start Date: 03/12/2021  Expected End Date: 03/12/2022  This Visit's Progress: On track  Priority: High  Note:   Current Barriers:  . Unable to independently monitor therapeutic efficacy . Unable to self administer medications as prescribed . Does not maintain contact with provider office  Pharmacist Clinical Goal(s):  [name] Kitchen Patient will achieve control of hypothyroidism as evidenced by TSH . achieve ability to self administer medications as prescribed through use of use of a timer as evidenced by patient report through collaboration with PharmD and provider.   Interventions: . 1:1 collaboration with Caren Macadam, MD regarding development and update of comprehensive plan of care as evidenced by provider attestation and co-signature . Inter-disciplinary care team collaboration (see longitudinal plan of care) . Comprehensive medication review performed; medication list updated in electronic medical record  Hypertension (BP goal <140/90) -Controlled -Current treatment: . amlodipine 10mg , 1 tablet once daily  -Medications previously tried: benazepril, Benicar, HCTZ, losartan, olmesartan -Current home readings: does not check at home -Current dietary habits: limiting salt intake -Current exercise habits: no structured  exercise -Denies hypotensive/hypertensive symptoms -Educated on Exercise goal of 150 minutes per week; Importance of home blood pressure monitoring; -Counseled to monitor BP at home weekly, document, and provide log at future appointments -Recommended to continue current medication  Hyperlipidemia: (LDL goal < 100) -Uncontrolled -Current treatment: . simvastatin 20mg , 1 tablet at bedtime -Medications previously tried: none  -Current dietary patterns: patient reports diet is not as good as it should be -Current exercise habits: we discussed adding in walking (15-20 minutes) a few days per week and patient thinks she will be able to do this - [place] -Educated on Cholesterol goals;  Importance of limiting foods high in cholesterol; Exercise goal of 150 minutes per week; -Recommended to continue current medication Recommended increasing cholesterol medication  Asthma (Goal: control symptoms) -Controlled -Current treatment   montelukast 10mg , 1 tablet daily  albuterol HFA inhaler (has not needed to use over a year)  -Medications previously tried: Qvar, Dulera  -Exacerbations requiring treatment in last 6 months: none -Patient denies consistent use of maintenance inhaler -Frequency of rescue inhaler use: not often -Counseled on When to use rescue inhaler -Recommended to continue current medication  Hypothyroidism (Goal: TSH 0.35-4.5) -Uncontrolled -Current treatment  . levothyroxine 150 mcg, 1 tablet before breakfast and 1 and 1/2 tablet once weekly on Mondays -Medications previously tried: none  -Counseled on taking the medication on an empty stomach separate from all other medications  GERD (Goal: minimize symptoms) -Controlled -Current treatment  . Esomeprazole 20 mg once a week or less -Medications previously tried: none  -Counseled on non-pharmacological interventions for acid reflux. Take measures to prevent acid reflux, such as avoiding  spicy foods, avoiding caffeine, avoid laying down a few hours after eating, and raising the  head of the bed  Pain (Goal: minimize pain) -Controlled -Current treatment  . Tylenol 325 mg as needed -Medications previously tried: Tramadol (nauseous)  -Recommended to continue current medication  Overactive bladder (Goal: minimize symptoms) -Not ideally controlled -Current treatment  . Myrbetriq 25mg , 1 tablet daily -Medications previously tried: Vesicare (solifenacin), Detrol LA (tolterodine) -Recommended to continue current medication  Leg swelling (Goal: minimize swelling of lower extremities) -Controlled -Current treatment  . furosemide 40mg , 1 tablet daily as needed (3x a week) -Medications previously tried: none  -Recommended to continue current medication  Vitamin D deficiency (Goal: vitamin D 30-100) -Uncontrolled -Current treatment  . No medications -Medications previously tried: n/a  - Patient unaware she should be taking vitamin D daily. Plan to add 2000 units daily to packaging.   Health Maintenance -Vaccine gaps: none -Current therapy:  . No medications -Educated on Cost vs benefit of each product must be carefully weighed by individual consumer -Patient is satisfied with current therapy and denies issues -Recommended to continue as is  Patient Goals/Self-Care Activities . Patient will:  - take medications as prescribed focus on medication adherence by setting a timer to take your medications check blood pressure weekly, document, and provide at future appointments  Follow Up Plan: Telephone follow up appointment with care management team member scheduled for: 4 months       Patient verbalizes understanding of instructions provided today and agrees to view in [place].  Telephone follow up appointment with pharmacy team member scheduled for: 4 months  Viona Gilmore, [facility]  How to Take Your Blood Pressure Blood pressure measures how strongly your blood is  pressing against the walls of your arteries. Arteries are blood vessels that carry blood from your heart throughout your body. You can take your blood pressure at home with a machine. You may need to check your blood pressure at home:  To check if you have high blood pressure (hypertension).  To check your blood pressure over time.  To make sure your blood pressure medicine is working. Supplies needed:  Blood pressure machine, or monitor.  Dining room chair to sit in.  Table or desk.  Small notebook.  Pencil or pen. How to prepare Avoid these things for 30 minutes before checking your blood pressure:  Having drinks with caffeine in them, such as coffee or tea.  Drinking alcohol.  Eating.  Smoking.  Exercising. Do these things five minutes before checking your blood pressure:  Go to the bathroom and pee (urinate).  Sit in a dining chair. Do not sit in a soft couch or an armchair.  Be quiet. Do not talk. How to take your blood pressure Follow the instructions that came with your machine. If you have a digital blood pressure monitor, these may be the instructions: 1. Sit up straight. 2. Place your feet on the floor. Do not cross your ankles or legs. 3. Rest your left arm at the level of your heart. You may rest it on a table, desk, or chair. 4. Pull up your shirt sleeve. 5. Wrap the blood pressure cuff around the upper part of your left arm. The cuff should be 1 inch (2.5 cm) above your elbow. It is best to wrap the cuff around bare skin. 6. Fit the cuff snugly around your arm. You should be able to place only one finger between the cuff and your arm. 7. Place the cord so that it rests in the bend of your elbow. 8. Press the power button. 9.  Sit quietly while the cuff fills with air and loses air. 10. Write down the numbers on the screen. 11. Wait 2-3 minutes and then repeat steps 1-10.   What do the numbers mean? Two numbers make up your blood pressure. The first  number is called systolic pressure. The second is called diastolic pressure. An example of a blood pressure reading is "120 over 80" (or 120/80). If you are an adult and do not have a medical condition, use this guide to find out if your blood pressure is normal: Normal  First number: below 120.  Second number: below 80. Elevated  First number: 120-129.  Second number: below 80. Hypertension stage 1  First number: 130-139.  Second number: 80-89. Hypertension stage 2  First number: 140 or above.  Second number: 11 or above. Your blood pressure is above normal even if only the top or bottom number is above normal. Follow these instructions at home:  Check your blood pressure as often as your doctor tells you to.  Check your blood pressure at the same time every day.  Take your monitor to your next doctor's appointment. Your doctor will: ? Make sure you are using it correctly. ? Make sure it is working right.  Make sure you understand what your blood pressure numbers should be.  Tell your doctor if your medicine is causing side effects.  Keep all follow-up visits as told by your doctor. This is important. General tips:  You will need a blood pressure machine, or monitor. Your doctor can suggest a monitor. You can buy one at a drugstore or online. When choosing one: ? Choose one with an arm cuff. ? Choose one that wraps around your upper arm. Only one finger should fit between your arm and the cuff. ? Do not choose one that measures your blood pressure from your wrist or finger. Where to find more information American Heart Association: www.heart.org Contact a doctor if:  Your blood pressure keeps being high. Get help right away if:  Your first blood pressure number is higher than 180.  Your second blood pressure number is higher than 120. Summary  Check your blood pressure at the same time every day.  Avoid caffeine, alcohol, smoking, and exercise for 30 minutes  before checking your blood pressure.  Make sure you understand what your blood pressure numbers should be. This information is not intended to replace advice given to you by your health care provider. Make sure you discuss any questions you have with your health care provider. Document Revised: 11/29/2019 Document Reviewed: 11/29/2019 Elsevier Patient Education  2021 [employer].

## 2021-04-01 ENCOUNTER — Telehealth: Payer: Self-pay | Admitting: *Deleted

## 2021-04-01 MED ORDER — LEVOTHYROXINE SODIUM 150 MCG PO TABS: 150.0000 ug | ORAL_TABLET | Freq: Every day | ORAL | 0 refills | Status: DC

## 2021-04-01 NOTE — Telephone Encounter (Signed)
Rx done. 

## 2021-04-01 NOTE — Telephone Encounter (Signed)
-----   Message from Viona Gilmore, [facility] sent at 03/31/2021  8:27 AM EDT ----- Regarding: Levothyroxine refill Hi,  Ms. Shugars is almost out of levothyroxine, can you please send in a refill to [facility]?  Thank you, Maddie

## 2021-04-13 ENCOUNTER — Telehealth: Payer: Self-pay | Admitting: Pharmacist

## 2021-04-13 NOTE — Chronic Care Management (AMB) (Signed)
    Chronic Care Management Pharmacy Assistant   Name: [name]  MRN: [id number] DOB: [birth year]  Reason for Encounter: Medication Review - Medication Coordination Call  Recent office visits:  None  Recent consult visits:  None  Hospital visits:  None in previous 6 months  Medications: Outpatient Encounter Medications as of 04/13/2021  Medication Sig Note  . amLODipine (NORVASC) 10 MG tablet Take 1 tablet (10 mg total) by mouth daily.   . DULoxetine (CYMBALTA) 30 MG capsule TAKE ONE CAPSULE BY MOUTH BEFORE BREAKFAST   . esomeprazole (NEXIUM) 40 MG capsule Take 1 capsule (40 mg total) by mouth daily before breakfast. (Patient taking differently: Take 40 mg by mouth daily before breakfast. Takes 20mg  once daily)   . furosemide (LASIX) 40 MG tablet Patient reports taking three times a week 01/26/2016: Received from: External Pharmacy  . levothyroxine (SYNTHROID) 150 MCG tablet Take 1 tablet (150 mcg total) by mouth daily. Take 1.5 tablet once a week   . mirabegron ER (MYRBETRIQ) 25 MG TB24 tablet TAKE ONE TABLET BY MOUTH EVERYDAY AT BEDTIME   . montelukast (SINGULAIR) 10 MG tablet TAKE ONE TABLET BY MOUTH BEFORE BREAKFAST   . simvastatin (ZOCOR) 20 MG tablet TAKE ONE TABLET BY MOUTH EVERYDAY AT BEDTIME    No facility-administered encounter medications on file as of 04/13/2021.   Reviewed chart for medication changes ahead of medication coordination call. No OVs, Consults, or hospital visits since last care coordination call/Pharmacist visit.  No medication changes indicated OR if recent visit, treatment plan here.  BP Readings from Last 3 Encounters:  11/18/20 132/76  07/29/20 (!) 160/100  01/29/20 128/80    No results found for: HGBA1C   Patient obtains medications through Adherence Packaging  30 Days   Last adherence delivery included:  [name] Kitchen Duloxetine 30 mg: one tablet before breakfast . Levothyroxine 150 mcg: one and half (1.5) tablet before breakfast on Mondays and 1 tablet  before breakfast on all other days . Amlodipine 10 mg:one tablet before breakfast . Furosemide 40 mg: one tablet before breakfast Monday, Wednesday, and Friday . Montelukast 10 mg: one tablet before breakfast . Simvastatin 20 mg:one tablet at bedtime . Myrbetriq 25 mg:one tablet at bedtime . Vitamin D 2000 units: one tablet at bedtime  Patient is due for next adherence delivery on: 05.13.2022. Called patient and reviewed medications and coordinated delivery. This delivery to include: [name] Kitchen Duloxetine 30 mg: one tablet before breakfast . Levothyroxine 150 mcg: one and half (1.5) tablet before breakfast on Mondays and 1 tablet before breakfast on all other days . Amlodipine 10 mg:one tablet before breakfast . Furosemide 40 mg: one tablet before breakfast Monday, Wednesday, and Friday . Montelukast 10 mg: one tablet before breakfast . Simvastatin 20 mg:one tablet at bedtime . Myrbetriq 25 mg:one tablet at bedtime . Vitamin D 2000 units: one tablet at bedtime  Patient needs refills for: Furosemide (LASIX) 40 MG tablet   Confirmed delivery date of 04/30/2021, advised patient that pharmacy will contact them the morning of delivery.  Star Rating Drugs:  Dispensed Quantity Pharmacy  Simvastatin 20 mg 04.13.2022 30 [facility]   [name], [place] [phone]

## 2021-04-28 ENCOUNTER — Other Ambulatory Visit: Payer: Self-pay

## 2021-04-28 ENCOUNTER — Encounter: Payer: Self-pay | Admitting: Family Medicine

## 2021-04-28 ENCOUNTER — Ambulatory Visit (INDEPENDENT_AMBULATORY_CARE_PROVIDER_SITE_OTHER): Payer: PPO | Admitting: Family Medicine

## 2021-04-28 VITALS — BP 138/78 | HR 83 | Temp 98.3°F | Ht 65.0 in | Wt 225.7 lb

## 2021-04-28 DIAGNOSIS — M8949 Other hypertrophic osteoarthropathy, multiple sites: Secondary | ICD-10-CM

## 2021-04-28 DIAGNOSIS — I10 Essential (primary) hypertension: Secondary | ICD-10-CM | POA: Diagnosis not present

## 2021-04-28 DIAGNOSIS — J4520 Mild intermittent asthma, uncomplicated: Secondary | ICD-10-CM | POA: Diagnosis not present

## 2021-04-28 DIAGNOSIS — E7800 Pure hypercholesterolemia, unspecified: Secondary | ICD-10-CM | POA: Diagnosis not present

## 2021-04-28 DIAGNOSIS — N184 Chronic kidney disease, stage 4 (severe): Secondary | ICD-10-CM

## 2021-04-28 DIAGNOSIS — E039 Hypothyroidism, unspecified: Secondary | ICD-10-CM | POA: Diagnosis not present

## 2021-04-28 DIAGNOSIS — M159 Polyosteoarthritis, unspecified: Secondary | ICD-10-CM

## 2021-04-28 LAB — COMPREHENSIVE METABOLIC PANEL
ALT: 8 U/L (ref 0–35)
AST: 12 U/L (ref 0–37)
Albumin: 3.7 g/dL (ref 3.5–5.2)
Alkaline Phosphatase: 137 U/L — ABNORMAL HIGH (ref 39–117)
BUN: 21 mg/dL (ref 6–23)
CO2: 26 mEq/L (ref 19–32)
Calcium: 10.3 mg/dL (ref 8.4–10.5)
Chloride: 106 mEq/L (ref 96–112)
Creatinine, Ser: 1.52 mg/dL — ABNORMAL HIGH (ref 0.40–1.20)
GFR: 31.51 mL/min — ABNORMAL LOW (ref >60.00)
Glucose, Bld: 100 mg/dL — ABNORMAL HIGH (ref 70–99)
Potassium: 4.1 mEq/L (ref 3.5–5.1)
Sodium: 141 mEq/L (ref 135–145)
Total Bilirubin: 0.7 mg/dL (ref 0.2–1.2)
Total Protein: 5.9 g/dL — ABNORMAL LOW (ref 6.0–8.3)

## 2021-04-28 LAB — CBC WITH DIFFERENTIAL/PLATELET
Basophils Absolute: 0 10*3/uL (ref 0.0–0.1)
Basophils Relative: 0.3 % (ref 0.0–3.0)
Eosinophils Absolute: 0.2 10*3/uL (ref 0.0–0.7)
Eosinophils Relative: 4 % (ref 0.0–5.0)
HCT: 44.7 % (ref 36.0–46.0)
Hemoglobin: 14.9 g/dL (ref 12.0–15.0)
Lymphocytes Relative: 26.6 % (ref 12.0–46.0)
Lymphs Abs: 1.6 10*3/uL (ref 0.7–4.0)
MCHC: 33.4 g/dL (ref 30.0–36.0)
MCV: 89.3 fl (ref 78.0–100.0)
Monocytes Absolute: 0.5 10*3/uL (ref 0.1–1.0)
Monocytes Relative: 7.8 % (ref 3.0–12.0)
Neutro Abs: 3.6 10*3/uL (ref 1.4–7.7)
Neutrophils Relative %: 61.3 % (ref 43.0–77.0)
Platelets: 202 10*3/uL (ref 150.0–400.0)
RBC: 5.01 Mil/uL (ref 3.87–5.11)
RDW: 16.1 % — ABNORMAL HIGH (ref 11.5–15.5)
WBC: 5.9 10*3/uL (ref 4.0–10.5)

## 2021-04-28 LAB — LIPID PANEL
Cholesterol: 194 mg/dL (ref 0–200)
HDL: 60.2 mg/dL (ref >39.00)
LDL Cholesterol: 108 mg/dL — ABNORMAL HIGH (ref 0–99)
NonHDL: 133.79
Total CHOL/HDL Ratio: 3
Triglycerides: 131 mg/dL (ref 0.0–149.0)
VLDL: 26.2 mg/dL (ref 0.0–40.0)

## 2021-04-28 LAB — TSH: TSH: 4.49 u[IU]/mL (ref 0.35–4.50)

## 2021-04-28 LAB — VITAMIN D 25 HYDROXY (VIT D DEFICIENCY, FRACTURES): VITD: 16.74 ng/mL — ABNORMAL LOW (ref 30.00–100.00)

## 2021-04-28 MED ORDER — BUDESONIDE-FORMOTEROL FUMARATE 80-4.5 MCG/ACT IN AERO
2.0000 | INHALATION_SPRAY | Freq: Two times a day (BID) | RESPIRATORY_TRACT | 3 refills | Status: DC
Start: 2021-04-28 — End: 2021-10-12

## 2021-04-28 NOTE — Progress Notes (Signed)
[name] DOB: 1935/08/23 Encounter date: 04/28/2021  This is a 86 y.o. female who presents with Chief Complaint  Patient presents with  . Follow-up    History of present illness: Last visit with me was 07/29/2020.  Last blood work was 11/2020.  Hypertension: doesn't check at home. Does have monitor at home that is convenient for her.  Asthma: has some allergy sx now; runny nose - just a few days. Hasn't used inhaler in a couple years; doesn't want to have one on hand.  Vitamin D deficiency: no longer taking supplement. Not sure why she stopped.  At last visit she states she is can follow-up with new nephrologist (previous had retired): she didn't make visit yet.  Some incontinence - myrbetriq - rarely wakes up at night and just small leaking.   Hasn't been using lasix; feet swelled once at beach this summer, but otherwise has not been swelling.   Uses nexium just as needed for reflux - maybe takes a few times/month.   Kids keep eye on her. States that they don't worry about memory for her. Her mood is doing well.   No Known Allergies Current Meds  Medication Sig  . amLODipine (NORVASC) 10 MG tablet Take 1 tablet (10 mg total) by mouth daily.  . DULoxetine (CYMBALTA) 30 MG capsule TAKE ONE CAPSULE BY MOUTH BEFORE BREAKFAST  . esomeprazole (NEXIUM) 40 MG capsule Take 1 capsule (40 mg total) by mouth daily before breakfast. (Patient taking differently: Take 40 mg by mouth daily before breakfast. Takes 20mg  once daily)  . furosemide (LASIX) 40 MG tablet Patient reports taking three times a week  . levothyroxine (SYNTHROID) 137 MCG tablet Take 137 mcg by mouth daily before breakfast.  . mirabegron ER (MYRBETRIQ) 25 MG TB24 tablet TAKE ONE TABLET BY MOUTH EVERYDAY AT BEDTIME  . montelukast (SINGULAIR) 10 MG tablet TAKE ONE TABLET BY MOUTH BEFORE BREAKFAST  . simvastatin (ZOCOR) 20 MG tablet TAKE ONE TABLET BY MOUTH EVERYDAY AT BEDTIME    Review of Systems  Constitutional: Negative  for chills, fatigue and fever.  Respiratory: Negative for cough, chest tightness, shortness of breath and wheezing.   Cardiovascular: Negative for chest pain, palpitations and leg swelling.    Objective:  BP (!) 142/90 (BP Location: Left Arm, Patient Position: Sitting, Cuff Size: Large)   Pulse 83   Temp 98.3 F (36.8 C) (Oral)   Ht 5\' 5"  (1.651 m)   Wt 225 lb 11.2 oz (102.4 kg)   SpO2 93%   BMI 37.56 kg/m   Weight: 225 lb 11.2 oz (102.4 kg)   BP Readings from Last 3 Encounters:  04/28/21 (!) 142/90  11/18/20 132/76  07/29/20 (!) 160/100   Wt Readings from Last 3 Encounters:  04/28/21 225 lb 11.2 oz (102.4 kg)  11/18/20 211 lb 7 oz (95.9 kg)  07/29/20 237 lb 12.8 oz (107.9 kg)    Physical Exam Constitutional:      General: She is not in acute distress.    Appearance: She is well-developed. She is obese.  Cardiovascular:     Rate and Rhythm: Normal rate and regular rhythm.     Heart sounds: Normal heart sounds. No murmur heard. No friction rub.  Pulmonary:     Effort: Pulmonary effort is normal. No respiratory distress.     Breath sounds: Decreased breath sounds (diffuse slightly/coarse) and wheezing (insp and exp initially; cleared with coughing episode in office) present. No rales.  Musculoskeletal:     Right lower  leg: No edema.     Left lower leg: No edema.  Neurological:     Mental Status: She is alert and oriented to person, place, and time.  Psychiatric:        Behavior: Behavior normal.     Assessment/Plan  1. Essential hypertension Blood pressure is stable.  Continue amlodipine 10 mg daily, Lasix 40 mg as needed. - CBC with Differential/Platelet; Future - Comprehensive metabolic panel; Future  2. Mild intermittent asthma without complication Although patient states her breathing is fine, she does have some wheezing and cough on exam.  This is somewhat chronic for her, but I feel that she would do well with Symbicort to help keep the airway elevation  down is willing to try this 2 puffs twice a day of 80 mcg.  Continue with Singulair. I am hopeful that she will find being active easier if she is breathing easier. We discussed that she has likely just adapted to her current state of lung function.  3. Acquired hypothyroidism 137 mcg Synthroid we will recheck blood work today. - TSH; Future  4. Primary osteoarthritis involving multiple joints She is managing fairly well with arthritis currently.  She is not on any prescription medications to help with this.  5. Chronic kidney disease (CKD), stage IV (severe) ([place]) She needs to follow-up with nephrology.  She has not made appointment since her nephrologist retired.  She has the number for the clinic and states she will do this. - VITAMIN D 25 Hydroxy (Vit-D Deficiency, Fractures); Future - PTH, Intact and Calcium  6. HYPERCHOLESTEROLEMIA, PURE Continue Zocor 20 mg daily. - Lipid panel; Future  7. Hypercalcemia Blood work today.  Return in about 6 months (around 10/29/2021) for physical exam.        Micheline Rough, MD

## 2021-04-29 LAB — PTH, INTACT AND CALCIUM
Calcium: 10.3 mg/dL (ref 8.6–10.4)
PTH: 275 pg/mL — ABNORMAL HIGH (ref 16–77)

## 2021-05-21 ENCOUNTER — Telehealth: Payer: Self-pay | Admitting: Pharmacist

## 2021-05-21 NOTE — Chronic Care Management (AMB) (Addendum)
    Chronic Care Management Pharmacy Assistant   Name: [name]  MRN: [id number] DOB: [birth year]  Reason for Encounter: Medication Review/ Medication Coordination Call.   Recent office visits:  None.   Recent consult visits:  None.  Hospital visits:  None in previous 6 months  Medications: Outpatient Encounter Medications as of 05/21/2021  Medication Sig Note   amLODipine (NORVASC) 10 MG tablet Take 1 tablet (10 mg total) by mouth daily.    budesonide-formoterol (SYMBICORT) 80-4.5 MCG/ACT inhaler Inhale 2 puffs into the lungs 2 (two) times daily.    DULoxetine (CYMBALTA) 30 MG capsule TAKE ONE CAPSULE BY MOUTH BEFORE BREAKFAST    esomeprazole (NEXIUM) 40 MG capsule Take 1 capsule (40 mg total) by mouth daily before breakfast. (Patient taking differently: Take 40 mg by mouth daily before breakfast. Takes 20mg  once daily)    furosemide (LASIX) 40 MG tablet Patient reports taking three times a week 01/26/2016: Received from: External Pharmacy   levothyroxine (SYNTHROID) 137 MCG tablet Take 137 mcg by mouth daily before breakfast.    mirabegron ER (MYRBETRIQ) 25 MG TB24 tablet TAKE ONE TABLET BY MOUTH EVERYDAY AT BEDTIME    montelukast (SINGULAIR) 10 MG tablet TAKE ONE TABLET BY MOUTH BEFORE BREAKFAST    simvastatin (ZOCOR) 20 MG tablet TAKE ONE TABLET BY MOUTH EVERYDAY AT BEDTIME    No facility-administered encounter medications on file as of 05/21/2021.    Reviewed chart for medication changes ahead of medication coordination call.  No OVs, Consults, or hospital visits since last care coordination call/Pharmacist visit.  No medication changes indicated OR if recent visit, treatment plan here.  BP Readings from Last 3 Encounters:  04/28/21 138/78  11/18/20 132/76  07/29/20 (!) 160/100    No results found for: HGBA1C   Patient obtains medications through Adherence Packaging  30 Days   Last adherence delivery included:  Duloxetine 30 mg: one tablet before  breakfast Levothyroxine 150 mcg: one and half (1.5) tablet before breakfast on Mondays and 1 tablet before breakfast on all other days Amlodipine 10 mg:one tablet before breakfast Furosemide 40 mg: one tablet before breakfast Monday, Wednesday, and Friday Montelukast 10 mg: one tablet before breakfast Simvastatin 20 mg:one tablet at bedtime Myrbetriq 25 mg:one tablet at bedtime Vitamin D 2000 units: one tablet at bedtime  Patient is due for next adherence delivery on: 06/01/21. Called patient and reviewed medications and coordinated delivery.  This delivery to include: Duloxetine 30 mg: one tablet before breakfast Levothyroxine 150 mcg: one and half (1.5) tablet before breakfast on Mondays and 1 tablet before breakfast on all other days Amlodipine 10 mg:one tablet before breakfast Furosemide 40 mg: one tablet before breakfast Monday, Wednesday, and Friday Montelukast 10 mg: one tablet before breakfast Simvastatin 20 mg:one tablet at bedtime Myrbetriq 25 mg:one tablet at bedtime Vitamin D 2000 units: one tablet at bedtime  Confirmed delivery date of 06/01/21, advised patient that pharmacy will contact them the morning of delivery.  Star Rating Drugs:  Simvastatin 20mg  - last filled on 05/03/21 30DS at [facility] Pharmacist Assistant [phone]

## 2021-05-28 ENCOUNTER — Telehealth: Payer: Self-pay | Admitting: *Deleted

## 2021-05-28 ENCOUNTER — Telehealth: Payer: Self-pay | Admitting: Pharmacist

## 2021-05-28 ENCOUNTER — Telehealth: Payer: Self-pay | Admitting: Family Medicine

## 2021-05-28 DIAGNOSIS — N184 Chronic kidney disease, stage 4 (severe): Secondary | ICD-10-CM

## 2021-05-28 NOTE — Chronic Care Management (AMB) (Signed)
    Chronic Care Management Pharmacy Assistant   Name: [name]  MRN: [id number] DOB: [birth year]  Spoke with patient Per [name] request to verify patient wanting her Esomeprazole in her packs as previously put in her medication delivery with [facility] pharmacy. Patient stated that she does not want this in her packaging delivery now. Message sent to [name] the clinical pharmacist and the pharmacy.   [place]  Clinical Pharmacist Assistant [phone]

## 2021-05-28 NOTE — Telephone Encounter (Signed)
Patient informed the referral was placed (per approval from PCP in prior phone note) and someone will call with appt info.

## 2021-05-28 NOTE — Telephone Encounter (Signed)
Spoke with the pt and scheduled a follow up appt on 8/10.  The next appt in the system is for a visit with the health coach, not a lab appt.  Patient declined referral and agreed to contact Dr Bishop [place] office for an appt.

## 2021-05-28 NOTE — Telephone Encounter (Signed)
Pt is requesting a  referral to [facility]. Pt did not provide a  reason why the referral is needed [phone]. She stated that she had some problems with her kidney in the past

## 2021-05-28 NOTE — Telephone Encounter (Signed)
-----   Message from Caren Macadam, MD sent at 05/27/2021 11:41 AM EDT ----- #please set up a follow up visit for patient so that I can recheck breathing in august. Looks like she has lab visit set up next month? Not sure what this is for as I have not ordered labs and didn't request early follow up labs after last visit.  #I also want to make sure she has appointment set for nephrology. We were going to forward labs and were awaiting her response on her provider. She saw Dr. Hollie Salk at [facility] last, but she had said she may need new provider? If she needs another referral please order. If she will follow up at [facility] then we can forward most recent bloodwork to them.   Thanks! ----- Message ----- From: Viona Gilmore, [facility] Sent: 03/18/2021   4:16 PM EDT To: Caren Macadam, MD  Hi!  Just wanted to give you a heads up that I am a bit worried about Ms. Ehrich. We have had the same conversations about compliance over and over again and she doesn't recall previous times we have discussed it. She rescheduled her appt with you twice and told me she did not do that/can't remember doing that.  She is still in adherence packaging with [facility] which I think helps some but she still misses doses consistently (packaging timing is off).  Sorry to be the bearer of bad news! And this is not me giving up either, just thought you should know. Maddie

## 2021-06-09 ENCOUNTER — Ambulatory Visit (INDEPENDENT_AMBULATORY_CARE_PROVIDER_SITE_OTHER): Payer: PPO | Admitting: Internal Medicine

## 2021-06-09 ENCOUNTER — Other Ambulatory Visit: Payer: Self-pay

## 2021-06-09 ENCOUNTER — Encounter: Payer: Self-pay | Admitting: Internal Medicine

## 2021-06-09 VITALS — BP 140/80 | HR 82 | Temp 98.4°F | Wt 230.2 lb

## 2021-06-09 DIAGNOSIS — R600 Localized edema: Secondary | ICD-10-CM

## 2021-06-09 DIAGNOSIS — N1832 Chronic kidney disease, stage 3b: Secondary | ICD-10-CM

## 2021-06-09 DIAGNOSIS — I5032 Chronic diastolic (congestive) heart failure: Secondary | ICD-10-CM | POA: Diagnosis not present

## 2021-06-09 DIAGNOSIS — I10 Essential (primary) hypertension: Secondary | ICD-10-CM

## 2021-06-09 MED ORDER — METOPROLOL TARTRATE 25 MG PO TABS: 25.0000 mg | ORAL_TABLET | Freq: Two times a day (BID) | ORAL | 1 refills | Status: DC

## 2021-06-09 NOTE — Patient Instructions (Signed)
-  Nice seeing you today!!  -Increase furosemide (lasix) to 1 tablet twice daily for the next 5 days and then decrease back down to 1 tablet daily.  -Stop amlodipine.  -Start metoprolol 25 mg twice daily for your blood pressure.  -Schedule follow up with Dr. Ethlyn Gallery in 4-6 weeks.

## 2021-06-09 NOTE — Progress Notes (Signed)
Acute office Visit     This visit occurred during the SARS-CoV-2 public health emergency.  Safety protocols were in place, including screening questions prior to the visit, additional usage of staff PPE, and extensive cleaning of exam room while observing appropriate contact time as indicated for disinfecting solutions.    CC/Reason for Visit: Leg swelling  HPI: [name] is a 86 y.o. female who is coming in today for the above mentioned reasons. Past Medical History is significant for: Stage IIIb chronic kidney disease with GFR around 30, hypertension, secondary hyperparathyroidism, hypothyroidism, GERD, asthma, hyperlipidemia and depression.  She is here today because for the past 2 to 3 weeks she has noticed bilateral lower extremity edema.  She denies shortness of breath, dyspnea on exertion, chest pain, palpitations, dizziness or lightheadedness.  Upon chart review she had an echocardiogram in 2016 that showed an ejection fraction of 60 to 65% with grade 1 diastolic dysfunction.  She takes Lasix 40 mg daily.  She has been on amlodipine 10 mg daily for blood pressure.   Past Medical/Surgical History: Past Medical History:  Diagnosis Date   Asthma    no problem in last year   COLONIC POLYPS, HX OF 11/06/2009   Cough    nonproductive    DYSPNEA ON EXERTION 05/06/2008   GERD 06/18/2007   Goiter    hx of 1973 removed   HYPERCHOLESTEROLEMIA, PURE 07/09/2007   HYPERTENSION 06/18/2007   HYPOTHYROIDISM 06/18/2007   Insomnia    NEPHROLITHIASIS, HX OF 08/07/2008   Obesity    OSTEOARTHRITIS 05/07/2008   Renal cyst    RENAL INSUFFICIENCY 05/06/2008    Past Surgical History:  Procedure Laterality Date   ABDOMINAL HYSTERECTOMY     partial   CARPAL TUNNEL RELEASE Right 12/31/2019   Procedure: CARPAL TUNNEL RELEASE ENDOSCOPIC;  Surgeon: Renette Butters, MD;  Location: [place];  Service: Orthopedics;  Laterality: Right;   CATARACT EXTRACTION Bilateral    JOINT  REPLACEMENT     bilateral knees   KNEE ARTHROSCOPY Left    LITHOTRIPSY     NASAL SINUS SURGERY  1990   SINUS ENDO W/FUSION Right 11/06/2015   Procedure: ENDOSCOPIC SINUS SURGERY WITH NAVIGATION;  Surgeon: Melissa Montane, MD;  Location: [place];  Service: ENT;  Laterality: Right;  Endoscopic sinus surgery with Fusion protocol, right frontal and ethmoid sinusotomy    TONSILLECTOMY     TOTAL KNEE ARTHROPLASTY      Social History:  reports that she quit smoking about 42 years ago. Her smoking use included cigarettes. She has a 1.50 pack-year smoking history. She has never used smokeless tobacco. She reports current alcohol use. She reports that she does not use drugs.  Allergies: No Known Allergies  Family History:  Family History  Problem Relation Age of Onset   Heart Problems Father    Multiple myeloma Other    Colon cancer Other    Hypertension Neg Hx        family   Cancer Neg Hx        colon ca , prostate ca     Current Outpatient Medications:    budesonide-formoterol (SYMBICORT) 80-4.5 MCG/ACT inhaler, Inhale 2 puffs into the lungs 2 (two) times daily., Disp: 1 each, Rfl: 3   DULoxetine (CYMBALTA) 30 MG capsule, TAKE ONE CAPSULE BY MOUTH BEFORE BREAKFAST, Disp: 90 capsule, Rfl: 3   esomeprazole (NEXIUM) 40 MG capsule, Take 1 capsule (40 mg total) by mouth daily before breakfast. (  Patient taking differently: Take 40 mg by mouth daily before breakfast. Takes $RemoveBeforeDE'20mg'esEhUblBzJmikaX$  once daily), Disp: 90 capsule, Rfl: 3   furosemide (LASIX) 40 MG tablet, Patient reports taking three times a week, Disp: , Rfl: 2   levothyroxine (SYNTHROID) 137 MCG tablet, Take 137 mcg by mouth daily before breakfast., Disp: , Rfl:    metoprolol tartrate (LOPRESSOR) 25 MG tablet, Take 1 tablet (25 mg total) by mouth 2 (two) times daily., Disp: 180 tablet, Rfl: 1   mirabegron ER (MYRBETRIQ) 25 MG TB24 tablet, TAKE ONE TABLET BY MOUTH EVERYDAY AT BEDTIME, Disp: 90 tablet, Rfl: 3   montelukast (SINGULAIR) 10 MG tablet, TAKE  ONE TABLET BY MOUTH BEFORE BREAKFAST, Disp: 90 tablet, Rfl: 3   simvastatin (ZOCOR) 20 MG tablet, TAKE ONE TABLET BY MOUTH EVERYDAY AT BEDTIME, Disp: 90 tablet, Rfl: 0  Review of Systems:  Constitutional: Denies fever, chills, diaphoresis, appetite change and fatigue.  HEENT: Denies photophobia, eye pain, redness, hearing loss, ear pain, congestion, sore throat, rhinorrhea, sneezing, mouth sores, trouble swallowing, neck pain, neck stiffness and tinnitus.   Respiratory: Denies SOB, DOE, cough, chest tightness,  and wheezing.   Cardiovascular: Denies chest pain, palpitations . Gastrointestinal: Denies nausea, vomiting, abdominal pain, diarrhea, constipation, blood in stool and abdominal distention.  Genitourinary: Denies dysuria, urgency, frequency, hematuria, flank pain and difficulty urinating.  Endocrine: Denies: hot or cold intolerance, sweats, changes in hair or nails, polyuria, polydipsia. Musculoskeletal: Denies myalgias, back pain, joint swelling, arthralgias and gait problem.  Skin: Denies pallor, rash and wound.  Neurological: Denies dizziness, seizures, syncope, weakness, light-headedness, numbness and headaches.  Hematological: Denies adenopathy. Easy bruising, personal or family bleeding history  Psychiatric/Behavioral: Denies suicidal ideation, mood changes, confusion, nervousness, sleep disturbance and agitation    Physical Exam: Vitals:   06/09/21 1008  BP: 140/80  Pulse: 82  Temp: 98.4 F (36.9 C)  TempSrc: Oral  SpO2: 95%  Weight: 230 lb 3.2 oz (104.4 kg)    Body mass index is 38.31 kg/m.   Constitutional: NAD, calm, comfortable, obese Eyes: PERRL, lids and conjunctivae normal ENMT: Mucous membranes are moist.  Respiratory: clear to auscultation bilaterally, no wheezing, no crackles. Normal respiratory effort. No accessory muscle use.  Cardiovascular: Regular rate and rhythm, no murmurs / rubs / gallops.  2-3+ pitting lower extremity edema from the dorsum of  the foot all the way up to mid shins. 2+ pedal pulses.  Psychiatric: Normal judgment and insight. Alert and oriented x 3. Normal mood.    Impression and Plan:  Bilateral leg edema Chronic heart failure with preserved ejection fraction (HCC) Essential hypertension  Stage 3b chronic kidney disease ([place])  -Recent labs confirm stage IIIb chronic kidney disease and it appears referral to nephrology is in process. -She certainly has heart failure with preserved ejection fraction based off an echo in 2016, suspect this is contributing. -She has been on amlodipine which can also contribute to peripheral edema. -Today I have elected to discontinue amlodipine, start instead on metoprolol 25 mg twice daily both for blood pressure management and for heart remodeling.  ACE inhibitor's/ARB's would be contraindicated given her advanced CKD. -I have asked her to increase her furosemide to 40 mg twice a day for the next 5 days and then to back down to once daily. -She will schedule follow-up with Dr. Ethlyn Gallery in around 4 to 6 weeks for continued management.  Time spent: 33 minutes reviewing chart, interviewing and examining patient and formulating plan of care.   Patient Instructions  -  Nice seeing you today!!  -Increase furosemide (lasix) to 1 tablet twice daily for the next 5 days and then decrease back down to 1 tablet daily.  -Stop amlodipine.  -Start metoprolol 25 mg twice daily for your blood pressure.  -Schedule follow up with Dr. Ethlyn Gallery in 4-6 weeks.    Lelon Frohlich, MD [facility]

## 2021-06-18 ENCOUNTER — Other Ambulatory Visit: Payer: Self-pay | Admitting: Family Medicine

## 2021-06-22 ENCOUNTER — Telehealth: Payer: Self-pay | Admitting: Pharmacist

## 2021-06-22 NOTE — Chronic Care Management (AMB) (Signed)
    Chronic Care Management Pharmacy Assistant   Name: [name]  MRN: [id number] DOB: 1935/10/26  Reason for Encounter: Medication Review-Medication Coordination Call  Recent office visits:  06.22.2022 Isaac Bliss, Rayford Halsted, MD patient seen for follow-up about bilateral leg swelling. Medication Changes: Metoprolol Tartrate 25 mg twice daily( started) and Amlodipine 10 mg discontinued.  Recent consult visits:  None  Hospital visits:  None in previous 6 months  Medications: Outpatient Encounter Medications as of 06/22/2021  Medication Sig Note   budesonide-formoterol (SYMBICORT) 80-4.5 MCG/ACT inhaler Inhale 2 puffs into the lungs 2 (two) times daily.    DULoxetine (CYMBALTA) 30 MG capsule TAKE ONE CAPSULE BY MOUTH BEFORE BREAKFAST    esomeprazole (NEXIUM) 40 MG capsule Take 1 capsule (40 mg total) by mouth daily before breakfast. (Patient taking differently: Take 40 mg by mouth daily before breakfast. Takes 20mg  once daily)    furosemide (LASIX) 40 MG tablet Patient reports taking three times a week 01/26/2016: Received from: External Pharmacy   levothyroxine (SYNTHROID) 137 MCG tablet Take 137 mcg by mouth daily before breakfast.    metoprolol tartrate (LOPRESSOR) 25 MG tablet Take 1 tablet (25 mg total) by mouth 2 (two) times daily.    mirabegron ER (MYRBETRIQ) 25 MG TB24 tablet TAKE ONE TABLET BY MOUTH EVERYDAY AT BEDTIME    montelukast (SINGULAIR) 10 MG tablet TAKE ONE TABLET BY MOUTH BEFORE BREAKFAST    simvastatin (ZOCOR) 20 MG tablet TAKE ONE TABLET BY MOUTH EVERYDAY AT BEDTIME    No facility-administered encounter medications on file as of 06/22/2021.   Reviewed chart for medication changes ahead of medication coordination call.  BP Readings from Last 3 Encounters:  06/09/21 140/80  04/28/21 138/78  11/18/20 132/76    No results found for: HGBA1C   Patient obtains medications through Adherence Packaging  30 Days  Last adherence delivery included:  Duloxetine 30 mg:  one tablet before breakfast Levothyroxine 150 mcg: one and half (1.5) tablet before breakfast on Mondays and 1 tablet before breakfast on all other days Amlodipine 10 mg: one tablet before breakfast Furosemide 40 mg: one tablet before breakfast Monday, Wednesday, and Friday Montelukast 10 mg: one tablet before breakfast Simvastatin 20 mg: one tablet at bedtime Myrbetriq 25 mg: one tablet at bedtime Vitamin D 2000 units: one tablet at bedtime  Patient is due for next adherence delivery on: 07.14.2022. Called patient and reviewed medications and coordinated delivery. This delivery to include: Duloxetine 30 mg: one tablet before breakfast Levothyroxine 150 mcg: one and half (1.5) tablet before breakfast on Mondays and 1 tablet before breakfast on all other days Furosemide 40 mg: one tablet before breakfast Monday, Wednesday, and Friday Montelukast 10 mg: one tablet before breakfast Simvastatin 20 mg: one tablet at bedtime Myrbetriq 25 mg: one tablet at bedtime Vitamin D 2000 units: one tablet at bedtime Metoprolol Tartrate 25: one tablet in the morning and one at bedtime  Patient does not needs refills for her medication.  Confirmed delivery date of 07.14.2022, advised patient that pharmacy will contact them the morning of delivery.  Star Rating Drugs: Medication Dispensed Quantity Pharmacy  Simvastatin 20 mg   06.09.2022 30 [facility]   [name], [place] Pharmacist Assistant [phone]

## 2021-07-14 ENCOUNTER — Telehealth: Payer: Self-pay | Admitting: Pharmacist

## 2021-07-14 DIAGNOSIS — E210 Primary hyperparathyroidism: Secondary | ICD-10-CM | POA: Diagnosis not present

## 2021-07-14 NOTE — Chronic Care Management (AMB) (Signed)
    Chronic Care Management Pharmacy Assistant   Name: ZELL DOUCETTE  MRN: [id number] DOB: [birth year]  07/14/21-  Called patient to remind of appointment with [name]) on (07/15/21 at 10:30am via telephone.)  Patient needs to reschedule her upcoming appointment due to a hair appointment at the same time tomorrow. I rescheduled patient to 07/27/21 at 3pm via telephone.  Patient aware of appointment date, time, and type of appointment (either telephone or in person). Patient aware to have/bring all medications, supplements, blood pressure and/or blood sugar logs to visit.  Questions: Have you had any recent office visit or specialist visit outside of [place]? No.  Are there any concerns you would like to discuss during your office visit? Are you having any problems obtaining your medications? (Whether it pharmacy issues or cost) If patient has any PAP medications ask if they are having any problems getting their PAP medication or refill?  Care Gaps:  AWV -  scheduled for 11/19/21. Covid-19 vaccine - booster 4 overdue since 02/09/21.   Star Rating Drug:  Simvastatin 20mg  - last filled on 07/01/21 30DS at [facility]  Any gaps in medications fill history?  Yes. Patient will be getting her Monthly dispensing call and form completed at appointment on 07/15/21 with [name] the clinical pharmacist. Patient is on no fill report for her amlodipine due to not receiving in last medication delivery. Amlodipine was discontinued at a recent office visit with PCP on 06/09/21.  [place]  Clinical Pharmacist Assistant [phone]

## 2021-07-15 ENCOUNTER — Telehealth: Payer: PPO

## 2021-07-16 ENCOUNTER — Telehealth: Payer: PPO

## 2021-07-26 ENCOUNTER — Telehealth: Payer: Self-pay | Admitting: Pharmacist

## 2021-07-26 NOTE — Chronic Care Management (AMB) (Signed)
    Chronic Care Management Pharmacy Assistant   Name: [name]  MRN: [id number] DOB: 06-05-1935  07/26/21-  Called patient to remind of appointment with [name]) on (07/27/21 at 3pm via telephone.)  Patient aware of appointment date, time, and type of appointment (either telephone or in person). Patient aware to have/bring all medications, supplements, blood pressure and/or blood sugar logs to visit.  Questions: Have you had any recent office visit or specialist visit outside of [place]? No.  Are there any concerns you would like to discuss during your office visit? None at this time. Are you having any problems obtaining your medications? (Whether it pharmacy issues or cost) no issues at this time.  If patient has any PAP medications ask if they are having any problems getting their PAP medication or refill? No patient assistance at this time.   Care Gaps:  AWV - scheduled for 11/19/21 Covid - 19 vaccine booster 4 -  overdue since 02/09/21 Influenza vaccine -  due since 07/19/21  Star Rating Drug:  Simvastatin 20mg  - last filled on 06/24/21 30DS at [facility].  Any gaps in medications fill history? No.  [place]  Clinical Pharmacist Assistant [phone]

## 2021-07-27 ENCOUNTER — Other Ambulatory Visit: Payer: Self-pay

## 2021-07-27 ENCOUNTER — Ambulatory Visit (INDEPENDENT_AMBULATORY_CARE_PROVIDER_SITE_OTHER): Payer: PPO | Admitting: Pharmacist

## 2021-07-27 DIAGNOSIS — I10 Essential (primary) hypertension: Secondary | ICD-10-CM | POA: Diagnosis not present

## 2021-07-27 DIAGNOSIS — E039 Hypothyroidism, unspecified: Secondary | ICD-10-CM

## 2021-07-27 NOTE — Progress Notes (Signed)
Chronic Care Management Pharmacy Note  08/13/2021 Name:  [name] MRN:  [id number] DOB:  05/26/1935  Summary: Pt is not adherent with medications and frequently misses doses  Recommendations/Changes made from today's visit: -Adjusted fill dates for adherence packaging and encouraged compliance -Recommended monitoring of BP at home  Plan: Follow up BP assessment in 2-3 months  Subjective: [name] is an 86 y.o. year old female who is a primary patient of Koberlein, Steele Berg, MD.  The CCM team was consulted for assistance with disease management and care coordination needs.    Engaged with patient by telephone for follow up visit in response to provider referral for pharmacy case management and/or care coordination services.   Consent to Services:  The patient was given information about Chronic Care Management services, agreed to services, and gave verbal consent prior to initiation of services.  Please see initial visit note for detailed documentation.   Patient Care Team: Caren Macadam, MD as PCP - General (Family Medicine) Jamal Maes, MD as Consulting Physician (Nephrology) Viona Gilmore, [facility] as Pharmacist (Pharmacist)  Recent office visits: 6/22.22 Isaac Bliss, Rayford Halsted, MD: patient seen for follow-up about bilateral leg swelling. Prescribed metoprolol tartrate 25 mg twice daily and d/c'd amlodipine 10 mg.  04/28/21 Micheline Rough, MD: Patient presented for chronic conditions follow up. Prescribed Symbicort. Decreased levothyroxine to 137 mcg daily.  11/18/20 Ofilia Neas, LPN: Patient presented for medicare annual wellness visit.  Recent consult visits: 07/14/21 Charlotta Newton, MD (: Patient presented for primary hyperparathryoidism follow up. Discussed parathyroid surgery.  02/23/21 Rutherford Guys, MD (optometry): Patient presented for annual eye exam.  04/14/20 Danella Sensing (dermatology): Patient presented for epidermal cyst follow up. Unable to  access notes.  Hospital visits: None in previous 6 months  Objective:  Lab Results  Component Value Date   CREATININE 1.52 (H) 04/28/2021   BUN 21 04/28/2021   GFR 31.51 (L) 04/28/2021   GFRNONAA 27 (L) 11/06/2015   GFRAA 32 (L) 11/06/2015   NA 141 04/28/2021   K 4.1 04/28/2021   CALCIUM 10.3 04/28/2021   CALCIUM 10.3 04/28/2021   CO2 26 04/28/2021   GLUCOSE 100 (H) 04/28/2021    Lab Results  Component Value Date/Time   GFR 31.51 (L) 04/28/2021 01:41 PM   GFR 35.96 (L) 03/04/2020 02:17 PM    Last diabetic Eye exam: No results found for: HMDIABEYEEXA  Last diabetic Foot exam: No results found for: HMDIABFOOTEX   Lab Results  Component Value Date   CHOL 194 04/28/2021   HDL 60.20 04/28/2021   LDLCALC 108 (H) 04/28/2021   LDLDIRECT 174.2 07/09/2007   TRIG 131.0 04/28/2021   CHOLHDL 3 04/28/2021    Hepatic Function Latest Ref Rng & Units 04/28/2021 11/25/2020 07/29/2020  Total Protein 6.0 - 8.3 g/dL 5.9(L) 6.1 5.9(L)  Albumin 3.5 - 5.2 g/dL 3.7 - -  AST 0 - 37 U/L $Remo'12 12 13  'pvUvp$ ALT 0 - 35 U/L $Remo'8 6 9  'itypp$ Alk Phosphatase 39 - 117 U/L 137(H) - -  Total Bilirubin 0.2 - 1.2 mg/dL 0.7 0.8 0.6  Bilirubin, Direct 0.0 - 0.3 mg/dL - - -    Lab Results  Component Value Date/Time   TSH 4.49 04/28/2021 01:41 PM   TSH 5.68 (H) 11/25/2020 11:33 AM    CBC Latest Ref Rng & Units 04/28/2021 07/29/2020 03/04/2020  WBC 4.0 - 10.5 K/uL 5.9 5.4 6.5  Hemoglobin 12.0 - 15.0 g/dL 14.9 14.2 13.5  Hematocrit 36.0 -  46.0 % 44.7 43.4 41.3  Platelets 150.0 - 400.0 K/uL 202.0 226 221.0    Lab Results  Component Value Date/Time   VD25OH 16.74 (L) 04/28/2021 01:41 PM   VD25OH 22 (L) 07/29/2020 11:52 AM   VD25OH 15.35 (L) 01/29/2020 12:28 PM    Clinical ASCVD: No  The ASCVD Risk score Denman George DC Jr., et al., 2013) failed to calculate for the following reasons:   The 2013 ASCVD risk score is only valid for ages 3 to 49    Depression screen PHQ 2/9 11/18/2020 07/29/2020 02/01/2018  Decreased  Interest 0 0 0  Down, Depressed, Hopeless 0 0 0  PHQ - 2 Score 0 0 0  Altered sleeping 0 - -  Tired, decreased energy 0 - -  Change in appetite 0 - -  Feeling bad or failure about yourself  0 - -  Trouble concentrating 0 - -  Moving slowly or fidgety/restless 0 - -  Suicidal thoughts 0 - -  PHQ-9 Score 0 - -  Difficult doing work/chores Not difficult at all - -      Social History   Tobacco Use  Smoking Status Former   Packs/day: 0.25   Years: 6.00   Pack years: 1.50   Types: Cigarettes   Quit date: 12/19/1978   Years since quitting: 42.6  Smokeless Tobacco Never   BP Readings from Last 3 Encounters:  07/28/21 (!) 132/52  06/09/21 140/80  04/28/21 138/78   Pulse Readings from Last 3 Encounters:  07/28/21 77  06/09/21 82  04/28/21 83   Wt Readings from Last 3 Encounters:  07/28/21 232 lb 6.4 oz (105.4 kg)  06/09/21 230 lb 3.2 oz (104.4 kg)  04/28/21 225 lb 11.2 oz (102.4 kg)   BMI Readings from Last 3 Encounters:  07/28/21 38.67 kg/m  06/09/21 38.31 kg/m  04/28/21 37.56 kg/m    Assessment/Interventions: Review of patient past medical history, allergies, medications, health status, including review of consultants reports, laboratory and other test data, was performed as part of comprehensive evaluation and provision of chronic care management services.   SDOH:  (Social Determinants of Health) assessments and interventions performed: No  SDOH Screenings   Alcohol Screen: Low Risk    Last Alcohol Screening Score (AUDIT): 1  Depression (PHQ2-9): Low Risk    PHQ-2 Score: 0  Financial Resource Strain: Low Risk    Difficulty of Paying Living Expenses: Not hard at all  Food Insecurity: No Food Insecurity   Worried About Programme researcher, broadcasting/film/video in the Last Year: Never true   Ran Out of Food in the Last Year: Never true  Housing: Low Risk    Last Housing Risk Score: 0  Physical Activity: Inactive   Days of Exercise per Week: 0 days   Minutes of Exercise per  Session: 0 min  Social Connections: Moderately Isolated   Frequency of Communication with Friends and Family: More than three times a week   Frequency of Social Gatherings with Friends and Family: More than three times a week   Attends Religious Services: Never   Database administrator or Organizations: Yes   Attends Banker Meetings: 1 to 4 times per year   Marital Status: Widowed  Stress: No Stress Concern Present   Feeling of Stress : Not at all  Tobacco Use: Medium Risk   Smoking Tobacco Use: Former   Smokeless Tobacco Use: Never  Transportation Needs: No Regulatory affairs officer (Medical): No  Lack of Transportation (Non-Medical): No    CCM Care Plan  No Known Allergies  Medications Reviewed Today     Reviewed by Agnes Lawrence, CMA (Certified Medical Assistant) on 07/28/21 at 1440  Med List Status: <None>   Medication Order Taking? Sig Documenting Provider Last Dose Status Informant  budesonide-formoterol (SYMBICORT) 80-4.5 MCG/ACT inhaler [id number] Yes Inhale 2 puffs into the lungs 2 (two) times daily. Caren Macadam, MD Taking Active   Cholecalciferol (VITAMIN D3 PO) [id number] Yes Take 2,000 Units by mouth daily. [provider] Taking Active   DULoxetine (CYMBALTA) 30 MG capsule [id number] Yes TAKE ONE CAPSULE BY MOUTH BEFORE BREAKFAST Koberlein, Junell C, MD Taking Active   furosemide (LASIX) 40 MG tablet [id number] Yes Patient reports taking three times a week [provider] Taking Active            Med Note Titus Dubin, Pamalee Leyden   Tue Jan 26, 2016  2:54 PM) Received from: External Pharmacy  levothyroxine (SYNTHROID) 150 MCG tablet [id number] Yes Take 150 mcg by mouth daily before breakfast. [provider] Taking Active   metoprolol tartrate (LOPRESSOR) 25 MG tablet [id number] Yes Take 1 tablet (25 mg total) by mouth 2 (two) times daily. Isaac Bliss, Rayford Halsted, MD Taking Active   mirabegron ER (MYRBETRIQ)  25 MG TB24 tablet [id number] Yes TAKE ONE TABLET BY MOUTH EVERYDAY AT BEDTIME Koberlein, Junell C, MD Taking Active   montelukast (SINGULAIR) 10 MG tablet [id number] Yes TAKE ONE TABLET BY MOUTH BEFORE BREAKFAST Koberlein, Junell C, MD Taking Active   simvastatin (ZOCOR) 20 MG tablet [id number] Yes TAKE ONE TABLET BY MOUTH EVERYDAY AT BEDTIME Caren Macadam, MD Taking Active             Patient Active Problem List   Diagnosis Date Noted   Chronic kidney disease (CKD), stage IV (severe) ([place]) 02/01/2021   Sinusitis chronic, frontal 11/06/2015   Cough variant asthma 09/23/2015   Chronic cough 04/09/2015   Acute renal failure superimposed on stage 4 chronic kidney disease ([place]) 04/09/2015   Obesity 11/22/2011   History of colonic polyps 11/06/2009   NEPHROLITHIASIS, HX OF 08/07/2008   Osteoarthritis 05/07/2008   Disorder resulting from impaired renal function 05/06/2008   DOE (dyspnea on exertion) 05/06/2008   HYPERCHOLESTEROLEMIA, PURE 07/09/2007   Hypothyroidism 06/18/2007   Essential hypertension 06/18/2007   Asthma 06/18/2007   GERD 06/18/2007    Immunization History  Administered Date(s) Administered   Fluad Quad(high Dose 65+) 09/26/2019, 11/18/2020   Influenza Split 09/20/2011, 09/12/2012   Influenza Whole 10/19/2006, 10/18/2007, 09/19/2008, 10/02/2010   Influenza, High Dose Seasonal PF 08/21/2015, 09/08/2016, 09/05/2017, 10/12/2018   Influenza,inj,Quad PF,6+ Mos 10/04/2013, 09/12/2014   Influenza-Unspecified 08/20/2019   PFIZER(Purple Top)SARS-COV-2 Vaccination 01/07/2020, 01/27/2020, 10/09/2020   Pneumococcal Conjugate-13 07/01/2014   Pneumococcal Polysaccharide-23 09/18/2004   Tdap 11/22/2011   Zoster Recombinat (Shingrix) 09/26/2019, 12/17/2019   Zoster, Live 05/06/2008    Conditions to be addressed/monitored:  Hypertension, Hyperlipidemia, GERD, Asthma, Chronic Kidney Disease, Hypothyroidism, Osteoarthritis and Overactive Bladder  Care Plan : [place]  Updates made by Viona Gilmore, [place] since 08/13/2021 12:00 AM     Problem: Problem: Hypertension, Hyperlipidemia, GERD, Asthma, Chronic Kidney Disease, Hypothyroidism, Osteoarthritis and Overactive Bladder      Long-Range Goal: Patient-Specific Goal   Start Date: 03/12/2021  Expected End Date: 03/12/2022  Recent Progress: On track  Priority: High  Note:   Current Barriers:  Unable to independently monitor therapeutic efficacy Unable to self  administer medications as prescribed Does not maintain contact with provider office  Pharmacist Clinical Goal(s):  Patient will achieve control of hypothyroidism as evidenced by TSH achieve ability to self administer medications as prescribed through use of use of a timer as evidenced by patient report through collaboration with PharmD and provider.   Interventions: 1:1 collaboration with Caren Macadam, MD regarding development and update of comprehensive plan of care as evidenced by provider attestation and co-signature Inter-disciplinary care team collaboration (see longitudinal plan of care) Comprehensive medication review performed; medication list updated in electronic medical record  Hypertension (BP goal <140/90) -Controlled -Current treatment: amlodipine 10mg , 1 tablet once daily  -Medications previously tried: benazepril, Benicar, HCTZ, losartan, olmesartan -Current home readings: does not check at home -Current dietary habits: limiting salt intake -Current exercise habits: no structured exercise -Denies hypotensive/hypertensive symptoms -Educated on Exercise goal of 150 minutes per week; Importance of home blood pressure monitoring; -Counseled to monitor BP at home weekly, document, and provide log at future appointments -Recommended to continue current medication  Hyperlipidemia: (LDL goal < 100) -Uncontrolled -Current treatment: simvastatin 20mg , 1 tablet at bedtime -Medications previously tried: none   -Current dietary patterns: patient reports diet is not as good as it should be -Current exercise habits: we discussed adding in walking (15-20 minutes) a few days per week and patient thinks she will be able to do this - she [place] a walkable neighborhood -Educated on Cholesterol goals;  Importance of limiting foods high in cholesterol; Exercise goal of 150 minutes per week; -Recommended to continue current medication Recommended increasing cholesterol medication  Asthma (Goal: control symptoms) -Controlled -Current treatment  montelukast 10mg , 1 tablet daily albuterol HFA inhaler (has not needed to use over a year)  -Medications previously tried: Qvar, Dulera  -Exacerbations requiring treatment in last 6 months: none -Patient denies consistent use of maintenance inhaler -Frequency of rescue inhaler use: not often -Counseled on When to use rescue inhaler -Recommended to continue current medication   Hypothyroidism (Goal: TSH 0.35-4.5) -Controlled -Current treatment  levothyroxine 150 mcg, 1 tablet before breakfast and 1 and 1/2 tablet once weekly on Mondays -Medications previously tried: none  -Counseled on taking the medication on an empty stomach separate from all other medications  GERD (Goal: minimize symptoms) -Controlled -Current treatment  Esomeprazole 20 mg once a week or less -Medications previously tried: none  -Counseled on non-pharmacological interventions for acid reflux. Take measures to prevent acid reflux, such as avoiding spicy foods, avoiding caffeine, avoid laying down a few hours after eating, and raising the head of the bed  Pain (Goal: minimize pain) -Controlled -Current treatment  Tylenol 325 mg as needed -Medications previously tried: Tramadol (nauseous)  -Recommended to continue current medication  Overactive bladder (Goal: minimize symptoms) -Not ideally controlled -Current treatment  Myrbetriq 25mg , 1 tablet daily -Medications previously  tried: Vesicare (solifenacin), Detrol LA (tolterodine) -Recommended to continue current medication  Leg swelling (Goal: minimize swelling of lower extremities) -Controlled -Current treatment  furosemide 40mg , 1 tablet daily as needed (3x a week) -Medications previously tried: none  -Recommended to continue current medication  Vitamin D deficiency (Goal: vitamin D 30-100) -Uncontrolled -Current treatment  Vitamin D 2000 units daily -Medications previously tried: n/a  -Recommended to continue current medication   Health Maintenance -Vaccine gaps: none -Current therapy:  No medications -Educated on Cost vs benefit of each product must be carefully weighed by individual consumer -Patient is satisfied with current therapy and denies issues -Recommended to continue as is  Patient  Goals/Self-Care Activities Patient will:  - take medications as prescribed focus on medication adherence by setting a timer to take your medications check blood pressure weekly, document, and provide at future appointments  Follow Up Plan: Telephone follow up appointment with care management team member scheduled for: 6 months      Hasn't started the symbicort - didn't have an issue - will start - not super expensive  Daughter in law - son lives in [place]  Patient not checking BP at home -   Medication Assistance: None required.  Patient affirms current coverage meets needs.  Care Gaps: COVID booster, influenza   Star Rating Drug:   Simvastatin $RemoveBefo'20mg'WJDeLbvNLJr$  - last filled on 06/24/21 30DS at [facility]  Patient's preferred pharmacy is:  [facility] [address] [place] - [address] [place] [address] Phone: [phone] Fax: [phone]  [facility] [place], [place] [address] Phone: [phone] Fax: [phone]  [id number] - [place], [place] - [address] [place] [address] [place] [address] Phone: [phone] Fax: [phone]  [facility] - [place], [place] - [address] [place] [address] Phone: [phone] Fax: [phone]  Uses pill box? No - adherence packaging Pt endorses 80% compliance  We discussed: Benefits of medication synchronization, packaging and delivery as well as enhanced pharmacist oversight with [facility]. Patient decided to: Utilize [facility] pharmacy for medication synchronization, packaging and delivery  Care Plan and Follow Up Patient Decision:  Patient agrees to Care Plan and Follow-up.  Plan: Telephone follow up appointment with care management team member scheduled for:  4 months  [name], PharmD [place] Pharmacist [place] [facility] [place] [phone]

## 2021-07-28 ENCOUNTER — Ambulatory Visit (INDEPENDENT_AMBULATORY_CARE_PROVIDER_SITE_OTHER): Payer: PPO | Admitting: Family Medicine

## 2021-07-28 ENCOUNTER — Encounter: Payer: Self-pay | Admitting: Family Medicine

## 2021-07-28 VITALS — BP 132/52 | HR 77 | Temp 98.0°F | Ht 65.0 in | Wt 232.4 lb

## 2021-07-28 DIAGNOSIS — I10 Essential (primary) hypertension: Secondary | ICD-10-CM

## 2021-07-28 DIAGNOSIS — E039 Hypothyroidism, unspecified: Secondary | ICD-10-CM | POA: Diagnosis not present

## 2021-07-28 DIAGNOSIS — J4520 Mild intermittent asthma, uncomplicated: Secondary | ICD-10-CM

## 2021-07-28 DIAGNOSIS — N184 Chronic kidney disease, stage 4 (severe): Secondary | ICD-10-CM

## 2021-07-28 DIAGNOSIS — R32 Unspecified urinary incontinence: Secondary | ICD-10-CM

## 2021-07-28 DIAGNOSIS — R413 Other amnesia: Secondary | ICD-10-CM | POA: Diagnosis not present

## 2021-07-28 MED ORDER — MIRABEGRON ER 50 MG PO TB24: 50.0000 mg | ORAL_TABLET | Freq: Every day | ORAL | 3 refills | Status: DC

## 2021-07-28 NOTE — Patient Instructions (Signed)
Check blood pressures at home and write down pressure/pulse. If you are regularly getting pressures below 110/65 (either number) let me know.   Stay well hydrated.   If any concerns with memory or if your kids have concerns with memory let me know.

## 2021-07-28 NOTE — Progress Notes (Signed)
[name] DOB: 1935/06/29 Encounter date: 07/28/2021  This is a 86 y.o. female who presents with Chief Complaint  Patient presents with   Follow-up    History of present illness: Doing well; feeling fine.   *has been staying out of the heat.   *breathing has been doing well. Not using inhaler; doesn't feel need. Not getting regular exercise.   HTN: doesn't check at home. Does have cuff. Not felt light headed/dizzy. No headaches.   Incontinence: states that she is not doing well with this. In morning when she gets up she dribbles until she gets to toilet. Not ready for procedure. Using myrbetriq. Does feel like this helps.   Allergies haven't bothered her.   She stays inside - doesn't do yard work. She does eat out quite a lot; cooks some at home. Does have help coming in to care for house- 2 days a week to keep her company. Doesn't vacuum (maid does that on Friday).   She did end up seeing renal specialist.    No Known Allergies Current Meds  Medication Sig   budesonide-formoterol (SYMBICORT) 80-4.5 MCG/ACT inhaler Inhale 2 puffs into the lungs 2 (two) times daily.   Cholecalciferol (VITAMIN D3 PO) Take 2,000 Units by mouth daily.   DULoxetine (CYMBALTA) 30 MG capsule TAKE ONE CAPSULE BY MOUTH BEFORE BREAKFAST   furosemide (LASIX) 40 MG tablet Patient reports taking three times a week   levothyroxine (SYNTHROID) 150 MCG tablet Take 150 mcg by mouth daily before breakfast.   metoprolol tartrate (LOPRESSOR) 25 MG tablet Take 1 tablet (25 mg total) by mouth 2 (two) times daily.   mirabegron ER (MYRBETRIQ) 25 MG TB24 tablet TAKE ONE TABLET BY MOUTH EVERYDAY AT BEDTIME   montelukast (SINGULAIR) 10 MG tablet TAKE ONE TABLET BY MOUTH BEFORE BREAKFAST   simvastatin (ZOCOR) 20 MG tablet TAKE ONE TABLET BY MOUTH EVERYDAY AT BEDTIME    Review of Systems  Constitutional:  Negative for chills, fatigue and fever.  Respiratory:  Negative for cough, chest tightness, shortness of breath  and wheezing.   Cardiovascular:  Negative for chest pain, palpitations and leg swelling.  Psychiatric/Behavioral:  Negative for sleep disturbance. The patient is not nervous/anxious.    Objective:  BP (!) 132/52 (BP Location: Left Arm, Patient Position: Sitting, Cuff Size: Large)   Pulse 77   Temp 98 F (36.7 C) (Oral)   Ht 5\' 5"  (1.651 m)   Wt 232 lb 6.4 oz (105.4 kg)   SpO2 93%   BMI 38.67 kg/m   Weight: 232 lb 6.4 oz (105.4 kg)   BP Readings from Last 3 Encounters:  07/28/21 (!) 132/52  06/09/21 140/80  04/28/21 138/78   Wt Readings from Last 3 Encounters:  07/28/21 232 lb 6.4 oz (105.4 kg)  06/09/21 230 lb 3.2 oz (104.4 kg)  04/28/21 225 lb 11.2 oz (102.4 kg)    Physical Exam Constitutional:      General: She is not in acute distress.    Appearance: She is well-developed.  Cardiovascular:     Rate and Rhythm: Normal rate and regular rhythm.     Heart sounds: Normal heart sounds. No murmur heard.   No friction rub.  Pulmonary:     Effort: Pulmonary effort is normal. No respiratory distress.     Breath sounds: Normal breath sounds. No wheezing or rales.  Musculoskeletal:     Right lower leg: No edema.     Left lower leg: No edema.  Neurological:  Mental Status: She is alert and oriented to person, place, and time.  Psychiatric:        Behavior: Behavior normal.   Montreal Cognitive Assessment  07/30/2021  Visuospatial/ Executive (0/5) 3  Naming (0/3) 2  Attention: Read list of digits (0/2) 2  Attention: Read list of letters (0/1) 1  Attention: Serial 7 subtraction starting at 100 (0/3) 3  Language: Repeat phrase (0/2) 2  Language : Fluency (0/1) 1  Abstraction (0/2) 1  Delayed Recall (0/5) 0  Orientation (0/6) 6  Total 21  Adjusted Score (based on education) 21    Assessment/Plan  1. Essential hypertension Blood pressure on lower end today.  Advised her to monitor at home as we may need to adjust medications.  She has been [facility] and has had some compliance issues with medications in the past (per dispensing report, not per patient report).  She stated that she feels these packs are helpful for staying on track of taking medications, but she frequently gets off cycle with medications to help.  I suspect she is taking medications more regularly with posterior packing unless blood pressure is lower.  2. Acquired hypothyroidism Continue with synthroid 185mcg daily.  3. Mild intermittent asthma without complication Breathing has been stable.  Continue inhalers.  4. Memory impairment We discussed slight memory impairment on testing today.  Patient does not feel that memory is an issue for her and states that she feels functioning is fine, family feels functioning is fine.  We discussed that there are some medications to help with delay of memory loss, but at this time she does not wish to pursue any further treatment or evaluation.  Would suggest we repeat mocog in 1 year or sooner if change in status.   5.  Chronic kidney disease: She is following with nephrology.  I do not have most recent notes available so we will request this today.  [facility].  6.  Urinary incontinence: Still some leaking, especially when getting up in the Increase Myrbetriq to 50 mg daily.  Return in about 6 months (around 01/28/2022) for physical exam. 42 minutes spent in total time with patient, chart review, exam, charting time.  Patient also discussed with pharmacist due to concerns with compliance of medication.  They will have follow-up visit shortly can review blood pressures at that time.   Micheline Rough, MD

## 2021-08-12 ENCOUNTER — Telehealth: Payer: Self-pay | Admitting: Pharmacist

## 2021-08-12 NOTE — Chronic Care Management (AMB) (Signed)
    Chronic Care Management Pharmacy Assistant   Name: [name]  MRN: [id number] DOB: 05/04/1935  Reason for Encounter: Medication Review/ Medication Coordination Call.    Recent office visits:  07/28/21 Sherri Rough MD (PCP) -  seen for hypertension and other issues. Increased levothyroxine to 114mcg from 131mcg. Discontinued esomeprazole. Follow up in 6 months.   Recent consult visits:  None.  Hospital visits:  None in previous 6 months  Medications: Outpatient Encounter Medications as of 08/12/2021  Medication Sig Note   budesonide-formoterol (SYMBICORT) 80-4.5 MCG/ACT inhaler Inhale 2 puffs into the lungs 2 (two) times daily.    Cholecalciferol (VITAMIN D3 PO) Take 2,000 Units by mouth daily.    DULoxetine (CYMBALTA) 30 MG capsule TAKE ONE CAPSULE BY MOUTH BEFORE BREAKFAST    furosemide (LASIX) 40 MG tablet Patient reports taking three times a week 01/26/2016: Received from: External Pharmacy   levothyroxine (SYNTHROID) 150 MCG tablet Take 150 mcg by mouth daily before breakfast.    metoprolol tartrate (LOPRESSOR) 25 MG tablet Take 1 tablet (25 mg total) by mouth 2 (two) times daily.    mirabegron ER (MYRBETRIQ) 50 MG TB24 tablet Take 1 tablet (50 mg total) by mouth daily.    montelukast (SINGULAIR) 10 MG tablet TAKE ONE TABLET BY MOUTH BEFORE BREAKFAST    simvastatin (ZOCOR) 20 MG tablet TAKE ONE TABLET BY MOUTH EVERYDAY AT BEDTIME    No facility-administered encounter medications on file as of 08/12/2021.   Fill History: budesonide-formoterol HFA 80 mcg-4.5 mcg/actuation aerosol inhaler 06/24/2021 30   duloxetine 30 mg capsule,delayed release 06/24/2021 30   furosemide 40 mg tablet 06/24/2021 28   levothyroxine 150 mcg tablet 06/24/2021 30   metoprolol tartrate 25 mg tablet 06/24/2021 30   Myrbetriq 25 mg tablet,extended release 06/24/2021 30   montelukast 10 mg tablet 06/24/2021 30   simvastatin 20 mg tablet 06/24/2021 30   amlodipine 10 mg tablet 05/27/2021  30   Reviewed chart for medication changes ahead of medication coordination call.  No OVs, Consults, or hospital visits since last care coordination call/Pharmacist visit. (If appropriate, list visit date, provider name)  No medication changes indicated OR if recent visit, treatment plan here.  BP Readings from Last 3 Encounters:  07/28/21 (!) 132/52  06/09/21 140/80  04/28/21 138/78    No results found for: HGBA1C   Patient obtains medications through Adherence Packaging  30 Days   Last adherence delivery included: (medication name and frequency) Duloxetine 30 mg: one tablet before breakfast Levothyroxine 150 mcg: one and half (1.5) tablet before breakfast on Mondays and 1 tablet before breakfast on all other days Amlodipine 10 mg:one tablet before breakfast Furosemide 40 mg: one tablet before breakfast Monday, Wednesday, and Friday Montelukast 10 mg: one tablet before breakfast Simvastatin 20 mg:one tablet at bedtime Myrbetriq 25 mg:one tablet at bedtime Vitamin D 2000 units: one tablet at bedtime   This delivery to include: NOTHING.  Spoke with patient per [name] request who states she has around 40 days of medication left and is still taking Julys medications form her 07/01/21 delivery. Patient requested to be called back for a monthly call in October. As of right now patients new sync date looks like 09/24/21.   Star Rating Drugs:  Simvastatin 20mg  - last filled on 05/03/21 30DS at [place] Pharmacist Assistant [phone]

## 2021-08-13 NOTE — Patient Instructions (Signed)
Hi [name],  It was great to speak with you again! Below is a summary of some of the topics we discussed.   Please reach out to me if you have any questions or need anything before our follow up!  Best, Maddie  [name], PharmD, [facility]   Visit Information   Goals Addressed   None    Patient Care Plan: CCM Pharmacy Care Plan     Problem Identified: Problem: Hypertension, Hyperlipidemia, GERD, Asthma, Chronic Kidney Disease, Hypothyroidism, Osteoarthritis and Overactive Bladder      Long-Range Goal: Patient-Specific Goal   Start Date: 03/12/2021  Expected End Date: 03/12/2022  Recent Progress: On track  Priority: High  Note:   Current Barriers:  Unable to independently monitor therapeutic efficacy Unable to self administer medications as prescribed Does not maintain contact with provider office  Pharmacist Clinical Goal(s):  Patient will achieve control of hypothyroidism as evidenced by TSH achieve ability to self administer medications as prescribed through use of use of a timer as evidenced by patient report through collaboration with PharmD and provider.   Interventions: 1:1 collaboration with Caren Macadam, MD regarding development and update of comprehensive plan of care as evidenced by provider attestation and co-signature Inter-disciplinary care team collaboration (see longitudinal plan of care) Comprehensive medication review performed; medication list updated in electronic medical record  Hypertension (BP goal <140/90) -Controlled -Current treatment: amlodipine 10mg , 1 tablet once daily  -Medications previously tried: benazepril, Benicar, HCTZ, losartan, olmesartan -Current home readings: does not check at home -Current dietary habits: limiting salt intake -Current exercise habits: no structured exercise -Denies hypotensive/hypertensive symptoms -Educated on Exercise goal of 150  minutes per week; Importance of home blood pressure monitoring; -Counseled to monitor BP at home weekly, document, and provide log at future appointments -Recommended to continue current medication  Hyperlipidemia: (LDL goal < 100) -Uncontrolled -Current treatment: simvastatin 20mg , 1 tablet at bedtime -Medications previously tried: none  -Current dietary patterns: patient reports diet is not as good as it should be -Current exercise habits: we discussed adding in walking (15-20 minutes) a few days per week and patient thinks she will be able to do this - [place] -Educated on Cholesterol goals;  Importance of limiting foods high in cholesterol; Exercise goal of 150 minutes per week; -Recommended to continue current medication Recommended increasing cholesterol medication  Asthma (Goal: control symptoms) -Controlled -Current treatment  montelukast 10mg , 1 tablet daily albuterol HFA inhaler (has not needed to use over a year)  -Medications previously tried: Qvar, Dulera  -Exacerbations requiring treatment in last 6 months: none -Patient denies consistent use of maintenance inhaler -Frequency of rescue inhaler use: not often -Counseled on When to use rescue inhaler -Recommended to continue current medication   Hypothyroidism (Goal: TSH 0.35-4.5) -Controlled -Current treatment  levothyroxine 150 mcg, 1 tablet before breakfast and 1 and 1/2 tablet once weekly on Mondays -Medications previously tried: none  -Counseled on taking the medication on an empty stomach separate from all other medications  GERD (Goal: minimize symptoms) -Controlled -Current treatment  Esomeprazole 20 mg once a week or less -Medications previously tried: none  -Counseled on non-pharmacological interventions for acid reflux. Take measures to prevent acid reflux, such as avoiding spicy foods, avoiding caffeine, avoid laying down a few hours after eating, and raising the head of  the bed  Pain (Goal: minimize pain) -Controlled -Current treatment  Tylenol 325 mg as needed -Medications previously tried: Tramadol (nauseous)  -  Recommended to continue current medication  Overactive bladder (Goal: minimize symptoms) -Not ideally controlled -Current treatment  Myrbetriq 25mg , 1 tablet daily -Medications previously tried: Vesicare (solifenacin), Detrol LA (tolterodine) -Recommended to continue current medication  Leg swelling (Goal: minimize swelling of lower extremities) -Controlled -Current treatment  furosemide 40mg , 1 tablet daily as needed (3x a week) -Medications previously tried: none  -Recommended to continue current medication  Vitamin D deficiency (Goal: vitamin D 30-100) -Uncontrolled -Current treatment  Vitamin D 2000 units daily -Medications previously tried: n/a  -Recommended to continue current medication   Health Maintenance -Vaccine gaps: none -Current therapy:  No medications -Educated on Cost vs benefit of each product must be carefully weighed by individual consumer -Patient is satisfied with current therapy and denies issues -Recommended to continue as is  Patient Goals/Self-Care Activities Patient will:  - take medications as prescribed focus on medication adherence by setting a timer to take your medications check blood pressure weekly, document, and provide at future appointments  Follow Up Plan: Telephone follow up appointment with care management team member scheduled for: 6 months       Patient verbalizes understanding of instructions provided today and agrees to view in [place].  Telephone follow up appointment with pharmacy team member scheduled for: 6 months  Viona Gilmore, [facility]

## 2021-09-15 ENCOUNTER — Telehealth: Payer: Self-pay | Admitting: Pharmacist

## 2021-09-15 NOTE — Chronic Care Management (AMB) (Signed)
Chronic Care Management Pharmacy Assistant   Name: [name]  MRN: [id number] DOB: Aug 08, 1935   Reason for Encounter: Disease State/ General Assessment Call.   Conditions to be addressed/monitored: HTN and Hypothyroidism.  Recent office visits:  None.  Recent consult visits:  None.   Hospital visits:  None in previous 6 months  Medications: Outpatient Encounter Medications as of 09/15/2021  Medication Sig Note   budesonide-formoterol (SYMBICORT) 80-4.5 MCG/ACT inhaler Inhale 2 puffs into the lungs 2 (two) times daily.    Cholecalciferol (VITAMIN D3 PO) Take 2,000 Units by mouth daily.    DULoxetine (CYMBALTA) 30 MG capsule TAKE ONE CAPSULE BY MOUTH BEFORE BREAKFAST    furosemide (LASIX) 40 MG tablet Patient reports taking three times a week 01/26/2016: Received from: External Pharmacy   levothyroxine (SYNTHROID) 150 MCG tablet Take 150 mcg by mouth daily before breakfast.    metoprolol tartrate (LOPRESSOR) 25 MG tablet Take 1 tablet (25 mg total) by mouth 2 (two) times daily.    mirabegron ER (MYRBETRIQ) 50 MG TB24 tablet Take 1 tablet (50 mg total) by mouth daily.    montelukast (SINGULAIR) 10 MG tablet TAKE ONE TABLET BY MOUTH BEFORE BREAKFAST    simvastatin (ZOCOR) 20 MG tablet TAKE ONE TABLET BY MOUTH EVERYDAY AT BEDTIME    No facility-administered encounter medications on file as of 09/15/2021.   Fill History: budesonide-formoterol HFA 80 mcg-4.5 mcg/actuation aerosol inhaler 06/24/2021 30   duloxetine 30 mg capsule,delayed release 06/24/2021 30   furosemide 40 mg tablet 06/24/2021 28   levothyroxine 150 mcg tablet 06/24/2021 30   metoprolol tartrate 25 mg tablet 06/24/2021 30   Myrbetriq 25 mg tablet,extended release 06/24/2021 30   montelukast 10 mg tablet 06/24/2021 30   simvastatin 20 mg tablet 06/24/2021 30   amlodipine 10 mg tablet 05/27/2021 30   Reviewed chart prior to disease state call. Spoke with patient regarding BP  Recent Office Vitals: BP  Readings from Last 3 Encounters:  07/28/21 (!) 132/52  06/09/21 140/80  04/28/21 138/78   Pulse Readings from Last 3 Encounters:  07/28/21 77  06/09/21 82  04/28/21 83    Wt Readings from Last 3 Encounters:  07/28/21 232 lb 6.4 oz (105.4 kg)  06/09/21 230 lb 3.2 oz (104.4 kg)  04/28/21 225 lb 11.2 oz (102.4 kg)     Kidney Function Lab Results  Component Value Date/Time   CREATININE 1.52 (H) 04/28/2021 01:41 PM   CREATININE 1.42 (H) 11/25/2020 11:33 AM   CREATININE 1.59 (H) 07/29/2020 11:52 AM   GFR 31.51 (L) 04/28/2021 01:41 PM   GFRNONAA 27 (L) 11/06/2015 02:00 PM   GFRAA 32 (L) 11/06/2015 02:00 PM    BMP Latest Ref Rng & Units 04/28/2021 04/28/2021 11/25/2020  Glucose 70 - 99 mg/dL - 100(H) -  BUN 6 - 23 mg/dL - 21 -  Creatinine 0.40 - 1.20 mg/dL - 1.52(H) -  BUN/Creat Ratio 6 - 22 (calc) - - -  Sodium 135 - 145 mEq/L - 141 -  Potassium 3.5 - 5.1 mEq/L - 4.1 -  Chloride 96 - 112 mEq/L - 106 -  CO2 19 - 32 mEq/L - 26 -  Calcium 8.6 - 10.4 mg/dL 10.3 10.3 11.1(H)    Current antihypertensive regimen:  Metoprolol tartrate 25mg  - take 1 tablet by mouth two times daily.  How often are you checking your Blood Pressure?  Current home BP readings:  What recent interventions/DTPs have been made by any provider to improve Blood Pressure  control since last CPP Visit: None. Any recent hospitalizations or ED visits since last visit with CPP? No  Adherence Review: Is the patient currently on ACE/ARB medication? No Does the patient have >5 day gap between last estimated fill dates? No  Unsuccessful at reaching patient.   Care Gaps:  AWV - scheduled for 11/19/21 Covid-19 vaccine booster 4 - overdue since 02/09/21 Flu vaccine - due Mammogram - due   Star Rating Drugs:  Simvastatin 20mg  - last filled on 06/24/21 30DS at [place] [phone]

## 2021-09-17 ENCOUNTER — Other Ambulatory Visit: Payer: Self-pay | Admitting: Family Medicine

## 2021-09-20 ENCOUNTER — Telehealth: Payer: Self-pay | Admitting: Pharmacist

## 2021-09-20 NOTE — Chronic Care Management (AMB) (Signed)
Chronic Care Management Pharmacy Assistant   Name: [name]  MRN: [id number] DOB: 08/02/1935   Reason for Encounter: Disease State and Medication Review   Conditions to be addressed/monitored: HTN and Hypothyroidism.  Recent office visits:  None.  Recent consult visits:  None.  Hospital visits:  None in previous 6 months  Medications: Outpatient Encounter Medications as of 09/20/2021  Medication Sig Note   budesonide-formoterol (SYMBICORT) 80-4.5 MCG/ACT inhaler Inhale 2 puffs into the lungs 2 (two) times daily.    Cholecalciferol (VITAMIN D3 PO) Take 2,000 Units by mouth daily.    DULoxetine (CYMBALTA) 30 MG capsule TAKE ONE CAPSULE BY MOUTH BEFORE BREAKFAST    furosemide (LASIX) 40 MG tablet Patient reports taking three times a week 01/26/2016: Received from: External Pharmacy   levothyroxine (SYNTHROID) 150 MCG tablet Take 150 mcg by mouth daily before breakfast.    metoprolol tartrate (LOPRESSOR) 25 MG tablet Take 1 tablet (25 mg total) by mouth 2 (two) times daily.    mirabegron ER (MYRBETRIQ) 50 MG TB24 tablet Take 1 tablet (50 mg total) by mouth daily.    montelukast (SINGULAIR) 10 MG tablet TAKE ONE TABLET BY MOUTH BEFORE BREAKFAST    simvastatin (ZOCOR) 20 MG tablet TAKE ONE TABLET BY MOUTH EVERYDAY AT BEDTIME    No facility-administered encounter medications on file as of 09/20/2021.   Fill History: budesonide-formoterol HFA 80 mcg-4.5 mcg/actuation aerosol inhaler 06/24/2021 30   duloxetine 30 mg capsule,delayed release 06/24/2021 30   furosemide 40 mg tablet 06/24/2021 28   levothyroxine 150 mcg tablet 06/24/2021 30   metoprolol tartrate 25 mg tablet 06/24/2021 30   Myrbetriq 25 mg tablet,extended release 06/24/2021 30   montelukast 10 mg tablet 06/24/2021 30   simvastatin 20 mg tablet 06/24/2021 30   amlodipine 10 mg tablet 05/27/2021 30   Reviewed chart for medication changes ahead of medication coordination call.  No OVs, Consults, or hospital  visits since last care coordination call/Pharmacist visit. (If appropriate, list visit date, provider name)  No medication changes indicated OR if recent visit, treatment plan here.  BP Readings from Last 3 Encounters:  07/28/21 (!) 132/52  06/09/21 140/80  04/28/21 138/78    No results found for: HGBA1C   Patient obtains medications through Adherence Packaging  30 Days   Last adherence delivery included:  Duloxetine 30 mg: one tablet before breakfast Levothyroxine 150 mcg: one and half (1.5) tablet before breakfast on Mondays and 1 tablet before breakfast on all other days Amlodipine 10 mg:one tablet before breakfast Furosemide 40 mg: one tablet before breakfast Monday, Wednesday, and Friday Montelukast 10 mg: one tablet before breakfast Simvastatin 20 mg:one tablet at bedtime Myrbetriq 25 mg:one tablet at bedtime Vitamin D 2000 units: one tablet at bedtime    Patient declined (meds) last month due to PRN use/additional supply on hand. Duloxetine 30 mg: one tablet before breakfast Levothyroxine 150 mcg: one and half (1.5) tablet before breakfast on Mondays and 1 tablet before breakfast on all other days Amlodipine 10 mg:one tablet before breakfast Furosemide 40 mg: one tablet before breakfast Monday, Wednesday, and Friday Montelukast 10 mg: one tablet before breakfast Simvastatin 20 mg:one tablet at bedtime Myrbetriq 25 mg:one tablet at bedtime Vitamin D 2000 units: one tablet at bedtime  Explanation of abundance on hand per patient on  08/12/21 Spoke with patient per [name] request who states she has around 40 days of medication left and is still taking Julys medications form her 07/01/21 delivery. Patient requested  to be called back for a monthly call in October. As of right now patients new sync date looks like 09/24/21.  Patient is due for next adherence delivery on: 09/29/21. Called patient and reviewed medications and coordinated delivery.  This delivery to  include: Duloxetine 30 mg: one tablet before breakfast Levothyroxine 150 mcg: one and half (1.5) tablet before breakfast on Mondays and 1 tablet before breakfast on all other days Amlodipine 10 mg:one tablet before breakfast Furosemide 40 mg: one tablet before breakfast Monday, Wednesday, and Friday Montelukast 10 mg: one tablet before breakfast Simvastatin 20 mg:one tablet at bedtime Myrbetriq 25 mg:one tablet at bedtime Vitamin D 2000 units: one tablet at bedtime  Patient will need a short fill of (med), prior to adherence delivery. (To align with sync date or if PRN med)  Coordinated acute fill for (med) to be delivered (date).  Patient declined the following medications (meds) due to (reason)  Patient needs refills for .  Confirmed delivery date of 09/29/21, advised patient that pharmacy will contact them the morning of delivery.   Reviewed chart prior to disease state call. Spoke with patient regarding BP  Recent Office Vitals: BP Readings from Last 3 Encounters:  07/28/21 (!) 132/52  06/09/21 140/80  04/28/21 138/78   Pulse Readings from Last 3 Encounters:  07/28/21 77  06/09/21 82  04/28/21 83    Wt Readings from Last 3 Encounters:  07/28/21 232 lb 6.4 oz (105.4 kg)  06/09/21 230 lb 3.2 oz (104.4 kg)  04/28/21 225 lb 11.2 oz (102.4 kg)     Kidney Function Lab Results  Component Value Date/Time   CREATININE 1.52 (H) 04/28/2021 01:41 PM   CREATININE 1.42 (H) 11/25/2020 11:33 AM   CREATININE 1.59 (H) 07/29/2020 11:52 AM   GFR 31.51 (L) 04/28/2021 01:41 PM   GFRNONAA 27 (L) 11/06/2015 02:00 PM   GFRAA 32 (L) 11/06/2015 02:00 PM    BMP Latest Ref Rng & Units 04/28/2021 04/28/2021 11/25/2020  Glucose 70 - 99 mg/dL - 100(H) -  BUN 6 - 23 mg/dL - 21 -  Creatinine 0.40 - 1.20 mg/dL - 1.52(H) -  BUN/Creat Ratio 6 - 22 (calc) - - -  Sodium 135 - 145 mEq/L - 141 -  Potassium 3.5 - 5.1 mEq/L - 4.1 -  Chloride 96 - 112 mEq/L - 106 -  CO2 19 - 32 mEq/L - 26 -  Calcium  8.6 - 10.4 mg/dL 10.3 10.3 11.1(H)    Current antihypertensive regimen:  Metoprolol tartrate 25mg  - take 1 tablet by mouth two times daily.  How often are you checking your Blood Pressure?  Current home BP readings:  What recent interventions/DTPs have been made by any provider to improve Blood Pressure control since last CPP Visit: None. Any recent hospitalizations or ED visits since last visit with CPP? No  Adherence Review: Is the patient currently on ACE/ARB medication? No Does the patient have >5 day gap between last estimated fill dates? No  Unsuccessful at reaching patient. Moved to step 4.  Care Gaps:  AWV - scheduled for 11/19/21 Covid-19 vaccine booster 4 - overdue since 02/09/21 Flu vaccine - due Mammogram - due  Star Rating Drugs:  Simvastatin 20mg  - last filled on 06/24/21 30DS at [place] [phone]

## 2021-10-07 ENCOUNTER — Telehealth: Payer: Self-pay | Admitting: Pharmacist

## 2021-10-07 NOTE — Chronic Care Management (AMB) (Signed)
Chronic Care Management Pharmacy Assistant   Name: [name]  MRN: [id number] DOB: 1935-08-16  Reason for Encounter: Medication Review/ Medication Coordination Call.    Recent office visits:  None.  Recent consult visits:  None.  Hospital visits:  None in previous 6 months  Medications: Outpatient Encounter Medications as of 10/07/2021  Medication Sig Note   Cholecalciferol (VITAMIN D3 PO) Take 2,000 Units by mouth daily.    DULoxetine (CYMBALTA) 30 MG capsule TAKE ONE CAPSULE BY MOUTH BEFORE BREAKFAST    furosemide (LASIX) 40 MG tablet Patient reports taking three times a week 01/26/2016: Received from: External Pharmacy   levothyroxine (SYNTHROID) 150 MCG tablet Take 150 mcg by mouth daily before breakfast.    metoprolol tartrate (LOPRESSOR) 25 MG tablet Take 1 tablet (25 mg total) by mouth 2 (two) times daily.    mirabegron ER (MYRBETRIQ) 50 MG TB24 tablet Take 1 tablet (50 mg total) by mouth daily.    montelukast (SINGULAIR) 10 MG tablet TAKE ONE TABLET BY MOUTH BEFORE BREAKFAST    simvastatin (ZOCOR) 20 MG tablet TAKE ONE TABLET BY MOUTH EVERYDAY AT BEDTIME    [DISCONTINUED] budesonide-formoterol (SYMBICORT) 80-4.5 MCG/ACT inhaler Inhale 2 puffs into the lungs 2 (two) times daily.    No facility-administered encounter medications on file as of 10/07/2021.   Fill History: budesonide-formoterol HFA 80 mcg-4.5 mcg/actuation aerosol inhaler 09/22/2021 30   duloxetine 30 mg capsule,delayed release 09/22/2021 30   furosemide 40 mg tablet 09/22/2021 28   levothyroxine 150 mcg tablet 09/22/2021 30   metoprolol tartrate 25 mg tablet 09/22/2021 30   Myrbetriq 50 mg tablet,extended release 09/22/2021 30   montelukast 10 mg tablet 09/22/2021 30   simvastatin 20 mg tablet 09/22/2021 30   amlodipine 10 mg tablet 05/27/2021 30   Reviewed chart for medication changes ahead of medication coordination call.  No OVs, Consults, or hospital visits since last care  coordination call/Pharmacist visit. (If appropriate, list visit date, provider name)  No medication changes indicated OR if recent visit, treatment plan here.  BP Readings from Last 3 Encounters:  07/28/21 (!) 132/52  06/09/21 140/80  04/28/21 138/78    No results found for: HGBA1C   Patient obtains medications through Adherence Packaging  30 Days   Last adherence delivery included:  Duloxetine 30 mg: one tablet before breakfast Levothyroxine 150 mcg: one and half (1.5) tablet before breakfast on Mondays and 1 tablet before breakfast on all other days Amlodipine 10 mg:one tablet before breakfast Furosemide 40 mg: one tablet before breakfast Monday, Wednesday, and Friday Montelukast 10 mg: one tablet before breakfast Simvastatin 20 mg:one tablet at bedtime Myrbetriq 25 mg:one tablet at bedtime Vitamin D 2000 units: one tablet at bedtime  Patient declined (meds) last month due to PRN use/additional supply on hand. Unable to get a hold of patient last delivery.  Patient is due for next adherence delivery on: 10/21/21. Called patient and reviewed medications and coordinated delivery.  This delivery to include: Duloxetine 30 mg: one tablet before breakfast Levothyroxine 150 mcg: one and half (1.5) tablet before breakfast on Mondays and 1 tablet before breakfast on all other days Amlodipine 10 mg:one tablet before breakfast Furosemide 40 mg: one tablet before breakfast Monday, Wednesday, and Friday Montelukast 10 mg: one tablet before breakfast Simvastatin 20 mg:one tablet at bedtime Myrbetriq 25 mg:one tablet at bedtime Vitamin D 2000 units: one tablet at bedtime Metoprolol tartrate 25mg  - one tablet before breakfast and one tablet at bedtime. Symbicort - 2  puffs into the lungs twice daily.   Patient will need a short fill of (med), prior to adherence delivery. (To align with sync date or if PRN med)  Coordinated acute fill for (med) to be delivered (date).  Patient declined  the following medications (meds) due to (reason)  Patient needs refills for .  Confirmed delivery date of 10/21/21, advised patient that pharmacy will contact them the morning of delivery.   Unsuccessful at reaching patient by phone.  Care Gaps:  AWV - scheduled for 11/19/21 Covid-19 vaccine booster 4 - overdue since 02/09/21 Flu vaccine - due Mammogram - due  Star Rating Drugs:  Simvastatin 20mg  - last filled on 09/22/21 30DS at [place] [phone]

## 2021-10-12 ENCOUNTER — Other Ambulatory Visit: Payer: Self-pay | Admitting: Family Medicine

## 2021-10-12 NOTE — Telephone Encounter (Cosign Needed)
2nd attempt

## 2021-10-13 NOTE — Telephone Encounter (Cosign Needed)
3rd attempt

## 2021-11-09 ENCOUNTER — Telehealth: Payer: Self-pay | Admitting: Pharmacist

## 2021-11-09 NOTE — Chronic Care Management (AMB) (Signed)
Chronic Care Management Pharmacy Assistant   Name: MITALI SHENEFIELD  MRN: [id number] DOB: Oct 23, 1935  Reason for Encounter: Medication Review / Medication Coordination Call   Conditions to be addressed/monitored: HTN  Recent office visits:  None  Recent consult visits:  None  Hospital visits:  None  Medications: Outpatient Encounter Medications as of 11/09/2021  Medication Sig Note   budesonide-formoterol (SYMBICORT) 80-4.5 MCG/ACT inhaler Inhale 2 puffs into the lungs 2 times daily.    Cholecalciferol (VITAMIN D3 PO) Take 2,000 Units by mouth daily.    DULoxetine (CYMBALTA) 30 MG capsule TAKE ONE CAPSULE BY MOUTH BEFORE BREAKFAST    furosemide (LASIX) 40 MG tablet Patient reports taking three times a week 01/26/2016: Received from: External Pharmacy   levothyroxine (SYNTHROID) 150 MCG tablet TAKE 1 AND 1/2 TABLETS BY MOUTH ONCE WEEKLY BEFORE BREAKFAST ON MONDAY and TAKE ONE TABLET BY MOUTH ONCE WEEKLY BEFOR BREAKFAST ON  TUESDAYS and TAKE ONE TABLET BY MOUTH ONCE WEEKLY BEFORE BREAKFAST ON WEDNESDAYS and TAKE ONE TABLET BY MOUTH ONCE WEEKLY BEFORE BREAKFAST ON THURSDAYS and TAKE ONE TABLET BY MOUTH ONCE WEEKLY BEFORE BREAKFAST ON FRIDAYS and TAKE ONE TABLET BY MOUTH ONCE WEEKLY BEFORE BREAKFAST ON SATURDAY and TAKE ONE TABLET BY MOUTH ONCE WEEKLY BEFORE BREAKFAST ON SUNDAYS    metoprolol tartrate (LOPRESSOR) 25 MG tablet Take 1 tablet (25 mg total) by mouth 2 (two) times daily.    mirabegron ER (MYRBETRIQ) 50 MG TB24 tablet Take 1 tablet (50 mg total) by mouth daily.    montelukast (SINGULAIR) 10 MG tablet TAKE ONE TABLET BY MOUTH BEFORE BREAKFAST    simvastatin (ZOCOR) 20 MG tablet TAKE ONE TABLET BY MOUTH EVERYDAY AT BEDTIME    No facility-administered encounter medications on file as of 11/09/2021.  Reviewed chart for medication changes ahead of medication coordination call.  No OVs, Consults, or hospital visits since last care coordination call/Pharmacist visit. (If appropriate,  list visit date, provider name)  No medication changes indicated OR if recent visit, treatment plan here.  BP Readings from Last 3 Encounters:  07/28/21 (!) 132/52  06/09/21 140/80  04/28/21 138/78    No results found for: HGBA1C   Patient obtains medications through Adherence Packaging  30 Days   Last adherence delivery included:  Duloxetine 30 mg: one tablet before breakfast Levothyroxine 150 mcg: one and half (1.5) tablet before breakfast on Mondays and 1 tablet before breakfast on all other days Amlodipine 10 mg:one tablet before breakfast Furosemide 40 mg: one tablet before breakfast Monday, Wednesday, and Friday Montelukast 10 mg: one tablet before breakfast Simvastatin 20 mg:one tablet at bedtime Myrbetriq 25 mg:one tablet at bedtime Vitamin D 2000 units: one tablet at bedtime Metoprolol tartrate 25mg  - one tablet before breakfast and one tablet at bedtime. Symbicort - 2 puffs into the lungs twice daily.    Patient declined (meds) last month: None  Patient is due for next adherence delivery on: 11/22/2021.  Called patient and reviewed medications and coordinated delivery.  This delivery to include: Duloxetine 30 mg: one tablet before breakfast Levothyroxine 150 mcg: one and half (1.5) tablet before breakfast on Mondays and 1 tablet before breakfast on all other days Amlodipine 10 mg:one tablet before breakfast Furosemide 40 mg: one tablet before breakfast Monday, Wednesday, and Friday Montelukast 10 mg: one tablet before breakfast Simvastatin 20 mg:one tablet at bedtime Myrbetriq 25 mg:one tablet at bedtime Vitamin D 2000 units: one tablet at bedtime Metoprolol tartrate 25mg  - one tablet before breakfast and one  tablet at bedtime. Symbicort - 2 puffs into the lungs twice daily.   Patient will need a short fill:  Coordinated acute fill:  Patient declined the following medications:  Unable to confirmed delivery date of 11/22/2021  Unable to reach patient after  a couple attempts, fwd to step 2 due to upcoming holiday.  Care Gaps: AWV - scheduled for 11/19/21 Last BP - 132/52 on 07/28/2021 Covid-19 vaccine - overdue  Flu vaccine - due Mammogram - due  Star Rating Drugs: Simvastatin 20mg  - last filled on 10/05/21 30DS at [facility] [phone]

## 2021-11-19 ENCOUNTER — Ambulatory Visit: Payer: PPO

## 2021-11-30 ENCOUNTER — Telehealth: Payer: Self-pay | Admitting: Pharmacist

## 2021-11-30 NOTE — Chronic Care Management (AMB) (Signed)
    Chronic Care Management Pharmacy Assistant   Name: COLLEENA KURTENBACH  MRN: [id number] DOB: 12-19-1935  Reason for Encounter: Reschedule 12/30/2021 appointment.   Unable to reach patient after several attempts.  Care Gaps: AWV - scheduled for 11/19/21 Last BP - 132/52 on 07/28/2021 Covid-19 vaccine - overdue  Flu vaccine - due Mammogram - due  Star Rating Drugs: Simvastatin 20mg  - last filled on 11/15/2021 30DS at [place] [phone]

## 2021-12-09 ENCOUNTER — Telehealth: Payer: Self-pay | Admitting: Pharmacist

## 2021-12-09 NOTE — Chronic Care Management (AMB) (Signed)
Chronic Care Management Pharmacy Assistant   Name: [name]  MRN: [id number] DOB: 06/10/1935  Reason for Encounter: Medication Review / Medication Coordination Call   Conditions to be addressed/monitored: HTN  Recent office visits:  None  Recent consult visits:  None  Hospital visits:  None  Medications: Outpatient Encounter Medications as of 12/09/2021  Medication Sig Note   budesonide-formoterol (SYMBICORT) 80-4.5 MCG/ACT inhaler Inhale 2 puffs into the lungs 2 times daily.    Cholecalciferol (VITAMIN D3 PO) Take 2,000 Units by mouth daily.    DULoxetine (CYMBALTA) 30 MG capsule TAKE ONE CAPSULE BY MOUTH BEFORE BREAKFAST    furosemide (LASIX) 40 MG tablet Patient reports taking three times a week 01/26/2016: Received from: External Pharmacy   levothyroxine (SYNTHROID) 150 MCG tablet TAKE 1 AND 1/2 TABLETS BY MOUTH ONCE WEEKLY BEFORE BREAKFAST ON MONDAY and TAKE ONE TABLET BY MOUTH ONCE WEEKLY BEFOR BREAKFAST ON  TUESDAYS and TAKE ONE TABLET BY MOUTH ONCE WEEKLY BEFORE BREAKFAST ON WEDNESDAYS and TAKE ONE TABLET BY MOUTH ONCE WEEKLY BEFORE BREAKFAST ON THURSDAYS and TAKE ONE TABLET BY MOUTH ONCE WEEKLY BEFORE BREAKFAST ON FRIDAYS and TAKE ONE TABLET BY MOUTH ONCE WEEKLY BEFORE BREAKFAST ON SATURDAY and TAKE ONE TABLET BY MOUTH ONCE WEEKLY BEFORE BREAKFAST ON SUNDAYS    metoprolol tartrate (LOPRESSOR) 25 MG tablet Take 1 tablet (25 mg total) by mouth 2 (two) times daily.    mirabegron ER (MYRBETRIQ) 50 MG TB24 tablet Take 1 tablet (50 mg total) by mouth daily.    montelukast (SINGULAIR) 10 MG tablet TAKE ONE TABLET BY MOUTH BEFORE BREAKFAST    simvastatin (ZOCOR) 20 MG tablet TAKE ONE TABLET BY MOUTH EVERYDAY AT BEDTIME    No facility-administered encounter medications on file as of 12/09/2021.  Reviewed chart for medication changes ahead of medication coordination call.  No OVs, Consults, or hospital visits since last care coordination call/Pharmacist visit. (If appropriate,  list visit date, provider name)  No medication changes indicated OR if recent visit, treatment plan here.  BP Readings from Last 3 Encounters:  07/28/21 (!) 132/52  06/09/21 140/80  04/28/21 138/78    No results found for: HGBA1C   Patient obtains medications through Adherence Packaging  30 Days    Last adherence delivery included:  Duloxetine 30 mg: one tablet before breakfast Levothyroxine 150 mcg: one and half (1.5) tablet before breakfast on Mondays and 1 tablet before breakfast on all other days Furosemide 40 mg: one tablet before breakfast Monday, Wednesday, and Friday Montelukast 10 mg: one tablet before breakfast Simvastatin 20 mg:one tablet at bedtime Myrbetriq 25 mg:one tablet at bedtime Vitamin D 2000 units: one tablet at bedtime Metoprolol tartrate 25mg  - one tablet before breakfast and one tablet at bedtime. Symbicort - 2 puffs into the lungs twice daily.    Patient declined (meds) last month: None   Patient is due for next adherence delivery on: 12/21/2021.   Unable to reach patient to review medications and coordinated delivery.   This delivery to include: Duloxetine 30 mg: one tablet before breakfast Levothyroxine 150 mcg: one and half (1.5) tablet before breakfast on Mondays and 1 tablet before breakfast on all other days Furosemide 40 mg: one tablet before breakfast Monday, Wednesday, and Friday Montelukast 10 mg: one tablet before breakfast Simvastatin 20 mg:one tablet at bedtime Myrbetriq 25 mg:one tablet at bedtime Vitamin D 2000 units: one tablet at bedtime Metoprolol tartrate 25mg  - one tablet before breakfast and one tablet at bedtime. Symbicort 80/4.5- 2  puffs into the lungs twice daily.    Patient will need a short fill:   Coordinated acute fill:   Patient declined the following medications:  Unable to confirmed delivery date of 12/21/2021. Unable to reach patient after several attempts. Unable to reschedule 12/30/2021 appointment with  [name].  Care Gaps: AWV - scheduled for 11/19/21 Last BP - 132/52 on 07/28/2021 Covid-19 vaccine - overdue  Flu vaccine - due Mammogram - due  Star Rating Drugs: Simvastatin 20mg  - last filled on 11/15/2021 30DS at [place] [phone]

## 2021-12-30 ENCOUNTER — Telehealth: Payer: PPO

## 2022-01-10 ENCOUNTER — Telehealth: Payer: Self-pay | Admitting: Pharmacist

## 2022-01-10 NOTE — Chronic Care Management (AMB) (Signed)
Chronic Care Management Pharmacy Assistant   Name: [name]  MRN: [id number] DOB: 19-Oct-1935  Reason for Encounter: Medication Review / Medication Coordination Call   Conditions to be addressed/monitored: HTN  Recent office visits:  None   Recent consult visits:  None  Hospital visits:  None  Medications: Outpatient Encounter Medications as of 01/10/2022  Medication Sig Note   budesonide-formoterol (SYMBICORT) 80-4.5 MCG/ACT inhaler Inhale 2 puffs into the lungs 2 times daily.    Cholecalciferol (VITAMIN D3 PO) Take 2,000 Units by mouth daily.    DULoxetine (CYMBALTA) 30 MG capsule TAKE ONE CAPSULE BY MOUTH BEFORE BREAKFAST    furosemide (LASIX) 40 MG tablet Patient reports taking three times a week 01/26/2016: Received from: External Pharmacy   levothyroxine (SYNTHROID) 150 MCG tablet TAKE 1 AND 1/2 TABLETS BY MOUTH ONCE WEEKLY BEFORE BREAKFAST ON MONDAY and TAKE ONE TABLET BY MOUTH ONCE WEEKLY BEFOR BREAKFAST ON  TUESDAYS and TAKE ONE TABLET BY MOUTH ONCE WEEKLY BEFORE BREAKFAST ON WEDNESDAYS and TAKE ONE TABLET BY MOUTH ONCE WEEKLY BEFORE BREAKFAST ON THURSDAYS and TAKE ONE TABLET BY MOUTH ONCE WEEKLY BEFORE BREAKFAST ON FRIDAYS and TAKE ONE TABLET BY MOUTH ONCE WEEKLY BEFORE BREAKFAST ON SATURDAY and TAKE ONE TABLET BY MOUTH ONCE WEEKLY BEFORE BREAKFAST ON SUNDAYS    metoprolol tartrate (LOPRESSOR) 25 MG tablet Take 1 tablet (25 mg total) by mouth 2 (two) times daily.    mirabegron ER (MYRBETRIQ) 50 MG TB24 tablet Take 1 tablet (50 mg total) by mouth daily.    montelukast (SINGULAIR) 10 MG tablet TAKE ONE TABLET BY MOUTH BEFORE BREAKFAST    simvastatin (ZOCOR) 20 MG tablet TAKE ONE TABLET BY MOUTH EVERYDAY AT BEDTIME    No facility-administered encounter medications on file as of 01/10/2022.   Reviewed chart for medication changes ahead of medication coordination call.  No OVs, Consults, or hospital visits since last care coordination call/Pharmacist visit. (If appropriate,  list visit date, provider name)  No medication changes indicated OR if recent visit, treatment plan here.  BP Readings from Last 3 Encounters:  07/28/21 (!) 132/52  06/09/21 140/80  04/28/21 138/78    No results found for: HGBA1C   Patient obtains medications through Adherence Packaging  30 Days    Last adherence delivery included:  Duloxetine 30 mg: one tablet before breakfast Levothyroxine 150 mcg: one and half (1.5) tablet before breakfast on Mondays and 1 tablet before breakfast on all other days Furosemide 40 mg: one tablet before breakfast Monday, Wednesday, and Friday Montelukast 10 mg: one tablet before breakfast Simvastatin 20 mg:one tablet at bedtime Myrbetriq 25 mg:one tablet at bedtime Vitamin D 2000 units: one tablet at bedtime Metoprolol tartrate 25mg  - one tablet before breakfast and one tablet at bedtime. Symbicort 80/4.5- 2 puffs into the lungs twice daily.    Patient declined (meds) last month: None   Patient is due for next adherence delivery on: 01/20/2022.   Reviewed medications and coordinated delivery.  SCHEDULE FOLLOW UP   This delivery to include: Duloxetine 30 mg: one tablet before breakfast Levothyroxine 150 mcg: one and half (1.5) tablet before breakfast on Mondays and 1 tablet before breakfast on all other days Furosemide 40 mg: one tablet before breakfast Monday, Wednesday, and Friday Montelukast 10 mg: one tablet before breakfast Simvastatin 20 mg:one tablet at bedtime Myrbetriq 25 mg:one tablet at bedtime Vitamin D 12mcg: one tablet at bedtime Metoprolol tartrate 25mg  - one tablet before breakfast and one tablet at bedtime. Symbicort 80/4.5- 2  puffs into the lungs twice daily.    Patient will need a short fill:   Coordinated acute fill:   Patient declined the following medications:  Unable to reach patient to confirmed delivery date of 01/20/2022.   Care Gaps: AWV - scheduled for 11/19/21 Last BP - 132/52 on 07/28/2021 Covid-19  vaccine - overdue  Flu vaccine - due Mammogram - due  Star Rating Drugs: Simvastatin 20mg  - last filled on 11/15/2021 30DS at [facility] [phone]

## 2022-01-14 ENCOUNTER — Other Ambulatory Visit: Payer: Self-pay | Admitting: Family Medicine

## 2022-01-28 ENCOUNTER — Encounter: Payer: Self-pay | Admitting: Family Medicine

## 2022-01-28 ENCOUNTER — Ambulatory Visit (INDEPENDENT_AMBULATORY_CARE_PROVIDER_SITE_OTHER): Payer: PPO | Admitting: Family Medicine

## 2022-01-28 VITALS — BP 110/68 | HR 83 | Temp 97.6°F | Ht 63.75 in | Wt 232.9 lb

## 2022-01-28 DIAGNOSIS — N184 Chronic kidney disease, stage 4 (severe): Secondary | ICD-10-CM | POA: Diagnosis not present

## 2022-01-28 DIAGNOSIS — R739 Hyperglycemia, unspecified: Secondary | ICD-10-CM

## 2022-01-28 DIAGNOSIS — Z23 Encounter for immunization: Secondary | ICD-10-CM

## 2022-01-28 DIAGNOSIS — E7800 Pure hypercholesterolemia, unspecified: Secondary | ICD-10-CM | POA: Diagnosis not present

## 2022-01-28 DIAGNOSIS — I10 Essential (primary) hypertension: Secondary | ICD-10-CM | POA: Diagnosis not present

## 2022-01-28 DIAGNOSIS — E039 Hypothyroidism, unspecified: Secondary | ICD-10-CM

## 2022-01-28 DIAGNOSIS — E349 Endocrine disorder, unspecified: Secondary | ICD-10-CM | POA: Diagnosis not present

## 2022-01-28 DIAGNOSIS — J4520 Mild intermittent asthma, uncomplicated: Secondary | ICD-10-CM | POA: Diagnosis not present

## 2022-01-28 DIAGNOSIS — Z0000 Encounter for general adult medical examination without abnormal findings: Secondary | ICD-10-CM | POA: Diagnosis not present

## 2022-01-28 LAB — CBC WITH DIFFERENTIAL/PLATELET
Basophils Absolute: 0 10*3/uL (ref 0.0–0.1)
Basophils Relative: 0.4 % (ref 0.0–3.0)
Eosinophils Absolute: 0.2 10*3/uL (ref 0.0–0.7)
Eosinophils Relative: 3.8 % (ref 0.0–5.0)
HCT: 45.6 % (ref 36.0–46.0)
Hemoglobin: 14.8 g/dL (ref 12.0–15.0)
Lymphocytes Relative: 21.9 % (ref 12.0–46.0)
Lymphs Abs: 1.1 10*3/uL (ref 0.7–4.0)
MCHC: 32.4 g/dL (ref 30.0–36.0)
MCV: 93.3 fl (ref 78.0–100.0)
Monocytes Absolute: 0.4 10*3/uL (ref 0.1–1.0)
Monocytes Relative: 7.5 % (ref 3.0–12.0)
Neutro Abs: 3.4 10*3/uL (ref 1.4–7.7)
Neutrophils Relative %: 66.4 % (ref 43.0–77.0)
Platelets: 199 10*3/uL (ref 150.0–400.0)
RBC: 4.89 Mil/uL (ref 3.87–5.11)
RDW: 14 % (ref 11.5–15.5)
WBC: 5.2 10*3/uL (ref 4.0–10.5)

## 2022-01-28 LAB — COMPREHENSIVE METABOLIC PANEL
ALT: 13 U/L (ref 0–35)
AST: 16 U/L (ref 0–37)
Albumin: 3.4 g/dL — ABNORMAL LOW (ref 3.5–5.2)
Alkaline Phosphatase: 110 U/L (ref 39–117)
BUN: 18 mg/dL (ref 6–23)
CO2: 30 mEq/L (ref 19–32)
Calcium: 10.7 mg/dL — ABNORMAL HIGH (ref 8.4–10.5)
Chloride: 103 mEq/L (ref 96–112)
Creatinine, Ser: 1.71 mg/dL — ABNORMAL HIGH (ref 0.40–1.20)
GFR: 27.21 mL/min — ABNORMAL LOW (ref >60.00)
Glucose, Bld: 86 mg/dL (ref 70–99)
Potassium: 4.4 mEq/L (ref 3.5–5.1)
Sodium: 140 mEq/L (ref 135–145)
Total Bilirubin: 0.8 mg/dL (ref 0.2–1.2)
Total Protein: 6.1 g/dL (ref 6.0–8.3)

## 2022-01-28 LAB — LIPID PANEL
Cholesterol: 153 mg/dL (ref 0–200)
HDL: 55.5 mg/dL (ref >39.00)
LDL Cholesterol: 80 mg/dL (ref 0–99)
NonHDL: 97.37
Total CHOL/HDL Ratio: 3
Triglycerides: 86 mg/dL (ref 0.0–149.0)
VLDL: 17.2 mg/dL (ref 0.0–40.0)

## 2022-01-28 LAB — HEMOGLOBIN A1C: Hgb A1c MFr Bld: 5.1 % (ref 4.6–6.5)

## 2022-01-28 LAB — TSH: TSH: 5.42 u[IU]/mL (ref 0.35–5.50)

## 2022-01-28 NOTE — Progress Notes (Signed)
[name] DOB: 1935-08-08 Encounter date: 01/28/2022  This is a 88 y.o. female who presents for complete physical   History of present illness/Additional concerns: *patient doesn't take lasix regularly, but will if ankles swollen. Not regularly. Not even weekly. Probably not more than once a month.   *urinary urgency/incontinence: myrbetriq does help; not fully correcting. Needs to go ahead of time or she will be incontinent.   *not using symbicort inhaler. Has this at home.   Does have diarrhea sometimes. DIL wondering if it is medication reaction. At least every other week. Lasts less than a day. Urgency with this. No abdominal pain.   Appetite is good. Not always good about vegetables. Not a big snacker. Not a lot of sweets.   When legs swell it is salt related.   Has help around the house 1-2 times/week. Gets grubhub to eat at least a few times/week.  She has not yet seen kidney specialist. Just didn't make appointment.  Last visit was 11/2020.  She saw [facility].  They recommended surgical referral for hyperparathyroidism.  They ordered referral, but patient did not follow-up.   Past Medical History:  Diagnosis Date   Asthma    no problem in last year   COLONIC POLYPS, HX OF 11/06/2009   Cough    nonproductive    DYSPNEA ON EXERTION 05/06/2008   GERD 06/18/2007   Goiter    hx of 1973 removed   HYPERCHOLESTEROLEMIA, PURE 07/09/2007   HYPERTENSION 06/18/2007   HYPOTHYROIDISM 06/18/2007   Insomnia    NEPHROLITHIASIS, HX OF 08/07/2008   Obesity    OSTEOARTHRITIS 05/07/2008   Renal cyst    RENAL INSUFFICIENCY 05/06/2008   Past Surgical History:  Procedure Laterality Date   ABDOMINAL HYSTERECTOMY     partial   CARPAL TUNNEL RELEASE Right 12/31/2019   Procedure: CARPAL TUNNEL RELEASE ENDOSCOPIC;  Surgeon: Renette Butters, MD;  Location: [facility];  Service: Orthopedics;  Laterality: Right;   CATARACT EXTRACTION Bilateral    JOINT REPLACEMENT      bilateral knees   KNEE ARTHROSCOPY Left    LITHOTRIPSY     NASAL SINUS SURGERY  1990   SINUS ENDO W/FUSION Right 11/06/2015   Procedure: ENDOSCOPIC SINUS SURGERY WITH NAVIGATION;  Surgeon: Melissa Montane, MD;  Location: [facility] OR;  Service: ENT;  Laterality: Right;  Endoscopic sinus surgery with Fusion protocol, right frontal and ethmoid sinusotomy    TONSILLECTOMY     TOTAL KNEE ARTHROPLASTY     No Known Allergies No outpatient medications have been marked as taking for the 01/28/22 encounter (Office Visit) with Caren Macadam, MD.   Social History   Tobacco Use   Smoking status: Former    Packs/day: 0.25    Years: 6.00    Pack years: 1.50    Types: Cigarettes    Quit date: 12/19/1978    Years since quitting: 43.1   Smokeless tobacco: Never  Substance Use Topics   Alcohol use: Yes    Alcohol/week: 0.0 standard drinks    Comment: rare   Family History  Problem Relation Age of Onset   Heart Problems Father    Multiple myeloma Other    Colon cancer Other    Hypertension Neg Hx        family   Cancer Neg Hx        colon ca , prostate ca     Review of Systems  Constitutional:  Negative for activity change, appetite change, chills,  fatigue, fever and unexpected weight change.  HENT:  Negative for congestion, ear pain, hearing loss, sinus pressure, sinus pain, sore throat and trouble swallowing.   Eyes:  Negative for pain and visual disturbance.  Respiratory:  Negative for cough, chest tightness, shortness of breath and wheezing.   Cardiovascular:  Negative for chest pain, palpitations and leg swelling.  Gastrointestinal:  Negative for abdominal pain, blood in stool, constipation, diarrhea, nausea and vomiting.  Genitourinary:  Negative for difficulty urinating and menstrual problem.  Musculoskeletal:  Negative for arthralgias and back pain.  Skin:  Negative for rash.  Neurological:  Negative for dizziness, weakness, numbness and headaches.  Hematological:  Negative for  adenopathy. Does not bruise/bleed easily.  Psychiatric/Behavioral:  Negative for sleep disturbance and suicidal ideas. The patient is not nervous/anxious.    CBC:  Lab Results  Component Value Date   WBC 5.9 04/28/2021   HGB 14.9 04/28/2021   HCT 44.7 04/28/2021   MCH 28.3 07/29/2020   MCHC 33.4 04/28/2021   RDW 16.1 (H) 04/28/2021   PLT 202.0 04/28/2021   MPV 10.2 07/29/2020   MPV 7.1 12/16/2016   CMP: Lab Results  Component Value Date   NA 141 04/28/2021   K 4.1 04/28/2021   CL 106 04/28/2021   CO2 26 04/28/2021   ANIONGAP 6 10/29/2015   GLUCOSE 100 (H) 04/28/2021   GLUCOSE 99 10/27/2006   BUN 21 04/28/2021   CREATININE 1.52 (H) 04/28/2021   CREATININE 1.42 (H) 11/25/2020   GFRAA 32 (L) 11/06/2015   CALCIUM 10.3 04/28/2021   CALCIUM 10.3 04/28/2021   PROT 5.9 (L) 04/28/2021   BILITOT 0.7 04/28/2021   ALKPHOS 137 (H) 04/28/2021   ALT 8 04/28/2021   AST 12 04/28/2021   LIPID: Lab Results  Component Value Date   CHOL 194 04/28/2021   TRIG 131.0 04/28/2021   TRIG 138 10/27/2006   HDL 60.20 04/28/2021   LDLCALC 108 (H) 04/28/2021   LDLCALC 133 (H) 07/29/2020    Objective:  BP 110/68    Pulse 83    Temp 97.6 F (36.4 C) (Oral)    Ht 5' 3.75" (1.619 m)    Wt 232 lb 14.4 oz (105.6 kg)    SpO2 96%    BMI 40.29 kg/m   Weight: 232 lb 14.4 oz (105.6 kg)   BP Readings from Last 3 Encounters:  01/28/22 110/68  07/28/21 (!) 132/52  06/09/21 140/80   Wt Readings from Last 3 Encounters:  01/28/22 232 lb 14.4 oz (105.6 kg)  07/28/21 232 lb 6.4 oz (105.4 kg)  06/09/21 230 lb 3.2 oz (104.4 kg)    Physical Exam Constitutional:      General: She is not in acute distress.    Appearance: She is well-developed. She is obese.  HENT:     Head: Normocephalic and atraumatic.     Right Ear: External ear normal.     Left Ear: External ear normal.     Mouth/Throat:     Pharynx: No oropharyngeal exudate.  Eyes:     Conjunctiva/sclera: Conjunctivae normal.     Pupils:  Pupils are equal, round, and reactive to light.  Neck:     Thyroid: No thyromegaly.  Cardiovascular:     Rate and Rhythm: Normal rate and regular rhythm.     Heart sounds: Normal heart sounds. No murmur heard.   No friction rub. No gallop.  Pulmonary:     Effort: Pulmonary effort is normal.     Breath sounds: Normal  breath sounds.  Abdominal:     General: Bowel sounds are normal. There is no distension.     Palpations: Abdomen is soft. There is no mass.     Tenderness: There is no abdominal tenderness. There is no guarding.     Hernia: No hernia is present.  Musculoskeletal:        General: No tenderness or deformity. Normal range of motion.     Cervical back: Normal range of motion and neck supple.  Lymphadenopathy:     Cervical: No cervical adenopathy.  Skin:    General: Skin is warm and dry.     Findings: No rash.  Neurological:     Mental Status: She is alert and oriented to person, place, and time.     Deep Tendon Reflexes: Reflexes normal.     Reflex Scores:      Tricep reflexes are 2+ on the right side and 2+ on the left side.      Bicep reflexes are 2+ on the right side and 2+ on the left side.      Brachioradialis reflexes are 2+ on the right side and 2+ on the left side.      Patellar reflexes are 2+ on the right side and 2+ on the left side. Psychiatric:        Speech: Speech normal.        Behavior: Behavior normal.        Thought Content: Thought content normal.    Assessment/Plan: There are no preventive care reminders to display for this patient. Health Maintenance reviewed.   1. Preventative health care Encouraged some regular exercise.  2. Essential hypertension Well controlled. Confirmed current med list.  - CBC with Differential/Platelet; Future - Comprehensive metabolic panel; Future  3. Mild intermittent asthma without complication Well controlled. Not using inhaler currently. Has at home if needed.   4. Acquired hypothyroidism Not taking  alone or on empty stomach. Consider this with our recheck bloodwork today. - TSH; Future  5. Chronic kidney disease (CKD), stage IV (severe) (HCC) Gave DIL number for [facility]. Call for follow up visit. They referred to surgery for hyper PTH. Will recheck today and consider next step pending these results.  - Comprehensive metabolic panel; Future  6. HYPERCHOLESTEROLEMIA, PURE Simvastatin. Consider dose change pending labs. - Lipid panel; Future  7. Elevated parathyroid hormone - PTH, Intact and Calcium; Future  8. Hyperglycemia - Hemoglobin A1c; Future    Return for pending lab or imaging results.  Micheline Rough, MD

## 2022-01-28 NOTE — Patient Instructions (Addendum)
Schedule appointment with nephrology: [facility] Madelon Lips [phone].   Adult Exercise Classes   [employer]: [email or web address] [phone]) All classes are free, but you have to register. Zoom classes are offered. Variety includes yoga, chair yoga, arthritis classes, Tai Chi, line dancing, balance classes, boot camp, and more!   [place] resources from [employer]" (gathering place for friends with dementia and caregivers to have social time, coffee, treats) to exercise classes. All free! PBS  Daily chair exercises/Yoga classes

## 2022-01-31 LAB — PTH, INTACT AND CALCIUM
Calcium: 10.5 mg/dL — ABNORMAL HIGH (ref 8.6–10.4)
PTH: 177 pg/mL — ABNORMAL HIGH (ref 16–77)

## 2022-02-03 NOTE — Addendum Note (Signed)
Addended by: Agnes Lawrence on: 02/03/2022 08:35 AM   Modules accepted: Orders

## 2022-02-07 ENCOUNTER — Other Ambulatory Visit: Payer: Self-pay | Admitting: Internal Medicine

## 2022-02-07 ENCOUNTER — Other Ambulatory Visit: Payer: Self-pay | Admitting: Family Medicine

## 2022-02-07 DIAGNOSIS — I10 Essential (primary) hypertension: Secondary | ICD-10-CM

## 2022-02-08 ENCOUNTER — Telehealth: Payer: Self-pay | Admitting: Pharmacist

## 2022-02-08 NOTE — Chronic Care Management (AMB) (Signed)
Chronic Care Management Pharmacy Assistant   Name: [name]  MRN: [id number] DOB: 08-29-1935  Reason for Encounter: Medication Review / Medication Coordination Call   Conditions to be addressed/monitored: HTN   Recent office visits:  01/28/2022 Micheline Rough MD - Patient was seen for preventative health and additional issues. Referral to General Surgery. No medication changes. No follow up noted.   Recent consult visits:  None  Hospital visits:  None  Medications: Outpatient Encounter Medications as of 02/08/2022  Medication Sig Note   Cholecalciferol (VITAMIN D3 PO) Take 2,000 Units by mouth daily.    DULoxetine (CYMBALTA) 30 MG capsule TAKE ONE CAPSULE BY MOUTH BEFORE BREAKFAST    furosemide (LASIX) 40 MG tablet Patient reports taking three times a week 01/26/2016: Received from: External Pharmacy   levothyroxine (SYNTHROID) 150 MCG tablet TAKE 1 AND 1/2 TABLETS BY MOUTH ONCE WEEKLY BEFORE BREAKFAST ON MONDAY and TAKE ONE TABLET BY MOUTH ONCE WEEKLY BEFOR BREAKFAST ON  TUESDAYS and TAKE ONE TABLET BY MOUTH ONCE WEEKLY BEFORE BREAKFAST ON WEDNESDAYS and TAKE ONE TABLET BY MOUTH ONCE WEEKLY BEFORE BREAKFAST ON THURSDAYS and TAKE ONE TABLET BY MOUTH ONCE WEEKLY BEFORE BREAKFAST ON FRIDAYS and TAKE ONE TABLET BY MOUTH ONCE WEEKLY BEFORE BREAKFAST ON SATURDAY and TAKE ONE TABLET BY MOUTH ONCE WEEKLY BEFORE BREAKFAST ON SUNDAYS    metoprolol tartrate (LOPRESSOR) 25 MG tablet Take 1 tablet (25 mg total) by mouth 2 (two) times daily.    mirabegron ER (MYRBETRIQ) 50 MG TB24 tablet Take 1 tablet (50 mg total) by mouth daily.    montelukast (SINGULAIR) 10 MG tablet TAKE ONE TABLET BY MOUTH BEFORE BREAKFAST    simvastatin (ZOCOR) 20 MG tablet TAKE ONE TABLET BY MOUTH EVERYDAY AT BEDTIME    SYMBICORT 80-4.5 MCG/ACT inhaler INHALE TWO PUFFS BY MOUTH INTO LUNGS TWICE DAILY    No facility-administered encounter medications on file as of 02/08/2022.   Reviewed chart for medication changes  ahead of medication coordination call.  No OVs, Consults, or hospital visits since last care coordination call/Pharmacist visit. (If appropriate, list visit date, provider name)  No medication changes indicated OR if recent visit, treatment plan here.  BP Readings from Last 3 Encounters:  01/28/22 110/68  07/28/21 (!) 132/52  06/09/21 140/80    Lab Results  Component Value Date   HGBA1C 5.1 01/28/2022     Patient obtains medications through Adherence Packaging  30 Days    Last adherence delivery included:  Duloxetine 30 mg: one tablet before breakfast Levothyroxine 150 mcg: one and half (1.5) tablet before breakfast on Mondays and 1 tablet before breakfast on all other days Furosemide 40 mg: one tablet before breakfast Monday, Wednesday, and Friday Montelukast 10 mg: one tablet before breakfast Simvastatin 20 mg:one tablet at bedtime Myrbetriq 25 mg:one tablet at bedtime Vitamin D 61mcg: one tablet at bedtime Metoprolol tartrate 25mg  - one tablet before breakfast and one tablet at bedtime. Symbicort 80/4.5- 2 puffs into the lungs twice daily.    Patient declined (meds) last month: No declined medications     Patient is due for next adherence delivery on: 02/18/2022   Reviewed medications and coordinated delivery.   This delivery to include: Duloxetine 30 mg: one tablet before breakfast Levothyroxine 150 mcg: one and half (1.5) tablet before breakfast on Mondays and 1 tablet before breakfast on all other days Furosemide 40 mg: one tablet before breakfast Monday, Wednesday, and Friday Montelukast 10 mg: one tablet before breakfast Simvastatin 20 mg:one tablet  at bedtime Myrbetriq 50 mg:one tablet at bedtime Vitamin D 52mcg (2000 iu): one tablet at bedtime Metoprolol tartrate 25mg  - one tablet before breakfast and one tablet at bedtime. Symbicort 80/4.5- 2 puffs into the lungs twice daily.    Patient will need a short fill:   Coordinated acute fill:   Patient declined  the following medications:   Unable to reach patient after 3 attempts to confirmed delivery date of 02/18/2022     Care Gaps: AWV - scheduled for 11/19/21 Last BP - 110/68 on 01/28/2022 Last A1C - 5.1 on 01/28/2022   Star Rating Drugs: Simvastatin 20mg  - last filled on 01/14/2022 30DS at [facility] [phone]

## 2022-02-10 DIAGNOSIS — F0390 Unspecified dementia without behavioral disturbance: Secondary | ICD-10-CM | POA: Diagnosis not present

## 2022-02-10 DIAGNOSIS — E213 Hyperparathyroidism, unspecified: Secondary | ICD-10-CM | POA: Diagnosis not present

## 2022-02-10 DIAGNOSIS — E039 Hypothyroidism, unspecified: Secondary | ICD-10-CM | POA: Diagnosis not present

## 2022-02-10 DIAGNOSIS — E669 Obesity, unspecified: Secondary | ICD-10-CM | POA: Diagnosis not present

## 2022-02-10 DIAGNOSIS — N1832 Chronic kidney disease, stage 3b: Secondary | ICD-10-CM | POA: Diagnosis not present

## 2022-02-15 DIAGNOSIS — E890 Postprocedural hypothyroidism: Secondary | ICD-10-CM | POA: Diagnosis not present

## 2022-02-15 DIAGNOSIS — E210 Primary hyperparathyroidism: Secondary | ICD-10-CM | POA: Diagnosis not present

## 2022-02-18 ENCOUNTER — Other Ambulatory Visit: Payer: Self-pay | Admitting: Surgery

## 2022-02-18 DIAGNOSIS — E0590 Thyrotoxicosis, unspecified without thyrotoxic crisis or storm: Secondary | ICD-10-CM

## 2022-03-01 DIAGNOSIS — Z961 Presence of intraocular lens: Secondary | ICD-10-CM | POA: Diagnosis not present

## 2022-03-09 ENCOUNTER — Other Ambulatory Visit: Payer: Self-pay | Admitting: Family Medicine

## 2022-03-09 ENCOUNTER — Telehealth: Payer: Self-pay | Admitting: Pharmacist

## 2022-03-09 NOTE — Chronic Care Management (AMB) (Signed)
? ? ?Chronic Care Management ?Pharmacy Assistant  ? ?Name: [name]  MRN: [id number] DOB: July 07, 1935 ? ?Reason for Encounter: Medication Review / Medication Coordination Call ?  ?Conditions to be addressed/monitored: ?HTN ? ?Recent office visits:  ?None ? ?Recent consult visits:  ?02/15/2022 Armandina Gemma MD (Gen Surgery) - Patient was seen for Primary hyperparathyroidism. No medication changes. No follow up noted.  ? ?Hospital visits:  ?None ? ?Medications: ?Outpatient Encounter Medications as of 03/09/2022  ?Medication Sig Note  ? Cholecalciferol (VITAMIN D3 PO) Take 2,000 Units by mouth daily.   ? DULoxetine (CYMBALTA) 30 MG capsule TAKE ONE CAPSULE BY MOUTH BEFORE BREAKFAST   ? furosemide (LASIX) 40 MG tablet Patient reports taking three times a week 01/26/2016: Received from: External Pharmacy  ? levothyroxine (SYNTHROID) 150 MCG tablet TAKE 1 AND 1/2 TABLETS BY MOUTH ONCE WEEKLY BEFORE BREAKFAST ON MONDAY and TAKE ONE TABLET BY MOUTH ONCE WEEKLY BEFOR BREAKFAST ON  TUESDAYS and TAKE ONE TABLET BY MOUTH ONCE WEEKLY BEFORE BREAKFAST ON WEDNESDAYS and TAKE ONE TABLET BY MOUTH ONCE WEEKLY BEFORE BREAKFAST ON THURSDAYS and TAKE ONE TABLET BY MOUTH ONCE WEEKLY BEFORE BREAKFAST ON FRIDAYS and TAKE ONE TABLET BY MOUTH ONCE WEEKLY BEFORE BREAKFAST ON SATURDAY and TAKE ONE TABLET BY MOUTH ONCE WEEKLY BEFORE BREAKFAST ON SUNDAYS   ? metoprolol tartrate (LOPRESSOR) 25 MG tablet TAKE ONE TABLET BY MOUTH BEFORE BREAKFAST and TAKE ONE TABLET BY MOUTH EVERYDAY AT BEDTIME   ? mirabegron ER (MYRBETRIQ) 50 MG TB24 tablet Take 1 tablet (50 mg total) by mouth daily.   ? montelukast (SINGULAIR) 10 MG tablet TAKE ONE TABLET BY MOUTH BEFORE BREAKFAST   ? simvastatin (ZOCOR) 20 MG tablet TAKE ONE TABLET BY MOUTH EVERYDAY AT BEDTIME   ? SYMBICORT 80-4.5 MCG/ACT inhaler INHALE TWO PUFFS BY MOUTH INTO LUNGS TWICE DAILY   ? ?No facility-administered encounter medications on file as of 03/09/2022.  ?Reviewed chart for medication changes ahead  of medication coordination call. ? ?No OVs, Consults, or hospital visits since last care coordination call/Pharmacist visit. (If appropriate, list visit date, provider name) ? ?No medication changes indicated OR if recent visit, treatment plan here. ? ?BP Readings from Last 3 Encounters:  ?01/28/22 110/68  ?07/28/21 (!) 132/52  ?06/09/21 140/80  ?  ?Lab Results  ?Component Value Date  ? HGBA1C 5.1 01/28/2022  ?  ? ?Patient obtains medications through Adherence Packaging  30 Days  ?  ?Last adherence delivery included:  ?Duloxetine 30 mg: one tablet before breakfast ?Levothyroxine 150 mcg: one and half (1.5) tablet before breakfast on Mondays and 1 tablet before breakfast on all other days ?Furosemide 40 mg: one tablet before breakfast Monday, Wednesday, and Friday ?Montelukast 10 mg: one tablet before breakfast ?Simvastatin 20 mg:one tablet at bedtime ?Myrbetriq 50 mg:one tablet at bedtime ?Vitamin D 51mg (2000 iu): one tablet at bedtime ?Metoprolol tartrate '25mg'$  - one tablet before breakfast and one tablet at bedtime. ?Symbicort 80/4.5- 2 puffs into the lungs twice daily.  ?  ?Patient declined (meds) last month: No declined medications, unable to reach patient ?  ?  ? SCHEDULE FOLLOW UP ?Patient is due for next adherence delivery on: 03/22/2022 ?  ?Reviewed medications and coordinated delivery. ?  ?This delivery to include: ?Duloxetine 30 mg: one tablet before breakfast ?Levothyroxine 150 mcg: one and half (1.5) tablet before breakfast on Mondays and 1 tablet before breakfast on all other days ?Furosemide 40 mg: one tablet before breakfast Monday, Wednesday, and Friday ?Montelukast 10 mg: one  tablet before breakfast ?Simvastatin 20 mg:one tablet at bedtime ?Myrbetriq 50 mg:one tablet at bedtime ?Vitamin D 6mg (2000 iu): one tablet at bedtime ?Metoprolol tartrate '25mg'$  - one tablet before breakfast and one tablet at bedtime. ?Symbicort 80/4.5- 2 puffs into the lungs twice daily.  ?  ?Patient will need a short  fill: ?  ?Coordinated acute fill: ?  ?Patient declined the following medications: ?  ?____________ delivery date of 03/22/2022 ?  ?  ?Care Gaps: ?AWV - message sent to RRamond Craver?Last BP - 110/68 on 01/28/2022 ?Last A1C - 5.1 on 01/28/2022 ?Covid booster - overdue ?  ?Star Rating Drugs: ?Simvastatin '20mg'$  - last filled on 02/14/2022 30DS at [facility] ? ?JGennie AlmaCMA  ?Clinical Pharmacist Assistant ?[phone]? ?

## 2022-03-14 ENCOUNTER — Ambulatory Visit
Admission: RE | Admit: 2022-03-14 | Discharge: 2022-03-14 | Disposition: A | Discharge status: Home / Self Care | Payer: PPO | Source: Ambulatory Visit | Attending: Surgery | Admitting: Surgery

## 2022-03-14 DIAGNOSIS — E0590 Thyrotoxicosis, unspecified without thyrotoxic crisis or storm: Secondary | ICD-10-CM

## 2022-03-16 ENCOUNTER — Telehealth: Payer: Self-pay | Admitting: Family Medicine

## 2022-03-16 NOTE — Telephone Encounter (Signed)
Spoke with patient daughter in law (cindy) she will  call back to schedule Medicare Annual Wellness Visit (AWV) either virtually or in office.  ? ? ?Last AWV ;11/18/20 ? please schedule at anytime with [facility] Nurse Health Advisor 1 or 2 ? ? ?  ?

## 2022-03-21 ENCOUNTER — Ambulatory Visit (INDEPENDENT_AMBULATORY_CARE_PROVIDER_SITE_OTHER): Payer: PPO

## 2022-03-21 VITALS — Ht 63.0 in | Wt 223.0 lb

## 2022-03-21 DIAGNOSIS — Z0000 Encounter for general adult medical examination without abnormal findings: Secondary | ICD-10-CM

## 2022-03-21 NOTE — Patient Instructions (Addendum)
?[name] , ?Thank you for taking time to come for your Medicare Wellness Visit. I appreciate your ongoing commitment to your health goals. Please review the following plan we discussed and let me know if I can assist you in the future.  ? ?These are the goals we discussed: ? Goals   ? ?   Patient Stated (pt-stated)   ?   I would like to continue to share my daughters dog ?  ? ?  ?  ?This is a list of the screening recommended for you and due dates:  ?Health Maintenance  ?Topic Date Due  ? COVID-19 Vaccine (4 - Booster for Pfizer series) 04/06/2022*  ? Mammogram  04/27/2022*  ? Tetanus Vaccine  04/27/2022*  ? Flu Shot  07/19/2022  ? Pneumonia Vaccine  Completed  ? DEXA scan (bone density measurement)  Completed  ? Zoster (Shingles) Vaccine  Completed  ? HPV Vaccine  Aged Out  ?*Topic was postponed. The date shown is not the original due date.  ? ?Advanced directives: Yes  ? ?Conditions/risks identified: None ? ?Next appointment: Follow up in one year for your annual wellness visit  ? ? ?Preventive Care 23 Years and Older, Female ?Preventive care refers to lifestyle choices and visits with your health care provider that can promote health and wellness. ?What does preventive care include? ?A yearly physical exam. This is also called an annual well check. ?Dental exams once or twice a year. ?Routine eye exams. Ask your health care provider how often you should have your eyes checked. ?Personal lifestyle choices, including: ?Daily care of your teeth and gums. ?Regular physical activity. ?Eating a healthy diet. ?Avoiding tobacco and drug use. ?Limiting alcohol use. ?Practicing safe sex. ?Taking low-dose aspirin every day. ?Taking vitamin and mineral supplements as recommended by your health care provider. ?What happens during an annual well check? ?The services and screenings done by your health care provider during your annual well check will depend on your age, overall health, lifestyle risk factors, and family  history of disease. ?Counseling  ?Your health care provider may ask you questions about your: ?Alcohol use. ?Tobacco use. ?Drug use. ?Emotional well-being. ?Home and relationship well-being. ?Sexual activity. ?Eating habits. ?History of falls. ?Memory and ability to understand (cognition). ?Work and work Statistician. ?Reproductive health. ?Screening  ?You may have the following tests or measurements: ?Height, weight, and BMI. ?Blood pressure. ?Lipid and cholesterol levels. These may be checked every 5 years, or more frequently if you are over 56 years old. ?Skin check. ?Lung cancer screening. You may have this screening every year starting at age 31 if you have a 30-pack-year history of smoking and currently smoke or have quit within the past 15 years. ?Fecal occult blood test (FOBT) of the stool. You may have this test every year starting at age 85. ?Flexible sigmoidoscopy or colonoscopy. You may have a sigmoidoscopy every 5 years or a colonoscopy every 10 years starting at age 42. ?Hepatitis C blood test. ?Hepatitis B blood test. ?Sexually transmitted disease (STD) testing. ?Diabetes screening. This is done by checking your blood sugar (glucose) after you have not eaten for a while (fasting). You may have this done every 1-3 years. ?Bone density scan. This is done to screen for osteoporosis. You may have this done starting at age 6. ?Mammogram. This may be done every 1-2 years. Talk to your health care provider about how often you should have regular mammograms. ?Talk with your health care provider about your test results, treatment options,  and if necessary, the need for more tests. ?Vaccines  ?Your health care provider may recommend certain vaccines, such as: ?Influenza vaccine. This is recommended every year. ?Tetanus, diphtheria, and acellular pertussis (Tdap, Td) vaccine. You may need a Td booster every 10 years. ?Zoster vaccine. You may need this after age 66. ?Pneumococcal 13-valent conjugate (PCV13)  vaccine. One dose is recommended after age 42. ?Pneumococcal polysaccharide (PPSV23) vaccine. One dose is recommended after age 16. ?Talk to your health care provider about which screenings and vaccines you need and how often you need them. ?This information is not intended to replace advice given to you by your health care provider. Make sure you discuss any questions you have with your health care provider. ?Document Released: 01/01/2016 Document Revised: 08/24/2016 Document Reviewed: 10/06/2015 ?Elsevier Interactive Patient Education ? 2017 [employer]. ? ?Fall Prevention in the Home ?Falls can cause injuries. They can happen to people of all ages. There are many things you can do to make your home safe and to help prevent falls. ?What can I do on the outside of my home? ?Regularly fix the edges of walkways and driveways and fix any cracks. ?Remove anything that might make you trip as you walk through a door, such as a raised step or threshold. ?Trim any bushes or trees on the path to your home. ?Use bright outdoor lighting. ?Clear any walking paths of anything that might make someone trip, such as rocks or tools. ?Regularly check to see if handrails are loose or broken. Make sure that both sides of any steps have handrails. ?Any raised decks and porches should have guardrails on the edges. ?Have any leaves, snow, or ice cleared regularly. ?Use sand or salt on walking paths during winter. ?Clean up any spills in your garage right away. This includes oil or grease spills. ?What can I do in the bathroom? ?Use night lights. ?Install grab bars by the toilet and in the tub and shower. Do not use towel bars as grab bars. ?Use non-skid mats or decals in the tub or shower. ?If you need to sit down in the shower, use a plastic, non-slip stool. ?Keep the floor dry. Clean up any water that spills on the floor as soon as it happens. ?Remove soap buildup in the tub or shower regularly. ?Attach bath mats securely with  double-sided non-slip rug tape. ?Do not have throw rugs and other things on the floor that can make you trip. ?What can I do in the bedroom? ?Use night lights. ?Make sure that you have a light by your bed that is easy to reach. ?Do not use any sheets or blankets that are too big for your bed. They should not hang down onto the floor. ?Have a firm chair that has side arms. You can use this for support while you get dressed. ?Do not have throw rugs and other things on the floor that can make you trip. ?What can I do in the kitchen? ?Clean up any spills right away. ?Avoid walking on wet floors. ?Keep items that you use a lot in easy-to-reach places. ?If you need to reach something above you, use a strong step stool that has a grab bar. ?Keep electrical cords out of the way. ?Do not use floor polish or wax that makes floors slippery. If you must use wax, use non-skid floor wax. ?Do not have throw rugs and other things on the floor that can make you trip. ?What can I do with my stairs? ?Do not leave  any items on the stairs. ?Make sure that there are handrails on both sides of the stairs and use them. Fix handrails that are broken or loose. Make sure that handrails are as long as the stairways. ?Check any carpeting to make sure that it is firmly attached to the stairs. Fix any carpet that is loose or worn. ?Avoid having throw rugs at the top or bottom of the stairs. If you do have throw rugs, attach them to the floor with carpet tape. ?Make sure that you have a light switch at the top of the stairs and the bottom of the stairs. If you do not have them, ask someone to add them for you. ?What else can I do to help prevent falls? ?Wear shoes that: ?Do not have high heels. ?Have rubber bottoms. ?Are comfortable and fit you well. ?Are closed at the toe. Do not wear sandals. ?If you use a stepladder: ?Make sure that it is fully opened. Do not climb a closed stepladder. ?Make sure that both sides of the stepladder are locked  into place. ?Ask someone to hold it for you, if possible. ?Clearly mark and make sure that you can see: ?Any grab bars or handrails. ?First and last steps. ?Where the edge of each step is. ?Use tools that help you move ar

## 2022-03-21 NOTE — Progress Notes (Signed)
? ?Subjective:  ? [name] is a 87 y.o. female who presents for Medicare Annual (Subsequent) preventive examination. ? ?Review of Systems    ?Virtual Visit via Telephone Note ? ?I connected with  [name] on 03/21/22 at  2:45 PM EDT by telephone and verified that I am speaking with the correct person using two identifiers. ? ?Location: ?Patient: Home ?Provider: Office ?Persons participating in the virtual visit: patient/Nurse Health Advisor ?  ?I discussed the limitations, risks, security and privacy concerns of performing an evaluation and management service by telephone and the availability of in person appointments. The patient expressed understanding and agreed to proceed. ? ?Interactive audio and video telecommunications were attempted between this nurse and patient, however failed, due to patient having technical difficulties OR patient did not have access to video capability.  We continued and completed visit with audio only. ? ?Some vital signs may be absent or patient reported.  ? ?Sherri Peaches, LPN  ?Cardiac Risk Factors include: advanced age (>69men, >41 women);hypertension ? ?   ?Objective:  ?  ?Today's Vitals  ? 03/21/22 1446  ?Weight: 223 lb (101.2 kg)  ?Height: $RemoveB'5\' 3"'bawFCvBs$  (1.6 m)  ? ?Body mass index is 39.5 kg/m?. ? ? ?  03/21/2022  ?  3:03 PM 11/18/2020  ? 10:07 AM 12/31/2019  ?  7:22 AM 11/06/2015  ?  4:19 PM 10/29/2015  ? 12:11 PM 05/14/2015  ? 11:02 AM 04/09/2015  ? 11:36 AM  ?Advanced Directives  ?Does Patient Have a Medical Advance Directive? Yes Yes Yes Yes Yes Yes No  ?Type of Paramedic of [place];Living will [place];Living will [place];Living will   Living will;Out of facility DNR (pink MOST or yellow form)   ?Does patient want to make changes to medical advance directive? No - Patient declined No - Patient declined       ?Copy of [place] in Chart? No - copy requested No - copy requested No - copy requested   No - copy requested    ? ? ?Current Medications (verified) ?Outpatient Encounter Medications as of 03/21/2022  ?Medication Sig  ? Cholecalciferol (VITAMIN D3 PO) Take 2,000 Units by mouth daily.  ? DULoxetine (CYMBALTA) 30 MG capsule TAKE ONE CAPSULE BY MOUTH BEFORE BREAKFAST  ? furosemide (LASIX) 40 MG tablet Patient reports taking three times a week  ? levothyroxine (SYNTHROID) 150 MCG tablet TAKE 1 AND 1/2 TABLETS BY MOUTH ONCE WEEKLY BEFORE BREAKFAST ON MONDAY and TAKE ONE TABLET BY MOUTH ONCE WEEKLY BEFOR BREAKFAST ON  TUESDAYS and TAKE ONE TABLET BY MOUTH ONCE WEEKLY BEFORE BREAKFAST ON WEDNESDAYS and TAKE ONE TABLET BY MOUTH ONCE WEEKLY BEFORE BREAKFAST ON THURSDAYS and TAKE ONE TABLET BY MOUTH ONCE WEEKLY BEFORE BREAKFAST ON FRIDAYS and TAKE ONE TABLET BY MOUTH ONCE WEEKLY BEFORE BREAKFAST ON SATURDAY and TAKE ONE TABLET BY MOUTH ONCE WEEKLY BEFORE BREAKFAST ON SUNDAYS  ? metoprolol tartrate (LOPRESSOR) 25 MG tablet TAKE ONE TABLET BY MOUTH BEFORE BREAKFAST and TAKE ONE TABLET BY MOUTH EVERYDAY AT BEDTIME  ? mirabegron ER (MYRBETRIQ) 50 MG TB24 tablet Take 1 tablet (50 mg total) by mouth daily.  ? montelukast (SINGULAIR) 10 MG tablet TAKE ONE TABLET BY MOUTH BEFORE BREAKFAST  ? simvastatin (ZOCOR) 20 MG tablet TAKE ONE TABLET BY MOUTH EVERYDAY AT BEDTIME  ? SYMBICORT 80-4.5 MCG/ACT inhaler INHALE TWO PUFFS BY MOUTH INTO LUNGS twice daily  ? ?No facility-administered encounter medications on file as  of 03/21/2022.  ? ? ?Allergies (verified) ?Patient has no known allergies.  ? ?History: ?Past Medical History:  ?Diagnosis Date  ? Asthma   ? no problem in last year  ? COLONIC POLYPS, HX OF 11/06/2009  ? Cough   ? nonproductive   ? DYSPNEA ON EXERTION 05/06/2008  ? GERD 06/18/2007  ? Goiter   ? hx of 1973 removed  ? HYPERCHOLESTEROLEMIA, PURE 07/09/2007  ? HYPERTENSION 06/18/2007  ? HYPOTHYROIDISM 06/18/2007  ? Insomnia   ? NEPHROLITHIASIS, HX OF 08/07/2008  ? Obesity   ? OSTEOARTHRITIS 05/07/2008  ? Renal cyst   ? RENAL  INSUFFICIENCY 05/06/2008  ? ?Past Surgical History:  ?Procedure Laterality Date  ? ABDOMINAL HYSTERECTOMY    ? partial  ? CARPAL TUNNEL RELEASE Right 12/31/2019  ? Procedure: CARPAL TUNNEL RELEASE ENDOSCOPIC;  Surgeon: Renette Butters, MD;  Location: [facility];  Service: Orthopedics;  Laterality: Right;  ? CATARACT EXTRACTION Bilateral   ? JOINT REPLACEMENT    ? bilateral knees  ? KNEE ARTHROSCOPY Left   ? LITHOTRIPSY    ? NASAL SINUS SURGERY  1990  ? SINUS ENDO W/FUSION Right 11/06/2015  ? Procedure: ENDOSCOPIC SINUS SURGERY WITH NAVIGATION;  Surgeon: Melissa Montane, MD;  [facility];  Service: ENT;  Laterality: Right;  Endoscopic sinus surgery with Fusion protocol, right frontal and ethmoid sinusotomy   ? TONSILLECTOMY    ? TOTAL KNEE ARTHROPLASTY    ? ?Family History  ?Problem Relation Age of Onset  ? Heart Problems Father   ? Multiple myeloma Other   ? Colon cancer Other   ? Hypertension Neg Hx   ?     family  ? Cancer Neg Hx   ?     colon ca , prostate ca  ? ?Social History  ? ?Socioeconomic History  ? Marital status: Widowed  ?  Spouse name: Not on file  ? Number of children: Not on file  ? Years of education: Not on file  ? Highest education level: Not on file  ?Occupational History  ? Occupation: Retired  ?Tobacco Use  ? Smoking status: Former  ?  Packs/day: 0.25  ?  Years: 6.00  ?  Pack years: 1.50  ?  Types: Cigarettes  ?  Quit date: 12/19/1978  ?  Years since quitting: 43.2  ? Smokeless tobacco: Never  ?Vaping Use  ? Vaping Use: Never used  ?Substance and Sexual Activity  ? Alcohol use: Yes  ?  Alcohol/week: 0.0 standard drinks  ?  Comment: rare  ? Drug use: No  ? Sexual activity: Not on file  ?Other Topics Concern  ? Not on file  ?Social History Narrative  ? Not on file  ? ?Social Determinants of Health  ? ?Financial Resource Strain: Low Risk   ? Difficulty of Paying Living Expenses: Not hard at all  ?Food Insecurity: No Food Insecurity  ? Worried About Charity fundraiser in the Last  Year: Never true  ? Ran Out of Food in the Last Year: Never true  ?Transportation Needs: No Transportation Needs  ? Lack of Transportation (Medical): No  ? Lack of Transportation (Non-Medical): No  ?Physical Activity: Inactive  ? Days of Exercise per Week: 0 days  ? Minutes of Exercise per Session: 0 min  ?Stress: No Stress Concern Present  ? Feeling of Stress : Not at all  ?Social Connections: Moderately Integrated  ? Frequency of Communication with Friends and Family: More than three times a  week  ? Frequency of Social Gatherings with Friends and Family: More than three times a week  ? Attends Religious Services: More than 4 times per year  ? Active Member of Clubs or Organizations: Yes  ? Attends Archivist Meetings: More than 4 times per year  ? Marital Status: Widowed  ? ? ?Tobacco Counseling ?Counseling given: Not Answered ? ? ?Clinical Intake: ? ?Pre-visit preparation completed: No ?Diabetic?  No ? ?Interpreter Needed?: NoActivities of Daily Living ? ?  03/21/2022  ?  3:00 PM  ?In your present state of health, do you have any difficulty performing the following activities:  ?Hearing? 0  ?Vision? 0  ?Difficulty concentrating or making decisions? 0  ?Walking or climbing stairs? 0  ?Dressing or bathing? 0  ?Doing errands, shopping? 0  ?Preparing Food and eating ? N  ?Using the Toilet? N  ?In the past six months, have you accidently leaked urine? Y  ?Comment Wears breifs. Followed by PCP  ?Do you have problems with loss of bowel control? N  ?Managing your Medications? N  ?Managing your Finances? N  ?Housekeeping or managing your Housekeeping? N  ? ? ?Patient Care Team: ?Caren Macadam, MD as PCP - General (Family Medicine) ?Jamal Maes, MD as Consulting Physician (Nephrology) ?Viona Gilmore, [facility] as Pharmacist (Pharmacist) ? ?Indicate any recent Medical Services you may have received from other than [facility] providers in the past year (date may be approximate). ? ?   ?Assessment:  ? This is a  routine wellness examination for Brittony. ? ?Hearing/Vision screen ?Hearing Screening - Comments:: No difficulty hearing ?Vision Screening - Comments:: Wears glasses followed by Dr Gershon Crane ? ?Dietary issues and e

## 2022-04-01 ENCOUNTER — Other Ambulatory Visit: Payer: Self-pay | Admitting: Surgery

## 2022-04-01 DIAGNOSIS — E213 Hyperparathyroidism, unspecified: Secondary | ICD-10-CM

## 2022-04-05 ENCOUNTER — Ambulatory Visit (INDEPENDENT_AMBULATORY_CARE_PROVIDER_SITE_OTHER): Payer: PPO | Admitting: Family Medicine

## 2022-04-05 VITALS — BP 120/70 | HR 53 | Temp 97.8°F | Wt 237.0 lb

## 2022-04-05 DIAGNOSIS — I10 Essential (primary) hypertension: Secondary | ICD-10-CM | POA: Diagnosis not present

## 2022-04-05 DIAGNOSIS — N184 Chronic kidney disease, stage 4 (severe): Secondary | ICD-10-CM | POA: Diagnosis not present

## 2022-04-05 LAB — BASIC METABOLIC PANEL
BUN: 31 mg/dL — ABNORMAL HIGH (ref 6–23)
CO2: 32 mEq/L (ref 19–32)
Calcium: 10.6 mg/dL — ABNORMAL HIGH (ref 8.4–10.5)
Chloride: 102 mEq/L (ref 96–112)
Creatinine, Ser: 1.74 mg/dL — ABNORMAL HIGH (ref 0.40–1.20)
GFR: 26.61 mL/min — ABNORMAL LOW (ref >60.00)
Glucose, Bld: 95 mg/dL (ref 70–99)
Potassium: 4.4 mEq/L (ref 3.5–5.1)
Sodium: 141 mEq/L (ref 135–145)

## 2022-04-05 NOTE — Progress Notes (Signed)
? ?  Subjective:  ? ? Patient ID: [name], female    DOB: 26-Apr-1935, 87 y.o.   MRN: [id number] ? ?HPI ?Here with her daughter to discuss leg edema and her kidneys. She is taking Lasix 40 mg once a day. She has CKD stage 3 and she sees Dr. Madelon Lips for this. Her last creatinine in our system on 01-28-22 was 1.71 and the GFR was 27.1. She sees Dr. Harlow Asa for hyperparathyroidism, and he recently ordered a contrasted CT of her neck at [facility]. However on 03-14-22 her creatinine came back at 2.0 and the GFR was 23. The scan was cancelled. She feels well today but she notes that her feet and legs have been swelling more than usual the past few weeks. No leg pain and no SOB. No changes in diet. She had an ECHO in 2016 showing an EF of 60-65%.  ? ? ?Review of Systems  ?Constitutional: Negative.   ?Respiratory: Negative.    ?Cardiovascular:  Positive for leg swelling. Negative for chest pain and palpitations.  ? ?   ?Objective:  ? Physical Exam ?Constitutional:   ?   General: She is not in acute distress. ?   Appearance: Normal appearance.  ?Cardiovascular:  ?   Rate and Rhythm: Normal rate and regular rhythm.  ?   Pulses: Normal pulses.  ?   Heart sounds: Normal heart sounds.  ?Pulmonary:  ?   Effort: Pulmonary effort is normal.  ?   Breath sounds: Normal breath sounds.  ?Musculoskeletal:  ?   Comments: 3+ edema in both ankles   ?Neurological:  ?   Mental Status: She is alert.  ? ? ? ? ? ?   ?Assessment & Plan:  ?She has CKD which appears to be worsening. No signs of CHF. We will check another BMET today. I advised her to elevate the legs as much as she can tolerate, and her daughter plans to get some compression stockings for her to wear. We will increase the Lasix to BID for one week and then go back to once a day. Follow up with Dr. Ethlyn Gallery after that. ?Alysia Penna, MD ? ? ?

## 2022-04-07 ENCOUNTER — Other Ambulatory Visit: Payer: Self-pay | Admitting: Family Medicine

## 2022-04-08 ENCOUNTER — Telehealth: Payer: Self-pay | Admitting: Pharmacist

## 2022-04-08 NOTE — Chronic Care Management (AMB) (Signed)
? ? ?Chronic Care Management ?Pharmacy Assistant  ? ?Name: [name]  MRN: [id number] DOB: 04-29-1935 ? ?Reason for Encounter: Medication Review / Medication Coordination Call ?  ?Conditions to be addressed/monitored: ?HTN ? ?Recent office visits:  ?04/05/2022 Alysia Penna MD - Patient was seen for chronic kidney disease stage 4 and additional issues. No medication changes. No follow up noted. ?  ?03/21/2022 Rolene Arbour LPN - Medicare annual wellness exam.  ? ?Recent consult visits:  ?None ? ?Hospital visits:  ?None ? ?Medications: ?Outpatient Encounter Medications as of 04/08/2022  ?Medication Sig Note  ? Cholecalciferol (VITAMIN D3 PO) Take 2,000 Units by mouth daily.   ? DULoxetine (CYMBALTA) 30 MG capsule TAKE ONE CAPSULE BY MOUTH BEFORE BREAKFAST   ? furosemide (LASIX) 40 MG tablet daily. 01/26/2016: Received from: External Pharmacy  ? levothyroxine (SYNTHROID) 150 MCG tablet TAKE 1 AND 1/2 TABLETS BY MOUTH ONCE WEEKLY BEFORE BREAKFAST ON MONDAY and TAKE ONE TABLET BY MOUTH ONCE WEEKLY BEFOR BREAKFAST ON  TUESDAYS and TAKE ONE TABLET BY MOUTH ONCE WEEKLY BEFORE BREAKFAST ON WEDNESDAYS and TAKE ONE TABLET BY MOUTH ONCE WEEKLY BEFORE BREAKFAST ON THURSDAYS and TAKE ONE TABLET BY MOUTH ONCE WEEKLY BEFORE BREAKFAST ON FRIDAYS and TAKE ONE TABLET BY MOUTH ONCE WEEKLY BEFORE BREAKFAST ON SATURDAY and TAKE ONE TABLET BY MOUTH ONCE WEEKLY BEFORE BREAKFAST ON SUNDAYS   ? metoprolol tartrate (LOPRESSOR) 25 MG tablet TAKE ONE TABLET BY MOUTH BEFORE BREAKFAST and TAKE ONE TABLET BY MOUTH EVERYDAY AT BEDTIME   ? mirabegron ER (MYRBETRIQ) 50 MG TB24 tablet Take 1 tablet (50 mg total) by mouth daily.   ? montelukast (SINGULAIR) 10 MG tablet TAKE ONE TABLET BY MOUTH BEFORE BREAKFAST   ? simvastatin (ZOCOR) 20 MG tablet TAKE ONE TABLET BY MOUTH EVERYDAY AT BEDTIME   ? SYMBICORT 80-4.5 MCG/ACT inhaler INHALE TWO PUFFS BY MOUTH INTO LUNGS twice daily   ? ?No facility-administered encounter medications on file as of 04/08/2022.   ? ?Reviewed chart for medication changes ahead of medication coordination call. ? ?No OVs, Consults, or hospital visits since last care coordination call/Pharmacist visit. (If appropriate, list visit date, provider name) ? ?No medication changes indicated OR if recent visit, treatment plan here. ? ?BP Readings from Last 3 Encounters:  ?04/05/22 120/70  ?01/28/22 110/68  ?07/28/21 (!) 132/52  ?  ?Lab Results  ?Component Value Date  ? HGBA1C 5.1 01/28/2022  ?  ? ?Last adherence delivery included:  ?Duloxetine 30 mg: one tablet before breakfast ?Levothyroxine 150 mcg: one and half (1.5) tablet before breakfast on Mondays and 1 tablet before breakfast on all other days ?Furosemide 40 mg: one tablet before breakfast Monday, Wednesday, and Friday ?Montelukast 10 mg: one tablet before breakfast ?Simvastatin 20 mg:one tablet at bedtime ?Myrbetriq 50 mg:one tablet at bedtime ?Vitamin D 65mg (2000 iu): one tablet at bedtime ?Metoprolol tartrate '25mg'$  - one tablet before breakfast and one tablet at bedtime. ?Symbicort 80/4.5- 2 puffs into the lungs twice daily.  ?  ?Patient declined (meds) last month: No declined medications, unable to reach patient ?  ? ?Patient is due for next adherence delivery on: 04/20/2022 ?  ?Reviewed medications and coordinated delivery. ?  ?This delivery to include: ?Duloxetine 30 mg: one tablet before breakfast ?Levothyroxine 150 mcg: one and half (1.5) tablet before breakfast on Mondays and 1 tablet before breakfast on all other days ?Furosemide 40 mg: one tablet before breakfast Monday, Wednesday, and Friday ?Montelukast 10 mg: one tablet before breakfast ?Simvastatin 20 mg:one tablet  at bedtime ?Vitamin D 65mg (2000 iu): one tablet at bedtime ?Metoprolol tartrate '25mg'$  - one tablet before breakfast and one tablet at bedtime.  ?  ?Patient will need a short fill: No short fill needed ?  ?Coordinated acute fill: No short fill needed ?  ?Patient's daughter declined the following  medications: ?Symbicort 80/4.5- 2 puffs into the lungs twice daily. ?Myrbetriq 50 mg:one tablet at bedtime ?She has plenty on hand ? ?Confirmed delivery date of 04/20/2022 with patients daughter, advised patient that pharmacy will contact them the morning of delivery. ? ? ?Care Gaps: ?AWV - scheduled for 03/23/2023 ?Last BP - 120/70 on 04/05/2022 ?Last A1C - 5.1 on 01/28/2022 ?Covid booster - overdue ?  ?Star Rating [facility]: ?Simvastatin '20mg'$  - last filled on 03/16/2022 30DS at [facility] ? ?JGennie AlmaCMA  ?Clinical Pharmacist Assistant ?[phone]? ?

## 2022-04-18 ENCOUNTER — Telehealth: Payer: Self-pay | Admitting: Family Medicine

## 2022-04-18 NOTE — Telephone Encounter (Signed)
[name] is calling and has viewed blood work on [employer] and would like nurse to return her call she has some questions ?

## 2022-04-19 NOTE — Telephone Encounter (Signed)
I called the patient's daughter, she stated she is busy with work and she will call back with detailed questions. ?

## 2022-04-20 ENCOUNTER — Encounter: Payer: Self-pay | Admitting: Family Medicine

## 2022-04-20 ENCOUNTER — Ambulatory Visit (INDEPENDENT_AMBULATORY_CARE_PROVIDER_SITE_OTHER): Payer: PPO | Admitting: Family Medicine

## 2022-04-20 VITALS — BP 124/70 | HR 62 | Temp 98.1°F | Ht 63.0 in | Wt 239.3 lb

## 2022-04-20 DIAGNOSIS — N184 Chronic kidney disease, stage 4 (severe): Secondary | ICD-10-CM | POA: Diagnosis not present

## 2022-04-20 DIAGNOSIS — R600 Localized edema: Secondary | ICD-10-CM

## 2022-04-20 NOTE — Progress Notes (Signed)
?[name] ?DOB: June 06, 1935 ?Encounter date: 04/20/2022 ? ?This is a 87 y.o. female who presents with ?Chief Complaint  ?Patient presents with  ? Edema  ?  Patient complains of recurrent swelling bilateral LE, left greater than the right x1 week, worse lately  ? ? ?History of present illness: ?Feet and legs are swollen. Taking furosemide in the morning, and has been also taking at lunch time. Doesn't urinate a lot after taking it. Doesn't drink much water.  ? ?Not sure why swelling is worse. No increase in salt intake. Daughter states that she has actually done better with salt intake - was doing PF changs regularly prior to her move into [facility].  ? ?She saw Dr. Hollie Salk, nephrology 11/2020. Sent to gen surgeon who ordered imaging with or without contrast and they wouldn't give her contrast due to elevated creatinine and they had to reschedule but has appointment 5/10 for this.  ? ? ?No Known Allergies ?Current Meds  ?Medication Sig  ? Cholecalciferol (VITAMIN D3 PO) Take 2,000 Units by mouth daily.  ? DULoxetine (CYMBALTA) 30 MG capsule TAKE ONE CAPSULE BY MOUTH BEFORE BREAKFAST  ? furosemide (LASIX) 40 MG tablet daily.  ? levothyroxine (SYNTHROID) 150 MCG tablet TAKE 1 AND 1/2 TABLETS BY MOUTH ONCE WEEKLY BEFORE BREAKFAST ON MONDAY and TAKE ONE TABLET BY MOUTH ONCE WEEKLY BEFORE BREAKFAST ON  TUESDAYS and TAKE ONE TABLET BY MOUTH ONCE WEEKLY BEFORE BREAKFAST ON WEDNESDAYS and TAKE ONE TABLET BY MOUTH ONCE WEEKLY BEFORE BREAKFAST ON THURSDAYS and TAKE ONE TABLET BY MOUTH ONCE WEEKLY BEFORE BREAKFAST ON FRIDAYS and TAKE ONE TABLET BY MOUTH ONCE WEEKLY BEFORE BREAKFAST ON SATURDAY and TAKE ONE TABLET BY MOUTH ONCE WEEKLY BEFORE BREAKFAST ON SUNDAYS  ? metoprolol tartrate (LOPRESSOR) 25 MG tablet TAKE ONE TABLET BY MOUTH BEFORE BREAKFAST and TAKE ONE TABLET BY MOUTH EVERYDAY AT BEDTIME  ? mirabegron ER (MYRBETRIQ) 50 MG TB24 tablet Take 1 tablet (50 mg total) by mouth daily.  ? montelukast (SINGULAIR) 10 MG  tablet TAKE ONE TABLET BY MOUTH BEFORE BREAKFAST  ? simvastatin (ZOCOR) 20 MG tablet TAKE ONE TABLET BY MOUTH EVERYDAY AT BEDTIME  ? SYMBICORT 80-4.5 MCG/ACT inhaler INHALE TWO PUFFS BY MOUTH INTO LUNGS twice daily (Patient taking differently: as needed.)  ? ? ?Review of Systems  ?Constitutional:  Negative for chills, fatigue and fever.  ?Respiratory:  Negative for cough, chest tightness, shortness of breath and wheezing.   ?Cardiovascular:  Negative for chest pain, palpitations and leg swelling.  ? ?Objective: ? ?BP 124/70 (BP Location: Left Arm, Patient Position: Sitting, Cuff Size: Large)   Pulse 62   Temp 98.1 ?F (36.7 ?C) (Oral)   Ht '5\' 3"'$  (1.6 m)   Wt 239 lb 4.8 oz (108.5 kg)   SpO2 98%   BMI 42.39 kg/m?   Weight: 239 lb 4.8 oz (108.5 kg)  ? ?BP Readings from Last 3 Encounters:  ?04/20/22 124/70  ?04/05/22 120/70  ?01/28/22 110/68  ? ?Wt Readings from Last 3 Encounters:  ?04/20/22 239 lb 4.8 oz (108.5 kg)  ?04/05/22 237 lb (107.5 kg)  ?03/21/22 223 lb (101.2 kg)  ? ? ?Physical Exam ?Constitutional:   ?   General: She is not in acute distress. ?   Appearance: She is well-developed.  ?Cardiovascular:  ?   Rate and Rhythm: Normal rate and regular rhythm.  ?   Heart sounds: Normal heart sounds. No murmur heard. ?  No friction rub.  ?Pulmonary:  ?   Effort: Pulmonary  effort is normal. No respiratory distress.  ?   Breath sounds: Normal breath sounds. No wheezing or rales.  ?Musculoskeletal:  ?   Right lower leg: 2+ Pitting Edema present.  ?   Left lower leg: 2+ Pitting Edema present.  ?Neurological:  ?   Mental Status: She is alert and oriented to person, place, and time.  ?Psychiatric:     ?   Behavior: Behavior normal.  ? ? ?Assessment/Plan ? ?1. Chronic kidney disease (CKD), stage IV (severe) (HCC) ?Daughter given nephrology number today to set up follow-up visit.  We will need to continue to monitor, especially in light of her requiring more intense diuresis. ?- BASIC METABOLIC PANEL WITH GFR;  Future ? ?2. Bilateral lower extremity edema ?Discussed elevation of legs above heart level whenever possible, wearing compression stockings daily, increasing Lasix until we hit threshold of diuresis.  We will start with 60 mg twice daily and I will check in with her when she returns for BMP next week.  She was instructed to be well-hydrated for this.  Work on getting in at least 4 glasses of water daily (currently drinking much less than this). ? ?Return for bloodwork on 5/10. ? ? ? ? ? ? ? ? ?Micheline Rough, MD ?

## 2022-04-20 NOTE — Patient Instructions (Addendum)
20-22mHg compression strength stockings - try all heart nursing supply, can also try [employer] (I like [employer]) ? ? ? ?Increase the lasix to 1.5 tablets ('60mg'$  total dose) twice daily. If you are not seeing increased urination after taking that dose, then increase to 2 tablets twice daily. If you don't see increased urination with that then call and let me know.  ?

## 2022-04-27 ENCOUNTER — Ambulatory Visit
Admission: RE | Admit: 2022-04-27 | Discharge: 2022-04-27 | Disposition: A | Discharge status: Home / Self Care | Payer: PPO | Source: Ambulatory Visit | Attending: Surgery | Admitting: Surgery

## 2022-04-27 ENCOUNTER — Other Ambulatory Visit (INDEPENDENT_AMBULATORY_CARE_PROVIDER_SITE_OTHER): Payer: PPO

## 2022-04-27 DIAGNOSIS — N184 Chronic kidney disease, stage 4 (severe): Secondary | ICD-10-CM | POA: Diagnosis not present

## 2022-04-27 DIAGNOSIS — R5383 Other fatigue: Secondary | ICD-10-CM | POA: Diagnosis not present

## 2022-04-27 DIAGNOSIS — E213 Hyperparathyroidism, unspecified: Secondary | ICD-10-CM

## 2022-04-27 DIAGNOSIS — J329 Chronic sinusitis, unspecified: Secondary | ICD-10-CM | POA: Diagnosis not present

## 2022-04-27 LAB — BASIC METABOLIC PANEL
BUN: 30 mg/dL — ABNORMAL HIGH (ref 6–23)
CO2: 30 mEq/L (ref 19–32)
Calcium: 10 mg/dL (ref 8.4–10.5)
Chloride: 102 mEq/L (ref 96–112)
Creatinine, Ser: 1.82 mg/dL — ABNORMAL HIGH (ref 0.40–1.20)
GFR: 25.2 mL/min — ABNORMAL LOW (ref >60.00)
Glucose, Bld: 115 mg/dL — ABNORMAL HIGH (ref 70–99)
Potassium: 3.9 mEq/L (ref 3.5–5.1)
Sodium: 140 mEq/L (ref 135–145)

## 2022-05-02 ENCOUNTER — Telehealth: Payer: Self-pay | Admitting: Family Medicine

## 2022-05-02 DIAGNOSIS — N289 Disorder of kidney and ureter, unspecified: Secondary | ICD-10-CM

## 2022-05-02 NOTE — Telephone Encounter (Signed)
[name] also wanted to let Dr Ethlyn Gallery know Dr Harlow Asa is meeting with the radiologist regarding the parathyroid.  Message sent to PCP. ?

## 2022-05-02 NOTE — Telephone Encounter (Signed)
PT daughter in law cindy is calling and pt lost her furosemide and was taking medication 3x a wk and now taking medication every day. Pt takes 1.5 furosemide  a day  ?[facility] - [place], [place] - [address] Phone:  [phone]  ?Fax:  [phone]  ?  ?Jenny Reichmann would like a call back ?

## 2022-05-02 NOTE — Telephone Encounter (Signed)
Spoke with the patient's daughter in law and she stated she was told by someone at [facility] that a new referral is needed since it has been less than 6 months since her last visit.  New referral was placed and she is aware someone will call with appt info.  ?

## 2022-05-02 NOTE — Telephone Encounter (Signed)
Spoke with Jenny Reichmann, the patient's daughter in law and informed her of the message below.  Jenny Reichmann stated the patient only used '60mg'$  for 10 days, which she thought was the instructions from the last visit and has been taking '40mg'$  three times a week.  Jenny Reichmann stated the patient's swelling has increased and she is urinating normally when she checks the Depends.  Message sent to PCP. ?

## 2022-05-02 NOTE — Telephone Encounter (Signed)
Pt daughter in law is calling and pt needs a referral to nephrologist ?

## 2022-05-02 NOTE — Progress Notes (Signed)
I have reviewed this scan and would like to review it with the radiologist who is assisting me with this case.  I will be in contact with the patient and her daughter regarding further plans once I have his opinion. ? ?tmg ? ?Armandina Gemma, MD ?[facility] ?A [facility] practice ?Office: [phone] ?

## 2022-05-02 NOTE — Telephone Encounter (Signed)
She saw Dr. Hollie Salk with [facility] in February. Not sure why she would need a new referral placed? Please see if you can get more details (ok for referral if needed for insurance or per their practice as it is important for her to maintain regular visits with them) ?

## 2022-05-02 NOTE — Telephone Encounter (Signed)
Please make sure she is getting urine out/swelling is improved with the '60mg'$  daily dose of lasix. If she is, then ok to send in refill (will either need to be '40mg'$  with sig for 1.5 tablets daily or '20mg'$  with sig for 3 tablets daily. When she was last here I had advised BID dosing, but if swelling is better at this point and she has been just doing once a day, she can maintain on that. [place] for 90 day supply with refill. ?

## 2022-05-04 ENCOUNTER — Other Ambulatory Visit: Payer: Self-pay | Admitting: Family Medicine

## 2022-05-04 MED ORDER — FUROSEMIDE 40 MG PO TABS: 60.0000 mg | ORAL_TABLET | Freq: Two times a day (BID) | ORAL | 1 refills | Status: DC

## 2022-05-04 NOTE — Telephone Encounter (Signed)
These were my instructions on last AVS: "Increase the lasix to 1.5 tablets ('60mg'$  total dose) twice daily. If you are ?not seeing increased urination after taking that dose, then increase to 2 tablets twice daily. If you don't see increased urination with that then ?call and let me know." ? ?I don't feel she is getting enough if the swelling is not improving. I'm going to send in enough to take the lasix twice daily. My concern was that even the '60mg'$  may not be helping enough to get more fluid out? She should note increased urination with the '60mg'$ . If not, we need to increase to '80mg'$ . So I will write for '60mg'$  BID for now, but please let me know if she is not getting improvement with swelling in the next few days with this amount. ?

## 2022-05-04 NOTE — Telephone Encounter (Signed)
Spoke with Ms Jenny Reichmann and informed her of the message below.   ?

## 2022-05-09 ENCOUNTER — Telehealth: Payer: Self-pay | Admitting: Pharmacist

## 2022-05-09 DIAGNOSIS — R488 Other symbolic dysfunctions: Secondary | ICD-10-CM | POA: Diagnosis not present

## 2022-05-09 DIAGNOSIS — M6281 Muscle weakness (generalized): Secondary | ICD-10-CM | POA: Diagnosis not present

## 2022-05-09 DIAGNOSIS — R41841 Cognitive communication deficit: Secondary | ICD-10-CM | POA: Diagnosis not present

## 2022-05-09 NOTE — Chronic Care Management (AMB) (Signed)
Chronic Care Management Pharmacy Assistant   Name: [name]  MRN: [id number] DOB: [birth year]  Reason for Encounter: Medication Review / Medication Coordination Call    Conditions to be addressed/monitored: HTN  Recent office visits:  04/20/2022 Micheline Rough MD - Patient was seen for Chronic kidney disease (CKD), stage IV and additional issue. Increase the lasix to 1.5 tablets ('60mg'$  total dose) twice daily. If you are not seeing increased urination after taking that dose, then increase to 2 tablets twice daily. If you don't see increased urination with that then call and let me know. No follow up noted.  Recent consult visits:  None  Hospital visits:  None  Medications: Outpatient Encounter Medications as of 05/09/2022  Medication Sig   Cholecalciferol (VITAMIN D3 PO) Take 2,000 Units by mouth daily.   DULoxetine (CYMBALTA) 30 MG capsule TAKE ONE CAPSULE BY MOUTH BEFORE BREAKFAST   furosemide (LASIX) 40 MG tablet Take 1.5 tablets (60 mg total) by mouth 2 (two) times daily.   levothyroxine (SYNTHROID) 150 MCG tablet TAKE 1 AND 1/2 TABLETS BY MOUTH ONCE WEEKLY BEFORE BREAKFAST ON MONDAY and TAKE ONE TABLET BY MOUTH ONCE WEEKLY BEFORE BREAKFAST ON  TUESDAYS and TAKE ONE TABLET BY MOUTH ONCE WEEKLY BEFORE BREAKFAST ON WEDNESDAYS and TAKE ONE TABLET BY MOUTH ONCE WEEKLY BEFORE BREAKFAST ON THURSDAYS and TAKE ONE TABLET BY MOUTH ONCE WEEKLY BEFORE BREAKFAST ON FRIDAYS and TAKE ONE TABLET BY MOUTH ONCE WEEKLY BEFORE BREAKFAST ON SATURDAY and TAKE ONE TABLET BY MOUTH ONCE WEEKLY BEFORE BREAKFAST ON SUNDAYS   metoprolol tartrate (LOPRESSOR) 25 MG tablet TAKE ONE TABLET BY MOUTH BEFORE BREAKFAST and TAKE ONE TABLET BY MOUTH EVERYDAY AT BEDTIME   mirabegron ER (MYRBETRIQ) 50 MG TB24 tablet Take 1 tablet (50 mg total) by mouth daily.   montelukast (SINGULAIR) 10 MG tablet TAKE ONE TABLET BY MOUTH BEFORE BREAKFAST   simvastatin (ZOCOR) 20 MG tablet TAKE ONE TABLET BY MOUTH EVERYDAY AT  BEDTIME   SYMBICORT 80-4.5 MCG/ACT inhaler INHALE TWO PUFFS BY MOUTH INTO LUNGS twice daily (Patient taking differently: as needed.)   No facility-administered encounter medications on file as of 05/09/2022.   Reviewed chart for medication changes ahead of medication coordination call.  No OVs, Consults, or hospital visits since last care coordination call/Pharmacist visit. (If appropriate, list visit date, provider name)  No medication changes indicated OR if recent visit, treatment plan here.  BP Readings from Last 3 Encounters:  04/20/22 124/70  04/05/22 120/70  01/28/22 110/68    Lab Results  Component Value Date   HGBA1C 5.1 01/28/2022   Reviewed chart prior to disease state call. Spoke with patient regarding BP  Recent Office Vitals: BP Readings from Last 3 Encounters:  04/20/22 124/70  04/05/22 120/70  01/28/22 110/68   Pulse Readings from Last 3 Encounters:  04/20/22 62  04/05/22 (!) 53  01/28/22 83    Wt Readings from Last 3 Encounters:  04/20/22 239 lb 4.8 oz (108.5 kg)  04/05/22 237 lb (107.5 kg)  03/21/22 223 lb (101.2 kg)     Kidney Function Lab Results  Component Value Date/Time   CREATININE 1.82 (H) 04/27/2022 02:42 PM   CREATININE 1.74 (H) 04/05/2022 02:43 PM   CREATININE 1.42 (H) 11/25/2020 11:33 AM   CREATININE 1.59 (H) 07/29/2020 11:52 AM   GFR 25.20 (L) 04/27/2022 02:42 PM   GFRNONAA 27 (L) 11/06/2015 02:00 PM   GFRAA 32 (L) 11/06/2015 02:00 PM       Latest Ref Rng &  Units 04/27/2022    2:42 PM 04/05/2022    2:43 PM 01/28/2022   10:59 AM  BMP  Glucose 70 - 99 mg/dL 115   95   86    BUN 6 - 23 mg/dL '30   31   18    '$ Creatinine 0.40 - 1.20 mg/dL 1.82   1.74   1.71    Sodium 135 - 145 mEq/L 140   141   140    Potassium 3.5 - 5.1 mEq/L 3.9   4.4   4.4    Chloride 96 - 112 mEq/L 102   102   103    CO2 19 - 32 mEq/L 30   32   30    Calcium 8.4 - 10.5 mg/dL 10.0   10.6   10.5     10.7      Current antihypertensive regimen:  Metoprolol 25 mg  twice daily  How often are you checking your Blood Pressure? Patients daughter is not checking blood pressure. She will start checking at least once weekly and recording.   Current home BP readings: No current readings.   What recent interventions/DTPs have been made by any provider to improve Blood Pressure control since last CPP Visit: No recent interventions   Any recent hospitalizations or ED visits since last visit with CPP? No recent hospital visits.   What diet changes have been made to improve Blood Pressure Control?  Patient follows no specific diet.  Patient lives at [facility] and she eats what is being served.   What exercise is being done to improve your Blood Pressure Control?  Patient has been approved for physical therapy and will be starting this soon.   Adherence Review: Is the patient currently on ACE/ARB medication? No Does the patient have >5 day gap between last estimated fill dates? No   Patient obtains medications through Adherence Packaging  30 Days   Last adherence delivery included:  Duloxetine 30 mg: one tablet before breakfast Levothyroxine 150 mcg: one and half (1.5) tablet before breakfast on Mondays and 1 tablet before breakfast on all other days Furosemide 40 mg: one tablet before breakfast Monday, Wednesday, and Friday Montelukast 10 mg: one tablet before breakfast Simvastatin 20 mg:one tablet at bedtime Vitamin D 23mg (2000 iu): one tablet at bedtime Metoprolol tartrate '25mg'$  - one tablet before breakfast and one tablet at bedtime.    Patient's daughter declined (meds) last month:   Symbicort 80/4.5- 2 puffs into the lungs twice daily. Myrbetriq 50 mg:one tablet at bedtime   Patient is due for next adherence delivery on: 05/19/2022   Reviewed medications and coordinated delivery.   This delivery to include: Duloxetine 30 mg: one tablet before breakfast Levothyroxine 150 mcg: one and half (1.5) tablet before breakfast on Mondays and 1  tablet before breakfast on all other days Furosemide 40 mg: Take 1.5 tablets (60 mg total) by mouth 2 (two) times daily (dose and sig change) Montelukast 10 mg: one tablet before breakfast Simvastatin 20 mg:one tablet at bedtime Vitamin D 526m (2000 iu): one tablet at bedtime Metoprolol tartrate '25mg'$  - one tablet before breakfast and one tablet at bedtime.    Patient will need a short fill: No short fill needed   Coordinated acute fill: No short fill needed   Patient's daughter declined the following medications: Symbicort 80/4.5- 2 puffs into the lungs twice daily. Myrbetriq 50 mg:one tablet at bedtime Patient has plenty on hand   Confirmed delivery date of 05/19/2022 with  patients daughter, advised patient that pharmacy will contact them the morning of delivery.   Care Gaps: AWV - scheduled for 03/23/2023 Last BP - 124/70 on 04/20/2022 Last A1C - 5.1 on 01/28/2022 Covid booster - overdue Mammogram - overdue Tdap - overdue  Star Rating Drugs: Simvastatin '20mg'$  - last filled 04/13/2022 30 DS at [place] [phone]

## 2022-05-10 DIAGNOSIS — M6281 Muscle weakness (generalized): Secondary | ICD-10-CM | POA: Diagnosis not present

## 2022-05-11 ENCOUNTER — Other Ambulatory Visit (HOSPITAL_COMMUNITY): Payer: Self-pay | Admitting: Surgery

## 2022-05-11 ENCOUNTER — Other Ambulatory Visit: Payer: Self-pay | Admitting: Surgery

## 2022-05-11 DIAGNOSIS — E210 Primary hyperparathyroidism: Secondary | ICD-10-CM

## 2022-05-11 DIAGNOSIS — E890 Postprocedural hypothyroidism: Secondary | ICD-10-CM

## 2022-05-18 DIAGNOSIS — R488 Other symbolic dysfunctions: Secondary | ICD-10-CM | POA: Diagnosis not present

## 2022-05-18 DIAGNOSIS — R41841 Cognitive communication deficit: Secondary | ICD-10-CM | POA: Diagnosis not present

## 2022-05-19 DIAGNOSIS — M6281 Muscle weakness (generalized): Secondary | ICD-10-CM | POA: Diagnosis not present

## 2022-05-19 HISTORY — PX: PARATHYROIDECTOMY: SHX19

## 2022-05-20 DIAGNOSIS — M6281 Muscle weakness (generalized): Secondary | ICD-10-CM | POA: Diagnosis not present

## 2022-05-23 ENCOUNTER — Other Ambulatory Visit (HOSPITAL_COMMUNITY): Payer: PPO

## 2022-05-24 DIAGNOSIS — R488 Other symbolic dysfunctions: Secondary | ICD-10-CM | POA: Diagnosis not present

## 2022-05-24 DIAGNOSIS — R41841 Cognitive communication deficit: Secondary | ICD-10-CM | POA: Diagnosis not present

## 2022-05-25 DIAGNOSIS — M6281 Muscle weakness (generalized): Secondary | ICD-10-CM | POA: Diagnosis not present

## 2022-05-26 DIAGNOSIS — M6281 Muscle weakness (generalized): Secondary | ICD-10-CM | POA: Diagnosis not present

## 2022-05-27 ENCOUNTER — Ambulatory Visit (HOSPITAL_COMMUNITY)
Admission: RE | Admit: 2022-05-27 | Discharge: 2022-05-27 | Disposition: A | Discharge status: Home / Self Care | Payer: PPO | Source: Ambulatory Visit | Attending: Surgery | Admitting: Surgery

## 2022-05-27 ENCOUNTER — Encounter (HOSPITAL_COMMUNITY)
Admission: RE | Admit: 2022-05-27 | Discharge: 2022-05-27 | Disposition: A | Discharge status: Home / Self Care | Payer: PPO | Source: Ambulatory Visit | Attending: Surgery | Admitting: Surgery

## 2022-05-27 DIAGNOSIS — E210 Primary hyperparathyroidism: Secondary | ICD-10-CM | POA: Insufficient documentation

## 2022-05-27 DIAGNOSIS — E890 Postprocedural hypothyroidism: Secondary | ICD-10-CM | POA: Insufficient documentation

## 2022-05-27 MED ORDER — TECHNETIUM TC 99M SESTAMIBI GENERIC - CARDIOLITE
25.0000 | Freq: Once | INTRAVENOUS | Status: AC | PRN
  Administered 2022-05-27: 25 via INTRAVENOUS

## 2022-05-30 DIAGNOSIS — M6281 Muscle weakness (generalized): Secondary | ICD-10-CM | POA: Diagnosis not present

## 2022-05-31 DIAGNOSIS — R41841 Cognitive communication deficit: Secondary | ICD-10-CM | POA: Diagnosis not present

## 2022-05-31 DIAGNOSIS — R488 Other symbolic dysfunctions: Secondary | ICD-10-CM | POA: Diagnosis not present

## 2022-06-01 DIAGNOSIS — M6281 Muscle weakness (generalized): Secondary | ICD-10-CM | POA: Diagnosis not present

## 2022-06-02 DIAGNOSIS — M6281 Muscle weakness (generalized): Secondary | ICD-10-CM | POA: Diagnosis not present

## 2022-06-03 ENCOUNTER — Ambulatory Visit: Payer: Self-pay | Admitting: Surgery

## 2022-06-03 DIAGNOSIS — M6281 Muscle weakness (generalized): Secondary | ICD-10-CM | POA: Diagnosis not present

## 2022-06-06 DIAGNOSIS — M6281 Muscle weakness (generalized): Secondary | ICD-10-CM | POA: Diagnosis not present

## 2022-06-07 ENCOUNTER — Telehealth: Payer: Self-pay | Admitting: Pharmacist

## 2022-06-07 DIAGNOSIS — M6281 Muscle weakness (generalized): Secondary | ICD-10-CM | POA: Diagnosis not present

## 2022-06-07 DIAGNOSIS — R41841 Cognitive communication deficit: Secondary | ICD-10-CM | POA: Diagnosis not present

## 2022-06-07 DIAGNOSIS — R488 Other symbolic dysfunctions: Secondary | ICD-10-CM | POA: Diagnosis not present

## 2022-06-07 NOTE — Chronic Care Management (AMB) (Cosign Needed)
Chronic Care Management Pharmacy Assistant   Name: [name]  MRN: [id number] DOB: 1935-01-03  Reason for Encounter: Medication Review / Medication Coordination Call   Conditions to be addressed/monitored: HTN  Recent office visits:  None  Recent consult visits:  None  Hospital visits:  None  Medications: Outpatient Encounter Medications as of 06/07/2022  Medication Sig   Cholecalciferol (VITAMIN D3 PO) Take 2,000 Units by mouth daily.   DULoxetine (CYMBALTA) 30 MG capsule TAKE ONE CAPSULE BY MOUTH BEFORE BREAKFAST   furosemide (LASIX) 40 MG tablet Take 1.5 tablets (60 mg total) by mouth 2 (two) times daily.   levothyroxine (SYNTHROID) 150 MCG tablet TAKE 1 AND 1/2 TABLETS BY MOUTH ONCE WEEKLY BEFORE BREAKFAST ON MONDAY and TAKE ONE TABLET BY MOUTH ONCE WEEKLY BEFORE BREAKFAST ON  TUESDAYS and TAKE ONE TABLET BY MOUTH ONCE WEEKLY BEFORE BREAKFAST ON WEDNESDAYS and TAKE ONE TABLET BY MOUTH ONCE WEEKLY BEFORE BREAKFAST ON THURSDAYS and TAKE ONE TABLET BY MOUTH ONCE WEEKLY BEFORE BREAKFAST ON FRIDAYS and TAKE ONE TABLET BY MOUTH ONCE WEEKLY BEFORE BREAKFAST ON SATURDAY and TAKE ONE TABLET BY MOUTH ONCE WEEKLY BEFORE BREAKFAST ON SUNDAYS   metoprolol tartrate (LOPRESSOR) 25 MG tablet TAKE ONE TABLET BY MOUTH BEFORE BREAKFAST and TAKE ONE TABLET BY MOUTH EVERYDAY AT BEDTIME   mirabegron ER (MYRBETRIQ) 50 MG TB24 tablet Take 1 tablet (50 mg total) by mouth daily.   montelukast (SINGULAIR) 10 MG tablet TAKE ONE TABLET BY MOUTH BEFORE BREAKFAST   simvastatin (ZOCOR) 20 MG tablet TAKE ONE TABLET BY MOUTH EVERYDAY AT BEDTIME   SYMBICORT 80-4.5 MCG/ACT inhaler INHALE TWO PUFFS BY MOUTH INTO LUNGS twice daily (Patient taking differently: as needed.)   No facility-administered encounter medications on file as of 06/07/2022.   Reviewed chart for medication changes ahead of medication coordination call.  No OVs, Consults, or hospital visits since last care coordination call/Pharmacist visit.  (If appropriate, list visit date, provider name)  No medication changes indicated OR if recent visit, treatment plan here.  BP Readings from Last 3 Encounters:  04/20/22 124/70  04/05/22 120/70  01/28/22 110/68    Lab Results  Component Value Date   HGBA1C 5.1 01/28/2022     Patient obtains medications through Adherence Packaging  30 Days    Last adherence delivery included:  Duloxetine 30 mg: one tablet before breakfast Levothyroxine 150 mcg: one and half (1.5) tablet before breakfast on Mondays and 1 tablet before breakfast on all other days Furosemide 40 mg: Take 1.5 tablets (60 mg total) by mouth 2 (two) times daily (dose and sig change) Montelukast 10 mg: one tablet before breakfast Simvastatin 20 mg:one tablet at bedtime Vitamin D 42mg (2000 iu): one tablet at bedtime Metoprolol tartrate '25mg'$  - one tablet before breakfast and one tablet at bedtime.    Patient's daughter declined (meds) last month:   Symbicort 80/4.5- 2 puffs into the lungs twice daily. Myrbetriq 50 mg:one tablet at bedtime   Patient is due for next adherence delivery on: 06/20/2022   Reviewed medications and coordinated delivery.   This delivery to include: Furosemide 40 mg: Take 1.5 tablets before breakfast and at dinner Simvastatin 20 mg:one tablet at bedtime Metoprolol tartrate '25mg'$  - 1 tablet before breakfast and 1 tablet at bedtime.  Montelukast 10 mg: one tablet before breakfast Duloxetine 30 mg: one tablet before breakfast Levothyroxine 150 mcg: 1.5 tablet before breakfast on Mondays and 1 tablet before breakfast on all other days Vitamin D 528m (2000 iu): one  tablet at bedtime   Patient will need a short fill: No short fill needed   Coordinated acute fill: No short fill needed   Patient's daughter declined the following medications: Symbicort 80/4.5- 2 puffs into the lungs twice daily. Myrbetriq 50 mg:one tablet at bedtime Patient has plenty on hand   Confirmed delivery date of  06/20/2022 with patients daughter, advised patient that pharmacy will contact them the morning of delivery.  Care Gaps: AWV - scheduled for 03/23/2023 Last BP - 124/70 on 04/20/2022 Last A1C - 5.1 on 01/28/2022 Covid booster - overdue Mammogram - overdue Tdap - overdue  Star Rating Drugs: Simvastatin '20mg'$  - last filled 05/12/2022 30 DS at [place] [phone]

## 2022-06-08 DIAGNOSIS — M6281 Muscle weakness (generalized): Secondary | ICD-10-CM | POA: Diagnosis not present

## 2022-06-09 DIAGNOSIS — M6281 Muscle weakness (generalized): Secondary | ICD-10-CM | POA: Diagnosis not present

## 2022-06-10 DIAGNOSIS — M6281 Muscle weakness (generalized): Secondary | ICD-10-CM | POA: Diagnosis not present

## 2022-06-13 DIAGNOSIS — M6281 Muscle weakness (generalized): Secondary | ICD-10-CM | POA: Diagnosis not present

## 2022-06-14 DIAGNOSIS — R488 Other symbolic dysfunctions: Secondary | ICD-10-CM | POA: Diagnosis not present

## 2022-06-14 DIAGNOSIS — R41841 Cognitive communication deficit: Secondary | ICD-10-CM | POA: Diagnosis not present

## 2022-06-14 DIAGNOSIS — M6281 Muscle weakness (generalized): Secondary | ICD-10-CM | POA: Diagnosis not present

## 2022-06-15 DIAGNOSIS — M6281 Muscle weakness (generalized): Secondary | ICD-10-CM | POA: Diagnosis not present

## 2022-06-16 DIAGNOSIS — M6281 Muscle weakness (generalized): Secondary | ICD-10-CM | POA: Diagnosis not present

## 2022-06-17 DIAGNOSIS — M6281 Muscle weakness (generalized): Secondary | ICD-10-CM | POA: Diagnosis not present

## 2022-06-22 DIAGNOSIS — M6281 Muscle weakness (generalized): Secondary | ICD-10-CM | POA: Diagnosis not present

## 2022-06-23 DIAGNOSIS — M6281 Muscle weakness (generalized): Secondary | ICD-10-CM | POA: Diagnosis not present

## 2022-06-27 DIAGNOSIS — M6281 Muscle weakness (generalized): Secondary | ICD-10-CM | POA: Diagnosis not present

## 2022-06-28 DIAGNOSIS — R488 Other symbolic dysfunctions: Secondary | ICD-10-CM | POA: Diagnosis not present

## 2022-06-28 DIAGNOSIS — M6281 Muscle weakness (generalized): Secondary | ICD-10-CM | POA: Diagnosis not present

## 2022-06-28 DIAGNOSIS — R41841 Cognitive communication deficit: Secondary | ICD-10-CM | POA: Diagnosis not present

## 2022-06-29 DIAGNOSIS — M6281 Muscle weakness (generalized): Secondary | ICD-10-CM | POA: Diagnosis not present

## 2022-07-01 DIAGNOSIS — M6281 Muscle weakness (generalized): Secondary | ICD-10-CM | POA: Diagnosis not present

## 2022-07-04 DIAGNOSIS — M6281 Muscle weakness (generalized): Secondary | ICD-10-CM | POA: Diagnosis not present

## 2022-07-06 ENCOUNTER — Telehealth: Payer: Self-pay | Admitting: Pharmacist

## 2022-07-06 DIAGNOSIS — M6281 Muscle weakness (generalized): Secondary | ICD-10-CM | POA: Diagnosis not present

## 2022-07-06 NOTE — Chronic Care Management (AMB) (Signed)
Chronic Care Management Pharmacy Assistant   Name: [name]  MRN: [id number] DOB: Jul 29, 1935  Reason for Encounter: Medication Review / Medication Coordination Call  Recent office visits:  None  Recent consult visits:  None  Hospital visits:  None  Medications: Outpatient Encounter Medications as of 07/06/2022  Medication Sig   Cholecalciferol (VITAMIN D3 PO) Take 2,000 Units by mouth daily.   DULoxetine (CYMBALTA) 30 MG capsule TAKE ONE CAPSULE BY MOUTH BEFORE BREAKFAST   furosemide (LASIX) 40 MG tablet Take 1.5 tablets (60 mg total) by mouth 2 (two) times daily.   levothyroxine (SYNTHROID) 150 MCG tablet TAKE 1 AND 1/2 TABLETS BY MOUTH ONCE WEEKLY BEFORE BREAKFAST ON MONDAY and TAKE ONE TABLET BY MOUTH ONCE WEEKLY BEFORE BREAKFAST ON  TUESDAYS and TAKE ONE TABLET BY MOUTH ONCE WEEKLY BEFORE BREAKFAST ON WEDNESDAYS and TAKE ONE TABLET BY MOUTH ONCE WEEKLY BEFORE BREAKFAST ON THURSDAYS and TAKE ONE TABLET BY MOUTH ONCE WEEKLY BEFORE BREAKFAST ON FRIDAYS and TAKE ONE TABLET BY MOUTH ONCE WEEKLY BEFORE BREAKFAST ON SATURDAY and TAKE ONE TABLET BY MOUTH ONCE WEEKLY BEFORE BREAKFAST ON SUNDAYS   metoprolol tartrate (LOPRESSOR) 25 MG tablet TAKE ONE TABLET BY MOUTH BEFORE BREAKFAST and TAKE ONE TABLET BY MOUTH EVERYDAY AT BEDTIME   mirabegron ER (MYRBETRIQ) 50 MG TB24 tablet Take 1 tablet (50 mg total) by mouth daily.   montelukast (SINGULAIR) 10 MG tablet TAKE ONE TABLET BY MOUTH BEFORE BREAKFAST   simvastatin (ZOCOR) 20 MG tablet TAKE ONE TABLET BY MOUTH EVERYDAY AT BEDTIME   SYMBICORT 80-4.5 MCG/ACT inhaler INHALE TWO PUFFS BY MOUTH INTO LUNGS twice daily (Patient taking differently: as needed.)   No facility-administered encounter medications on file as of 07/06/2022.  Reviewed chart for medication changes ahead of medication coordination call.  No OVs, Consults, or hospital visits since last care coordination call/Pharmacist visit. (If appropriate, list visit date, provider  name)  No medication changes indicated OR if recent visit, treatment plan here.  BP Readings from Last 3 Encounters:  04/20/22 124/70  04/05/22 120/70  01/28/22 110/68    Lab Results  Component Value Date   HGBA1C 5.1 01/28/2022     Patient obtains medications through Adherence Packaging  30 Days    Last adherence delivery included:  Furosemide 40 mg: Take 1.5 tablets before breakfast and at dinner Simvastatin 20 mg:one tablet at bedtime Metoprolol tartrate '25mg'$  - 1 tablet before breakfast and 1 tablet at bedtime.  Montelukast 10 mg: one tablet before breakfast Duloxetine 30 mg: one tablet before breakfast Levothyroxine 150 mcg: 1.5 tablet before breakfast on Mondays and 1 tablet before breakfast on all other days Vitamin D 1mg (2000 iu): one tablet at bedtime   Patient's daughter declined (meds) last month:   Symbicort 80/4.5- 2 puffs into the lungs twice daily. Myrbetriq 50 mg:one tablet at bedtime   Patient is due for next adherence delivery on: 07/19/2022   Reviewed medications and coordinated delivery.   This delivery to include: Furosemide 40 mg: Take 1.5 tablets before breakfast and at dinner Vitamin D 528m (2000 iu): one tablet at bedtime Simvastatin 20 mg:one tablet at bedtime Metoprolol tartrate '25mg'$  - 1 tablet before breakfast and 1 tablet at bedtime.  Levothyroxine 150 mcg: 1.5 tablet before breakfast on Mondays and 1 tablet before breakfast on all other days Montelukast 10 mg: one tablet before breakfast Duloxetine 30 mg: one tablet before breakfast  Patient will need a short fill: No short fill needed   Coordinated acute fill:  No short fill needed   Patient's daughter declined the following medications: Symbicort 80/4.5- 2 puffs into the lungs twice daily. Myrbetriq 50 mg:one tablet at bedtime Patient has plenty on hand   Confirmed delivery date of 07/19/2022 with patients daughter, advised patient that pharmacy will contact them the morning of  delivery.  Care Gaps: AWV - scheduled for 03/23/2023 Last BP - 124/70 on 04/20/2022 Last A1C - 5.1 on 01/28/2022 Covid booster - overdue Mammogram - overdue Tdap - overdue  Star Rating Drugs: Simvastatin '20mg'$  - last filled 06/14/2022 30 DS at [place] [phone]

## 2022-07-11 DIAGNOSIS — R41841 Cognitive communication deficit: Secondary | ICD-10-CM | POA: Diagnosis not present

## 2022-07-11 DIAGNOSIS — M6281 Muscle weakness (generalized): Secondary | ICD-10-CM | POA: Diagnosis not present

## 2022-07-11 DIAGNOSIS — R488 Other symbolic dysfunctions: Secondary | ICD-10-CM | POA: Diagnosis not present

## 2022-07-12 DIAGNOSIS — M6281 Muscle weakness (generalized): Secondary | ICD-10-CM | POA: Diagnosis not present

## 2022-07-13 DIAGNOSIS — M6281 Muscle weakness (generalized): Secondary | ICD-10-CM | POA: Diagnosis not present

## 2022-07-13 NOTE — Progress Notes (Addendum)
Anesthesia Review:  PCP: DR  Loralyn Freshwater with [facility] on [place]  Cardiologist : none  Chest x-ray : EKG : 07/18/22  Echo : Stress test: Cardiac Cath :  Activity level:  can do a fight of stairs without difficutly  Sleep Study/ CPAP : none  Fasting Blood Sugar :      / Checks Blood Sugar -- times a day:   Blood Thinner/ Instructions /Last Dose: ASA / Instructions/ Last Dose :   BMP done 07/18/22 routed ot DR Armandina Gemma.

## 2022-07-14 ENCOUNTER — Telehealth: Payer: Self-pay | Admitting: Family Medicine

## 2022-07-14 DIAGNOSIS — M6281 Muscle weakness (generalized): Secondary | ICD-10-CM | POA: Diagnosis not present

## 2022-07-14 NOTE — Telephone Encounter (Signed)
Requesting a call for medication management. Patient is having surgery 07/21/2022

## 2022-07-14 NOTE — Telephone Encounter (Signed)
I called Jenny Reichmann, the patient's daughter in law for more information.  Jenny Reichmann stated the patient is having parathyroid surgery on 8/3 and when she was contacted about the surgery, medications, etc they noticed that Levothyroxine was not on the list from [facility] as a medication that she takes.  Jenny Reichmann reviewed the list again, stated she didn't write the name of the medication down in error and now sees this.  She also questioned if the patient should be taking Myrbetriq '50mg'$  daily and I advised her this is on the medication list, lastly given in August 2022 by Dr Ethlyn Gallery; however she may want to contact Dr Bishop [place] office (urology) to make sure and she agreed.

## 2022-07-15 DIAGNOSIS — M6281 Muscle weakness (generalized): Secondary | ICD-10-CM | POA: Diagnosis not present

## 2022-07-15 NOTE — Patient Instructions (Signed)
SURGICAL WAITING ROOM VISITATION Patients having surgery or a procedure may have no more than 2 support people in the waiting area - these visitors may rotate.   Children under the age of 63 must have an adult with them who is not the patient. If the patient needs to stay at the hospital during part of their recovery, the visitor guidelines for inpatient rooms apply. Pre-op nurse will coordinate an appropriate time for 1 support person to accompany patient in pre-op.  This support person may not rotate.    Please refer to the [facility] website for the visitor guidelines for Inpatients (after your surgery is over and you are in a regular room).       Your procedure is scheduled on:  07/21/2022    Report to [facility] Main Entrance    Report to admitting at   [phone]   Call this number if you have problems the morning of surgery [phone]   Do not eat food :After Midnight.   After Midnight you may have the following liquids until ___ 0430___ AM  DAY OF SURGERY  Water Non-Citrus Juices (without pulp, NO RED) Carbonated Beverages Black Coffee (NO MILK/CREAM OR CREAMERS, sugar ok)  Clear Tea (NO MILK/CREAM OR CREAMERS, sugar ok) regular and decaf                             Plain Jell-O (NO RED)                                           Fruit ices (not with fruit pulp, NO RED)                                     Popsicles (NO RED)                                                               Sports drinks like Gatorade (NO RED)                    The day of surgery:  Drink ONE (1) Pre-Surgery Clear Ensure or G2 at AM the morning of surgery. Drink in one sitting. Do not sip.  This drink was given to you during your hospital  pre-op appointment visit. Nothing else to drink after completing the  Pre-Surgery Clear Ensure or G2.          If you have questions, please contact your surgeon's office.        Oral Hygiene is also important to reduce your risk of  infection.                                    Remember - BRUSH YOUR TEETH THE MORNING OF SURGERY WITH YOUR REGULAR TOOTHPASTE   Do NOT smoke after Midnight   Take these medicines the morning of surgery with A SIP OF WATER:  cymbalta, synthroid, metoprolol, myrbetriq, singulair , inhalers as usual and bring  DO NOT TAKE ANY ORAL DIABETIC MEDICATIONS DAY OF YOUR SURGERY  Bring CPAP mask and tubing day of surgery.                              You may not have any metal on your body including hair pins, jewelry, and body piercing             Do not wear make-up, lotions, powders, perfumes/cologne, or deodorant  Do not wear nail polish including gel and S&S, artificial/acrylic nails, or any other type of covering on natural nails including finger and toenails. If you have artificial nails, gel coating, etc. that needs to be removed by a nail salon please have this removed prior to surgery or surgery may need to be canceled/ delayed if the surgeon/ anesthesia feels like they are unable to be safely monitored.   Do not shave  48 hours prior to surgery.               Men may shave face and neck.   Do not bring valuables to the hospital. [place].   Contacts, dentures or bridgework may not be worn into surgery.   Bring small overnight bag day of surgery.   DO NOT [place]. PHARMACY WILL DISPENSE MEDICATIONS LISTED ON YOUR MEDICATION LIST TO YOU DURING YOUR ADMISSION [place]!    Patients discharged on the day of surgery will not be allowed to drive home.  Someone NEEDS to stay with you for the first 24 hours after anesthesia.   Special Instructions: Bring a copy of your healthcare power of attorney and living will documents         the day of surgery if you haven't scanned them before.              Please read over the following fact sheets you were given: IF YOU HAVE QUESTIONS ABOUT YOUR PRE-OP  INSTRUCTIONS PLEASE CALL [phone]     [facility] - Preparing for Surgery Before surgery, you can play an important role.  Because skin is not sterile, your skin needs to be as free of germs as possible.  You can reduce the number of germs on your skin by washing with CHG (chlorahexidine gluconate) soap before surgery.  CHG is an antiseptic cleaner which kills germs and bonds with the skin to continue killing germs even after washing. Please DO NOT use if you have an allergy to CHG or antibacterial soaps.  If your skin becomes reddened/irritated stop using the CHG and inform your nurse when you arrive at Short Stay. Do not shave (including legs and underarms) for at least 48 hours prior to the first CHG shower.  You may shave your face/neck. Please follow these instructions carefully:  1.  Shower with CHG Soap the night before surgery and the  morning of Surgery.  2.  If you choose to wash your hair, wash your hair first as usual with your  normal  shampoo.  3.  After you shampoo, rinse your hair and body thoroughly to remove the  shampoo.                           4.  Use CHG as you would any other liquid soap.  You can apply chg directly  to the skin and wash                       Gently with a scrungie or clean washcloth.  5.  Apply the CHG Soap to your body ONLY FROM THE NECK DOWN.   Do not use on face/ open                           Wound or open sores. Avoid contact with eyes, ears mouth and genitals (private parts).                       Wash face,  Genitals (private parts) with your normal soap.             6.  Wash thoroughly, paying special attention to the area where your surgery  will be performed.  7.  Thoroughly rinse your body with warm water from the neck down.  8.  DO NOT shower/wash with your normal soap after using and rinsing off  the CHG Soap.                9.  Pat yourself dry with a clean towel.            10.  Wear clean pajamas.            11.  Place clean sheets on  your bed the night of your first shower and do not  sleep with pets. Day of Surgery : Do not apply any lotions/deodorants the morning of surgery.  Please wear clean clothes to the hospital/surgery center.  FAILURE TO FOLLOW THESE INSTRUCTIONS MAY RESULT IN THE CANCELLATION OF YOUR SURGERY PATIENT SIGNATURE_________________________________  NURSE SIGNATURE__________________________________  ________________________________________________________________________

## 2022-07-17 ENCOUNTER — Encounter (HOSPITAL_COMMUNITY): Payer: Self-pay | Admitting: Surgery

## 2022-07-17 DIAGNOSIS — E210 Primary hyperparathyroidism: Secondary | ICD-10-CM | POA: Diagnosis present

## 2022-07-17 NOTE — H&P (Signed)
Sherri Chiem Charlotta Newton, MD    Chief Complaint: Follow-up (Primary hyperparathyroidism)  History of Present Illness:  Patient returns to my practice accompanied by her daughter to discuss further management of suspected primary hyperparathyroidism. Patient had been evaluated in 2020 and again in July 2022. Patient has biochemical evidence of primary hyperparathyroidism. She is symptomatic with chronic fatigue and weakness. She denies nephrolithiasis. She has not had a recent bone density scan. She has had no fractures. Patient has had a nuclear medicine parathyroid scan in the past which localizes a left inferior signal. However ultrasound did not show any sign of soft tissue mass to correspond with adenoma. Therefore we have requested a 4D CT scan of the neck to better define the anatomy and hopefully localize a parathyroid adenoma given the fact that the patient has had previous total thyroidectomy.   Most recent laboratory studies are dated January 28, 2022. Calcium level remains mildly elevated at 10.5. Intact PTH level was elevated at 177.  Review of Systems: A complete review of systems was obtained from the patient. I have reviewed this information and discussed as appropriate with the patient. See HPI as well for other ROS.  Review of Systems  Constitutional: Positive for malaise/fatigue.  HENT: Negative.  Eyes: Negative.  Respiratory: Negative.  Cardiovascular: Negative.  Gastrointestinal: Negative.  Genitourinary: Negative.  Musculoskeletal: Negative.  Skin: Negative.  Neurological: Negative.  Endo/Heme/Allergies: Negative.  Psychiatric/Behavioral: Negative.    Medical History: Past Medical History:  Diagnosis Date   Asthma, unspecified asthma severity, unspecified whether complicated, unspecified whether persistent   Primary hyperparathyroidism (CMS-HCC) 07/14/2021   Patient Active Problem List  Diagnosis   Primary hyperparathyroidism (CMS-HCC)   Chronic kidney  disease (CKD), stage IV (severe) (CMS-HCC)   Personal history of urinary calculi   Status post total thyroidectomy   Past Surgical History:  Procedure Laterality Date   REPLACEMENT TOTAL KNEE BILATERAL Bilateral    No Known Allergies  Current Outpatient Medications on File Prior to Visit  Medication Sig Dispense Refill   budesonide-formoteroL (SYMBICORT) 80-4.5 mcg/actuation inhaler Inhale 2 inhalations into the lungs 2 (two) times daily   DULoxetine (CYMBALTA) 30 MG DR capsule TAKE ONE CAPSULE BY MOUTH BEFORE BREAKFAST   FUROsemide (LASIX) 40 MG tablet TAKE ONE TABLET BY MOUTH ONCE WEEKLY BEFORE BREAKFAST ON MONDAY and TAKE ONE TABLET BY MOUTH ONCE WEEKLY BEFORE BREAKFAST ON WEDNESDAYS and TAKE ONE TABLET BY MOUTH ONCE WEEKLY BEFORE BREAKFAST ON FRIDAYS   levothyroxine (SYNTHROID) 137 MCG tablet Take by mouth   metoprolol tartrate (LOPRESSOR) 25 MG tablet TAKE ONE TABLET BY MOUTH BEFORE BREAKFAST and TAKE ONE TABLET BY MOUTH EVERYDAY AT BEDTIME   montelukast (SINGULAIR) 10 mg tablet TAKE ONE TABLET BY MOUTH BEFORE BREAKFAST   simvastatin (ZOCOR) 20 MG tablet TAKE ONE TABLET BY MOUTH EVERYDAY AT BEDTIME   amLODIPine (NORVASC) 10 MG tablet TAKE ONE TABLET BY MOUTH BEFORE BREAKFAST (Patient not taking: Reported on 02/15/2022)   MYRBETRIQ 25 mg ER Tablet TAKE ONE TABLET BY MOUTH EVERYDAY AT BEDTIME (Patient not taking: Reported on 02/15/2022)   No current facility-administered medications on file prior to visit.   Family History  Problem Relation Age of Onset   Myocardial Infarction (Heart attack) Father    Social History   Tobacco Use  Smoking Status Never  Smokeless Tobacco Never    Social History   Socioeconomic History   Marital status: Widowed  Tobacco Use   Smoking status: Never   Smokeless tobacco: Never  Substance  and Sexual Activity   Alcohol use: Not Currently   Drug use: Never   Objective:   Vitals:  Pulse: 102  Temp: 36.7 C (98 F)  SpO2: 98%  Weight: (!)  106.6 kg (235 lb)   Body mass index is 37.93 kg/m.  Physical Exam   GENERAL APPEARANCE Development: normal Nutritional status: normal Gross deformities: none  SKIN Rash, lesions, ulcers: none Induration, erythema: none Nodules: none palpable  EYES Conjunctiva and lids: normal Pupils: equal and reactive Iris: normal bilaterally  EARS, NOSE, MOUTH, THROAT External ears: no lesion or deformity External nose: no lesion or deformity Hearing: grossly normal Due to Covid-19 pandemic, patient is wearing a mask.  NECK Symmetric: yes Trachea: midline Thyroid: no palpable nodules in the thyroid bed; well-healed low anterior cervical incision  CHEST Respiratory effort: normal Retraction or accessory muscle use: no Breath sounds: normal bilaterally Rales, rhonchi, wheeze: none  CARDIOVASCULAR Auscultation: regular rhythm, normal rate Murmurs: none Pulses: radial pulse 2+ palpable Lower extremity edema: Mild bilateral edema at the ankles  MUSCULOSKELETAL Station and gait: normal Digits and nails: no clubbing or cyanosis Muscle strength: grossly normal all extremities Range of motion: grossly normal all extremities Deformity: none  LYMPHATIC Cervical: none palpable Supraclavicular: none palpable  PSYCHIATRIC Oriented to person, place, and time: yes Mood and affect: normal for situation Judgment and insight: appropriate for situation  Assessment and Plan:   Primary hyperparathyroidism (CMS-HCC)  Status post total thyroidectomy   Patient returns to my practice for further evaluation and recommendations regarding management of suspected primary hyperparathyroidism. Patient does have biochemical evidence of primary hyperparathyroidism. Nuclear medicine parathyroid scan has suggested a left inferior parathyroid adenoma. However, ultrasound did not show any soft tissue mass to correspond with the nuclear signal. I would like to obtain a 4D CT scan of the neck in hopes  of identifying the parathyroid adenoma and confirming its anatomic location. Patient has had a total thyroidectomy in the past which may make the anatomy somewhat more difficult at the time of surgery.  We discussed proceeding with parathyroidectomy at some point. I would like to monitor her overnight following the procedure as we do not know the status of all of her parathyroid glands. Patient understands and agrees to proceed with further imaging studies and to proceed with surgical resection if necessary.  Patient will undergo the 4D CT scan of the neck. We will contact her with those results when they are available.  ADDENDUM  Telephone call to patient's daughter with results of recent nuclear scan successfully localizing a left-sided parathyroid adenoma.  Plan to proceed with neck exploration and parathyroidectomy. Again we discussed the risk and benefits of the procedure including the risk of recurrent laryngeal nerve injury and risk of hypocalcemia as the status of the other parathyroid glands is unknown. Patient and her daughter understand and agree to proceed.  Will enter orders for surgery at [facility] [place] [facility] Office: [phone]

## 2022-07-18 ENCOUNTER — Other Ambulatory Visit: Payer: Self-pay

## 2022-07-18 ENCOUNTER — Ambulatory Visit (HOSPITAL_COMMUNITY)
Admission: RE | Admit: 2022-07-18 | Discharge: 2022-07-18 | Disposition: A | Discharge status: Home / Self Care | Payer: PPO | Source: Ambulatory Visit | Attending: Anesthesiology | Admitting: Anesthesiology

## 2022-07-18 ENCOUNTER — Encounter (HOSPITAL_COMMUNITY): Payer: Self-pay

## 2022-07-18 ENCOUNTER — Encounter (HOSPITAL_COMMUNITY)
Admission: RE | Admit: 2022-07-18 | Discharge: 2022-07-18 | Disposition: A | Discharge status: Home / Self Care | Payer: PPO | Source: Ambulatory Visit | Attending: Surgery | Admitting: Surgery

## 2022-07-18 VITALS — BP 132/58 | HR 52 | Temp 98.6°F | Resp 16 | Ht 66.0 in

## 2022-07-18 DIAGNOSIS — R488 Other symbolic dysfunctions: Secondary | ICD-10-CM | POA: Diagnosis not present

## 2022-07-18 DIAGNOSIS — Z01818 Encounter for other preprocedural examination: Secondary | ICD-10-CM | POA: Insufficient documentation

## 2022-07-18 DIAGNOSIS — R41841 Cognitive communication deficit: Secondary | ICD-10-CM | POA: Diagnosis not present

## 2022-07-18 LAB — BASIC METABOLIC PANEL
Anion gap: 10 (ref 5–15)
BUN: 37 mg/dL — ABNORMAL HIGH (ref 8–23)
CO2: 27 mmol/L (ref 22–32)
Calcium: 10.1 mg/dL (ref 8.9–10.3)
Chloride: 107 mmol/L (ref 98–111)
Creatinine, Ser: 1.86 mg/dL — ABNORMAL HIGH (ref 0.44–1.00)
GFR, Estimated: 26 mL/min — ABNORMAL LOW (ref >60)
Glucose, Bld: 111 mg/dL — ABNORMAL HIGH (ref 70–99)
Potassium: 3.4 mmol/L — ABNORMAL LOW (ref 3.5–5.1)
Sodium: 144 mmol/L (ref 135–145)

## 2022-07-18 LAB — CBC
HCT: 41.3 % (ref 36.0–46.0)
Hemoglobin: 13.5 g/dL (ref 12.0–15.0)
MCH: 30.3 pg (ref 26.0–34.0)
MCHC: 32.7 g/dL (ref 30.0–36.0)
MCV: 92.8 fL (ref 80.0–100.0)
Platelets: 180 10*3/uL (ref 150–400)
RBC: 4.45 MIL/uL (ref 3.87–5.11)
RDW: 13.2 % (ref 11.5–15.5)
WBC: 4.8 10*3/uL (ref 4.0–10.5)
nRBC: 0 % (ref 0.0–0.2)

## 2022-07-19 DIAGNOSIS — M6281 Muscle weakness (generalized): Secondary | ICD-10-CM | POA: Diagnosis not present

## 2022-07-20 NOTE — Anesthesia Preprocedure Evaluation (Addendum)
Anesthesia Evaluation  Patient identified by MRN, date of birth, ID band Patient awake    Reviewed: Allergy & Precautions, NPO status , Patient's Chart, lab work & pertinent test results, reviewed documented beta blocker date and time   Airway Mallampati: III  TM Distance: >3 FB Neck ROM: Full    Dental  (+) Teeth Intact, Dental Advisory Given, Caps   Pulmonary asthma , former smoker,    Pulmonary exam normal breath sounds clear to auscultation       Cardiovascular hypertension, Pt. on home beta blockers Normal cardiovascular exam Rhythm:Regular Rate:Normal     Neuro/Psych negative neurological ROS     GI/Hepatic Neg liver ROS, GERD  ,  Endo/Other  Hypothyroidism  PRIMARY HYPERPARATHYROIDISM  Renal/GU Renal InsufficiencyRenal disease     Musculoskeletal  (+) Arthritis ,   Abdominal   Peds  Hematology negative hematology ROS (+)   Anesthesia Other Findings   Reproductive/Obstetrics                            Anesthesia Physical Anesthesia Plan  ASA: 3  Anesthesia Plan: General   Post-op Pain Management: Tylenol PO (pre-op)*   Induction: Intravenous  PONV Risk Score and Plan: 4 or greater and Dexamethasone, Ondansetron and Treatment may vary due to age or medical condition  Airway Management Planned: Oral ETT  Additional Equipment:   Intra-op Plan:   Post-operative Plan: Extubation in OR  Informed Consent: I have reviewed the patients History and Physical, chart, labs and discussed the procedure including the risks, benefits and alternatives for the proposed anesthesia with the patient or authorized representative who has indicated his/her understanding and acceptance.     Dental advisory given  Plan Discussed with: CRNA  Anesthesia Plan Comments:        Anesthesia Quick Evaluation

## 2022-07-21 ENCOUNTER — Encounter (HOSPITAL_COMMUNITY): Payer: Self-pay | Admitting: Surgery

## 2022-07-21 ENCOUNTER — Other Ambulatory Visit: Payer: Self-pay

## 2022-07-21 ENCOUNTER — Ambulatory Visit (HOSPITAL_COMMUNITY): Payer: PPO | Admitting: Physician Assistant

## 2022-07-21 ENCOUNTER — Encounter (HOSPITAL_COMMUNITY)
Admission: RE | Disposition: A | Discharge status: Home / Self Care | Payer: Self-pay | Source: Home / Self Care | Attending: Surgery

## 2022-07-21 ENCOUNTER — Ambulatory Visit (HOSPITAL_COMMUNITY)
Admission: RE | Admit: 2022-07-21 | Discharge: 2022-07-22 | Disposition: A | Discharge status: Home / Self Care | Payer: PPO | Attending: Surgery | Admitting: Surgery

## 2022-07-21 ENCOUNTER — Ambulatory Visit (HOSPITAL_BASED_OUTPATIENT_CLINIC_OR_DEPARTMENT_OTHER): Payer: PPO | Admitting: Anesthesiology

## 2022-07-21 DIAGNOSIS — E890 Postprocedural hypothyroidism: Secondary | ICD-10-CM | POA: Diagnosis not present

## 2022-07-21 DIAGNOSIS — N184 Chronic kidney disease, stage 4 (severe): Secondary | ICD-10-CM | POA: Diagnosis not present

## 2022-07-21 DIAGNOSIS — E210 Primary hyperparathyroidism: Secondary | ICD-10-CM | POA: Insufficient documentation

## 2022-07-21 DIAGNOSIS — Z87891 Personal history of nicotine dependence: Secondary | ICD-10-CM | POA: Insufficient documentation

## 2022-07-21 DIAGNOSIS — E063 Autoimmune thyroiditis: Secondary | ICD-10-CM | POA: Diagnosis not present

## 2022-07-21 DIAGNOSIS — I129 Hypertensive chronic kidney disease with stage 1 through stage 4 chronic kidney disease, or unspecified chronic kidney disease: Secondary | ICD-10-CM | POA: Insufficient documentation

## 2022-07-21 DIAGNOSIS — D351 Benign neoplasm of parathyroid gland: Secondary | ICD-10-CM | POA: Diagnosis not present

## 2022-07-21 DIAGNOSIS — I10 Essential (primary) hypertension: Secondary | ICD-10-CM | POA: Diagnosis not present

## 2022-07-21 DIAGNOSIS — N289 Disorder of kidney and ureter, unspecified: Secondary | ICD-10-CM | POA: Diagnosis not present

## 2022-07-21 DIAGNOSIS — J45909 Unspecified asthma, uncomplicated: Secondary | ICD-10-CM | POA: Insufficient documentation

## 2022-07-21 DIAGNOSIS — K219 Gastro-esophageal reflux disease without esophagitis: Secondary | ICD-10-CM | POA: Diagnosis not present

## 2022-07-21 DIAGNOSIS — Z01818 Encounter for other preprocedural examination: Secondary | ICD-10-CM

## 2022-07-21 HISTORY — PX: PARATHYROIDECTOMY: SHX19

## 2022-07-21 LAB — BASIC METABOLIC PANEL
Anion gap: 9 (ref 5–15)
BUN: 36 mg/dL — ABNORMAL HIGH (ref 8–23)
CO2: 28 mmol/L (ref 22–32)
Calcium: 9.6 mg/dL (ref 8.9–10.3)
Chloride: 101 mmol/L (ref 98–111)
Creatinine, Ser: 1.95 mg/dL — ABNORMAL HIGH (ref 0.44–1.00)
GFR, Estimated: 25 mL/min — ABNORMAL LOW (ref >60)
Glucose, Bld: 161 mg/dL — ABNORMAL HIGH (ref 70–99)
Potassium: 3.4 mmol/L — ABNORMAL LOW (ref 3.5–5.1)
Sodium: 138 mmol/L (ref 135–145)

## 2022-07-21 SURGERY — PARATHYROIDECTOMY
Anesthesia: General | Laterality: Left

## 2022-07-21 MED ORDER — DULOXETINE HCL 30 MG PO CPEP
30.0000 mg | ORAL_CAPSULE | Freq: Every day | ORAL | Status: DC
  Administered 2022-07-21 – 2022-07-22 (×2): 30 mg via ORAL
  Filled 2022-07-21 (×2): qty 1

## 2022-07-21 MED ORDER — OXYCODONE HCL 5 MG PO TABS
5.0000 mg | ORAL_TABLET | ORAL | Status: DC | PRN
  Administered 2022-07-21: 10 mg via ORAL
  Filled 2022-07-21: qty 2

## 2022-07-21 MED ORDER — LEVOTHYROXINE SODIUM 125 MCG PO TABS: 225.0000 ug | ORAL_TABLET | ORAL | Status: DC

## 2022-07-21 MED ORDER — SUGAMMADEX SODIUM 200 MG/2ML IV SOLN
INTRAVENOUS | Status: DC | PRN
  Administered 2022-07-21: 200 mg via INTRAVENOUS

## 2022-07-21 MED ORDER — ORAL CARE MOUTH RINSE: 15.0000 mL | Freq: Once | OROMUCOSAL | Status: AC

## 2022-07-21 MED ORDER — LACTATED RINGERS IV SOLN: INTRAVENOUS | Status: DC

## 2022-07-21 MED ORDER — BUPIVACAINE HCL (PF) 0.25 % IJ SOLN
INTRAMUSCULAR | Status: AC
  Filled 2022-07-21: qty 30

## 2022-07-21 MED ORDER — MIRABEGRON ER 25 MG PO TB24
25.0000 mg | ORAL_TABLET | Freq: Every day | ORAL | Status: DC
  Administered 2022-07-21 – 2022-07-22 (×2): 25 mg via ORAL
  Filled 2022-07-21 (×3): qty 1

## 2022-07-21 MED ORDER — LEVOTHYROXINE SODIUM 75 MCG PO TABS: 150.0000 ug | ORAL_TABLET | ORAL | Status: DC

## 2022-07-21 MED ORDER — GLYCOPYRROLATE 0.2 MG/ML IJ SOLN
INTRAMUSCULAR | Status: DC | PRN
  Administered 2022-07-21: .2 mg via INTRAVENOUS

## 2022-07-21 MED ORDER — ROCURONIUM BROMIDE 10 MG/ML (PF) SYRINGE
PREFILLED_SYRINGE | INTRAVENOUS | Status: AC
  Filled 2022-07-21: qty 10

## 2022-07-21 MED ORDER — ONDANSETRON HCL 4 MG/2ML IJ SOLN: 4.0000 mg | Freq: Four times a day (QID) | INTRAMUSCULAR | Status: DC | PRN

## 2022-07-21 MED ORDER — MONTELUKAST SODIUM 10 MG PO TABS
10.0000 mg | ORAL_TABLET | Freq: Every morning | ORAL | Status: DC
  Administered 2022-07-21 – 2022-07-22 (×2): 10 mg via ORAL
  Filled 2022-07-21 (×2): qty 1

## 2022-07-21 MED ORDER — EPHEDRINE SULFATE-NACL 50-0.9 MG/10ML-% IV SOSY
PREFILLED_SYRINGE | INTRAVENOUS | Status: DC | PRN
  Administered 2022-07-21 (×2): 5 mg via INTRAVENOUS

## 2022-07-21 MED ORDER — CHLORHEXIDINE GLUCONATE CLOTH 2 % EX PADS: 6.0000 | MEDICATED_PAD | Freq: Once | CUTANEOUS | Status: DC

## 2022-07-21 MED ORDER — SODIUM CHLORIDE 0.45 % IV SOLN
INTRAVENOUS | Status: DC
  Administered 2022-07-21: 1000 mL via INTRAVENOUS

## 2022-07-21 MED ORDER — 0.9 % SODIUM CHLORIDE (POUR BTL) OPTIME
TOPICAL | Status: DC | PRN
  Administered 2022-07-21: 1000 mL

## 2022-07-21 MED ORDER — PROPOFOL 10 MG/ML IV BOLUS
INTRAVENOUS | Status: DC | PRN
  Administered 2022-07-21: 130 mg via INTRAVENOUS

## 2022-07-21 MED ORDER — ACETAMINOPHEN 500 MG PO TABS
1000.0000 mg | ORAL_TABLET | Freq: Once | ORAL | Status: AC
  Administered 2022-07-21: 1000 mg via ORAL
  Filled 2022-07-21: qty 2

## 2022-07-21 MED ORDER — PROPOFOL 10 MG/ML IV BOLUS
INTRAVENOUS | Status: AC
  Filled 2022-07-21: qty 20

## 2022-07-21 MED ORDER — FUROSEMIDE 40 MG PO TABS
60.0000 mg | ORAL_TABLET | Freq: Two times a day (BID) | ORAL | Status: DC
  Administered 2022-07-21 – 2022-07-22 (×2): 60 mg via ORAL
  Filled 2022-07-21 (×2): qty 1

## 2022-07-21 MED ORDER — EPHEDRINE 5 MG/ML INJ
INTRAVENOUS | Status: AC
  Filled 2022-07-21: qty 5

## 2022-07-21 MED ORDER — ACETAMINOPHEN 325 MG PO TABS
650.0000 mg | ORAL_TABLET | Freq: Four times a day (QID) | ORAL | Status: DC | PRN
  Administered 2022-07-21: 650 mg via ORAL
  Filled 2022-07-21 (×2): qty 2

## 2022-07-21 MED ORDER — ROCURONIUM BROMIDE 10 MG/ML (PF) SYRINGE
PREFILLED_SYRINGE | INTRAVENOUS | Status: DC | PRN
  Administered 2022-07-21: 50 mg via INTRAVENOUS

## 2022-07-21 MED ORDER — PHENOL 1.4 % MT LIQD
1.0000 | OROMUCOSAL | Status: DC | PRN
  Filled 2022-07-21: qty 177

## 2022-07-21 MED ORDER — HYDROMORPHONE HCL 1 MG/ML IJ SOLN
1.0000 mg | INTRAMUSCULAR | Status: DC | PRN
  Filled 2022-07-21: qty 1

## 2022-07-21 MED ORDER — LEVOTHYROXINE SODIUM 75 MCG PO TABS
150.0000 ug | ORAL_TABLET | ORAL | Status: DC
  Administered 2022-07-21 – 2022-07-22 (×2): 150 ug via ORAL
  Filled 2022-07-21 (×2): qty 2

## 2022-07-21 MED ORDER — ONDANSETRON 4 MG PO TBDP: 4.0000 mg | ORAL_TABLET | Freq: Four times a day (QID) | ORAL | Status: DC | PRN

## 2022-07-21 MED ORDER — TRAMADOL HCL 50 MG PO TABS
50.0000 mg | ORAL_TABLET | Freq: Two times a day (BID) | ORAL | Status: DC | PRN
  Administered 2022-07-21: 50 mg via ORAL
  Filled 2022-07-21: qty 1

## 2022-07-21 MED ORDER — METOPROLOL TARTRATE 25 MG PO TABS
25.0000 mg | ORAL_TABLET | Freq: Two times a day (BID) | ORAL | Status: DC
  Administered 2022-07-21 – 2022-07-22 (×3): 25 mg via ORAL
  Filled 2022-07-21 (×3): qty 1

## 2022-07-21 MED ORDER — DEXAMETHASONE SODIUM PHOSPHATE 10 MG/ML IJ SOLN
INTRAMUSCULAR | Status: AC
  Filled 2022-07-21: qty 1

## 2022-07-21 MED ORDER — DEXAMETHASONE SODIUM PHOSPHATE 10 MG/ML IJ SOLN
INTRAMUSCULAR | Status: DC | PRN
  Administered 2022-07-21: 10 mg via INTRAVENOUS

## 2022-07-21 MED ORDER — ONDANSETRON HCL 4 MG/2ML IJ SOLN
INTRAMUSCULAR | Status: AC
Start: 2022-07-21 — End: ?
  Filled 2022-07-21: qty 2

## 2022-07-21 MED ORDER — GLYCOPYRROLATE 0.2 MG/ML IJ SOLN
INTRAMUSCULAR | Status: AC
Start: 2022-07-21 — End: ?
  Filled 2022-07-21: qty 1

## 2022-07-21 MED ORDER — FENTANYL CITRATE (PF) 100 MCG/2ML IJ SOLN
INTRAMUSCULAR | Status: AC
  Filled 2022-07-21: qty 2

## 2022-07-21 MED ORDER — ONDANSETRON HCL 4 MG/2ML IJ SOLN
INTRAMUSCULAR | Status: DC | PRN
  Administered 2022-07-21: 4 mg via INTRAVENOUS

## 2022-07-21 MED ORDER — ACETAMINOPHEN 650 MG RE SUPP: 650.0000 mg | Freq: Four times a day (QID) | RECTAL | Status: DC | PRN

## 2022-07-21 MED ORDER — CEFAZOLIN SODIUM-DEXTROSE 2-4 GM/100ML-% IV SOLN
2.0000 g | INTRAVENOUS | Status: AC
  Administered 2022-07-21: 2 g via INTRAVENOUS
  Filled 2022-07-21: qty 100

## 2022-07-21 MED ORDER — MIRABEGRON ER 25 MG PO TB24
50.0000 mg | ORAL_TABLET | Freq: Every day | ORAL | Status: DC
Start: 2022-07-21 — End: 2022-07-21

## 2022-07-21 MED ORDER — FENTANYL CITRATE (PF) 100 MCG/2ML IJ SOLN
INTRAMUSCULAR | Status: DC | PRN
  Administered 2022-07-21: 50 ug via INTRAVENOUS

## 2022-07-21 MED ORDER — FENTANYL CITRATE PF 50 MCG/ML IJ SOSY
PREFILLED_SYRINGE | INTRAMUSCULAR | Status: AC
  Filled 2022-07-21: qty 3

## 2022-07-21 MED ORDER — ONDANSETRON HCL 4 MG/2ML IJ SOLN: 4.0000 mg | Freq: Once | INTRAMUSCULAR | Status: DC | PRN

## 2022-07-21 MED ORDER — HEMOSTATIC AGENTS (NO CHARGE) OPTIME
TOPICAL | Status: DC | PRN
  Administered 2022-07-21: 1 via TOPICAL

## 2022-07-21 MED ORDER — LIDOCAINE HCL (CARDIAC) PF 100 MG/5ML IV SOSY
PREFILLED_SYRINGE | INTRAVENOUS | Status: DC | PRN
  Administered 2022-07-21: 60 mg via INTRAVENOUS

## 2022-07-21 MED ORDER — CHLORHEXIDINE GLUCONATE 0.12 % MT SOLN
15.0000 mL | Freq: Once | OROMUCOSAL | Status: AC
  Administered 2022-07-21: 15 mL via OROMUCOSAL

## 2022-07-21 MED ORDER — LIDOCAINE HCL (PF) 2 % IJ SOLN
INTRAMUSCULAR | Status: AC
  Filled 2022-07-21: qty 5

## 2022-07-21 MED ORDER — FENTANYL CITRATE PF 50 MCG/ML IJ SOSY
25.0000 ug | PREFILLED_SYRINGE | INTRAMUSCULAR | Status: DC | PRN
  Administered 2022-07-21 (×3): 50 ug via INTRAVENOUS

## 2022-07-21 SURGICAL SUPPLY — 41 items
ADH SKN CLS APL DERMABOND .7 (GAUZE/BANDAGES/DRESSINGS) ×1
APL PRP STRL LF DISP 70% ISPRP (MISCELLANEOUS) ×1
ATTRACTOMAT 16X20 MAGNETIC DRP (DRAPES) ×2 IMPLANT
BAG COUNTER SPONGE SURGICOUNT (BAG) ×1 IMPLANT
BAG SPNG CNTER NS LX DISP (BAG)
BLADE SURG 15 STRL LF DISP TIS (BLADE) ×1 IMPLANT
BLADE SURG 15 STRL SS (BLADE) ×2
CHLORAPREP W/TINT 26 (MISCELLANEOUS) ×2 IMPLANT
CLIP TI MEDIUM 6 (CLIP) ×4 IMPLANT
CLIP TI WIDE RED SMALL 6 (CLIP) ×6 IMPLANT
COVER SURGICAL LIGHT HANDLE (MISCELLANEOUS) ×2 IMPLANT
DERMABOND ADVANCED (GAUZE/BANDAGES/DRESSINGS) ×1
DERMABOND ADVANCED .7 DNX12 (GAUZE/BANDAGES/DRESSINGS) ×1 IMPLANT
DRAPE LAPAROTOMY T 98X78 PEDS (DRAPES) ×2 IMPLANT
DRAPE UTILITY XL STRL (DRAPES) ×2 IMPLANT
ELECT REM PT RETURN 15FT ADLT (MISCELLANEOUS) ×2 IMPLANT
GAUZE 4X4 16PLY ~~LOC~~+RFID DBL (SPONGE) ×2 IMPLANT
GLOVE SURG ORTHO 8.0 STRL STRW (GLOVE) ×2 IMPLANT
GLOVE SURG SYN 7.5  E (GLOVE) ×6
GLOVE SURG SYN 7.5 E (GLOVE) ×3 IMPLANT
GLOVE SURG SYN 7.5 PF PI (GLOVE) ×3 IMPLANT
GOWN STRL REUS W/ TWL XL LVL3 (GOWN DISPOSABLE) ×3 IMPLANT
GOWN STRL REUS W/TWL XL LVL3 (GOWN DISPOSABLE) ×6
HEMOSTAT SURGICEL 2X4 FIBR (HEMOSTASIS) ×2 IMPLANT
ILLUMINATOR WAVEGUIDE N/F (MISCELLANEOUS) ×1 IMPLANT
KIT BASIN OR (CUSTOM PROCEDURE TRAY) ×2 IMPLANT
KIT TURNOVER KIT A (KITS) ×1 IMPLANT
NDL HYPO 25X1 1.5 SAFETY (NEEDLE) ×1 IMPLANT
NEEDLE HYPO 25X1 1.5 SAFETY (NEEDLE) ×2 IMPLANT
PACK BASIC VI WITH GOWN DISP (CUSTOM PROCEDURE TRAY) ×2 IMPLANT
PENCIL HANDSWITCHING (ELECTRODE) ×1 IMPLANT
PENCIL SMOKE EVACUATOR (MISCELLANEOUS) ×2 IMPLANT
SHEARS HARMONIC 9CM CVD (BLADE) ×1 IMPLANT
SUT MNCRL AB 4-0 PS2 18 (SUTURE) ×2 IMPLANT
SUT SILK 3 0 12 30 (SUTURE) ×1 IMPLANT
SUT VIC AB 3-0 SH 18 (SUTURE) ×3 IMPLANT
SYR BULB IRRIG 60ML STRL (SYRINGE) ×2 IMPLANT
SYR CONTROL 10ML LL (SYRINGE) ×2 IMPLANT
TOWEL OR 17X26 10 PK STRL BLUE (TOWEL DISPOSABLE) ×2 IMPLANT
TOWEL OR NON WOVEN STRL DISP B (DISPOSABLE) ×1 IMPLANT
TUBING CONNECTING 10 (TUBING) ×2 IMPLANT

## 2022-07-21 NOTE — Op Note (Signed)
Operative Note  Pre-operative Diagnosis:  primary hyperparathyroidism, status post total thyroidectomy  Post-operative Diagnosis:  same  Surgeon:  Armandina Gemma, MD  Assistant:  none   Procedure:  1. Left superior parathyroidectomy  2. Left thyroid lobectomy  Anesthesia:  general  Estimated Blood Loss:  20 cc  Drains: none         Specimen: to pathology; frozen section positive hypercellular parathyroid tissue  Indications:  Patient returns to my practice accompanied by her daughter to discuss further management of suspected primary hyperparathyroidism. Patient had been evaluated in 2020 and again in July 2022. Patient has biochemical evidence of primary hyperparathyroidism. She is symptomatic with chronic fatigue and weakness. She denies nephrolithiasis. She has not had a recent bone density scan. She has had no fractures. Patient has had a nuclear medicine parathyroid scan in the past which localizes a left inferior signal.  Further imaging indicates the presence of a fairly large soft tissue mass in the left thyroid bed.  It is difficult to determine whether this represents a large parathyroid adenoma or residual or recurrent thyroid tissue.  Patient now comes to surgery for neck exploration and parathyroidectomy.  Procedure:  The patient was seen in the pre-op holding area. The risks, benefits, complications, treatment options, and expected outcomes were previously discussed with the patient. The patient agreed with the proposed plan and has signed the informed consent form.  The patient was brought to the operating room by the surgical team, identified as [name] and the procedure verified. A "time out" was completed and the above information confirmed.  Following administration of general anesthesia, the patient is positioned and then prepped and draped in usual aseptic fashion.  After ascertaining that an adequate level of anesthesia been achieved, a small Kocher incision is made with  a #15 blade.  Dissection is carried through subcutaneous tissues and platysma.  Hemostasis is achieved with the electrocautery.  External jugular veins are suture ligated with 3-0 Vicryl suture ligatures and tied with 3-0 silk ties.  Subplatysmal flaps are elevated cephalad and caudad.  A Mahorner retractor is placed for exposure.  Strap muscles are incised in the midline.  Dissection is begun on the left side.  Strap muscles are reflected laterally exposing a mass in the left thyroid bed.  This has the appearance of thyroid tissue.  Venous tributaries are divided between small and medium ligaclips.  Further mobilization reveals a nodular mass at the superior portion of the tissue which may represent parathyroid tissue.  The entire mass is gently mobilized.  Vascular structures are dissected out and divided between ligaclips with the harmonic scalpel.  Gland is rolled anteriorly.  No normal parathyroid tissue was identified.  Posterior structures are dissected off of the capsule of the mass and left in situ.  The mass is mobilized anteriorly and up and onto the anterior trachea.  It is excised off the trachea using the electrocautery for hemostasis.  The mass is inspected closely.  There is approximately a 1.5 cm rounded mass at the superior portion of the tissue.  This is marked with a suture and the entire specimen is submitted to pathology.  Frozen section confirms hypercellular parathyroid tissue consistent with parathyroid adenoma.  On gross examination, I suspect the remaining tissue represents benign thyroid parenchyma.  Neck is irrigated with warm saline.  Good hemostasis is noted throughout the operative field.  Fibrillar is placed throughout the operative field.  Strap muscles are reapproximated in the midline of interrupted 3-0  Vicryl sutures.  Platysma is closed with interrupted 3-0 Vicryl sutures.  Skin is closed with a running 4-0 Monocryl subcuticular suture.  Wound is washed and dried and  Dermabond is applied as dressing.  Patient is awakened from anesthesia and transported to the recovery room in stable condition.  The patient tolerated the procedure well.   Armandina Gemma, [facility] Office: [phone]

## 2022-07-21 NOTE — Anesthesia Postprocedure Evaluation (Signed)
Anesthesia Post Note  Patient: [name]  Procedure(s) Performed: LEFT NECK EXPLORATION AND PARATHYROIDECTOMY (Left)     Patient location during evaluation: PACU Anesthesia Type: General Level of consciousness: awake and alert Pain management: pain level controlled Vital Signs Assessment: post-procedure vital signs reviewed and stable Respiratory status: spontaneous breathing, nonlabored ventilation, respiratory function stable and patient connected to nasal cannula oxygen Cardiovascular status: blood pressure returned to baseline and stable Postop Assessment: no apparent nausea or vomiting Anesthetic complications: no   No notable events documented.  Last Vitals:  Vitals:   07/21/22 1242 07/21/22 1328  BP: (!) 181/88 (!) 181/77  Pulse: 60 61  Resp:    Temp:    SpO2: 99% 100%    Last Pain:  Vitals:   07/21/22 1325  TempSrc:   PainSc: 3                  Santa Lighter

## 2022-07-21 NOTE — Interval H&P Note (Signed)
History and Physical Interval Note:  07/21/2022 7:00 AM  [name]  has presented today for surgery, with the diagnosis of PRIMARY HYPERPARATHYROIDISM.  The various methods of treatment have been discussed with the patient and family. After consideration of risks, benefits and other options for treatment, the patient has consented to    Procedure(s): LEFT NECK EXPLORATION AND PARATHYROIDECTOMY (Left) as a surgical intervention.    The patient's history has been reviewed, patient examined, no change in status, stable for surgery.  I have reviewed the patient's chart and labs.  Questions were answered to the patient's satisfaction.    Armandina Gemma, [facility] [place] practice Office: [place]

## 2022-07-21 NOTE — Transfer of Care (Signed)
Immediate Anesthesia Transfer of Care Note  Patient: [name]  Procedure(s) Performed: LEFT NECK EXPLORATION AND PARATHYROIDECTOMY (Left)  Patient Location: PACU  Anesthesia Type:General  Level of Consciousness: sedated  Airway & Oxygen Therapy: Patient Spontanous Breathing and Patient connected to face mask oxygen  Post-op Assessment: Report given to RN and Post -op Vital signs reviewed and stable  Post vital signs: Reviewed and stable  Last Vitals:  Vitals Value Taken Time  BP 154/131 07/21/22 0904  Temp    Pulse 62 07/21/22 0906  Resp 20 07/21/22 0906  SpO2 100 % 07/21/22 0906  Vitals shown include unvalidated device data.  Last Pain:  Vitals:   07/21/22 0610  TempSrc:   PainSc: 0-No pain         Complications: No notable events documented.

## 2022-07-21 NOTE — Anesthesia Procedure Notes (Signed)
Procedure Name: Intubation Date/Time: 07/21/2022 7:26 AM  Performed by: Lind Covert, CRNAPre-anesthesia Checklist: Patient identified, Emergency Drugs available, Suction available, Patient being monitored and Timeout performed Patient Re-evaluated:Patient Re-evaluated prior to induction Oxygen Delivery Method: Circle system utilized Preoxygenation: Pre-oxygenation with 100% oxygen Induction Type: IV induction Ventilation: Mask ventilation without difficulty Laryngoscope Size: Mac and 4 Grade View: Grade I Tube type: Oral Tube size: 7.0 mm Number of attempts: 1 Airway Equipment and Method: Stylet Placement Confirmation: ETT inserted through vocal cords under direct vision, positive ETCO2 and breath sounds checked- equal and bilateral Secured at: 21 cm Tube secured with: Tape Dental Injury: Teeth and Oropharynx as per pre-operative assessment

## 2022-07-22 ENCOUNTER — Encounter (HOSPITAL_COMMUNITY): Payer: Self-pay | Admitting: Surgery

## 2022-07-22 DIAGNOSIS — E210 Primary hyperparathyroidism: Secondary | ICD-10-CM | POA: Diagnosis not present

## 2022-07-22 LAB — BASIC METABOLIC PANEL
Anion gap: 10 (ref 5–15)
BUN: 34 mg/dL — ABNORMAL HIGH (ref 8–23)
CO2: 28 mmol/L (ref 22–32)
Calcium: 9.3 mg/dL (ref 8.9–10.3)
Chloride: 102 mmol/L (ref 98–111)
Creatinine, Ser: 2.06 mg/dL — ABNORMAL HIGH (ref 0.44–1.00)
GFR, Estimated: 23 mL/min — ABNORMAL LOW (ref >60)
Glucose, Bld: 131 mg/dL — ABNORMAL HIGH (ref 70–99)
Potassium: 3.8 mmol/L (ref 3.5–5.1)
Sodium: 140 mmol/L (ref 135–145)

## 2022-07-22 LAB — SURGICAL PATHOLOGY

## 2022-07-22 MED ORDER — TRAMADOL HCL 50 MG PO TABS: 50.0000 mg | ORAL_TABLET | Freq: Four times a day (QID) | ORAL | 0 refills | Status: DC | PRN

## 2022-07-22 NOTE — Discharge Instructions (Signed)
[facility] - Dr. Rakeem Colley  THYROID & PARATHYROID SURGERY:  POST-OP INSTRUCTIONS  Always review the instruction sheet provided by the hospital nurse at discharge.  A prescription for pain medication may be sent to your pharmacy at the time of discharge.  Take your pain medication as prescribed.  If narcotic pain medicine is not needed, then you may take acetaminophen (Tylenol) or ibuprofen (Advil) as needed for pain or soreness.  Take your normal home medications as prescribed unless otherwise directed.  If you need a refill on your pain medication, please contact the office during regular business hours.  Prescriptions will not be processed by the office after 5:00PM or on weekends.  Start with a light diet upon arrival home, such as soup and crackers or toast.  Be sure to drink plenty of fluids.  Resume your normal diet the day after surgery.  Most patients will experience some swelling and bruising on the chest and neck area.  Ice packs will help for the first 48 hours after arriving home.  Swelling and bruising will take several days to resolve.   It is common to experience some constipation after surgery.  Increasing fluid intake and taking a stool softener (Colace) will usually help to prevent this problem.  A mild laxative (Milk of Magnesia or Miralax) should be taken according to package directions if there has been no bowel movement after 48 hours.  Dermabond glue covers your incision. This seals the wound and you may shower at any time. The Dermabond will remain in place for about a week.  You may gradually remove the glue when it loosens around the edges.  If you need to loosen the Dermabond for removal, apply a layer of Vaseline to the wound for 15 minutes and then remove with a Kleenex. Your sutures are under the skin and will not show - they will dissolve on their own.  You may resume light daily activities beginning the day after discharge (such as self-care,  walking, climbing stairs), gradually increasing activities as tolerated. You may have sexual intercourse when it is comfortable. Refrain from any heavy lifting or straining until approved by your doctor. You may drive when you no longer are taking prescription pain medication, you can comfortably wear a seatbelt, and you can safely maneuver your car and apply the brakes.  You will see your doctor in the office for a follow-up appointment approximately three weeks after your surgery.  Make sure that you call for this appointment within a day or two after you arrive home to insure a convenient appointment time. Please have any requested laboratory tests performed a few days prior to your office visit so that the results will be available at your follow up appointment.  WHEN TO CALL THE [facility] OFFICE: -- Fever greater than 101.5 -- Inability to urinate -- Nausea and/or vomiting - persistent -- Extreme swelling or bruising -- Continued bleeding from incision -- Increased pain, redness, or drainage from the incision -- Difficulty swallowing or breathing -- Muscle cramping or spasms -- Numbness or tingling in hands or around lips  The clinic staff is available to answer your questions during regular business hours.  Please don't hesitate to call and ask to speak to one of the nurses if you have concerns.  [facility] OFFICE: [phone] (24 hours)  Please sign up for MyChart accounts. This will allow you to communicate directly with my nurse or myself without having to call the office. It will also allow you   to view your test results. You will need to enroll in MyChart for my office ([facility]) and for the hospital ().  Aleeza [place], MD [facility] practice 

## 2022-07-22 NOTE — Discharge Summary (Signed)
Physician Discharge Summary   Patient ID: [name] MRN: [id number] DOB/AGE: [birth year] 87 y.o.  Admit date: 07/21/2022  Discharge date: 07/22/2022  Discharge Diagnoses:  Principal Problem:   Hyperparathyroidism, primary [facility]) Active Problems:   Primary hyperparathyroidism [facility])   Discharged Condition: good  Hospital Course: Patient was admitted for observation following thyroid and parathyroid surgery.  Post op course was uncomplicated.  Pain was well controlled.  Tolerated diet.  Post op calcium level on morning following surgery was 9.3 mg/dl.  Patient was prepared for discharge home on POD#1.  Consults: None  Treatments: surgery: left parathyroidectomy, left thyroid lobectomy  Discharge Exam: Blood pressure (!) 153/86, pulse 66, temperature 98 F (36.7 C), temperature source Oral, resp. rate 18, height '5\' 6"'$  (1.676 m), weight 109.4 kg, SpO2 91 %. HEENT - clear Neck - wound dry and intact; mild STS and ecchymosis; voice slightly hoarse; Dermabond in place  Disposition: Home  Discharge Instructions     Diet - low sodium heart healthy   Complete by: As directed    Ice pack   Complete by: As directed    Increase activity slowly   Complete by: As directed    No dressing needed   Complete by: As directed       Allergies as of 07/22/2022   No Known Allergies      Medication List     TAKE these medications    DULoxetine 30 MG capsule Commonly known as: CYMBALTA TAKE ONE CAPSULE BY MOUTH BEFORE BREAKFAST   furosemide 40 MG tablet Commonly known as: LASIX Take 1.5 tablets (60 mg total) by mouth 2 (two) times daily.   levothyroxine 150 MCG tablet Commonly known as: SYNTHROID TAKE 1 AND 1/2 TABLETS BY MOUTH ONCE WEEKLY BEFORE BREAKFAST ON MONDAY and TAKE ONE TABLET BY MOUTH ONCE WEEKLY BEFORE BREAKFAST ON  TUESDAYS and TAKE ONE TABLET BY MOUTH ONCE WEEKLY BEFORE BREAKFAST ON WEDNESDAYS and TAKE ONE TABLET BY MOUTH ONCE WEEKLY BEFORE BREAKFAST ON THURSDAYS and  TAKE ONE TABLET BY MOUTH ONCE WEEKLY BEFORE BREAKFAST ON FRIDAYS and TAKE ONE TABLET BY MOUTH ONCE WEEKLY BEFORE BREAKFAST ON SATURDAY and TAKE ONE TABLET BY MOUTH ONCE WEEKLY BEFORE BREAKFAST ON 'SUNDAYS   metoprolol tartrate 25 MG tablet Commonly known as: LOPRESSOR TAKE ONE TABLET BY MOUTH BEFORE BREAKFAST and TAKE ONE TABLET BY MOUTH EVERYDAY AT BEDTIME   mirabegron ER 50 MG Tb24 tablet Commonly known as: Myrbetriq Take 1 tablet (50 mg total) by mouth daily.   montelukast 10 MG tablet Commonly known as: SINGULAIR TAKE ONE TABLET BY MOUTH BEFORE BREAKFAST   simvastatin 20 MG tablet Commonly known as: ZOCOR TAKE ONE TABLET BY MOUTH EVERYDAY AT BEDTIME   Symbicort 80-4.5 MCG/ACT inhaler Generic drug: budesonide-formoterol INHALE TWO PUFFS BY MOUTH INTO LUNGS twice daily What changed: See the new instructions.   traMADol 50 MG tablet Commonly known as: ULTRAM Take 1-2 tablets (50-100 mg total) by mouth every 6 (six) hours as needed for moderate pain.   Vitamin D3 50 MCG (2000 UT) capsule Take 2,000 Units by mouth at bedtime.               Discharge Care Instructions  (From admission, onward)           Start     Ordered   07/22/22 0000  No dressing needed        08'$ /04/23 0858            Follow-up Information  Armandina Gemma, MD. Schedule an appointment as soon as possible for a visit in 3 week(s).   Specialty: General Surgery Why: For wound re-check Contact information: [place] [address] [phone]                 Lindee Leason, [facility] Office: [phone]   Signed: Armandina Gemma 07/22/2022, 8:58 AM

## 2022-07-22 NOTE — Progress Notes (Signed)
Patient going to the bathroom and IV came out. When asked to insert new IV, patient said "I'm good. I don't need one. I ate fine yesterday."   If new IV is necessary, will discuss with patient.

## 2022-07-22 NOTE — Progress Notes (Signed)
Pathology all benign as expected.  Good news.  [place], MD [facility] [place] practice Office: [phone]

## 2022-07-22 NOTE — Progress Notes (Signed)
Transition of Care [facility]) Screening Note  Patient Details  Name: [name] Date of Birth: Aug 31, 1935  Transition of Care [facility]) CM/SW Contact:    Sherie Don, LCSW Phone Number: 07/22/2022, 9:40 AM  Transition of Care Department [facility]) has reviewed patient and no TOC needs have been identified at this time. We will continue to monitor patient advancement through interdisciplinary progression rounds. If new patient transition needs arise, please place a TOC consult.

## 2022-07-25 ENCOUNTER — Telehealth: Payer: PPO

## 2022-07-25 DIAGNOSIS — M6281 Muscle weakness (generalized): Secondary | ICD-10-CM | POA: Diagnosis not present

## 2022-07-26 DIAGNOSIS — M6281 Muscle weakness (generalized): Secondary | ICD-10-CM | POA: Diagnosis not present

## 2022-07-27 DIAGNOSIS — M6281 Muscle weakness (generalized): Secondary | ICD-10-CM | POA: Diagnosis not present

## 2022-07-28 DIAGNOSIS — M6281 Muscle weakness (generalized): Secondary | ICD-10-CM | POA: Diagnosis not present

## 2022-07-29 DIAGNOSIS — R41841 Cognitive communication deficit: Secondary | ICD-10-CM | POA: Diagnosis not present

## 2022-07-29 DIAGNOSIS — R488 Other symbolic dysfunctions: Secondary | ICD-10-CM | POA: Diagnosis not present

## 2022-08-01 ENCOUNTER — Ambulatory Visit (INDEPENDENT_AMBULATORY_CARE_PROVIDER_SITE_OTHER): Payer: PPO | Admitting: Family Medicine

## 2022-08-01 ENCOUNTER — Encounter: Payer: Self-pay | Admitting: Family Medicine

## 2022-08-01 VITALS — BP 120/80 | HR 63 | Temp 98.1°F | Ht 66.0 in | Wt 240.2 lb

## 2022-08-01 DIAGNOSIS — I10 Essential (primary) hypertension: Secondary | ICD-10-CM | POA: Diagnosis not present

## 2022-08-01 DIAGNOSIS — N184 Chronic kidney disease, stage 4 (severe): Secondary | ICD-10-CM

## 2022-08-01 DIAGNOSIS — M6281 Muscle weakness (generalized): Secondary | ICD-10-CM | POA: Diagnosis not present

## 2022-08-01 DIAGNOSIS — E210 Primary hyperparathyroidism: Secondary | ICD-10-CM | POA: Diagnosis not present

## 2022-08-01 NOTE — Progress Notes (Unsigned)
   Established Patient Office Visit  Subjective   Patient ID: [name], female    DOB: [birth year]  Age: 87 y.o. 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 Patient states that she is feeling fine since the surgery.     {History (Optional):23778}  Review of Systems  All other systems reviewed and are negative.     Objective:     BP 120/80   Pulse 63   Temp 98.1 F (36.7 C) (Oral)   Ht '5\' 6"'$  (1.676 m)   Wt 240 lb 3.2 oz (109 kg)   SpO2 98%   BMI 38.77 kg/m  {Vitals History (Optional):23777}  Physical Exam Vitals reviewed.  Constitutional:      Appearance: Normal appearance. She is well-groomed and normal weight.  HENT:     Head: Normocephalic and atraumatic.     Mouth/Throat:     Mouth: Mucous membranes are moist.     Pharynx: Oropharynx is clear.  Eyes:     Extraocular Movements: Extraocular movements intact.     Conjunctiva/sclera: Conjunctivae normal.     Pupils: Pupils are equal, round, and reactive to light.  Cardiovascular:     Rate and Rhythm: Normal rate and regular rhythm.     Pulses: Normal pulses.     Heart sounds: S1 normal and S2 normal.  Pulmonary:     Effort: Pulmonary effort is normal.     Breath sounds: Normal breath sounds and air entry.  Abdominal:     General: Abdomen is flat. Bowel sounds are normal.     Palpations: Abdomen is soft.  Musculoskeletal:        General: Normal range of motion.     Cervical back: Normal range of motion and neck supple.     Right lower leg: No edema.     Left lower leg: No edema.  Skin:    General: Skin is warm and dry.  Neurological:     Mental Status: She is alert and oriented to person, place, and time. Mental status is at baseline.     Gait: Gait is intact.  Psychiatric:        Mood and Affect: Mood and affect normal.        Speech: Speech normal.        Behavior: Behavior  normal.        Judgment: Judgment normal.      No results found for any visits on 08/01/22.  {Labs (Optional):23779}  The ASCVD Risk score (Arnett DK, et al., 2019) failed to calculate for the following reasons:   The 2019 ASCVD risk score is only valid for ages 42 to 110    Assessment & Plan:   Problem List Items Addressed This Visit       Cardiovascular and Mediastinum   Essential hypertension - Primary     Genitourinary   Chronic kidney disease (CKD), stage IV (severe) (HCC)   Relevant Orders   Basic Metabolic Panel    No follow-ups on file.    Farrel Conners, MD

## 2022-08-02 ENCOUNTER — Other Ambulatory Visit (INDEPENDENT_AMBULATORY_CARE_PROVIDER_SITE_OTHER): Payer: PPO

## 2022-08-02 DIAGNOSIS — N184 Chronic kidney disease, stage 4 (severe): Secondary | ICD-10-CM

## 2022-08-02 DIAGNOSIS — M6281 Muscle weakness (generalized): Secondary | ICD-10-CM | POA: Diagnosis not present

## 2022-08-02 NOTE — Assessment & Plan Note (Signed)
I reviewed her past labs over the last year, it has been slowly declining, I will order a follow up BMP to look for further worsening and I will forward the results to her nephrologist.

## 2022-08-02 NOTE — Assessment & Plan Note (Signed)
Current hypertension medications:      Sig   furosemide (LASIX) 40 MG tablet (Taking) Take 1.5 tablets (60 mg total) by mouth 2 (two) times daily.   metoprolol tartrate (LOPRESSOR) 25 MG tablet (Taking) TAKE ONE TABLET BY MOUTH BEFORE BREAKFAST and TAKE ONE TABLET BY MOUTH EVERYDAY AT BEDTIME    BP is well controlled on the above medications. She does no appear to be fluid overloaded at this time, continue these medications.

## 2022-08-02 NOTE — Assessment & Plan Note (Signed)
Reviewed her operative report and her pathology report. Reviewed documentation from the hospital stay. She will continue to follow with her ENT specialist. Checking BMP today in follow up of her calcium level.

## 2022-08-03 DIAGNOSIS — M6281 Muscle weakness (generalized): Secondary | ICD-10-CM | POA: Diagnosis not present

## 2022-08-03 DIAGNOSIS — R488 Other symbolic dysfunctions: Secondary | ICD-10-CM | POA: Diagnosis not present

## 2022-08-03 DIAGNOSIS — R41841 Cognitive communication deficit: Secondary | ICD-10-CM | POA: Diagnosis not present

## 2022-08-03 LAB — BASIC METABOLIC PANEL WITH GFR
BUN/Creatinine Ratio: 16 (calc) (ref 6–22)
BUN: 33 mg/dL — ABNORMAL HIGH (ref 7–25)
CO2: 23 mmol/L (ref 20–32)
Calcium: 8.3 mg/dL — ABNORMAL LOW (ref 8.6–10.4)
Chloride: 104 mmol/L (ref 98–110)
Creat: 2.08 mg/dL — ABNORMAL HIGH (ref 0.60–0.95)
Glucose, Bld: 88 mg/dL (ref 65–99)
Potassium: 3.7 mmol/L (ref 3.5–5.3)
Sodium: 143 mmol/L (ref 135–146)
eGFR: 23 mL/min/{1.73_m2} — ABNORMAL LOW (ref >=60)

## 2022-08-04 ENCOUNTER — Telehealth: Payer: Self-pay | Admitting: Pharmacist

## 2022-08-04 DIAGNOSIS — I10 Essential (primary) hypertension: Secondary | ICD-10-CM

## 2022-08-04 DIAGNOSIS — R488 Other symbolic dysfunctions: Secondary | ICD-10-CM | POA: Diagnosis not present

## 2022-08-04 DIAGNOSIS — R41841 Cognitive communication deficit: Secondary | ICD-10-CM | POA: Diagnosis not present

## 2022-08-04 MED ORDER — MIRABEGRON ER 50 MG PO TB24: 50.0000 mg | ORAL_TABLET | Freq: Every day | ORAL | 1 refills | Status: DC

## 2022-08-04 MED ORDER — SIMVASTATIN 20 MG PO TABS: ORAL_TABLET | ORAL | 1 refills | Status: DC

## 2022-08-04 MED ORDER — METOPROLOL TARTRATE 25 MG PO TABS: ORAL_TABLET | ORAL | 1 refills | Status: AC

## 2022-08-04 NOTE — Telephone Encounter (Signed)
Rx done. 

## 2022-08-04 NOTE — Telephone Encounter (Signed)
Ok to refill her medications listed above.

## 2022-08-04 NOTE — Telephone Encounter (Signed)
Received communication from [facility] that patient needs refills of Myrbetriq, metoprolol and simvastatin. Patient changed PCPs so unable to request the usual way. Routing to [facility] to send in refills.

## 2022-08-05 ENCOUNTER — Telehealth: Payer: Self-pay | Admitting: Pharmacist

## 2022-08-05 DIAGNOSIS — M6281 Muscle weakness (generalized): Secondary | ICD-10-CM | POA: Diagnosis not present

## 2022-08-05 NOTE — Chronic Care Management (AMB) (Signed)
    Chronic Care Management Pharmacy Assistant   Name: [name]  MRN: [id number] DOB: 1935/01/28  08/08/2022 APPOINTMENT REMINDER  Called [name], No answer, left message on daughters phone of appointment on 08/08/2022 at 4:00 via telephone visit with [name], Pharm D. Notified to have all medications, supplements, blood pressure and/or blood sugar logs available during appointment and to return call if need to reschedule.  Care Gaps: AWV - scheduled for 03/23/2023 Last BP - 120/80 on 08/01/2022 Last A1C - 5.1 on 01/28/2022 Flu - due Covid booster - postponed Mammogram - postponed Tdap - postponed  Star Rating Drug: Simvastatin '20mg'$  - last filled 07/12/2022 30 DS at [facility]  Any gaps in medications fill history? No  [name] [facility]  Catering manager [phone]

## 2022-08-08 ENCOUNTER — Ambulatory Visit: Payer: PPO | Admitting: Pharmacist

## 2022-08-08 DIAGNOSIS — R600 Localized edema: Secondary | ICD-10-CM

## 2022-08-08 DIAGNOSIS — M6281 Muscle weakness (generalized): Secondary | ICD-10-CM | POA: Diagnosis not present

## 2022-08-08 DIAGNOSIS — I10 Essential (primary) hypertension: Secondary | ICD-10-CM

## 2022-08-08 NOTE — Patient Instructions (Signed)
Hi [name] and [name],  It was great to get to speak with you over the telephone! I will communicate the changes that we made for timing of medications to the pharmacy.  Don't forget to start checking blood pressure together so we can make sure your readings are in a good spot and your medications are working properly.  Please reach out to me if you have any questions or need anything!  Best, Maddie  [name], PharmD, [facility]   Visit Information   Goals Addressed   None    Patient Care Plan: CCM Pharmacy Care Plan     Problem Identified: Problem: Hypertension, Hyperlipidemia, GERD, Asthma, Chronic Kidney Disease, Hypothyroidism, Osteoarthritis and Overactive Bladder      Long-Range Goal: Patient-Specific Goal   Start Date: 03/12/2021  Expected End Date: 03/12/2022  Recent Progress: On track  Priority: High  Note:   Current Barriers:  Unable to independently monitor therapeutic efficacy Unable to self administer medications as prescribed Does not maintain contact with provider office  Pharmacist Clinical Goal(s):  Patient will achieve adherence to monitoring guidelines and medication adherence to achieve therapeutic efficacy maintain control of blood pressure as evidenced by home and office BP readings  achieve ability to self administer medications as prescribed through use of use of a timer as evidenced by patient report through collaboration with PharmD and provider.   Interventions: 1:1 collaboration with Caren Macadam, MD regarding development and update of comprehensive plan of care as evidenced by provider attestation and co-signature Inter-disciplinary care team collaboration (see longitudinal plan of care) Comprehensive medication review performed; medication list updated in electronic medical record  Hypertension (BP goal <140/90) -Controlled -Current treatment: Furosemide 40 mg 1.5  tablets twice daily - Appropriate, Query effective, Safe, Accessible Metoprolol 25 mg 1 tablet twice daily - Appropriate, Query effective, Safe, Accessible -Medications previously tried: amlodipine, benazepril, Benicar, HCTZ, losartan, olmesartan -Current home readings: does not check at home often -Current dietary habits: limiting salt intake -Current exercise habits: no structured exercise -Denies hypotensive/hypertensive symptoms -Educated on Exercise goal of 150 minutes per week; Importance of home blood pressure monitoring; -Counseled to monitor BP at home weekly, document, and provide log at future appointments -Recommended to continue current medication  Hyperlipidemia: (LDL goal < 100) -Controlled -Current treatment: simvastatin '20mg'$ , 1 tablet at bedtime - Appropriate, Effective, Query Safe, Accessible -Medications previously tried: none  -Current dietary patterns: patient reports diet is not as good as it should be -Current exercise habits: we discussed adding in walking (15-20 minutes) a few days per week and patient thinks she will be able to do this - [place] -Educated on Cholesterol goals;  Importance of limiting foods high in cholesterol; Exercise goal of 150 minutes per week; -Recommended to continue current medication Recommended switching cholesterol medication given kidney function.  Asthma (Goal: control symptoms) -Controlled -Current treatment  montelukast '10mg'$ , 1 tablet daily - Appropriate, Effective, Safe, Accessible albuterol HFA inhaler (has not needed to use over a year)  Symbicort 1 puff as needed - Appropriate, Effective, Safe, Accessible -Medications previously tried: Qvar, Dulera  -Exacerbations requiring treatment in last 6 months: none -Patient denies consistent use of maintenance inhaler -Frequency of rescue inhaler use: not often -Counseled on When to use rescue inhaler -Recommended to continue current  medication   Hypothyroidism (Goal: TSH 0.35-4.5) -Not ideally controlled -Current treatment  levothyroxine 150 mcg, 1 tablet before breakfast and 1 and 1/2 tablet once weekly  on Mondays - Appropriate, Query effective, Safe, Accessible -Medications previously tried: none  -Counseled on taking the medication on an empty stomach separate from all other medications  GERD (Goal: minimize symptoms) -Controlled -Current treatment  Esomeprazole 20 mg 1 capsule as needed - Appropriate, Effective, Safe, Accessible -Medications previously tried: none  -Counseled on non-pharmacological interventions for acid reflux. Take measures to prevent acid reflux, such as avoiding spicy foods, avoiding caffeine, avoid laying down a few hours after eating, and raising the head of the bed  Pain (Goal: minimize pain) -Controlled -Current treatment  Tylenol 325 mg as needed - Appropriate, Effective, Safe, Accessible -Medications previously tried: Tramadol (nauseous)  -Recommended to continue current medication  Overactive bladder (Goal: minimize symptoms) -Not ideally controlled -Current treatment  Myrbetriq 50 mg, 1 tablet daily - Appropriate, Effective, Query Safe, Accessible -Medications previously tried: Vesicare (solifenacin), Detrol LA (tolterodine) -Recommended to continue current medication  Leg swelling (Goal: minimize swelling of lower extremities) -Controlled -Current treatment  furosemide '20mg'$ ,  1.5 tablets twice daly - Appropriate, Effective, Safe, Accessible -Medications previously tried: none  -Recommended to continue current medication Recommended taking second dose of furosemide around lunch time to avoid frequent nighttime urination.  Vitamin D deficiency (Goal: vitamin D 30-100) -Uncontrolled -Current treatment  Vitamin D 2000 units daily Appropriate, Query effective, Safe, Accessible -Medications previously tried: n/a  -Recommended to continue current medication   Health  Maintenance -Vaccine gaps: none -Current therapy:  No medications -Educated on Cost vs benefit of each product must be carefully weighed by individual consumer -Patient is satisfied with current therapy and denies issues -Recommended to continue as is  Patient Goals/Self-Care Activities Patient will:  - take medications as prescribed focus on medication adherence by setting a timer to take your medications check blood pressure weekly, document, and provide at future appointments  Follow Up Plan: Telephone follow up appointment with care management team member scheduled for: 6 months       Patient verbalizes understanding of instructions and care plan provided today and agrees to view in [place]. Active MyChart status and patient understanding of how to access instructions and care plan via MyChart confirmed with patient.    The pharmacy team will reach out to the patient again over the next 60 days.   Viona Gilmore, [facility]

## 2022-08-08 NOTE — Progress Notes (Signed)
Chronic Care Management Pharmacy Note  08/08/2022 Name:  [name] MRN:  [id number] DOB:  [birth year]  Summary: Pt's kidney function is declining Pt is more adherent with medications with the help of her daughter  Recommendations/Changes made from today's visit: -Recommend decreasing Myrbetriq dose to 25 mg/day given kidney function -Recommend switching simvastatin 20 mg to atorvastatin 10 mg given kidney function -Recommended moving second dose of furosemide to the afternoon instead of evening -Recommended monitoring of BP at home  Plan: Follow up BP assessment in 2-3 months   Subjective: [name] is an 87 y.o. year old female who is a primary patient of Sherri Como Royston Cowper, MD.  The CCM team was consulted for assistance with disease management and care coordination needs.    Engaged with patient by telephone for follow up visit in response to provider referral for pharmacy case management and/or care coordination services.   Consent to Services:  The patient was given information about Chronic Care Management services, agreed to services, and gave verbal consent prior to initiation of services.  Please see initial visit note for detailed documentation.   Patient Care Team: Farrel Conners, MD as PCP - General (Family Medicine) Jamal Maes, MD as Consulting Physician (Nephrology) Viona Gilmore, [facility] as Pharmacist (Pharmacist) Madelon Lips, MD as Consulting Physician (Nephrology)  Recent office visits: 08/01/22 Sherri Freshwater, MD: Patient presented to establish care. No medication changes.  04/20/2022 Sherri Rough MD - Patient was seen for Chronic kidney disease (CKD), stage IV and additional issue. Increase the lasix to 1.5 tablets ($RemoveBefo'60mg'oNTcDnQKxbf$  total dose) twice daily. If you are not seeing increased urination after taking that dose, then increase to 2 tablets twice daily. If you don't see increased urination with that then call and let me know. No follow up  noted.  04/05/2022 Sherri Penna MD - Patient was seen for chronic kidney disease stage 4 and additional issues. No medication changes. No follow up noted.   03/21/2022 Sherri Arbour LPN - Medicare annual wellness exam.   Recent consult visits: 06/17/22 [name], OT: Patient presented for OT session. Unable to access notes.  06/16/22 [name], PT: Patient presented for PT session. Unable to access notes.  03/01/22 Sherri Guys, MD (eye care): Patient presented for eye exam.  02/15/2022 Sherri Gemma MD (Gen Surgery) - Patient was seen for Primary hyperparathyroidism. No medication changes. No follow up noted.   Hospital visits: 07/21/22-07/22/22 Patient admitted for thyroid and parathyroid surgery.  Follow up with Dr. Armandina Armstrong in 3 weeks.  Objective:  Lab Results  Component Value Date   CREATININE 2.08 (H) 08/02/2022   BUN 33 (H) 08/02/2022   GFR 25.20 (L) 04/27/2022   GFRNONAA 23 (L) 07/22/2022   GFRAA 32 (L) 11/06/2015   NA 143 08/02/2022   K 3.7 08/02/2022   CALCIUM 8.3 (L) 08/02/2022   CO2 23 08/02/2022   GLUCOSE 88 08/02/2022    Lab Results  Component Value Date/Time   HGBA1C 5.1 01/28/2022 10:59 AM   GFR 25.20 (L) 04/27/2022 02:42 PM   GFR 26.61 (L) 04/05/2022 02:43 PM    Last diabetic Eye exam: No results found for: "HMDIABEYEEXA"  Last diabetic Foot exam: No results found for: "HMDIABFOOTEX"   Lab Results  Component Value Date   CHOL 153 01/28/2022   HDL 55.50 01/28/2022   LDLCALC 80 01/28/2022   LDLDIRECT 174.2 07/09/2007   TRIG 86.0 01/28/2022   CHOLHDL 3 01/28/2022       Latest Ref  Rng & Units 01/28/2022   10:59 AM 04/28/2021    1:41 PM 11/25/2020   11:33 AM  Hepatic Function  Total Protein 6.0 - 8.3 g/dL 6.1  5.9  6.1   Albumin 3.5 - 5.2 g/dL 3.4  3.7    AST 0 - 37 U/L $Remo'16  12  12   'FChGD$ ALT 0 - 35 U/L $Remo'13  8  6   'LVqcI$ Alk Phosphatase 39 - 117 U/L 110  137    Total Bilirubin 0.2 - 1.2 mg/dL 0.8  0.7  0.8     Lab Results  Component Value Date/Time   TSH  5.42 01/28/2022 10:59 AM   TSH 4.49 04/28/2021 01:41 PM       Latest Ref Rng & Units 07/18/2022    2:13 PM 01/28/2022   10:59 AM 04/28/2021    1:41 PM  CBC  WBC 4.0 - 10.5 K/uL 4.8  5.2  5.9   Hemoglobin 12.0 - 15.0 g/dL 13.5  14.8  14.9   Hematocrit 36.0 - 46.0 % 41.3  45.6  44.7   Platelets 150 - 400 K/uL 180  199.0  202.0     Lab Results  Component Value Date/Time   VD25OH 16.74 (L) 04/28/2021 01:41 PM   VD25OH 22 (L) 07/29/2020 11:52 AM   VD25OH 15.35 (L) 01/29/2020 12:28 PM    Clinical ASCVD: No  The ASCVD Risk score (Arnett DK, et al., 2019) failed to calculate for the following reasons:   The 2019 ASCVD risk score is only valid for ages 66 to 57       08/01/2022    3:58 PM 04/20/2022    1:53 PM 03/21/2022    2:57 PM  Depression screen PHQ 2/9  Decreased Interest 0 0 0  Down, Depressed, Hopeless 0 0 0  PHQ - 2 Score 0 0 0  Altered sleeping 0 0 0  Tired, decreased energy 2 2 0  Change in appetite 1 0 0  Feeling bad or failure about yourself  0 0 0  Trouble concentrating 0 0 0  Moving slowly or fidgety/restless 0 0 0  Suicidal thoughts 0 0 0  PHQ-9 Score 3 2 0      Social History   Tobacco Use  Smoking Status Former   Packs/day: 0.25   Years: 6.00   Total pack years: 1.50   Types: Cigarettes   Quit date: 12/19/1978   Years since quitting: 43.6  Smokeless Tobacco Never   BP Readings from Last 3 Encounters:  08/01/22 120/80  07/22/22 (!) 153/86  07/18/22 (!) 132/58   Pulse Readings from Last 3 Encounters:  08/01/22 63  07/22/22 66  07/18/22 (!) 52   Wt Readings from Last 3 Encounters:  08/01/22 240 lb 3.2 oz (109 kg)  07/21/22 241 lb 2 oz (109.4 kg)  04/20/22 239 lb 4.8 oz (108.5 kg)   BMI Readings from Last 3 Encounters:  08/01/22 38.77 kg/m  07/21/22 38.92 kg/m  07/18/22 38.62 kg/m    Assessment/Interventions: Review of patient past medical history, allergies, medications, health status, including review of consultants reports, laboratory  and other test data, was performed as part of comprehensive evaluation and provision of chronic care management services.   SDOH:  (Social Determinants of Health) assessments and interventions performed: Yes  SDOH Screenings   Alcohol Screen: Low Risk  (04/05/2022)   Alcohol Screen    Last Alcohol Screening Score (AUDIT): 1  Depression (PHQ2-9): Low Risk  (08/01/2022)   Depression (PHQ2-9)  PHQ-2 Score: 3  Financial Resource Strain: Low Risk  (04/05/2022)   Overall Financial Resource Strain (CARDIA)    Difficulty of Paying Living Expenses: Not very hard  Food Insecurity: No Food Insecurity (04/05/2022)   Hunger Vital Sign    Worried About Running Out of Food in the Last Year: Never true    Ran Out of Food in the Last Year: Never true  Housing: Low Risk  (04/05/2022)   Housing    Last Housing Risk Score: 0  Physical Activity: Inactive (04/05/2022)   Exercise Vital Sign    Days of Exercise per Week: 0 days    Minutes of Exercise per Session: 0 min  Social Connections: Unknown (04/05/2022)   Social Connection and Isolation Panel [NHANES]    Frequency of Communication with Friends and Family: Three times a week    Frequency of Social Gatherings with Friends and Family: Once a week    Attends Religious Services: Patient refused    Active Member of Clubs or Organizations: No    Attends Music therapist: More than 4 times per year    Marital Status: Widowed  Stress: Stress Concern Present (04/05/2022)   [place]    Feeling of Stress : To some extent  Tobacco Use: Medium Risk (08/01/2022)   Patient History    Smoking Tobacco Use: Former    Smokeless Tobacco Use: Never    Passive Exposure: Not on file  Transportation Needs: No Transportation Needs (04/05/2022)   PRAPARE - Hydrologist (Medical): No    Lack of Transportation (Non-Medical): No    CCM Care Plan  No Known  Allergies  Medications Reviewed Today     Reviewed by Viona Gilmore, [facility] (Pharmacist) on 08/08/22 at 1610  Med List Status: <None>   Medication Order Taking? Sig Documenting Provider Last Dose Status Informant  Cholecalciferol (VITAMIN D3) 50 MCG (2000 UT) capsule [id number] Yes Take 2,000 Units by mouth at bedtime. [provider] Taking Active Family Member  DULoxetine (CYMBALTA) 30 MG capsule [id number] Yes TAKE ONE CAPSULE BY MOUTH BEFORE BREAKFAST Koberlein, Junell C, MD Taking Active Family Member  furosemide (LASIX) 40 MG tablet [id number] Yes Take 1.5 tablets (60 mg total) by mouth 2 (two) times daily. Caren Macadam, MD Taking Active Family Member  levothyroxine (SYNTHROID) 150 MCG tablet [id number] Yes TAKE 1 AND 1/2 TABLETS BY MOUTH ONCE WEEKLY BEFORE BREAKFAST ON MONDAY and TAKE ONE TABLET BY MOUTH ONCE WEEKLY BEFORE BREAKFAST ON  TUESDAYS and TAKE ONE TABLET BY MOUTH ONCE WEEKLY BEFORE BREAKFAST ON WEDNESDAYS and TAKE ONE TABLET BY MOUTH ONCE WEEKLY BEFORE BREAKFAST ON THURSDAYS and TAKE ONE TABLET BY MOUTH ONCE WEEKLY BEFORE BREAKFAST ON FRIDAYS and TAKE ONE TABLET BY MOUTH ONCE WEEKLY BEFORE BREAKFAST ON SATURDAY and TAKE ONE TABLET BY MOUTH ONCE WEEKLY BEFORE BREAKFAST ON SUNDAYS Caren Macadam, MD Taking Active Family Member           Med Note Wilmon Pali, MELISSA R   Thu Jul 14, 2022  9:48 AM)    metoprolol tartrate (LOPRESSOR) 25 MG tablet [id number] Yes TAKE ONE TABLET BY MOUTH BEFORE BREAKFAST and TAKE ONE TABLET BY MOUTH EVERYDAY AT BEDTIME Farrel Conners, MD Taking Active   mirabegron ER (MYRBETRIQ) 50 MG TB24 tablet [id number] Yes Take 1 tablet (50 mg total) by mouth daily. Farrel Conners, MD Taking Active   montelukast (SINGULAIR) 10 MG tablet [id number] Yes TAKE  ONE TABLET BY MOUTH BEFORE BREAKFAST Caren Macadam, MD Taking Active Family Member  simvastatin (ZOCOR) 20 MG tablet [id number] Yes TAKE ONE TABLET BY MOUTH EVERYDAY AT BEDTIME Farrel Conners, MD Taking Active   SYMBICORT 80-4.5 MCG/ACT inhaler [id number] Yes INHALE TWO PUFFS BY MOUTH INTO LUNGS twice daily  Patient taking differently: Inhale 2 puffs into the lungs 2 (two) times daily as needed (asthma).   Caren Macadam, MD Taking Active Family Member  traMADol (ULTRAM) 50 MG tablet [id number]  Take 1-2 tablets (50-100 mg total) by mouth every 6 (six) hours as needed for moderate pain. Sherri Gemma, MD  Active             Patient Active Problem List   Diagnosis Date Noted   Primary hyperparathyroidism ([place]) 07/21/2022   Hyperparathyroidism, primary ([place]) 07/17/2022   Chronic kidney disease (CKD), stage IV (severe) ([place]) 02/01/2021   Cough variant asthma 09/23/2015   Chronic cough 04/09/2015   Acute renal failure superimposed on stage 4 chronic kidney disease ([place]) 04/09/2015   Obesity 11/22/2011   History of colonic polyps 11/06/2009   NEPHROLITHIASIS, HX OF 08/07/2008   Osteoarthritis 05/07/2008   Disorder resulting from impaired renal function 05/06/2008   DOE (dyspnea on exertion) 05/06/2008   HYPERCHOLESTEROLEMIA, PURE 07/09/2007   Hypothyroidism 06/18/2007   Essential hypertension 06/18/2007   Asthma 06/18/2007   GERD 06/18/2007    Immunization History  Administered Date(s) Administered   Fluad Quad(high Dose 65+) 09/26/2019, 11/18/2020   Influenza Split 09/20/2011, 09/12/2012   Influenza Whole 10/19/2006, 10/18/2007, 09/19/2008, 10/02/2010   Influenza, High Dose Seasonal PF 08/21/2015, 09/08/2016, 09/05/2017, 10/12/2018   Influenza,inj,Quad PF,6+ Mos 10/04/2013, 09/12/2014   Influenza-Unspecified 08/20/2019, 10/19/2021   PFIZER(Purple Top)SARS-COV-2 Vaccination 01/07/2020, 01/27/2020, 10/09/2020   PNEUMOCOCCAL CONJUGATE-20 01/28/2022   Pneumococcal Conjugate-13 07/01/2014   Pneumococcal Polysaccharide-23 09/18/2004   Tdap 11/22/2011   Zoster Recombinat (Shingrix) 09/26/2019, 12/17/2019   Zoster, Live 05/06/2008    Patient does get up  to the bathroom a few times during the night and inquired if the Myrbetriq and furosemide are in conflict with one another.   [place] in [place] and her daughter comes to check on her daily. She reports she checks her BP and it is "fine" but unable to define this or aware of her readings. Patient's daughter will bring over a cuff tomorrow and start to check her BP at least once a week.  Conditions to be addressed/monitored:  Hypertension, Hyperlipidemia, GERD, Asthma, Chronic Kidney Disease, Hypothyroidism, Osteoarthritis and Overactive Bladder  Conditions addressed this visit: Hypertension, overactive bladder  Care Plan : CCM Pharmacy Care Plan  Updates made by Viona Gilmore, [place] since 08/08/2022 12:00 AM     Problem: Problem: Hypertension, Hyperlipidemia, GERD, Asthma, Chronic Kidney Disease, Hypothyroidism, Osteoarthritis and Overactive Bladder      Long-Range Goal: Patient-Specific Goal   Start Date: 03/12/2021  Expected End Date: 03/12/2022  Recent Progress: On track  Priority: High  Note:   Current Barriers:  Unable to independently monitor therapeutic efficacy Unable to self administer medications as prescribed Does not maintain contact with provider office  Pharmacist Clinical Goal(s):  Patient will achieve adherence to monitoring guidelines and medication adherence to achieve therapeutic efficacy maintain control of blood pressure as evidenced by home and office BP readings  achieve ability to self administer medications as prescribed through use of use of a timer as evidenced by patient report through collaboration with PharmD and provider.   Interventions: 1:1 collaboration  with Caren Macadam, MD regarding development and update of comprehensive plan of care as evidenced by provider attestation and co-signature Inter-disciplinary care team collaboration (see longitudinal plan of care) Comprehensive medication review performed; medication list  updated in electronic medical record  Hypertension (BP goal <140/90) -Controlled -Current treatment: Furosemide 40 mg 1.5 tablets twice daily - Appropriate, Query effective, Safe, Accessible -Medications previously tried: amlodipine, benazepril, Benicar, HCTZ, losartan, olmesartan -Current home readings: does not check at home often -Current dietary habits: limiting salt intake -Current exercise habits: no structured exercise -Denies hypotensive/hypertensive symptoms -Educated on Exercise goal of 150 minutes per week; Importance of home blood pressure monitoring; -Counseled to monitor BP at home weekly, document, and provide log at future appointments -Recommended to continue current medication  Hyperlipidemia: (LDL goal < 100) -Controlled -Current treatment: simvastatin 20mg , 1 tablet at bedtime - Appropriate, Effective, Query Safe, Accessible -Medications previously tried: none  -Current dietary patterns: patient reports diet is not as good as it should be -Current exercise habits: we discussed adding in walking (15-20 minutes) a few days per week and patient thinks she will be able to do this - [place] neighborhood -Educated on Cholesterol goals;  Importance of limiting foods high in cholesterol; Exercise goal of 150 minutes per week; -Recommended to continue current medication Recommended switching cholesterol medication given kidney function.  Asthma (Goal: control symptoms) -Controlled -Current treatment  montelukast 10mg , 1 tablet daily - Appropriate, Effective, Safe, Accessible albuterol HFA inhaler (has not needed to use over a year)  Symbicort 1 puff as needed - Appropriate, Effective, Safe, Accessible -Medications previously tried: Qvar, Dulera  -Exacerbations requiring treatment in last 6 months: none -Patient denies consistent use of maintenance inhaler -Frequency of rescue inhaler use: not often -Counseled on When to use rescue  inhaler -Recommended to continue current medication   Hypothyroidism (Goal: TSH 0.35-4.5) -Not ideally controlled -Current treatment  levothyroxine 150 mcg, 1 tablet before breakfast and 1 and 1/2 tablet once weekly on Mondays - Appropriate, Query effective, Safe, Accessible -Medications previously tried: none  -Counseled on taking the medication on an empty stomach separate from all other medications  GERD (Goal: minimize symptoms) -Controlled -Current treatment  Esomeprazole 20 mg 1 capsule as needed - Appropriate, Effective, Safe, Accessible -Medications previously tried: none  -Counseled on non-pharmacological interventions for acid reflux. Take measures to prevent acid reflux, such as avoiding spicy foods, avoiding caffeine, avoid laying down a few hours after eating, and raising the head of the bed  Pain (Goal: minimize pain) -Controlled -Current treatment  Tylenol 325 mg as needed - Appropriate, Effective, Safe, Accessible -Medications previously tried: Tramadol (nauseous)  -Recommended to continue current medication  Overactive bladder (Goal: minimize symptoms) -Not ideally controlled -Current treatment  Myrbetriq 50 mg, 1 tablet daily - Appropriate, Effective, Query Safe, Accessible -Medications previously tried: Vesicare (solifenacin), Detrol LA (tolterodine) -Recommended to continue current medication  Leg swelling (Goal: minimize swelling of lower extremities) -Controlled -Current treatment  furosemide 20mg ,  1.5 tablets twice daly - Appropriate, Effective, Safe, Accessible -Medications previously tried: none  -Recommended to continue current medication Recommended taking second dose of furosemide around lunch time to avoid frequent nighttime urination.  Vitamin D deficiency (Goal: vitamin D 30-100) -Uncontrolled -Current treatment  Vitamin D 2000 units daily Appropriate, Query effective, Safe, Accessible -Medications previously tried: n/a  -Recommended to  continue current medication   Health Maintenance -Vaccine gaps: none -Current therapy:  No medications -Educated on Cost vs benefit of each product must be  carefully weighed by individual consumer -Patient is satisfied with current therapy and denies issues -Recommended to continue as is  Patient Goals/Self-Care Activities Patient will:  - take medications as prescribed focus on medication adherence by setting a timer to take your medications check blood pressure weekly, document, and provide at future appointments  Follow Up Plan: Telephone follow up appointment with care management team member scheduled for: 6 months      Medication Assistance: None required.  Patient affirms current coverage meets needs.  Care Gaps: COVID booster, influenza, mammogram, tetanus Last BP - 120/80 on 08/01/2022   Star Rating Drug: Simvastatin 20mg  - last filled 07/12/2022 30 DS at [facility]  Patient's preferred pharmacy is:  [facility] [address] [place] - [address] [place] [address] Phone: [phone] Fax: [phone]  [facility]) [place], [place] [address] Phone: [phone] Fax: [phone]  [id number] - [place], [place] - [address] [place] [address] [place] [address] Phone: [phone] Fax: [phone]  [facility] - [place], [place] - [address] [place] [address] Phone: [phone] Fax: [phone]   Uses pill box? No - adherence packaging Pt endorses 80% compliance  We discussed: Benefits of medication synchronization, packaging and delivery as well as enhanced pharmacist oversight with [facility]. Patient decided to: Utilize [facility] pharmacy for medication synchronization, packaging and delivery  Care Plan and Follow Up Patient Decision:   Patient agrees to Care Plan and Follow-up.  Plan: Telephone follow up appointment with care management team member scheduled for:  6 months  [name], PharmD BCACP Clinical Pharmacist [place] [facility] [place] [phone]  Patient is due for next adherence delivery on: 08/17/2022   Reviewed medications and coordinated delivery   This delivery to include: Furosemide 20 mg: Take 3 tablets before breakfast and at lunch Vitamin D 37mcg (2000 iu): take 1 tablet at bedtime Simvastatin 20 mg: take 1 tablet at bedtime Metoprolol tartrate 25mg : take 1 tablet before breakfast and 1 tablet at bedtime Levothyroxine 150 mcg: take 1.5 tablet before breakfast on Mondays and 1 tablet before breakfast on all other days Montelukast 10 mg: take 1 tablet before breakfast Duloxetine 30 mg: take 1 tablet before breakfast Myrbetriq: take 1 tablet at bedtime   Patient will need a short fill: No short fill needed   Coordinated acute fill: No short fill needed   Patient's daughter declined the following medications: Symbicort 80/4.5- 2 puffs into the lungs twice daily as needed (uses PRN)

## 2022-08-09 DIAGNOSIS — M6281 Muscle weakness (generalized): Secondary | ICD-10-CM | POA: Diagnosis not present

## 2022-08-10 ENCOUNTER — Other Ambulatory Visit: Payer: Self-pay | Admitting: Family Medicine

## 2022-08-10 DIAGNOSIS — R488 Other symbolic dysfunctions: Secondary | ICD-10-CM | POA: Diagnosis not present

## 2022-08-10 DIAGNOSIS — M6281 Muscle weakness (generalized): Secondary | ICD-10-CM | POA: Diagnosis not present

## 2022-08-10 DIAGNOSIS — R32 Unspecified urinary incontinence: Secondary | ICD-10-CM

## 2022-08-10 DIAGNOSIS — R41841 Cognitive communication deficit: Secondary | ICD-10-CM | POA: Diagnosis not present

## 2022-08-10 DIAGNOSIS — E7800 Pure hypercholesterolemia, unspecified: Secondary | ICD-10-CM

## 2022-08-10 MED ORDER — MIRABEGRON ER 25 MG PO TB24: 25.0000 mg | ORAL_TABLET | Freq: Every day | ORAL | 1 refills | Status: DC

## 2022-08-10 MED ORDER — ATORVASTATIN CALCIUM 10 MG PO TABS: 10.0000 mg | ORAL_TABLET | Freq: Every day | ORAL | 1 refills | Status: DC

## 2022-08-11 DIAGNOSIS — E890 Postprocedural hypothyroidism: Secondary | ICD-10-CM | POA: Diagnosis not present

## 2022-08-11 DIAGNOSIS — M6281 Muscle weakness (generalized): Secondary | ICD-10-CM | POA: Diagnosis not present

## 2022-08-11 DIAGNOSIS — E210 Primary hyperparathyroidism: Secondary | ICD-10-CM | POA: Diagnosis not present

## 2022-08-11 DIAGNOSIS — E892 Postprocedural hypoparathyroidism: Secondary | ICD-10-CM | POA: Diagnosis not present

## 2022-08-12 ENCOUNTER — Telehealth: Payer: Self-pay | Admitting: Pharmacist

## 2022-08-12 NOTE — Telephone Encounter (Signed)
Called patient's daughter back about medication changes from CCM visit this week. Left a voicemail requesting a call back.

## 2022-08-15 DIAGNOSIS — R41841 Cognitive communication deficit: Secondary | ICD-10-CM | POA: Diagnosis not present

## 2022-08-15 DIAGNOSIS — M6281 Muscle weakness (generalized): Secondary | ICD-10-CM | POA: Diagnosis not present

## 2022-08-15 DIAGNOSIS — R488 Other symbolic dysfunctions: Secondary | ICD-10-CM | POA: Diagnosis not present

## 2022-08-16 DIAGNOSIS — R41841 Cognitive communication deficit: Secondary | ICD-10-CM | POA: Diagnosis not present

## 2022-08-16 DIAGNOSIS — R488 Other symbolic dysfunctions: Secondary | ICD-10-CM | POA: Diagnosis not present

## 2022-08-17 DIAGNOSIS — M6281 Muscle weakness (generalized): Secondary | ICD-10-CM | POA: Diagnosis not present

## 2022-08-18 DIAGNOSIS — M6281 Muscle weakness (generalized): Secondary | ICD-10-CM | POA: Diagnosis not present

## 2022-08-23 DIAGNOSIS — M6281 Muscle weakness (generalized): Secondary | ICD-10-CM | POA: Diagnosis not present

## 2022-08-23 DIAGNOSIS — R41841 Cognitive communication deficit: Secondary | ICD-10-CM | POA: Diagnosis not present

## 2022-08-23 DIAGNOSIS — R488 Other symbolic dysfunctions: Secondary | ICD-10-CM | POA: Diagnosis not present

## 2022-08-26 DIAGNOSIS — M6281 Muscle weakness (generalized): Secondary | ICD-10-CM | POA: Diagnosis not present

## 2022-08-29 DIAGNOSIS — M6281 Muscle weakness (generalized): Secondary | ICD-10-CM | POA: Diagnosis not present

## 2022-08-30 DIAGNOSIS — M6281 Muscle weakness (generalized): Secondary | ICD-10-CM | POA: Diagnosis not present

## 2022-08-31 DIAGNOSIS — M6281 Muscle weakness (generalized): Secondary | ICD-10-CM | POA: Diagnosis not present

## 2022-08-31 DIAGNOSIS — R41841 Cognitive communication deficit: Secondary | ICD-10-CM | POA: Diagnosis not present

## 2022-08-31 DIAGNOSIS — R488 Other symbolic dysfunctions: Secondary | ICD-10-CM | POA: Diagnosis not present

## 2022-09-02 DIAGNOSIS — M6281 Muscle weakness (generalized): Secondary | ICD-10-CM | POA: Diagnosis not present

## 2022-09-02 NOTE — Telephone Encounter (Signed)
Patient's daughter is aware of the medication changes with the pharmacy due to her kidney function.

## 2022-09-05 ENCOUNTER — Telehealth: Payer: Self-pay | Admitting: Pharmacist

## 2022-09-05 DIAGNOSIS — R41841 Cognitive communication deficit: Secondary | ICD-10-CM | POA: Diagnosis not present

## 2022-09-05 DIAGNOSIS — R488 Other symbolic dysfunctions: Secondary | ICD-10-CM | POA: Diagnosis not present

## 2022-09-05 NOTE — Chronic Care Management (AMB) (Signed)
Chronic Care Management Pharmacy Assistant   Name: [name]  MRN: [id number] DOB: Nov 15, 1935  Reason for Encounter: Medication Review / Medication Coordination Call  Recent office visits:  None  Recent consult visits:  None  Hospital visits:  None  Medications: Outpatient Encounter Medications as of 09/05/2022  Medication Sig   atorvastatin (LIPITOR) 10 MG tablet Take 1 tablet (10 mg total) by mouth daily.   Cholecalciferol (VITAMIN D3) 50 MCG (2000 UT) capsule Take 2,000 Units by mouth at bedtime.   DULoxetine (CYMBALTA) 30 MG capsule TAKE ONE CAPSULE BY MOUTH BEFORE BREAKFAST   furosemide (LASIX) 40 MG tablet Take 1.5 tablets (60 mg total) by mouth 2 (two) times daily.   levothyroxine (SYNTHROID) 150 MCG tablet TAKE 1 AND 1/2 TABLETS BY MOUTH ONCE WEEKLY BEFORE BREAKFAST ON MONDAY and TAKE ONE TABLET BY MOUTH ONCE WEEKLY BEFORE BREAKFAST ON  TUESDAYS and TAKE ONE TABLET BY MOUTH ONCE WEEKLY BEFORE BREAKFAST ON WEDNESDAYS and TAKE ONE TABLET BY MOUTH ONCE WEEKLY BEFORE BREAKFAST ON THURSDAYS and TAKE ONE TABLET BY MOUTH ONCE WEEKLY BEFORE BREAKFAST ON FRIDAYS and TAKE ONE TABLET BY MOUTH ONCE WEEKLY BEFORE BREAKFAST ON SATURDAY and TAKE ONE TABLET BY MOUTH ONCE WEEKLY BEFORE BREAKFAST ON SUNDAYS   metoprolol tartrate (LOPRESSOR) 25 MG tablet TAKE ONE TABLET BY MOUTH BEFORE BREAKFAST and TAKE ONE TABLET BY MOUTH EVERYDAY AT BEDTIME   mirabegron ER (MYRBETRIQ) 25 MG TB24 tablet Take 1 tablet (25 mg total) by mouth daily.   montelukast (SINGULAIR) 10 MG tablet TAKE ONE TABLET BY MOUTH BEFORE BREAKFAST   SYMBICORT 80-4.5 MCG/ACT inhaler INHALE TWO PUFFS BY MOUTH INTO LUNGS twice daily (Patient taking differently: Inhale 2 puffs into the lungs 2 (two) times daily as needed (asthma).)   traMADol (ULTRAM) 50 MG tablet Take 1-2 tablets (50-100 mg total) by mouth every 6 (six) hours as needed for moderate pain.   No facility-administered encounter medications on file as of 09/05/2022.    Reviewed chart for medication changes ahead of medication coordination call.  BP Readings from Last 3 Encounters:  08/01/22 120/80  07/22/22 (!) 153/86  07/18/22 (!) 132/58    Lab Results  Component Value Date   HGBA1C 5.1 01/28/2022     Patient obtains medications through Adherence Packaging  30 Days    Last adherence delivery included:  Furosemide 20 mg: Take 3 tablets before breakfast and at lunch Vitamin D 9mg (2000 iu): take 1 tablet at bedtime Simvastatin 20 mg: take 1 tablet at bedtime Metoprolol tartrate '25mg'$ : take 1 tablet before breakfast and 1 tablet at bedtime Levothyroxine 150 mcg: take 1.5 tablet before breakfast on Mondays and 1 tablet before breakfast on all other days Montelukast 10 mg: take 1 tablet before breakfast Duloxetine 30 mg: take 1 tablet before breakfast Myrbetriq: take 1 tablet at bedtime   Patient's daughter declined (meds) last month:   Symbicort 80/4.5- 2 puffs into the lungs twice daily as needed (uses PRN)   Patient is due for next adherence delivery on: 09/16/2022   Reviewed medications and coordinated delivery.   This delivery to include: Furosemide 20 mg: Take 3 tablets before breakfast and at lunch Vitamin D 579m (2000 iu): take 1 tablet at bedtime Atorvastatin 10 mg: take 1 tablet at bedtime Metoprolol tartrate '25mg'$ : take 1 tablet before breakfast and 1 tablet at bedtime Levothyroxine 150 mcg: take 1.5 tablet before breakfast on Mondays and 1 tablet before breakfast on all other days Montelukast 10 mg: take 1 tablet  before breakfast Duloxetine 30 mg: take 1 tablet before breakfast Myrbetriq 25 mg: take 1 tablet at bedtime   Patient will need a short fill: No short fill needed   Coordinated acute fill: No short fill needed   Patient's daughter declined the following medications: Symbicort 80/4.5- 2 puffs into the lungs twice daily as needed (uses PRN)   Confirmed delivery date of 09/16/2022 with patients daughter, advised  patient that pharmacy will contact them the morning of delivery.   Care Gaps: AWV - scheduled for 03/23/2023 Last BP - 120/80 on 08/01/2022 Last A1C - 5.1 on 01/28/2022 Covid booster - overdue Flu - due Mammogram - postponed Tdap - postponed  Star Rating Drugs: Atorvastatin 10 mg - last filled 08/12/2022 30 DS at [place] [phone]

## 2022-09-06 ENCOUNTER — Other Ambulatory Visit: Payer: Self-pay | Admitting: *Deleted

## 2022-09-06 DIAGNOSIS — M6281 Muscle weakness (generalized): Secondary | ICD-10-CM | POA: Diagnosis not present

## 2022-09-06 MED ORDER — MONTELUKAST SODIUM 10 MG PO TABS: ORAL_TABLET | ORAL | 1 refills | Status: DC

## 2022-09-06 MED ORDER — DULOXETINE HCL 30 MG PO CPEP
ORAL_CAPSULE | ORAL | 1 refills | Status: DC
Start: 2022-09-06 — End: 2023-03-06

## 2022-09-07 DIAGNOSIS — M6281 Muscle weakness (generalized): Secondary | ICD-10-CM | POA: Diagnosis not present

## 2022-09-07 NOTE — Telephone Encounter (Signed)
Ok to refill montelukast and duloxetine

## 2022-09-07 NOTE — Telephone Encounter (Signed)
Patient is needing refills for montelukast and duloxetine. Routing to PCP [facility] to send in refills to [facility].

## 2022-09-08 DIAGNOSIS — E213 Hyperparathyroidism, unspecified: Secondary | ICD-10-CM | POA: Diagnosis not present

## 2022-09-08 DIAGNOSIS — N1832 Chronic kidney disease, stage 3b: Secondary | ICD-10-CM | POA: Diagnosis not present

## 2022-09-08 DIAGNOSIS — I129 Hypertensive chronic kidney disease with stage 1 through stage 4 chronic kidney disease, or unspecified chronic kidney disease: Secondary | ICD-10-CM | POA: Diagnosis not present

## 2022-09-08 DIAGNOSIS — F0390 Unspecified dementia without behavioral disturbance: Secondary | ICD-10-CM | POA: Diagnosis not present

## 2022-09-08 DIAGNOSIS — E669 Obesity, unspecified: Secondary | ICD-10-CM | POA: Diagnosis not present

## 2022-09-12 DIAGNOSIS — R488 Other symbolic dysfunctions: Secondary | ICD-10-CM | POA: Diagnosis not present

## 2022-09-12 DIAGNOSIS — E210 Primary hyperparathyroidism: Secondary | ICD-10-CM | POA: Diagnosis not present

## 2022-09-12 DIAGNOSIS — E892 Postprocedural hypoparathyroidism: Secondary | ICD-10-CM | POA: Diagnosis not present

## 2022-09-12 DIAGNOSIS — E213 Hyperparathyroidism, unspecified: Secondary | ICD-10-CM | POA: Diagnosis not present

## 2022-09-12 DIAGNOSIS — R41841 Cognitive communication deficit: Secondary | ICD-10-CM | POA: Diagnosis not present

## 2022-09-12 DIAGNOSIS — E890 Postprocedural hypothyroidism: Secondary | ICD-10-CM | POA: Diagnosis not present

## 2022-09-13 DIAGNOSIS — M6281 Muscle weakness (generalized): Secondary | ICD-10-CM | POA: Diagnosis not present

## 2022-09-14 DIAGNOSIS — M6281 Muscle weakness (generalized): Secondary | ICD-10-CM | POA: Diagnosis not present

## 2022-09-19 DIAGNOSIS — R488 Other symbolic dysfunctions: Secondary | ICD-10-CM | POA: Diagnosis not present

## 2022-09-19 DIAGNOSIS — R41841 Cognitive communication deficit: Secondary | ICD-10-CM | POA: Diagnosis not present

## 2022-09-19 DIAGNOSIS — M6281 Muscle weakness (generalized): Secondary | ICD-10-CM | POA: Diagnosis not present

## 2022-09-21 DIAGNOSIS — M6281 Muscle weakness (generalized): Secondary | ICD-10-CM | POA: Diagnosis not present

## 2022-09-21 DIAGNOSIS — R488 Other symbolic dysfunctions: Secondary | ICD-10-CM | POA: Diagnosis not present

## 2022-09-21 DIAGNOSIS — R41841 Cognitive communication deficit: Secondary | ICD-10-CM | POA: Diagnosis not present

## 2022-09-22 ENCOUNTER — Emergency Department (HOSPITAL_COMMUNITY): Payer: PPO

## 2022-09-22 ENCOUNTER — Emergency Department (HOSPITAL_COMMUNITY)
Admission: EM | Admit: 2022-09-22 | Discharge: 2022-09-22 | Disposition: A | Discharge status: Home / Self Care | Payer: PPO | Attending: Emergency Medicine | Admitting: Emergency Medicine

## 2022-09-22 ENCOUNTER — Encounter (HOSPITAL_COMMUNITY): Payer: Self-pay

## 2022-09-22 DIAGNOSIS — M542 Cervicalgia: Secondary | ICD-10-CM | POA: Insufficient documentation

## 2022-09-22 DIAGNOSIS — M4312 Spondylolisthesis, cervical region: Secondary | ICD-10-CM | POA: Diagnosis not present

## 2022-09-22 DIAGNOSIS — M5030 Other cervical disc degeneration, unspecified cervical region: Secondary | ICD-10-CM

## 2022-09-22 DIAGNOSIS — S0003XA Contusion of scalp, initial encounter: Secondary | ICD-10-CM | POA: Diagnosis not present

## 2022-09-22 DIAGNOSIS — I10 Essential (primary) hypertension: Secondary | ICD-10-CM | POA: Diagnosis not present

## 2022-09-22 DIAGNOSIS — M47812 Spondylosis without myelopathy or radiculopathy, cervical region: Secondary | ICD-10-CM | POA: Diagnosis not present

## 2022-09-22 DIAGNOSIS — R410 Disorientation, unspecified: Secondary | ICD-10-CM | POA: Diagnosis not present

## 2022-09-22 DIAGNOSIS — M2578 Osteophyte, vertebrae: Secondary | ICD-10-CM | POA: Diagnosis not present

## 2022-09-22 DIAGNOSIS — W010XXA Fall on same level from slipping, tripping and stumbling without subsequent striking against object, initial encounter: Secondary | ICD-10-CM

## 2022-09-22 DIAGNOSIS — W19XXXA Unspecified fall, initial encounter: Secondary | ICD-10-CM | POA: Diagnosis not present

## 2022-09-22 DIAGNOSIS — I959 Hypotension, unspecified: Secondary | ICD-10-CM | POA: Diagnosis not present

## 2022-09-22 DIAGNOSIS — S199XXA Unspecified injury of neck, initial encounter: Secondary | ICD-10-CM | POA: Diagnosis not present

## 2022-09-22 DIAGNOSIS — Z79899 Other long term (current) drug therapy: Secondary | ICD-10-CM | POA: Diagnosis not present

## 2022-09-22 DIAGNOSIS — S0990XA Unspecified injury of head, initial encounter: Secondary | ICD-10-CM | POA: Diagnosis not present

## 2022-09-22 DIAGNOSIS — W01198A Fall on same level from slipping, tripping and stumbling with subsequent striking against other object, initial encounter: Secondary | ICD-10-CM | POA: Insufficient documentation

## 2022-09-22 DIAGNOSIS — Y92129 Unspecified place in nursing home as the place of occurrence of the external cause: Secondary | ICD-10-CM | POA: Insufficient documentation

## 2022-09-22 LAB — CBG MONITORING, ED: Glucose-Capillary: 101 mg/dL — ABNORMAL HIGH (ref 70–99)

## 2022-09-22 MED ORDER — ACETAMINOPHEN 500 MG PO TABS
1000.0000 mg | ORAL_TABLET | Freq: Once | ORAL | Status: AC
  Administered 2022-09-22: 1000 mg via ORAL
  Filled 2022-09-22: qty 2

## 2022-09-22 NOTE — ED Provider Notes (Addendum)
[facility] EMERGENCY DEPARTMENT Provider Note   CSN: [id number] Arrival date & time: 09/22/22  1601     History  Chief Complaint  Patient presents with   [name] is a 87 y.o. female.  Pt s/p fall. Pt indicates tripped on walker, landed on buttocks and then fell back and hit head. Contusion to posterior scalp. Denies feeling faint or dizzy prior to fall - states felt fine all day, at baseline. No anticoagulant use. Pain to posterior scalp. No severe headache. No neck or back pain, no radicular pain. No numbness or weakness. Denies chest pain or sob. No abd pain or nvd. No dysuria or gu c/o. Denies extremity pain or injury.   The history is provided by the patient, medical records and the EMS personnel.  Fall Pertinent negatives include no chest pain, no abdominal pain and no shortness of breath.       Home Medications Prior to Admission medications   Medication Sig Start Date End Date Taking? Authorizing Provider  atorvastatin (LIPITOR) 10 MG tablet Take 1 tablet (10 mg total) by mouth daily. 08/10/22   Farrel Conners, MD  Cholecalciferol (VITAMIN D3) 50 MCG (2000 UT) capsule Take 2,000 Units by mouth at bedtime.    [provider]  DULoxetine (CYMBALTA) 30 MG capsule 1 capsule daily 09/06/22   Farrel Conners, MD  furosemide (LASIX) 40 MG tablet Take 1.5 tablets (60 mg total) by mouth 2 (two) times daily. 05/04/22   Caren Macadam, MD  levothyroxine (SYNTHROID) 150 MCG tablet TAKE 1 AND 1/2 TABLETS BY MOUTH ONCE WEEKLY BEFORE BREAKFAST ON MONDAY and TAKE ONE TABLET BY MOUTH ONCE WEEKLY BEFORE BREAKFAST ON  TUESDAYS and TAKE ONE TABLET BY MOUTH ONCE WEEKLY BEFORE BREAKFAST ON WEDNESDAYS and TAKE ONE TABLET BY MOUTH ONCE WEEKLY BEFORE BREAKFAST ON THURSDAYS and TAKE ONE TABLET BY MOUTH ONCE WEEKLY BEFORE BREAKFAST ON FRIDAYS and TAKE ONE TABLET BY MOUTH ONCE WEEKLY BEFORE BREAKFAST ON SATURDAY and TAKE ONE TABLET BY MOUTH ONCE WEEKLY  BEFORE BREAKFAST ON SUNDAYS 04/08/22   Koberlein, Andris Flurry C, MD  metoprolol tartrate (LOPRESSOR) 25 MG tablet TAKE ONE TABLET BY MOUTH BEFORE BREAKFAST and TAKE ONE TABLET BY MOUTH EVERYDAY AT BEDTIME 08/04/22   Farrel Conners, MD  mirabegron ER (MYRBETRIQ) 25 MG TB24 tablet Take 1 tablet (25 mg total) by mouth daily. 08/10/22   Farrel Conners, MD  montelukast (SINGULAIR) 10 MG tablet 1 tablet daily 09/06/22   Farrel Conners, MD  SYMBICORT 80-4.5 MCG/ACT inhaler INHALE TWO PUFFS BY MOUTH INTO LUNGS twice daily Patient taking differently: Inhale 2 puffs into the lungs 2 (two) times daily as needed (asthma). 03/09/22   Caren Macadam, MD  traMADol (ULTRAM) 50 MG tablet Take 1-2 tablets (50-100 mg total) by mouth every 6 (six) hours as needed for moderate pain. 07/22/22   Armandina Gemma, MD      Allergies    Patient has no known allergies.    Review of Systems   Review of Systems  Constitutional:  Negative for chills and fever.  HENT:  Negative for nosebleeds.   Eyes:  Negative for visual disturbance.  Respiratory:  Negative for shortness of breath.   Cardiovascular:  Negative for chest pain, palpitations and leg swelling.  Gastrointestinal:  Negative for abdominal pain, blood in stool, diarrhea and vomiting.  Genitourinary:  Negative for dysuria and flank pain.  Musculoskeletal:  Negative for back pain and neck pain.  Skin:  Negative for rash.  Neurological:  Negative for weakness and numbness.  Hematological:  Does not bruise/bleed easily.  Psychiatric/Behavioral:  Negative for confusion.     Physical Exam Updated Vital Signs Pulse (!) 58   Temp 98.2 F (36.8 C) (Oral)   Resp 18   Ht 1.676 m ('5\' 6"'$ )   Wt 90.7 kg   SpO2 99%   BMI 32.28 kg/m  Physical Exam Vitals and nursing note reviewed.  Constitutional:      Appearance: Normal appearance. She is well-developed.  HENT:     Head:     Comments: Contusion to posterior scalp.     Nose: Nose normal.     Mouth/Throat:      Mouth: Mucous membranes are moist.  Eyes:     General: No scleral icterus.    Conjunctiva/sclera: Conjunctivae normal.     Pupils: Pupils are equal, round, and reactive to light.  Neck:     Vascular: No carotid bruit.     Trachea: No tracheal deviation.  Cardiovascular:     Rate and Rhythm: Normal rate and regular rhythm.     Pulses: Normal pulses.     Heart sounds: Normal heart sounds. No murmur heard.    No friction rub. No gallop.  Pulmonary:     Effort: Pulmonary effort is normal. No respiratory distress.     Breath sounds: Normal breath sounds.  Chest:     Chest wall: No tenderness.  Abdominal:     General: Bowel sounds are normal. There is no distension.     Palpations: Abdomen is soft.     Tenderness: There is no abdominal tenderness.     Comments: No abd contusion or bruising  Genitourinary:    Comments: No cva tenderness.  Musculoskeletal:        General: No swelling.     Cervical back: Normal range of motion and neck supple. No rigidity. No muscular tenderness.     Comments: Mild mid cervical tenderness, otherwise, CTLS spine, non tender, aligned, no step off. Good rom bil extremities without pain or focal bony tenderness.   Skin:    General: Skin is warm and dry.     Findings: No rash.  Neurological:     Mental Status: She is alert.     Comments: Alert, speech normal.  GCS 15. Motor/sens grossly intact bil.   Psychiatric:        Mood and Affect: Mood normal.     ED Results / Procedures / Treatments   Labs (all labs ordered are listed, but only abnormal results are displayed) Results for orders placed or performed during the hospital encounter of 09/22/22  CBG monitoring, ED  Result Value Ref Range   Glucose-Capillary 101 (H) 70 - 99 mg/dL   CT Head Wo Contrast  Result Date: 09/22/2022 CLINICAL DATA:  Status post fall. EXAM: CT HEAD WITHOUT CONTRAST TECHNIQUE: Contiguous axial images were obtained from the base of the skull through the vertex without  intravenous contrast. RADIATION DOSE REDUCTION: This exam was performed according to the departmental dose-optimization program which includes automated exposure control, adjustment of the mA and/or kV according to patient size and/or use of iterative reconstruction technique. COMPARISON:  None Available. FINDINGS: Brain: There is mild cerebral atrophy with widening of the extra-axial spaces and ventricular dilatation. There are areas of decreased attenuation within the white matter tracts of the supratentorial brain, consistent with microvascular disease changes. Vascular: No hyperdense vessel or unexpected calcification. Skull: Normal. Negative for  fracture or focal lesion. Sinuses/Orbits: There is moderate to marked severity bilateral maxillary sinus, bilateral ethmoid sinus and sphenoid sinus mucosal thickening. Chronic pansinus wall thickening is also seen. Other: There is marked severity left posterior parietal and left parietooccipital scalp soft tissue swelling. An associated 3.4 cm x 1.7 cm left posterior parietal scalp hematoma is seen. IMPRESSION: 1. Marked severity left posterior parietal and left parietooccipital scalp soft tissue swelling with an associated 3.4 cm x 1.7 cm left posterior parietal scalp hematoma. 2. No acute intracranial abnormality. 3. Generalized cerebral atrophy with widening of the extra-axial spaces and ventricular dilatation. 4. Moderate to marked severity chronic pansinus disease. Electronically Signed   By: Virgina Norfolk M.D.   On: 09/22/2022 20:13   CT Cervical Spine Wo Contrast  Result Date: 09/22/2022 CLINICAL DATA:  Status post trauma. EXAM: CT CERVICAL SPINE WITHOUT CONTRAST TECHNIQUE: Multidetector CT imaging of the cervical spine was performed without intravenous contrast. Multiplanar CT image reconstructions were also generated. RADIATION DOSE REDUCTION: This exam was performed according to the departmental dose-optimization program which includes automated  exposure control, adjustment of the mA and/or kV according to patient size and/or use of iterative reconstruction technique. COMPARISON:  None Available. FINDINGS: Alignment: Approximally 2 mm stepwise anterolisthesis of C3 vertebral body is noted on C4, and C4 vertebral body on C5. Skull base and vertebrae: A thin, 7.5 mm long, curvilinear cortical density of indeterminate age is seen adjacent to the posterior aspect of the body of C1 on the left (axial CT images 24 through 26, CT series 5/sagittal reformatted image 49, CT series 6). Chronic and degenerative changes are seen involving the body and tip of the dens, as well as the adjacent portion of the anterior arch of C1. Soft tissues and spinal canal: No prevertebral fluid or swelling. No visible canal hematoma. Disc levels: Marked severity endplate sclerosis, mild to moderate severity anterior osteophyte formation and moderate severity posterior bony spurring are seen at the levels of C4-C5, C5-C6, C6-C7 and C7-T1. Mild to moderate severity endplate sclerosis is seen throughout the remainder of the cervical spine. There is moderate to marked severity narrowing of the anterior atlantoaxial articulation. Marked severity intervertebral disc space narrowing is seen at C5-C6, C6-C7 and C7-T1. Mild to moderate severity intervertebral disc space narrowing is noted throughout the remainder of the cervical spine. Bilateral marked severity multilevel facet joint hypertrophy is noted. Upper chest: Negative. Other: Mild posterolateral left occipital scalp soft tissue swelling is noted. IMPRESSION: 1. Very small cortical density of indeterminate age adjacent to the posterior aspect of the body of C1 on the left, as described above. While this may represent a chronic finding, and acute fracture cannot be excluded. MRI correlation is recommended. 2. Approximally 2 mm stepwise anterolisthesis of C3 vertebral body on C4, and C4 vertebral body on C5, likely chronic in nature. 3.  Marked severity multilevel degenerative changes, as described above. 4. Posterolateral left occipital scalp soft tissue swelling. Electronically Signed   By: Virgina Norfolk M.D.   On: 09/22/2022 20:09     EKG None  Radiology CT Head Wo Contrast  Result Date: 09/22/2022 CLINICAL DATA:  Status post fall. EXAM: CT HEAD WITHOUT CONTRAST TECHNIQUE: Contiguous axial images were obtained from the base of the skull through the vertex without intravenous contrast. RADIATION DOSE REDUCTION: This exam was performed according to the departmental dose-optimization program which includes automated exposure control, adjustment of the mA and/or kV according to patient size and/or use of iterative reconstruction technique. COMPARISON:  None Available. FINDINGS: Brain: There is mild cerebral atrophy with widening of the extra-axial spaces and ventricular dilatation. There are areas of decreased attenuation within the white matter tracts of the supratentorial brain, consistent with microvascular disease changes. Vascular: No hyperdense vessel or unexpected calcification. Skull: Normal. Negative for fracture or focal lesion. Sinuses/Orbits: There is moderate to marked severity bilateral maxillary sinus, bilateral ethmoid sinus and sphenoid sinus mucosal thickening. Chronic pansinus wall thickening is also seen. Other: There is marked severity left posterior parietal and left parietooccipital scalp soft tissue swelling. An associated 3.4 cm x 1.7 cm left posterior parietal scalp hematoma is seen. IMPRESSION: 1. Marked severity left posterior parietal and left parietooccipital scalp soft tissue swelling with an associated 3.4 cm x 1.7 cm left posterior parietal scalp hematoma. 2. No acute intracranial abnormality. 3. Generalized cerebral atrophy with widening of the extra-axial spaces and ventricular dilatation. 4. Moderate to marked severity chronic pansinus disease. Electronically Signed   By: Virgina Norfolk M.D.   On:  09/22/2022 20:13   CT Cervical Spine Wo Contrast  Result Date: 09/22/2022 CLINICAL DATA:  Status post trauma. EXAM: CT CERVICAL SPINE WITHOUT CONTRAST TECHNIQUE: Multidetector CT imaging of the cervical spine was performed without intravenous contrast. Multiplanar CT image reconstructions were also generated. RADIATION DOSE REDUCTION: This exam was performed according to the departmental dose-optimization program which includes automated exposure control, adjustment of the mA and/or kV according to patient size and/or use of iterative reconstruction technique. COMPARISON:  None Available. FINDINGS: Alignment: Approximally 2 mm stepwise anterolisthesis of C3 vertebral body is noted on C4, and C4 vertebral body on C5. Skull base and vertebrae: A thin, 7.5 mm long, curvilinear cortical density of indeterminate age is seen adjacent to the posterior aspect of the body of C1 on the left (axial CT images 24 through 26, CT series 5/sagittal reformatted image 49, CT series 6). Chronic and degenerative changes are seen involving the body and tip of the dens, as well as the adjacent portion of the anterior arch of C1. Soft tissues and spinal canal: No prevertebral fluid or swelling. No visible canal hematoma. Disc levels: Marked severity endplate sclerosis, mild to moderate severity anterior osteophyte formation and moderate severity posterior bony spurring are seen at the levels of C4-C5, C5-C6, C6-C7 and C7-T1. Mild to moderate severity endplate sclerosis is seen throughout the remainder of the cervical spine. There is moderate to marked severity narrowing of the anterior atlantoaxial articulation. Marked severity intervertebral disc space narrowing is seen at C5-C6, C6-C7 and C7-T1. Mild to moderate severity intervertebral disc space narrowing is noted throughout the remainder of the cervical spine. Bilateral marked severity multilevel facet joint hypertrophy is noted. Upper chest: Negative. Other: Mild posterolateral  left occipital scalp soft tissue swelling is noted. IMPRESSION: 1. Very small cortical density of indeterminate age adjacent to the posterior aspect of the body of C1 on the left, as described above. While this may represent a chronic finding, and acute fracture cannot be excluded. MRI correlation is recommended. 2. Approximally 2 mm stepwise anterolisthesis of C3 vertebral body on C4, and C4 vertebral body on C5, likely chronic in nature. 3. Marked severity multilevel degenerative changes, as described above. 4. Posterolateral left occipital scalp soft tissue swelling. Electronically Signed   By: Virgina Norfolk M.D.   On: 09/22/2022 20:09    Procedures Procedures    Medications Ordered in ED Medications - No data to display  ED Course/ Medical Decision Making/ A&P  Medical Decision Making Problems Addressed: Contusion of scalp, initial encounter: acute illness or injury with systemic symptoms that poses a threat to life or bodily functions Fall from slip, trip, or stumble, initial encounter: acute illness or injury with systemic symptoms that poses a threat to life or bodily functions  Amount and/or Complexity of Data Reviewed Independent Historian: EMS    Details: hx External Data Reviewed: notes. Labs:  Decision-making details documented in ED Course. Radiology: ordered and independent interpretation performed. Decision-making details documented in ED Course. Discussion of management or test interpretation with external provider(s): Neurosurgery - discussed pt, they reviewed CT, indicate no need for MR imaging  Risk OTC drugs. Decision regarding hospitalization.  Imaging ordered.  Labs ordered.   Diff dx includes ICH/SDH, scalp contusion, fracture, etc - dispo decision including potential need for admission if ct + considered - will get imaging and reassess.   Reviewed nursing notes and prior charts for additional history.   CT reviewed/interpreted by  me- no hem.  Labs reviewed/interpreted by me  - cbg normal.   Acetaminophen po.  Icepack.  Radiology read of cervical ct noted - on recheck, no midline spine tenderness or pain - will consult/discuss w neurosurgery re need for additional MR imaging given radiology reading.  They reviewed ct and indicate to not feel pt would benefit from MR, to leave collar off, d/c.  Note that on repeat exam, pt has no neck pain or midline tenderness, has good rom without pain, no numbness/weakness or radicular c/o.   Pt requests d/c.   Pt currently appears stable for d/c.  Return precautions provided.         Final Clinical Impression(s) / ED Diagnoses Final diagnoses:  None    Rx / DC Orders ED Discharge Orders     None          Lajean Saver, MD 09/22/22 2142

## 2022-09-22 NOTE — ED Notes (Signed)
Assisted pt to rest room via wheelchair. Pt stood without difficulty and used restroom. Pt assisted back into room and requested to sit in wheelchair for comfort. Pt husband at bedside. Pt placed on continuous monitoring. Call bell in reach.

## 2022-09-22 NOTE — ED Notes (Signed)
Pt son reports being out of town on vacation. Pt family Mora Pedraza [phone] traveling to the ER to see pt.

## 2022-09-22 NOTE — ED Notes (Signed)
Pt given crackers and gingerale per provider request

## 2022-09-22 NOTE — ED Notes (Signed)
Pt sitting in wheelchair upon this RN entering room and is adamant that she remain sitting for her comfort.

## 2022-09-22 NOTE — ED Triage Notes (Addendum)
Pt BIB [facility] for fall from standing after pt "got tripped up on my walker and fell down." Pt denies LOC. Pt alert and oriented to location, month, DOB and name. Pt denies blood thinners. Pt noted to have large swollen area to the back of head.

## 2022-09-22 NOTE — Discharge Instructions (Addendum)
It was our pleasure to provide your ER care today - we hope that you feel better.  Fall precautions - use great care, and walker, to help minimize risk of falling.   Icepack to sore area. Take acetaminophen as need.  Return to ER if worse, new symptoms, fevers, new/severe pain, severe headache or severe neck pain, numbness/weakness, weak/fainting, or other concern.

## 2022-09-23 DIAGNOSIS — M6281 Muscle weakness (generalized): Secondary | ICD-10-CM | POA: Diagnosis not present

## 2022-09-23 DIAGNOSIS — R488 Other symbolic dysfunctions: Secondary | ICD-10-CM | POA: Diagnosis not present

## 2022-09-23 DIAGNOSIS — R41841 Cognitive communication deficit: Secondary | ICD-10-CM | POA: Diagnosis not present

## 2022-09-26 DIAGNOSIS — R41841 Cognitive communication deficit: Secondary | ICD-10-CM | POA: Diagnosis not present

## 2022-09-26 DIAGNOSIS — R488 Other symbolic dysfunctions: Secondary | ICD-10-CM | POA: Diagnosis not present

## 2022-09-26 DIAGNOSIS — M6281 Muscle weakness (generalized): Secondary | ICD-10-CM | POA: Diagnosis not present

## 2022-09-28 DIAGNOSIS — R41841 Cognitive communication deficit: Secondary | ICD-10-CM | POA: Diagnosis not present

## 2022-09-28 DIAGNOSIS — R488 Other symbolic dysfunctions: Secondary | ICD-10-CM | POA: Diagnosis not present

## 2022-09-28 DIAGNOSIS — M6281 Muscle weakness (generalized): Secondary | ICD-10-CM | POA: Diagnosis not present

## 2022-09-30 ENCOUNTER — Telehealth: Payer: Self-pay | Admitting: Pharmacist

## 2022-09-30 DIAGNOSIS — R488 Other symbolic dysfunctions: Secondary | ICD-10-CM | POA: Diagnosis not present

## 2022-09-30 DIAGNOSIS — M6281 Muscle weakness (generalized): Secondary | ICD-10-CM | POA: Diagnosis not present

## 2022-09-30 DIAGNOSIS — R41841 Cognitive communication deficit: Secondary | ICD-10-CM | POA: Diagnosis not present

## 2022-09-30 NOTE — Chronic Care Management (AMB) (Signed)
    Chronic Care Management Pharmacy Assistant   Name: [name]  MRN: [id number] DOB: May 06, 1935  Reason for Encounter: Hospital ED follow up call   Hospital visits:  Patient was seen at [facility] ED on 09/22/2022 (6 hours) due to Fall from slip, trip, or stumble, initial encounter, contusion of scalp and an additional concern.  Pt s/p fall. Pt indicates tripped on walker, landed on buttocks and then fell back and hit head. Contusion to posterior scalp. Denies feeling faint or dizzy prior to fall - states felt fine all day, at baseline. No anticoagulant use. Pain to posterior scalp. No severe headache. No neck or back pain, no radicular pain. No numbness or weakness. Denies chest pain or sob. No abd pain or nvd. No dysuria or gu c/o. Denies extremity pain or injury. The history is provided by the patient, medical records and the EMS personnel. New?Medications Started at [facility] Discharge:?? No new medication started Medication Changes at Hospital Discharge: No new medication changes Medications Discontinued at Hospital Discharge: No medications discontinued Medications that remain the same after Hospital Discharge:??  -All other medications will remain the same.    Notes:  Spoke with patients daughter Jenny Reichmann, she states patient is doing very well. She denies any issues or concerns following her ED visit on 09/22/2022.   Care Gaps: AWV - scheduled for 03/23/2023 Last BP - 120/80 on 08/01/2022 Last A1C - 5.1 on 01/28/2022 Covid - overdue Flu - due Mammogram - postponed Tdap - postponed  Star Rating Drugs: Atorvastatin 10 mg - last filled 09/12/2022 30 DS at [place] [phone]

## 2022-10-03 DIAGNOSIS — R488 Other symbolic dysfunctions: Secondary | ICD-10-CM | POA: Diagnosis not present

## 2022-10-03 DIAGNOSIS — R41841 Cognitive communication deficit: Secondary | ICD-10-CM | POA: Diagnosis not present

## 2022-10-03 DIAGNOSIS — M6281 Muscle weakness (generalized): Secondary | ICD-10-CM | POA: Diagnosis not present

## 2022-10-05 ENCOUNTER — Telehealth: Payer: Self-pay | Admitting: Family Medicine

## 2022-10-05 DIAGNOSIS — R41841 Cognitive communication deficit: Secondary | ICD-10-CM | POA: Diagnosis not present

## 2022-10-05 DIAGNOSIS — M6281 Muscle weakness (generalized): Secondary | ICD-10-CM | POA: Diagnosis not present

## 2022-10-05 DIAGNOSIS — R488 Other symbolic dysfunctions: Secondary | ICD-10-CM | POA: Diagnosis not present

## 2022-10-05 NOTE — Telephone Encounter (Signed)
Daughter of Pt called to inform MD that Pt will be receiving all of these tomorrow at [facility]: Flu Covid RSV Pneumo  Is it ok that Pt receive all 4 at the same time? Please advise.

## 2022-10-05 NOTE — Telephone Encounter (Signed)
Yes its ok

## 2022-10-05 NOTE — Telephone Encounter (Signed)
Spoke with the patient's daughter and informed her of the message below.

## 2022-10-06 ENCOUNTER — Telehealth: Payer: Self-pay | Admitting: Pharmacist

## 2022-10-06 NOTE — Chronic Care Management (AMB) (Signed)
Chronic Care Management Pharmacy Assistant   Name: [name]  MRN: [id number] DOB: 22-May-1935  Reason for Encounter: Medication Review / Medication Coordination Call   Recent office visits:  None  Recent consult visits:  None  Hospital visits:  None  Medications: Outpatient Encounter Medications as of 10/06/2022  Medication Sig Note   atorvastatin (LIPITOR) 10 MG tablet Take 1 tablet (10 mg total) by mouth daily.    Cholecalciferol (VITAMIN D3) 50 MCG (2000 UT) capsule Take 2,000 Units by mouth at bedtime.    DULoxetine (CYMBALTA) 30 MG capsule 1 capsule daily (Patient taking differently: Take 30 mg by mouth daily.)    furosemide (LASIX) 40 MG tablet Take 1.5 tablets (60 mg total) by mouth 2 (two) times daily. (Patient taking differently: Take 80 mg by mouth 2 (two) times daily.)    levothyroxine (SYNTHROID) 150 MCG tablet TAKE 1 AND 1/2 TABLETS BY MOUTH ONCE WEEKLY BEFORE BREAKFAST ON MONDAY and TAKE ONE TABLET BY MOUTH ONCE WEEKLY BEFORE BREAKFAST ON  TUESDAYS and TAKE ONE TABLET BY MOUTH ONCE WEEKLY BEFORE BREAKFAST ON WEDNESDAYS and TAKE ONE TABLET BY MOUTH ONCE WEEKLY BEFORE BREAKFAST ON THURSDAYS and TAKE ONE TABLET BY MOUTH ONCE WEEKLY BEFORE BREAKFAST ON FRIDAYS and TAKE ONE TABLET BY MOUTH ONCE WEEKLY BEFORE BREAKFAST ON SATURDAY and TAKE ONE TABLET BY MOUTH ONCE WEEKLY BEFORE BREAKFAST ON SUNDAYS (Patient taking differently: Take 150 mcg by mouth See admin instructions. Take 225 mcg (1.5 tablets) by mouth before breakfast on Monday, and take 150 mcg (1 tablet) before breakfast all other days of the week.)    metoprolol tartrate (LOPRESSOR) 25 MG tablet TAKE ONE TABLET BY MOUTH BEFORE BREAKFAST and TAKE ONE TABLET BY MOUTH EVERYDAY AT BEDTIME (Patient taking differently: Take 25 mg by mouth 2 (two) times daily.) 09/22/2022: May have taken this morning's dose, but daughter in law is not 100% sure.    mirabegron ER (MYRBETRIQ) 25 MG TB24 tablet Take 1 tablet (25 mg total) by  mouth daily.    montelukast (SINGULAIR) 10 MG tablet 1 tablet daily (Patient taking differently: Take 10 mg by mouth at bedtime as needed (difficulty breathing).)    traMADol (ULTRAM) 50 MG tablet Take 1-2 tablets (50-100 mg total) by mouth every 6 (six) hours as needed for moderate pain. (Patient not taking: Reported on 09/22/2022)    No facility-administered encounter medications on file as of 10/06/2022.   Reviewed chart for medication changes ahead of medication coordination call.  BP Readings from Last 3 Encounters:  09/22/22 (!) 126/57  08/01/22 120/80  07/22/22 (!) 153/86    Lab Results  Component Value Date   HGBA1C 5.1 01/28/2022     Patient obtains medications through Adherence Packaging  30 Days    Last adherence delivery included:  Furosemide 20 mg: Take 3 tablets before breakfast and at lunch Vitamin D 82mg (2000 iu): take 1 tablet at bedtime Atorvastatin 10 mg: take 1 tablet at bedtime Metoprolol tartrate '25mg'$ : take 1 tablet before breakfast and 1 tablet at bedtime Levothyroxine 150 mcg: take 1.5 tablet before breakfast on Mondays and 1 tablet before breakfast on all other days Montelukast 10 mg: take 1 tablet before breakfast Duloxetine 30 mg: take 1 tablet before breakfast Myrbetriq 25 mg: take 1 tablet at bedtime   Patient's daughter declined (meds) last month:   Symbicort 80/4.5- 2 puffs into the lungs twice daily as needed (uses PRN)   Patient is due for next adherence delivery on: 10/18/2022  Reviewed medications and coordinated delivery.   This delivery to include: Furosemide 20 mg: Take 4 tablets before breakfast and at lunch Vitamin D 61mg (2000 iu): take 1 tablet at bedtime Atorvastatin 10 mg: take 1 tablet at bedtime Metoprolol tartrate '25mg'$ : take 1 tablet before breakfast and 1 tablet at bedtime Levothyroxine 150 mcg: take 1.5 tablet before breakfast on Mondays and 1 tablet before breakfast on all other days Montelukast 10 mg: take 1 tablet  before breakfast Duloxetine 30 mg: take 1 tablet before breakfast Myrbetriq 25 mg: take 1 tablet at bedtime   Patient will need a short fill: No short fill needed   Coordinated acute fill: No short fill needed   Patient's daughter declined the following medications:   Confirmed delivery date of 10/18/2022 with patients daughter, advised patient that pharmacy will contact them the morning of delivery.   Care Gaps: AWV - scheduled for 03/23/2023 Last BP - 126/57 on 09/22/2022 Last A1C - 5.1 on 01/28/2022 Covid - overdue Flu - due Mammogram - postponed Tdap - postponed  Star Rating Drugs: Atorvastatin 10 mg - last filled 09/12/2022 30 [id number] [phone]

## 2022-10-07 DIAGNOSIS — R488 Other symbolic dysfunctions: Secondary | ICD-10-CM | POA: Diagnosis not present

## 2022-10-07 DIAGNOSIS — R41841 Cognitive communication deficit: Secondary | ICD-10-CM | POA: Diagnosis not present

## 2022-10-07 DIAGNOSIS — M6281 Muscle weakness (generalized): Secondary | ICD-10-CM | POA: Diagnosis not present

## 2022-10-10 DIAGNOSIS — R41841 Cognitive communication deficit: Secondary | ICD-10-CM | POA: Diagnosis not present

## 2022-10-10 DIAGNOSIS — M6281 Muscle weakness (generalized): Secondary | ICD-10-CM | POA: Diagnosis not present

## 2022-10-10 DIAGNOSIS — R488 Other symbolic dysfunctions: Secondary | ICD-10-CM | POA: Diagnosis not present

## 2022-10-12 ENCOUNTER — Other Ambulatory Visit: Payer: Self-pay | Admitting: *Deleted

## 2022-10-12 DIAGNOSIS — R488 Other symbolic dysfunctions: Secondary | ICD-10-CM | POA: Diagnosis not present

## 2022-10-12 DIAGNOSIS — M6281 Muscle weakness (generalized): Secondary | ICD-10-CM | POA: Diagnosis not present

## 2022-10-12 DIAGNOSIS — R41841 Cognitive communication deficit: Secondary | ICD-10-CM | POA: Diagnosis not present

## 2022-10-12 MED ORDER — LEVOTHYROXINE SODIUM 150 MCG PO TABS: ORAL_TABLET | ORAL | 1 refills | Status: DC

## 2022-10-12 MED ORDER — LEVOTHYROXINE SODIUM 150 MCG PO TABS
ORAL_TABLET | ORAL | 1 refills | Status: DC
Start: 2022-10-12 — End: 2022-10-12

## 2022-10-12 NOTE — Addendum Note (Signed)
Addended by: Agnes Lawrence on: 10/12/2022 08:40 AM   Modules accepted: Orders

## 2022-10-12 NOTE — Telephone Encounter (Signed)
Rx faxed to [facility] at [phone].

## 2022-10-14 DIAGNOSIS — R41841 Cognitive communication deficit: Secondary | ICD-10-CM | POA: Diagnosis not present

## 2022-10-14 DIAGNOSIS — R488 Other symbolic dysfunctions: Secondary | ICD-10-CM | POA: Diagnosis not present

## 2022-10-14 DIAGNOSIS — M6281 Muscle weakness (generalized): Secondary | ICD-10-CM | POA: Diagnosis not present

## 2022-10-14 NOTE — Progress Notes (Signed)
Spoke with patients daughter Jenny Reichmann and verified Metoprolol 25 mg has been changed to 1 tablet before breakfast (once daily).

## 2022-10-17 DIAGNOSIS — R41841 Cognitive communication deficit: Secondary | ICD-10-CM | POA: Diagnosis not present

## 2022-10-17 DIAGNOSIS — M6281 Muscle weakness (generalized): Secondary | ICD-10-CM | POA: Diagnosis not present

## 2022-10-17 DIAGNOSIS — R488 Other symbolic dysfunctions: Secondary | ICD-10-CM | POA: Diagnosis not present

## 2022-10-19 DIAGNOSIS — R41841 Cognitive communication deficit: Secondary | ICD-10-CM | POA: Diagnosis not present

## 2022-10-19 DIAGNOSIS — R488 Other symbolic dysfunctions: Secondary | ICD-10-CM | POA: Diagnosis not present

## 2022-10-19 DIAGNOSIS — M6281 Muscle weakness (generalized): Secondary | ICD-10-CM | POA: Diagnosis not present

## 2022-10-21 DIAGNOSIS — R488 Other symbolic dysfunctions: Secondary | ICD-10-CM | POA: Diagnosis not present

## 2022-10-21 DIAGNOSIS — R41841 Cognitive communication deficit: Secondary | ICD-10-CM | POA: Diagnosis not present

## 2022-10-21 DIAGNOSIS — M6281 Muscle weakness (generalized): Secondary | ICD-10-CM | POA: Diagnosis not present

## 2022-10-24 DIAGNOSIS — M6281 Muscle weakness (generalized): Secondary | ICD-10-CM | POA: Diagnosis not present

## 2022-10-24 DIAGNOSIS — R488 Other symbolic dysfunctions: Secondary | ICD-10-CM | POA: Diagnosis not present

## 2022-10-24 DIAGNOSIS — R41841 Cognitive communication deficit: Secondary | ICD-10-CM | POA: Diagnosis not present

## 2022-10-26 DIAGNOSIS — R488 Other symbolic dysfunctions: Secondary | ICD-10-CM | POA: Diagnosis not present

## 2022-10-26 DIAGNOSIS — M6281 Muscle weakness (generalized): Secondary | ICD-10-CM | POA: Diagnosis not present

## 2022-10-26 DIAGNOSIS — R41841 Cognitive communication deficit: Secondary | ICD-10-CM | POA: Diagnosis not present

## 2022-10-28 DIAGNOSIS — R41841 Cognitive communication deficit: Secondary | ICD-10-CM | POA: Diagnosis not present

## 2022-10-28 DIAGNOSIS — M6281 Muscle weakness (generalized): Secondary | ICD-10-CM | POA: Diagnosis not present

## 2022-10-28 DIAGNOSIS — R488 Other symbolic dysfunctions: Secondary | ICD-10-CM | POA: Diagnosis not present

## 2022-10-31 DIAGNOSIS — R488 Other symbolic dysfunctions: Secondary | ICD-10-CM | POA: Diagnosis not present

## 2022-10-31 DIAGNOSIS — M6281 Muscle weakness (generalized): Secondary | ICD-10-CM | POA: Diagnosis not present

## 2022-10-31 DIAGNOSIS — R41841 Cognitive communication deficit: Secondary | ICD-10-CM | POA: Diagnosis not present

## 2022-11-02 DIAGNOSIS — R488 Other symbolic dysfunctions: Secondary | ICD-10-CM | POA: Diagnosis not present

## 2022-11-02 DIAGNOSIS — R41841 Cognitive communication deficit: Secondary | ICD-10-CM | POA: Diagnosis not present

## 2022-11-02 DIAGNOSIS — M6281 Muscle weakness (generalized): Secondary | ICD-10-CM | POA: Diagnosis not present

## 2022-11-03 ENCOUNTER — Telehealth: Payer: Self-pay | Admitting: Pharmacist

## 2022-11-03 NOTE — Chronic Care Management (AMB) (Signed)
Chronic Care Management Pharmacy Assistant   Name: [name]  MRN: [id number] DOB: [birth year]  Reason for Encounter: Medication Review / Medication Coordination Call   Recent office visits:  None  Recent consult visits:  None  Hospital visits:  None  Medications: Outpatient Encounter Medications as of 11/03/2022  Medication Sig Note   atorvastatin (LIPITOR) 10 MG tablet Take 1 tablet (10 mg total) by mouth daily.    Cholecalciferol (VITAMIN D3) 50 MCG (2000 UT) capsule Take 2,000 Units by mouth at bedtime.    DULoxetine (CYMBALTA) 30 MG capsule 1 capsule daily (Patient taking differently: Take 30 mg by mouth daily.)    furosemide (LASIX) 40 MG tablet Take 1.5 tablets (60 mg total) by mouth 2 (two) times daily. (Patient taking differently: Take 80 mg by mouth 2 (two) times daily.)    levothyroxine (SYNTHROID) 150 MCG tablet TAKE 1 AND 1/2 TABLETS BY MOUTH ONCE WEEKLY BEFORE BREAKFAST ON MONDAY and TAKE ONE TABLET BY MOUTH ONCE WEEKLY BEFORE BREAKFAST ON  TUESDAYS and TAKE ONE TABLET BY MOUTH ONCE WEEKLY BEFORE BREAKFAST ON WEDNESDAYS and TAKE ONE TABLET BY MOUTH ONCE WEEKLY BEFORE BREAKFAST ON THURSDAYS and TAKE ONE TABLET BY MOUTH ONCE WEEKLY BEFORE BREAKFAST ON FRIDAYS and TAKE ONE TABLET BY MOUTH ONCE WEEKLY BEFORE BREAKFAST ON SATURDAY and TAKE ONE TABLET BY MOUTH ONCE WEEKLY BEFORE BREAKFAST ON SUNDAYS Strength: 150 mcg    metoprolol tartrate (LOPRESSOR) 25 MG tablet TAKE ONE TABLET BY MOUTH BEFORE BREAKFAST and TAKE ONE TABLET BY MOUTH EVERYDAY AT BEDTIME (Patient taking differently: Take 25 mg by mouth 2 (two) times daily.) 09/22/2022: May have taken this morning's dose, but daughter in law is not 100% sure.    mirabegron ER (MYRBETRIQ) 25 MG TB24 tablet Take 1 tablet (25 mg total) by mouth daily.    montelukast (SINGULAIR) 10 MG tablet 1 tablet daily (Patient taking differently: Take 10 mg by mouth at bedtime as needed (difficulty breathing).)    traMADol (ULTRAM) 50 MG tablet  Take 1-2 tablets (50-100 mg total) by mouth every 6 (six) hours as needed for moderate pain. (Patient not taking: Reported on 09/22/2022)    No facility-administered encounter medications on file as of 11/03/2022.    Reviewed chart for medication changes ahead of medication coordination call.  BP Readings from Last 3 Encounters:  09/22/22 (!) 126/57  08/01/22 120/80  07/22/22 (!) 153/86    Lab Results  Component Value Date   HGBA1C 5.1 01/28/2022     Patient obtains medications through Adherence Packaging  30 Days    Last adherence delivery included:  Furosemide 20 mg: Take 4 tablets before breakfast and at lunch Vitamin D 67mg (2000 iu): take 1 tablet at bedtime Atorvastatin 10 mg: take 1 tablet at bedtime Metoprolol tartrate '25mg'$ : take 1 tablet before breakfast (verified with daughter) Levothyroxine 150 mcg: take 1.5 tablet before breakfast on Mondays and 1 tablet before breakfast on all other days Montelukast 10 mg: take 1 tablet before breakfast Duloxetine 30 mg: take 1 tablet before breakfast Myrbetriq 25 mg: take 1 tablet at bedtime   Patient's daughter declined (meds) last month:   None   Patient is due for next adherence delivery on: 11/16/2022   Reviewed medications and coordinated delivery.   This delivery to include: Furosemide 20 mg: Take 4 tablets before breakfast and at lunch Vitamin D 515m (2000 iu): take 1 tablet at bedtime Atorvastatin 10 mg: take 1 tablet at bedtime Metoprolol tartrate '25mg'$ : take 1 tablet  before breakfast  Levothyroxine 150 mcg: take 1.5 tablet before breakfast on Mondays and 1 tablet before breakfast on all other days Montelukast 10 mg: take 1 tablet before breakfast Duloxetine 30 mg: take 1 tablet before breakfast Myrbetriq 25 mg: take 1 tablet at bedtime   Patient will need a short fill: No short fill needed   Coordinated acute fill: No short fill needed   Patient's daughter declined the following medications: No medications  declined   Confirmed delivery date of 11/16/2022, advised patient that pharmacy will contact them the morning of delivery.  Care Gaps: AWV - scheduled for 03/23/2023 Last BP - 126/57 on 09/22/2022 Last A1C - 5.1 on 01/28/2022 Covid - overdue Mammogram - overdue Tdap - overdue AWV - upcoming  Star Rating Drugs: Atorvastatin 10 mg - last filled 10/13/2022 30 DS at [facility] [phone]

## 2022-11-04 DIAGNOSIS — R41841 Cognitive communication deficit: Secondary | ICD-10-CM | POA: Diagnosis not present

## 2022-11-04 DIAGNOSIS — M6281 Muscle weakness (generalized): Secondary | ICD-10-CM | POA: Diagnosis not present

## 2022-11-04 DIAGNOSIS — R488 Other symbolic dysfunctions: Secondary | ICD-10-CM | POA: Diagnosis not present

## 2022-11-06 DIAGNOSIS — R488 Other symbolic dysfunctions: Secondary | ICD-10-CM | POA: Diagnosis not present

## 2022-11-06 DIAGNOSIS — M6281 Muscle weakness (generalized): Secondary | ICD-10-CM | POA: Diagnosis not present

## 2022-11-06 DIAGNOSIS — R41841 Cognitive communication deficit: Secondary | ICD-10-CM | POA: Diagnosis not present

## 2022-11-08 DIAGNOSIS — R41841 Cognitive communication deficit: Secondary | ICD-10-CM | POA: Diagnosis not present

## 2022-11-08 DIAGNOSIS — R488 Other symbolic dysfunctions: Secondary | ICD-10-CM | POA: Diagnosis not present

## 2022-11-08 DIAGNOSIS — M6281 Muscle weakness (generalized): Secondary | ICD-10-CM | POA: Diagnosis not present

## 2022-11-14 DIAGNOSIS — R41841 Cognitive communication deficit: Secondary | ICD-10-CM | POA: Diagnosis not present

## 2022-11-14 DIAGNOSIS — R488 Other symbolic dysfunctions: Secondary | ICD-10-CM | POA: Diagnosis not present

## 2022-11-14 DIAGNOSIS — M6281 Muscle weakness (generalized): Secondary | ICD-10-CM | POA: Diagnosis not present

## 2022-11-18 DIAGNOSIS — R41841 Cognitive communication deficit: Secondary | ICD-10-CM | POA: Diagnosis not present

## 2022-11-18 DIAGNOSIS — R488 Other symbolic dysfunctions: Secondary | ICD-10-CM | POA: Diagnosis not present

## 2022-11-18 DIAGNOSIS — M6281 Muscle weakness (generalized): Secondary | ICD-10-CM | POA: Diagnosis not present

## 2022-11-21 DIAGNOSIS — R488 Other symbolic dysfunctions: Secondary | ICD-10-CM | POA: Diagnosis not present

## 2022-11-21 DIAGNOSIS — M6281 Muscle weakness (generalized): Secondary | ICD-10-CM | POA: Diagnosis not present

## 2022-11-21 DIAGNOSIS — R41841 Cognitive communication deficit: Secondary | ICD-10-CM | POA: Diagnosis not present

## 2022-11-22 DIAGNOSIS — R488 Other symbolic dysfunctions: Secondary | ICD-10-CM | POA: Diagnosis not present

## 2022-11-22 DIAGNOSIS — M6281 Muscle weakness (generalized): Secondary | ICD-10-CM | POA: Diagnosis not present

## 2022-11-22 DIAGNOSIS — R41841 Cognitive communication deficit: Secondary | ICD-10-CM | POA: Diagnosis not present

## 2022-11-24 DIAGNOSIS — R488 Other symbolic dysfunctions: Secondary | ICD-10-CM | POA: Diagnosis not present

## 2022-11-24 DIAGNOSIS — M6281 Muscle weakness (generalized): Secondary | ICD-10-CM | POA: Diagnosis not present

## 2022-11-24 DIAGNOSIS — R41841 Cognitive communication deficit: Secondary | ICD-10-CM | POA: Diagnosis not present

## 2022-11-29 DIAGNOSIS — R488 Other symbolic dysfunctions: Secondary | ICD-10-CM | POA: Diagnosis not present

## 2022-11-29 DIAGNOSIS — M6281 Muscle weakness (generalized): Secondary | ICD-10-CM | POA: Diagnosis not present

## 2022-11-29 DIAGNOSIS — R41841 Cognitive communication deficit: Secondary | ICD-10-CM | POA: Diagnosis not present

## 2022-12-01 ENCOUNTER — Telehealth: Payer: Self-pay | Admitting: Pharmacist

## 2022-12-01 NOTE — Chronic Care Management (AMB) (Signed)
Chronic Care Management Pharmacy Assistant   Name: [name]  MRN: [id number] DOB: 29-Jul-1935  Reason for Encounter: Medication Review / Medication Coordination Call   Recent office visits:  None  Recent consult visits:  None  Hospital visits:  None  Medications: Outpatient Encounter Medications as of 12/01/2022  Medication Sig   atorvastatin (LIPITOR) 10 MG tablet Take 1 tablet (10 mg total) by mouth daily.   Cholecalciferol (VITAMIN D3) 50 MCG (2000 UT) capsule Take 2,000 Units by mouth at bedtime.   DULoxetine (CYMBALTA) 30 MG capsule 1 capsule daily (Patient taking differently: Take 30 mg by mouth daily.)   furosemide (LASIX) 40 MG tablet Take 1.5 tablets (60 mg total) by mouth 2 (two) times daily. (Patient taking differently: Take 80 mg by mouth 2 (two) times daily.)   levothyroxine (SYNTHROID) 150 MCG tablet TAKE 1 AND 1/2 TABLETS BY MOUTH ONCE WEEKLY BEFORE BREAKFAST ON MONDAY and TAKE ONE TABLET BY MOUTH ONCE WEEKLY BEFORE BREAKFAST ON  TUESDAYS and TAKE ONE TABLET BY MOUTH ONCE WEEKLY BEFORE BREAKFAST ON WEDNESDAYS and TAKE ONE TABLET BY MOUTH ONCE WEEKLY BEFORE BREAKFAST ON THURSDAYS and TAKE ONE TABLET BY MOUTH ONCE WEEKLY BEFORE BREAKFAST ON FRIDAYS and TAKE ONE TABLET BY MOUTH ONCE WEEKLY BEFORE BREAKFAST ON SATURDAY and TAKE ONE TABLET BY MOUTH ONCE WEEKLY BEFORE BREAKFAST ON SUNDAYS Strength: 150 mcg   metoprolol tartrate (LOPRESSOR) 25 MG tablet TAKE ONE TABLET BY MOUTH BEFORE BREAKFAST and TAKE ONE TABLET BY MOUTH EVERYDAY AT BEDTIME (Patient taking differently: Take 25 mg by mouth.)   mirabegron ER (MYRBETRIQ) 25 MG TB24 tablet Take 1 tablet (25 mg total) by mouth daily.   montelukast (SINGULAIR) 10 MG tablet 1 tablet daily (Patient taking differently: Take 10 mg by mouth at bedtime as needed (difficulty breathing).)   traMADol (ULTRAM) 50 MG tablet Take 1-2 tablets (50-100 mg total) by mouth every 6 (six) hours as needed for moderate pain. (Patient not taking:  Reported on 09/22/2022)   No facility-administered encounter medications on file as of 12/01/2022.   Reviewed chart for medication changes ahead of medication coordination call.  BP Readings from Last 3 Encounters:  09/22/22 (!) 126/57  08/01/22 120/80  07/22/22 (!) 153/86    Lab Results  Component Value Date   HGBA1C 5.1 01/28/2022     Patient obtains medications through Adherence Packaging  30 Days    Last adherence delivery included: Furosemide 20 mg: Take 4 tablets before breakfast and at lunch Vitamin D 69mg (2000 iu): take 1 tablet at bedtime Atorvastatin 10 mg: take 1 tablet at bedtime Metoprolol tartrate '25mg'$ : take 1 tablet before breakfast  Levothyroxine 150 mcg: take 1.5 tablet before breakfast on Mondays and 1 tablet before breakfast on all other days Montelukast 10 mg: take 1 tablet before breakfast Duloxetine 30 mg: take 1 tablet before breakfast Myrbetriq 25 mg: take 1 tablet at bedtime    Patient's daughter declined (meds) last month:   None   Patient is due for next adherence delivery on: 12/15/2022   Reviewed medications and coordinated delivery.   This delivery to include: Furosemide 20 mg: Take 4 tablets before breakfast and at lunch Vitamin D 555m (2000 iu): take 1 tablet at bedtime Atorvastatin 10 mg: take 1 tablet at bedtime Metoprolol tartrate '25mg'$ : take 1 tablet before breakfast  Levothyroxine 150 mcg: take 1.5 tablet before breakfast on Mondays and 1 tablet before breakfast on all other days Montelukast 10 mg: take 1 tablet before breakfast Duloxetine 30  mg: take 1 tablet before breakfast Myrbetriq 25 mg: take 1 tablet at bedtime   Patient will need a short fill: No short fill needed   Coordinated acute fill: No short fill needed   Patient's daughter declined the following medications:   Confirmed delivery date of 12/15/2022 with patients daughter, advised patient that pharmacy will contact them the morning of delivery.  Care Gaps: AWV  - scheduled for 03/23/2023 Last BP - 126/57 on 09/22/2022 Last A1C - 5.1 on 01/28/2022 Mammogram - overdue Tdap - overdue Covid - overdue AWV - upcoming  Star Rating Drugs: Atorvastatin 10 mg - last filled 11/11/2022 30 DS at [facility] [phone]

## 2022-12-06 DIAGNOSIS — R488 Other symbolic dysfunctions: Secondary | ICD-10-CM | POA: Diagnosis not present

## 2022-12-06 DIAGNOSIS — R41841 Cognitive communication deficit: Secondary | ICD-10-CM | POA: Diagnosis not present

## 2022-12-06 DIAGNOSIS — M6281 Muscle weakness (generalized): Secondary | ICD-10-CM | POA: Diagnosis not present

## 2022-12-09 DIAGNOSIS — R488 Other symbolic dysfunctions: Secondary | ICD-10-CM | POA: Diagnosis not present

## 2022-12-09 DIAGNOSIS — R41841 Cognitive communication deficit: Secondary | ICD-10-CM | POA: Diagnosis not present

## 2022-12-09 DIAGNOSIS — M6281 Muscle weakness (generalized): Secondary | ICD-10-CM | POA: Diagnosis not present

## 2022-12-15 DIAGNOSIS — M6281 Muscle weakness (generalized): Secondary | ICD-10-CM | POA: Diagnosis not present

## 2022-12-15 DIAGNOSIS — R488 Other symbolic dysfunctions: Secondary | ICD-10-CM | POA: Diagnosis not present

## 2022-12-15 DIAGNOSIS — R41841 Cognitive communication deficit: Secondary | ICD-10-CM | POA: Diagnosis not present

## 2022-12-16 DIAGNOSIS — M6281 Muscle weakness (generalized): Secondary | ICD-10-CM | POA: Diagnosis not present

## 2022-12-16 DIAGNOSIS — R41841 Cognitive communication deficit: Secondary | ICD-10-CM | POA: Diagnosis not present

## 2022-12-16 DIAGNOSIS — R488 Other symbolic dysfunctions: Secondary | ICD-10-CM | POA: Diagnosis not present

## 2022-12-21 ENCOUNTER — Encounter: Payer: Self-pay | Admitting: Family Medicine

## 2023-01-04 ENCOUNTER — Telehealth: Payer: Self-pay | Admitting: Pharmacist

## 2023-01-04 NOTE — Progress Notes (Signed)
Care Management & Coordination Services Pharmacy Team   Name: [name]  MRN: [id number] DOB: 1935/02/22  Reason for Encounter: Medication coordination and delivery  Recent office visits:  None  Recent consult visits:  None  Hospital visits:  None  Medications: Outpatient Encounter Medications as of 01/04/2023  Medication Sig   atorvastatin (LIPITOR) 10 MG tablet Take 1 tablet (10 mg total) by mouth daily.   Cholecalciferol (VITAMIN D3) 50 MCG (2000 UT) capsule Take 2,000 Units by mouth at bedtime.   DULoxetine (CYMBALTA) 30 MG capsule 1 capsule daily (Patient taking differently: Take 30 mg by mouth daily.)   furosemide (LASIX) 40 MG tablet Take 1.5 tablets (60 mg total) by mouth 2 (two) times daily. (Patient taking differently: Take 80 mg by mouth 2 (two) times daily.)   levothyroxine (SYNTHROID) 150 MCG tablet TAKE 1 AND 1/2 TABLETS BY MOUTH ONCE WEEKLY BEFORE BREAKFAST ON MONDAY and TAKE ONE TABLET BY MOUTH ONCE WEEKLY BEFORE BREAKFAST ON  TUESDAYS and TAKE ONE TABLET BY MOUTH ONCE WEEKLY BEFORE BREAKFAST ON WEDNESDAYS and TAKE ONE TABLET BY MOUTH ONCE WEEKLY BEFORE BREAKFAST ON THURSDAYS and TAKE ONE TABLET BY MOUTH ONCE WEEKLY BEFORE BREAKFAST ON FRIDAYS and TAKE ONE TABLET BY MOUTH ONCE WEEKLY BEFORE BREAKFAST ON SATURDAY and TAKE ONE TABLET BY MOUTH ONCE WEEKLY BEFORE BREAKFAST ON SUNDAYS Strength: 150 mcg   metoprolol tartrate (LOPRESSOR) 25 MG tablet TAKE ONE TABLET BY MOUTH BEFORE BREAKFAST and TAKE ONE TABLET BY MOUTH EVERYDAY AT BEDTIME (Patient taking differently: Take 25 mg by mouth.)   mirabegron ER (MYRBETRIQ) 25 MG TB24 tablet Take 1 tablet (25 mg total) by mouth daily.   montelukast (SINGULAIR) 10 MG tablet 1 tablet daily (Patient taking differently: Take 10 mg by mouth at bedtime as needed (difficulty breathing).)   traMADol (ULTRAM) 50 MG tablet Take 1-2 tablets (50-100 mg total) by mouth every 6 (six) hours as needed for moderate pain. (Patient not taking: Reported on  09/22/2022)   No facility-administered encounter medications on file as of 01/04/2023.   BP Readings from Last 3 Encounters:  09/22/22 (!) 126/57  08/01/22 120/80  07/22/22 (!) 153/86    Pulse Readings from Last 3 Encounters:  09/22/22 80  08/01/22 63  07/22/22 66    Lab Results  Component Value Date/Time   HGBA1C 5.1 01/28/2022 10:59 AM   Lab Results  Component Value Date   CREATININE 2.08 (H) 08/02/2022   BUN 33 (H) 08/02/2022   GFR 25.20 (L) 04/27/2022   GFRNONAA 23 (L) 07/22/2022   GFRAA 32 (L) 11/06/2015   NA 143 08/02/2022   K 3.7 08/02/2022   CALCIUM 8.3 (L) 08/02/2022   CO2 23 08/02/2022   Last adherence delivery date:12/15/2022      Patient is due for next adherence delivery on: 01/16/2023  Spoke with patient on 01/06/2023 reviewed medications and coordinated delivery. Verified with patients daughter.   This delivery to include: Adherence Packaging  30 Days  Furosemide 20 mg: Take 4 tablets before breakfast and at dinner Vitamin D 60mg (2000 iu): take 1 tablet at dinner Atorvastatin 10 mg: take 1 tablet at dinner Metoprolol tartrate '25mg'$ : take 1 tablet before breakfast  Levothyroxine 150 mcg: take 1.5 tablet before breakfast on Mondays and 1 tablet before breakfast on all other days Montelukast 10 mg: take 1 tablet before breakfast Duloxetine 30 mg: take 1 tablet before breakfast Myrbetriq 25 mg: take 1 tablet at dinner  Patient declined the following medications this month: No medications declined  Patients daughter has requested and states she verified with Dr. Legrand Como to change patients packaging to twice a day packaging.   Confirmed delivery date of 01/16/2023, advised patient that pharmacy will contact them the morning of delivery.  Any concerns about your medications? Patients daughter denies  How often do you forget or accidentally miss a dose? Patients daughter states on occasion due to multiple med packs. Packaging is being changed to twice a  day packaging and they have help coming to patients house daily to be sure she is not missing any doses.   Is patient in packaging Yes  Cycle dispensing form sent to [facility] for review.  Care Gaps: AWV - scheduled for 03/23/2023 Last BP - 126/57 on 09/22/2022 Last A1C - 5.1 on 01/28/2022 Mammogram - overdue Tdap - overdue Covid - overdue  Star Rating Drugs: Atorvastatin 10 mg - last filled 12/09/2022 30 DS at [facility] [phone]

## 2023-02-01 ENCOUNTER — Other Ambulatory Visit: Payer: Self-pay | Admitting: Family Medicine

## 2023-02-01 DIAGNOSIS — E7800 Pure hypercholesterolemia, unspecified: Secondary | ICD-10-CM

## 2023-02-01 DIAGNOSIS — R32 Unspecified urinary incontinence: Secondary | ICD-10-CM

## 2023-02-01 NOTE — Telephone Encounter (Signed)
Ok to refill 

## 2023-02-02 ENCOUNTER — Telehealth: Payer: Self-pay

## 2023-02-02 NOTE — Progress Notes (Signed)
Care Management & Coordination Services Pharmacy Team  Reason for Encounter: Medication coordination and delivery  Contacted patient to discuss medications and coordinate delivery from [facility]. Spoke with patient on 02/02/2023   Cycle dispensing form sent to [facility] for review.   Last adherence delivery date:01/16/2023      Patient is due for next adherence delivery on: 02/14/2023  This delivery to include: Adherence Packaging  30 Days  Furosemide 20 mg: Take 4 tablets before breakfast and at dinner Vitamin D 18mg (2000 iu): take 1 tablet at dinner Atorvastatin 10 mg: take 1 tablet at dinner Metoprolol tartrate 239m take 1 tablet before breakfast  Levothyroxine 150 mcg: take 1.5 tablet before breakfast on Mondays and 1 tablet before breakfast on all other days Montelukast 10 mg: take 1 tablet before breakfast Duloxetine 30 mg: take 1 tablet before breakfast Myrbetriq 25 mg: take 1 tablet at dinner  Patient declined the following medications this month: No medications declined   Confirmed delivery date of 02/14/2023, advised patient that pharmacy will contact them the morning of delivery.  Any concerns about your medications? Patients daughter denies  How often do you forget or accidentally miss a dose? Patients daughter states they now have someone to go twice daily to be sure patient is taking her medications. She is no longer having any missed doses.   Is patient in packaging Yes  If yes  What is the date on your next pill pack? 02/02/2023 PM dose  Any concerns or issues with your packaging? Patients daughter denies  Recent blood pressure readings are as follows:    Chart review: Recent office visits:  None  Recent consult visits:  None  Hospital visits:  None  Medications: Outpatient Encounter Medications as of 02/02/2023  Medication Sig   atorvastatin (LIPITOR) 10 MG tablet TAKE ONE TABLET BY MOUTH EVERY EVENING   Cholecalciferol (VITAMIN  D3) 50 MCG (2000 UT) capsule Take 2,000 Units by mouth at bedtime.   DULoxetine (CYMBALTA) 30 MG capsule 1 capsule daily (Patient taking differently: Take 30 mg by mouth daily.)   furosemide (LASIX) 40 MG tablet Take 1.5 tablets (60 mg total) by mouth 2 (two) times daily. (Patient taking differently: Take 80 mg by mouth 2 (two) times daily.)   levothyroxine (SYNTHROID) 150 MCG tablet TAKE 1 AND 1/2 TABLETS BY MOUTH ONCE WEEKLY BEFORE BREAKFAST ON MONDAY and TAKE ONE TABLET BY MOUTH ONCE WEEKLY BEFORE BREAKFAST ON  TUESDAYS and TAKE ONE TABLET BY MOUTH ONCE WEEKLY BEFORE BREAKFAST ON WEDNESDAYS and TAKE ONE TABLET BY MOUTH ONCE WEEKLY BEFORE BREAKFAST ON THURSDAYS and TAKE ONE TABLET BY MOUTH ONCE WEEKLY BEFORE BREAKFAST ON FRIDAYS and TAKE ONE TABLET BY MOUTH ONCE WEEKLY BEFORE BREAKFAST ON SATURDAY and TAKE ONE TABLET BY MOUTH ONCE WEEKLY BEFORE BREAKFAST ON SUNDAYS Strength: 150 mcg   metoprolol tartrate (LOPRESSOR) 25 MG tablet TAKE ONE TABLET BY MOUTH BEFORE BREAKFAST and TAKE ONE TABLET BY MOUTH EVERYDAY AT BEDTIME (Patient taking differently: Take 25 mg by mouth.)   montelukast (SINGULAIR) 10 MG tablet 1 tablet daily (Patient taking differently: Take 10 mg by mouth at bedtime as needed (difficulty breathing).)   MYRBETRIQ 25 MG TB24 tablet TAKE ONE TABLET BY MOUTH EVERY EVENING   traMADol (ULTRAM) 50 MG tablet Take 1-2 tablets (50-100 mg total) by mouth every 6 (six) hours as needed for moderate pain. (Patient not taking: Reported on 09/22/2022)   No facility-administered encounter medications on file as of 02/02/2023.   BP Readings from Last  3 Encounters:  09/22/22 (!) 126/57  08/01/22 120/80  07/22/22 (!) 153/86    Pulse Readings from Last 3 Encounters:  09/22/22 80  08/01/22 63  07/22/22 66    Lab Results  Component Value Date/Time   HGBA1C 5.1 01/28/2022 10:59 AM   Lab Results  Component Value Date   CREATININE 2.08 (H) 08/02/2022   BUN 33 (H) 08/02/2022   GFR 25.20 (L)  04/27/2022   GFRNONAA 23 (L) 07/22/2022   GFRAA 32 (L) 11/06/2015   NA 143 08/02/2022   K 3.7 08/02/2022   CALCIUM 8.3 (L) 08/02/2022   CO2 23 08/02/2022   [facility] Pharmacist Assistant [phone]

## 2023-03-06 ENCOUNTER — Other Ambulatory Visit: Payer: Self-pay | Admitting: *Deleted

## 2023-03-06 MED ORDER — DULOXETINE HCL 30 MG PO CPEP: ORAL_CAPSULE | ORAL | 0 refills | Status: DC

## 2023-03-06 MED ORDER — MONTELUKAST SODIUM 10 MG PO TABS: ORAL_TABLET | ORAL | 0 refills | Status: DC

## 2023-03-06 NOTE — Telephone Encounter (Signed)
Rx done. 

## 2023-03-15 ENCOUNTER — Telehealth: Payer: Self-pay | Admitting: Family Medicine

## 2023-03-15 NOTE — Telephone Encounter (Signed)
Contacted [name] to schedule their annual wellness visit. Appointment made for 03/23/23.  [facility] AWV direct phone # [phone]   Sent my chart message letting patient know her appt on 4/4 time has changed from 2 to 5

## 2023-03-23 ENCOUNTER — Encounter (INDEPENDENT_AMBULATORY_CARE_PROVIDER_SITE_OTHER): Payer: PPO | Admitting: Family Medicine

## 2023-03-23 NOTE — Progress Notes (Signed)
Error Both nurse assistant and I attempted multiple times at numbers listed in chart to reach patient for appointment. Unable to reach. Advised to call office to reschedule.

## 2023-04-12 ENCOUNTER — Other Ambulatory Visit: Payer: Self-pay | Admitting: Family Medicine

## 2023-05-25 ENCOUNTER — Ambulatory Visit: Payer: PPO | Admitting: Neurology

## 2023-05-25 ENCOUNTER — Encounter: Payer: Self-pay | Admitting: Neurology

## 2023-05-25 VITALS — BP 131/53 | HR 61 | Ht 67.0 in | Wt 200.0 lb

## 2023-05-25 DIAGNOSIS — W19XXXD Unspecified fall, subsequent encounter: Secondary | ICD-10-CM | POA: Diagnosis not present

## 2023-05-25 DIAGNOSIS — G3184 Mild cognitive impairment, so stated: Secondary | ICD-10-CM | POA: Diagnosis not present

## 2023-05-25 MED ORDER — DULOXETINE HCL 60 MG PO CPEP: ORAL_CAPSULE | ORAL | 3 refills | Status: AC

## 2023-05-25 MED ORDER — DONEPEZIL HCL 5 MG PO TABS: 5.0000 mg | ORAL_TABLET | Freq: Every day | ORAL | 3 refills | Status: AC

## 2023-05-25 NOTE — Patient Instructions (Signed)
Dementia labs including ATN profile, TSH and B12 levels Will increase her duloxetine to 60 mg daily Start Aricept 5 mg nightly, side effect of the medication include diarrhea, dizziness and vivid dreams.  If able to tolerate medication, will increase to 10 mg nightly. Continue your other medications Follow-up in 1 year or sooner if worse   There are well-accepted and sensible ways to reduce risk for Alzheimers disease and other degenerative brain disorders .  Exercise Daily Walk A daily 20 minute walk should be part of your routine. Disease related apathy can be a significant roadblock to exercise and the only way to overcome this is to make it a daily routine and perhaps have a reward at the end (something your loved one loves to eat or drink perhaps) or a personal trainer coming to the home can also be very useful. Most importantly, the patient is much more likely to exercise if the caregiver / spouse does it with him/her. In general a structured, repetitive schedule is best.  General Health: Any diseases which effect your body will effect your brain such as a pneumonia, urinary infection, blood clot, heart attack or stroke. Keep contact with your primary care doctor for regular follow ups.  Sleep. A good nights sleep is healthy for the brain. Seven hours is recommended. If you have insomnia or poor sleep habits we can give you some instructions. If you have sleep apnea wear your mask.  Diet: Eating a heart healthy diet is also a good idea; fish and poultry instead of red meat, nuts (mostly non-peanuts), vegetables, fruits, olive oil or canola oil (instead of butter), minimal salt (use other spices to flavor foods), whole grain rice, bread, cereal and pasta and wine in moderation.Research is now showing that the MIND diet, which is a combination of The Mediterranean diet and the DASH diet, is beneficial for cognitive processing and longevity. Information about this diet can be found in The MIND  Diet, a book by Alonna Minium, MS, RDN, and online at [employer].es  Finances, Power of [address] and Advance Directives: You should consider putting legal safeguards in place with regard to financial and medical decision making. While the spouse always has power of attorney for medical and financial issues in the absence of any form, you should consider what you want in case the spouse / caregiver is no longer around or capable of making decisions.

## 2023-05-25 NOTE — Progress Notes (Signed)
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  Patient feels like her memory is okay, she does not have any major concerns.  She lives at a independent living facility, at [facility] and feels that she is okay.  Per daughter-in-law.  Patient memory has been getting worse, she asked the same questions over and over and she is also repetitive.  She is very sedentary in her ways.  She has someone to help her with her medication otherwise she will make mistake.  She has a couple falls at home.  Again [facility], initially she was doing physical therapy but no longer.      TBI:   No past history of TBI Stroke:   no past history of stroke Seizures:    no past history of seizures Sleep:   no history of sleep apnea.  Mood:   patient denies anxiety and depression but she is on Cymbalta  Family history of Dementia:   Denies  Functional status: independent in all  ADLs and IADLs Patient lives in an independent living facility . Cooking: no Cleaning: no Shopping: no Bathing: with help from a nurse  Toileting: no help needed  Driving: not anymore  Bills: Son took over the bills  Medications: New need help  Ever left the stove on by accident?: n/a Forget how to use items around the house?: denies  Getting lost going to familiar places?: denies, when she was driving, she drove to the  wrong restaurant to meet her daughter for lunch Forgetting loved ones names?: some  Word finding difficulty? Yes some Sleep: good    OTHER MEDICAL CONDITIONS: Hypertension, Hyperlipidemia, Hypothyroidism,    REVIEW OF SYSTEMS: Full 14 system review of systems performed and negative with exception of: As noted In the HPI   ALLERGIES: No Known Allergies  HOME MEDICATIONS: Outpatient Medications Prior to Visit  Medication Sig Dispense Refill   atorvastatin (LIPITOR) 10 MG tablet TAKE ONE TABLET BY MOUTH EVERY EVENING 90 tablet 1   Cholecalciferol (VITAMIN D3) 50 MCG (2000 UT) capsule Take 2,000 Units by mouth at bedtime.     furosemide (LASIX) 40 MG tablet Take 1.5 tablets (60 mg total) by mouth 2 (two) times daily. (Patient taking differently: Take 80 mg by mouth 2 (two) times daily.) 270 tablet 1   levothyroxine (SYNTHROID) 150 MCG tablet TAKE 1 AND 1/2 TABLETS BY MOUTH ONCE WEEKLY BEFORE BREAKFAST ON MONDAY and TAKE ONE TABLET BY MOUTH ONCE WEEKLY BEFORE BREAKFAST ON  TUESDAYS and TAKE ONE TABLET BY MOUTH ONCE WEEKLY BEFORE BREAKFAST ON WEDNESDAYS and TAKE ONE TABLET BY MOUTH ONCE WEEKLY BEFORE BREAKFAST ON THURSDAYS and TAKE ONE TABLET BY MOUTH ONCE WEEKLY BEFORE BREAKFAST ON FRIDAYS and TAKE ONE TABLET BY MOUTH ONCE WEEKLY BEFORE BREAKFAST ON SATURDAY and TAKE ONE TABLET BY MOUTH ONCE WEEKLY BEFORE BREAKFAST ON SUNDAYS 108 tablet 0   metoprolol tartrate (LOPRESSOR) 25 MG tablet  TAKE ONE TABLET BY MOUTH BEFORE BREAKFAST and TAKE ONE TABLET BY MOUTH EVERYDAY AT BEDTIME (Patient taking differently: Take 25 mg by mouth.) 180 tablet 1   montelukast (SINGULAIR) 10 MG tablet 1 tablet daily 90 tablet 0   MYRBETRIQ 25 MG TB24 tablet TAKE ONE TABLET BY MOUTH EVERY EVENING 90 tablet 1   DULoxetine (CYMBALTA) 30 MG capsule 1 capsule daily 90 capsule 0   traMADol (ULTRAM) 50 MG tablet Take 1-2 tablets (50-100 mg total) by mouth every 6 (six) hours as needed for moderate pain. (Patient not taking:  Reported on 09/22/2022) 15 tablet 0   No facility-administered medications prior to visit.    PAST MEDICAL HISTORY: Past Medical History:  Diagnosis Date   Asthma    no problem in last year   COLONIC POLYPS, HX OF 11/06/2009   Cough    nonproductive    DYSPNEA ON EXERTION 05/06/2008   GERD 06/18/2007   Goiter    hx of 1973 removed   HYPERCHOLESTEROLEMIA, PURE 07/09/2007   HYPOTHYROIDISM 06/18/2007   Insomnia    NEPHROLITHIASIS, HX OF 08/07/2008   Obesity    OSTEOARTHRITIS 05/07/2008   Renal cyst    RENAL INSUFFICIENCY 05/06/2008    PAST SURGICAL HISTORY: Past Surgical History:  Procedure Laterality Date   ABDOMINAL HYSTERECTOMY     partial   CARPAL TUNNEL RELEASE Right 12/31/2019   Procedure: CARPAL TUNNEL RELEASE ENDOSCOPIC;  Surgeon: Sheral Apley, MD;  Location: [facility];  Service: Orthopedics;  Laterality: Right;   CATARACT EXTRACTION Bilateral    JOINT REPLACEMENT     bilateral knees   KNEE ARTHROSCOPY Left    LITHOTRIPSY     NASAL SINUS SURGERY  1990   PARATHYROIDECTOMY Left 07/21/2022   Procedure: LEFT NECK EXPLORATION AND PARATHYROIDECTOMY;  Surgeon: Darnell Level, MD;  Location: [facility];  Service: General;  Laterality: Left;   PARATHYROIDECTOMY  05/2022   SINUS ENDO W/FUSION Right 11/06/2015   Procedure: ENDOSCOPIC SINUS SURGERY WITH NAVIGATION;  Surgeon: Suzanna Obey, MD;  Location: [facility] OR;  Service: ENT;  Laterality: Right;  Endoscopic sinus surgery with Fusion protocol, right frontal and ethmoid sinusotomy    TONSILLECTOMY     TOTAL KNEE ARTHROPLASTY      FAMILY HISTORY: Family History  Problem Relation Age of Onset   Heart Problems Father    Multiple myeloma Other    Colon cancer Other    Hypertension Neg Hx        family   Cancer Neg Hx        colon ca , prostate ca    SOCIAL HISTORY: Social History   Socioeconomic History   Marital status: Widowed    Spouse name: Not on file   Number of children: 4   Years of  education: Not on file   Highest education level: Bachelor's degree (e.g., BA, AB, BS)  Occupational History   Occupation: Retired  Tobacco Use   Smoking status: Former    Packs/day: 0.25    Years: 6.00    Additional pack years: 0.00    Total pack years: 1.50    Types: Cigarettes    Quit date: 12/19/1978    Years since quitting: 44.4   Smokeless tobacco: Never  Vaping Use   Vaping Use: Never used  Substance and Sexual Activity   Alcohol use: Never   Drug use: No   Sexual activity: Not Currently  Other Topics Concern   Not on file  Social History Narrative  Not on file   Social Determinants of Health   Financial Resource Strain: Low Risk  (04/05/2022)   Overall Financial Resource Strain (CARDIA)    Difficulty of Paying Living Expenses: Not very hard  Food Insecurity: No Food Insecurity (04/05/2022)   Hunger Vital Sign    Worried About Running Out of Food in the Last Year: Never true    Ran Out of Food in the Last Year: Never true  Transportation Needs: Unknown (04/05/2022)   PRAPARE - Administrator, Civil Service (Medical): No    Lack of Transportation (Non-Medical): Not on file  Physical Activity: Unknown (04/05/2022)   Exercise Vital Sign    Days of Exercise per Week: 0 days    Minutes of Exercise per Session: Not on file  Recent Concern: Physical Activity - Inactive (04/05/2022)   Exercise Vital Sign    Days of Exercise per Week: 0 days    Minutes of Exercise per Session: 0 min  Stress: Stress Concern Present (04/05/2022)   [employer] of Occupational Health - Occupational Stress Questionnaire    Feeling of Stress : To some extent  Social Connections: Unknown (04/05/2022)   Social Connection and Isolation Panel [NHANES]    Frequency of Communication with Friends and Family: Three times a week    Frequency of Social Gatherings with Friends and Family: Once a week    Attends Religious Services: Patient declined    Database administrator or  Organizations: No    Attends Engineer, structural: Not on file    Marital Status: Widowed  Intimate Partner Violence: Not At Risk (03/21/2022)   Humiliation, Afraid, Rape, and Kick questionnaire    Fear of Current or Ex-Partner: No    Emotionally Abused: No    Physically Abused: No    Sexually Abused: No    PHYSICAL EXAM  GENERAL EXAM/CONSTITUTIONAL: Vitals:  Vitals:   05/25/23 1322  BP: (!) 131/53  Pulse: 61  Weight: 199 lb 15.3 oz (90.7 kg)  Height: 5\' 7"  (1.702 m)   Body mass index is 31.32 kg/m. Wt Readings from Last 3 Encounters:  05/25/23 199 lb 15.3 oz (90.7 kg)  09/22/22 200 lb (90.7 kg)  08/01/22 240 lb 3.2 oz (109 kg)   Patient is in no distress; well developed, nourished and groomed; neck is supple  MUSCULOSKELETAL: Gait, strength, tone, movements noted in Neurologic exam below  NEUROLOGIC: MENTAL STATUS:      No data to display            05/25/2023    1:24 PM 07/30/2021    7:29 AM  Montreal Cognitive Assessment   Visuospatial/ Executive (0/5) 4 3  Naming (0/3) 3 2  Attention: Read list of digits (0/2) 2 2  Attention: Read list of letters (0/1) 1 1  Attention: Serial 7 subtraction starting at 100 (0/3) 2 3  Language: Repeat phrase (0/2) 1 2  Language : Fluency (0/1) 1 1  Abstraction (0/2) 2 1  Delayed Recall (0/5) 0 0  Orientation (0/6) 6 6  Total 22 21  Adjusted Score (based on education)  21     CRANIAL NERVE:  2nd, 3rd, 4th, 6th- visual fields full to confrontation, extraocular muscles intact, no nystagmus 5th - facial sensation symmetric 7th - facial strength symmetric 8th - hearing intact 9th - palate elevates symmetrically, uvula midline 11th - shoulder shrug symmetric 12th - tongue protrusion midline  MOTOR:  normal bulk and tone, full strength in the BUE,  BLE. There is bilateral pitting edema up to knees   SENSORY:  normal and symmetric to light touch  COORDINATION:  finger-nose-finger, fine finger movements  normal  GAIT/STATION:  normal   DIAGNOSTIC DATA (LABS, IMAGING, TESTING) - I reviewed patient records, labs, notes, testing and imaging myself where available.  Lab Results  Component Value Date   WBC 4.8 07/18/2022   HGB 13.5 07/18/2022   HCT 41.3 07/18/2022   MCV 92.8 07/18/2022   PLT 180 07/18/2022      Component Value Date/Time   NA 143 08/02/2022 1431   K 3.7 08/02/2022 1431   CL 104 08/02/2022 1431   CO2 23 08/02/2022 1431   GLUCOSE 88 08/02/2022 1431   GLUCOSE 99 10/27/2006 0950   BUN 33 (H) 08/02/2022 1431   CREATININE 2.08 (H) 08/02/2022 1431   CALCIUM 8.3 (L) 08/02/2022 1431   PROT 6.1 01/28/2022 1059   ALBUMIN 3.4 (L) 01/28/2022 1059   AST 16 01/28/2022 1059   ALT 13 01/28/2022 1059   ALKPHOS 110 01/28/2022 1059   BILITOT 0.8 01/28/2022 1059   GFRNONAA 23 (L) 07/22/2022 0325   GFRAA 32 (L) 11/06/2015 1400   Lab Results  Component Value Date   CHOL 153 01/28/2022   HDL 55.50 01/28/2022   LDLCALC 80 01/28/2022   LDLDIRECT 174.2 07/09/2007   TRIG 86.0 01/28/2022   CHOLHDL 3 01/28/2022   Lab Results  Component Value Date   HGBA1C 5.1 01/28/2022   No results found for: "VITAMINB12" Lab Results  Component Value Date   TSH 5.42 01/28/2022    CT Head 09/22/2022 1. Marked severity left posterior parietal and left parietooccipital scalp soft tissue swelling with an associated 3.4 cm x 1.7 cm left posterior parietal scalp hematoma. 2. No acute intracranial abnormality. 3. Generalized cerebral atrophy with widening of the extra-axial spaces and ventricular dilatation. 4. Moderate to marked severity chronic pansinus disease.    ASSESSMENT AND PLAN  88 y.o. year old female with history of hypertension, hyperlipidemia, hypothyroidism who is presenting with memory concern.  Patient feels like her memory is okay, she does live in independent living facility, but her daughter-in-law reports that her memory has been getting worse.  She is more forgetful,  repetitive and asking the same questions over and over.  She does need help with her medications, her finances and need help with showering.  On exam today she scored 22 out of 30 on the MoCA indicative of impairment.  Patient likely has mild cognitive impairment versus mild dementia.  I will start by getting ATN profile to look for Alzheimer disease biomarker.  We will also obtain a B12 and a TSH level.  CT scan of the head obtained last October show generalized cerebral atrophy but no acute abnormality.  I will contact patient to go over the result.  We also discussed medication and we plan to start her on Aricept 5 mg nightly.  We discussed the side effects including vivid dreams, diarrhea and dizziness.  If patient able to tolerate the medication, will increase to 10 mg nightly.  I will see her in 1 year for follow-up or sooner if worse.   1. Mild cognitive impairment   2. Fall, subsequent encounter      Patient Instructions  Dementia labs including ATN profile, TSH and B12 levels Will increase her duloxetine to 60 mg daily Start Aricept 5 mg nightly, side effect of the medication include diarrhea, dizziness and vivid dreams.  If able to tolerate medication,  will increase to 10 mg nightly. Continue your other medications Follow-up in 1 year or sooner if worse   There are well-accepted and sensible ways to reduce risk for Alzheimers disease and other degenerative brain disorders .  Exercise Daily Walk A daily 20 minute walk should be part of your routine. Disease related apathy can be a significant roadblock to exercise and the only way to overcome this is to make it a daily routine and perhaps have a reward at the end (something your loved one loves to eat or drink perhaps) or a personal trainer coming to the home can also be very useful. Most importantly, the patient is much more likely to exercise if the caregiver / spouse does it with him/her. In general a structured, repetitive schedule is  best.  General Health: Any diseases which effect your body will effect your brain such as a pneumonia, urinary infection, blood clot, heart attack or stroke. Keep contact with your primary care doctor for regular follow ups.  Sleep. A good nights sleep is healthy for the brain. Seven hours is recommended. If you have insomnia or poor sleep habits we can give you some instructions. If you have sleep apnea wear your mask.  Diet: Eating a heart healthy diet is also a good idea; fish and poultry instead of red meat, nuts (mostly non-peanuts), vegetables, fruits, olive oil or canola oil (instead of butter), minimal salt (use other spices to flavor foods), whole grain rice, bread, cereal and pasta and wine in moderation.Research is now showing that the MIND diet, which is a combination of The Mediterranean diet and the DASH diet, is beneficial for cognitive processing and longevity. Information about this diet can be found in The MIND Diet, a book by Alonna Minium, MS, RDN, and online at [employer]  Finances, Power of [address] and Advance Directives: You should consider putting legal safeguards in place with regard to financial and medical decision making. While the spouse always has power of attorney for medical and financial issues in the absence of any form, you should consider what you want in case the spouse / caregiver is no longer around or capable of making decisions.   Orders Placed This Encounter  Procedures   TSH   Vitamin B12   ATN PROFILE   Ambulatory referral to Physical Therapy    Meds ordered this encounter  Medications   donepezil (ARICEPT) 5 MG tablet    Sig: Take 1 tablet (5 mg total) by mouth at bedtime.    Dispense:  30 tablet    Refill:  3   DULoxetine (CYMBALTA) 60 MG capsule    Sig: 1 capsule daily    Dispense:  90 capsule    Refill:  3    Patient needs an appt    Return in about 1 year (around 05/24/2024).  I have spent a total of 65  minutes dedicated to this patient today, preparing to see patient, performing a medically appropriate examination and evaluation, ordering tests and/or medications and procedures, and counseling and educating the patient/family/caregiver; independently interpreting result and communicating results to the family/patient/caregiver; and documenting clinical information in the electronic medical record.   Windell Norfolk, MD 05/25/2023, 6:25 PM  [facility] [address], [address] [place], [place] [address] [phone]

## 2023-05-26 ENCOUNTER — Telehealth: Payer: Self-pay | Admitting: Neurology

## 2023-05-26 NOTE — Telephone Encounter (Signed)
Referral faxed to [facility]: Phone: [phone] Fax: [phone]

## 2023-05-30 ENCOUNTER — Other Ambulatory Visit: Payer: Self-pay | Admitting: Neurology

## 2023-05-30 LAB — TSH: TSH: 0.054 u[IU]/mL — ABNORMAL LOW (ref 0.450–4.500)

## 2023-05-30 LAB — ATN PROFILE
A -- Beta-amyloid 42/40 Ratio: 0.128 (ref >0.102)
Beta-amyloid 40: 285.29 pg/mL
Beta-amyloid 42: 36.53 pg/mL
N -- NfL, Plasma: 16.3 pg/mL — ABNORMAL HIGH (ref 0.00–11.55)
T -- p-tau181: 2.69 pg/mL — ABNORMAL HIGH (ref 0.00–0.97)

## 2023-05-30 LAB — VITAMIN B12: Vitamin B-12: 234 pg/mL (ref 232–1245)

## 2023-05-30 MED ORDER — VITAMIN B-12 1000 MCG PO TABS: 1000.0000 ug | ORAL_TABLET | Freq: Every day | ORAL | 4 refills | Status: AC

## 2023-06-06 ENCOUNTER — Other Ambulatory Visit: Payer: Self-pay | Admitting: Family Medicine

## 2023-07-01 IMAGING — NM NM PARATHYROID W/ SPECT
4 series · 14 of 14 positions shown · non-contrast
Comparison: None Available.

CLINICAL DATA: Primary hyperparathyroidism. History of total
thyroidectomy in [REDACTED] for thyroid goiter. Elevated PTH and
calcium levels.

EXAM:
NM PARATHYROID SCINTIGRAPHY AND SPECT IMAGING
TECHNIQUE: Following intravenous administration of radiopharmaceutical, early
and 2-hour delayed planar images were obtained in the anterior
projection. Delayed triplanar SPECT images were also obtained at 2
hours.
RADIOPHARMACEUTICALS:  24.5 mCi 8c-44m Sestamibi IV

[Series 1: 15 min ant · 4.14mm/px · 1 of 1 slices shown]
[im 1/1]
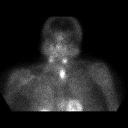

[Series 1: 2 hr ant · 2.07mm/px · 1 of 1 slices shown]
[im 1/1]
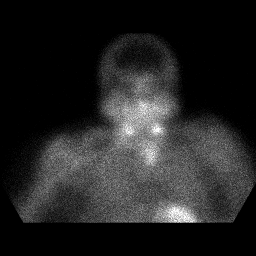

[Series 2: spect parathyroid · 4.14mm/px · 6 of 64 frames shown]
[frame 6/64  full-range]
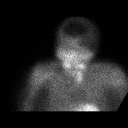
[frame 16/64  full-range]
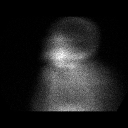
[frame 27/64  full-range]
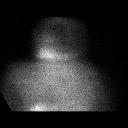
[frame 38/64  full-range]
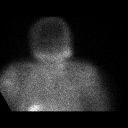
[frame 48/64  full-range]
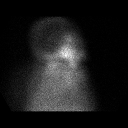
[frame 59/64  full-range]
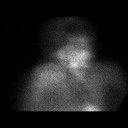

[Series 2: wbr_bone spect parathyroid · 4.1mm · 4.14mm/px · 6 of 128 frames shown]
[frame 11/128]
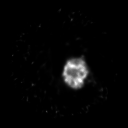
[frame 32/128]
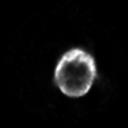
[frame 54/128]
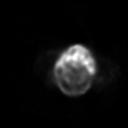
[frame 75/128]
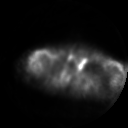
[frame 96/128]
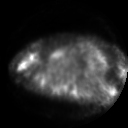
[frame 118/128]
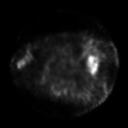

[14 of 14 positions shown; findings below may reference images not displayed]

FINDINGS: There is a large focus of increased radiotracer uptake within the
left side of the thyroid bed which shows persistent uptake on the 2
hour delayed imaging. This appears similar in size and location when
compared with the parathyroid scan from 09/30/2019.

On the CT neck performed 04/27/2022 there is a corresponding soft
tissue nodule which measures 3.6 x 1.7 by 3.9 cm, image 82/11 and
image 67/3.
IMPRESSION: 1. Large focus of increased radiotracer uptake within the left side
of the thyroid gland shows persistent radiotracer uptake on the
delayed 2 hour imaging. Imaging findings are strongly suggestive of
a large parathyroid adenoma. There is a corresponding area of
increased soft tissue within the left thyroid bed on recent CT of
the neck, which in a patient who is status post total thyroidectomy
(and presumed to have no residual functioning thyroid tissue) may
represent a large parathyroid adenoma.

## 2023-07-06 ENCOUNTER — Other Ambulatory Visit: Payer: Self-pay | Admitting: Nephrology

## 2023-07-06 ENCOUNTER — Ambulatory Visit
Admission: RE | Admit: 2023-07-06 | Discharge: 2023-07-06 | Disposition: A | Discharge status: Home / Self Care | Payer: PPO | Source: Ambulatory Visit | Attending: Nephrology | Admitting: Nephrology

## 2023-07-06 DIAGNOSIS — R059 Cough, unspecified: Secondary | ICD-10-CM

## 2023-07-10 ENCOUNTER — Other Ambulatory Visit: Payer: Self-pay | Admitting: Family Medicine

## 2023-07-11 ENCOUNTER — Telehealth: Payer: Self-pay | Admitting: Family Medicine

## 2023-07-11 DIAGNOSIS — Z111 Encounter for screening for respiratory tuberculosis: Secondary | ICD-10-CM

## 2023-07-11 NOTE — Telephone Encounter (Signed)
Ok to place order for Quantiferon gold test for TB screening

## 2023-07-11 NOTE — Telephone Encounter (Signed)
Spoke with the patient's daughter and scheduled a lab appt for 7/30.

## 2023-07-11 NOTE — Telephone Encounter (Signed)
Pt daughter is calling pt will be [facility] and need tb blood test next week. Please advise

## 2023-07-12 DIAGNOSIS — Z0279 Encounter for issue of other medical certificate: Secondary | ICD-10-CM

## 2023-07-13 NOTE — Telephone Encounter (Signed)
Forms and FL-2 from [facility] were received via fax.  PCP completed the forms, these were faxed to [phone] attn: Belenda Cruise and placed in the area to be scanned.  Spoke with the patient's daughter, informed her of this and PCP charged a $29 fee.

## 2023-07-17 ENCOUNTER — Telehealth: Payer: Self-pay | Admitting: Family Medicine

## 2023-07-17 NOTE — Telephone Encounter (Signed)
Left a detailed on [name]'s voicemail stating the fax was received sideways and requested she re-fax the FL-2 form and this can be sent to PCP

## 2023-07-17 NOTE — Telephone Encounter (Signed)
[name] called to say she received the [facility] via fax, but it came in sideways, so part of it was cut off.   Please resend fax to: [phone]

## 2023-07-18 ENCOUNTER — Other Ambulatory Visit: Payer: PPO

## 2023-07-18 DIAGNOSIS — Z111 Encounter for screening for respiratory tuberculosis: Secondary | ICD-10-CM

## 2023-07-20 ENCOUNTER — Telehealth: Payer: Self-pay | Admitting: Family Medicine

## 2023-07-20 NOTE — Telephone Encounter (Signed)
[name] with [facility] is calling and would like tb blood test result to be fax to [phone]

## 2023-07-24 NOTE — Progress Notes (Signed)
TB test negative.

## 2023-07-24 NOTE — Telephone Encounter (Signed)
Results were faxed to [facility] at the number below.

## 2023-07-24 NOTE — Telephone Encounter (Signed)
Ok to send test

## 2024-02-05 ENCOUNTER — Encounter: Payer: Self-pay | Admitting: Family Medicine

## 2024-02-08 ENCOUNTER — Ambulatory Visit: Payer: PPO | Admitting: Adult Health

## 2024-02-08 ENCOUNTER — Ambulatory Visit
Admission: RE | Admit: 2024-02-08 | Discharge: 2024-02-08 | Disposition: A | Discharge status: Home / Self Care | Payer: PPO | Source: Ambulatory Visit | Attending: Adult Health

## 2024-02-08 VITALS — BP 130/60 | HR 56 | Temp 97.6°F | Ht 67.0 in | Wt 237.0 lb

## 2024-02-08 DIAGNOSIS — R7981 Abnormal blood-gas level: Secondary | ICD-10-CM

## 2024-02-08 DIAGNOSIS — J988 Other specified respiratory disorders: Secondary | ICD-10-CM

## 2024-02-08 MED ORDER — PREDNISONE 10 MG PO TABS: ORAL_TABLET | ORAL | 0 refills | Status: AC

## 2024-02-08 MED ORDER — DOXYCYCLINE HYCLATE 100 MG PO CAPS
100.0000 mg | ORAL_CAPSULE | Freq: Two times a day (BID) | ORAL | 0 refills | Status: AC
Start: 2024-02-08 — End: ?

## 2024-02-08 NOTE — Progress Notes (Signed)
Subjective:    Patient ID: [name], female    DOB: April 16, 1935, 89 y.o.   MRN: [id number]  Shortness of Breath   89 year old female who  has a past medical history of Asthma, COLONIC POLYPS, HX OF (11/06/2009), Cough, DYSPNEA ON EXERTION (05/06/2008), GERD (06/18/2007), Goiter, HYPERCHOLESTEROLEMIA, PURE (07/09/2007), HYPOTHYROIDISM (06/18/2007), Insomnia, NEPHROLITHIASIS, HX OF (08/07/2008), Obesity, OSTEOARTHRITIS (05/07/2008), Renal cyst, and RENAL INSUFFICIENCY (05/06/2008).  She is a patient of Dr. Casimiro Needle who I am seeing today. She is with her daughter today. At her assisted living facility PT is trying to get her up and walking more frequently but are concerned as her oxygen levels decrease to 88 when she is active. When she is at rest her o2 sat is usually in the 92% range.   She does have a history of asthma. She is a former smoker   She does report that over the last few months she has had a wet cough that seems to be getting worse. She does feel short of breath with activity but not at rest. She has noticed wheezing. Denies fevers, chills, or feeling acutely bad  She does have an PRN rescue inhaler.    Review of Systems  Respiratory:  Positive for shortness of breath.    See HPI   Past Medical History:  Diagnosis Date   Asthma    no problem in last year   COLONIC POLYPS, HX OF 11/06/2009   Cough    nonproductive    DYSPNEA ON EXERTION 05/06/2008   GERD 06/18/2007   Goiter    hx of 1973 removed   HYPERCHOLESTEROLEMIA, PURE 07/09/2007   HYPOTHYROIDISM 06/18/2007   Insomnia    NEPHROLITHIASIS, HX OF 08/07/2008   Obesity    OSTEOARTHRITIS 05/07/2008   Renal cyst    RENAL INSUFFICIENCY 05/06/2008    Social History   Socioeconomic History   Marital status: Widowed    Spouse name: Not on file   Number of children: 4   Years of education: Not on file   Highest education level: Bachelor's degree (e.g., BA, AB, BS)  Occupational History   Occupation: Retired   Tobacco Use   Smoking status: Former    Current packs/day: 0.00    Average packs/day: 0.3 packs/day for 6.0 years (1.5 ttl pk-yrs)    Types: Cigarettes    Start date: 12/19/1972    Quit date: 12/19/1978    Years since quitting: 45.1   Smokeless tobacco: Never  Vaping Use   Vaping status: Never Used  Substance and Sexual Activity   Alcohol use: Never   Drug use: No   Sexual activity: Not Currently  Other Topics Concern   Not on file  Social History Narrative   Not on file   Social Drivers of Health   Financial Resource Strain: Low Risk  (02/08/2024)   Overall Financial Resource Strain (CARDIA)    Difficulty of Paying Living Expenses: Not very hard  Food Insecurity: No Food Insecurity (02/08/2024)   Hunger Vital Sign    Worried About Running Out of Food in the Last Year: Never true    Ran Out of Food in the Last Year: Never true  Transportation Needs: No Transportation Needs (02/08/2024)   PRAPARE - Administrator, Civil Service (Medical): No    Lack of Transportation (Non-Medical): No  Physical Activity: Unknown (02/08/2024)   Exercise Vital Sign    Days of Exercise per Week: 0 days    Minutes of Exercise  per Session: Not on file  Stress: No Stress Concern Present (02/08/2024)   [employer] of Occupational Health - Occupational Stress Questionnaire    Feeling of Stress : Not at all  Social Connections: Unknown (02/08/2024)   Social Connection and Isolation Panel [NHANES]    Frequency of Communication with Friends and Family: Three times a week    Frequency of Social Gatherings with Friends and Family: Patient declined    Attends Religious Services: Patient declined    Active Member of Clubs or Organizations: No    Attends Engineer, structural: Not on file    Marital Status: Widowed  Intimate Partner Violence: Not At Risk (03/21/2022)   Humiliation, Afraid, Rape, and Kick questionnaire    Fear of Current or Ex-Partner: No    Emotionally Abused: No     Physically Abused: No    Sexually Abused: No    Past Surgical History:  Procedure Laterality Date   ABDOMINAL HYSTERECTOMY     partial   CARPAL TUNNEL RELEASE Right 12/31/2019   Procedure: CARPAL TUNNEL RELEASE ENDOSCOPIC;  Surgeon: Sheral Apley, MD;  Location: [facility];  Service: Orthopedics;  Laterality: Right;   CATARACT EXTRACTION Bilateral    JOINT REPLACEMENT     bilateral knees   KNEE ARTHROSCOPY Left    LITHOTRIPSY     NASAL SINUS SURGERY  1990   PARATHYROIDECTOMY Left 07/21/2022   Procedure: LEFT NECK EXPLORATION AND PARATHYROIDECTOMY;  Surgeon: Darnell Level, MD;  Location: [facility];  Service: General;  Laterality: Left;   PARATHYROIDECTOMY  05/2022   SINUS ENDO W/FUSION Right 11/06/2015   Procedure: ENDOSCOPIC SINUS SURGERY WITH NAVIGATION;  Surgeon: Suzanna Obey, MD;  Location: [facility] OR;  Service: ENT;  Laterality: Right;  Endoscopic sinus surgery with Fusion protocol, right frontal and ethmoid sinusotomy    TONSILLECTOMY     TOTAL KNEE ARTHROPLASTY      Family History  Problem Relation Age of Onset   Heart Problems Father    Multiple myeloma Other    Colon cancer Other    Hypertension Neg Hx        family   Cancer Neg Hx        colon ca , prostate ca    No Known Allergies  Current Outpatient Medications on File Prior to Visit  Medication Sig Dispense Refill   albuterol (VENTOLIN HFA) 108 (90 Base) MCG/ACT inhaler Inhale into the lungs.     atorvastatin (LIPITOR) 10 MG tablet TAKE ONE TABLET BY MOUTH EVERY EVENING 90 tablet 1   Cholecalciferol (VITAMIN D3) 50 MCG (2000 UT) capsule Take 2,000 Units by mouth at bedtime.     cyanocobalamin (VITAMIN B12) 1000 MCG tablet Take 1 tablet (1,000 mcg total) by mouth daily. 90 tablet 4   donepezil (ARICEPT) 5 MG tablet Take 1 tablet (5 mg total) by mouth at bedtime. 30 tablet 3   DULoxetine (CYMBALTA) 60 MG capsule 1 capsule daily 90 capsule 3   furosemide (LASIX) 80 MG tablet Take 80 mg by mouth 2 (two)  times daily.     levothyroxine (SYNTHROID) 150 MCG tablet TAKE 1 AND 1/2 TABLETS BY MOUTH ONCE WEEKLY BEFORE BREAKFAST ON MONDAY and TAKE ONE TABLET ONCE WEEKLY BEFORE BREAKFAST ON  TUESDAYS and TAKE ONE TABLET ONCE WEEKLY BEFORE BREAKFAST ON WEDNESDAYS and TAKE ONE TABLET ONCE WEEKLY BEFORE BREAKFAST ON THURSDAYS and TAKE ONE TABLET ONCE WEEKLY BEFORE BREAKFAST ON FRIDAYS and TAKE ONE TABLET ONCE WEEKLY BEFORE BREAKFAST ON SATURDAY and  TAKE ONE TABLET ONCE WEEKLY BEFORE BREAKFAST ON SUNDAYS 36 tablet 0   melatonin 1 MG TABS tablet Take 1 mg by mouth at bedtime. Take 2 tablets at night     metoprolol tartrate (LOPRESSOR) 25 MG tablet TAKE ONE TABLET BY MOUTH BEFORE BREAKFAST and TAKE ONE TABLET BY MOUTH EVERYDAY AT BEDTIME (Patient taking differently: Take 25 mg by mouth.) 180 tablet 1   montelukast (SINGULAIR) 10 MG tablet TAKE ONE TABLET BY MOUTH BEFORE BREAKFAST Needs appointment for further refills 90 tablet 0   MYRBETRIQ 25 MG TB24 tablet TAKE ONE TABLET BY MOUTH EVERY EVENING 90 tablet 1   nystatin (MYCOSTATIN/NYSTOP) powder Apply topically.     No current facility-administered medications on file prior to visit.    BP 130/60   Pulse (!) 56   Temp 97.6 F (36.4 C) (Oral)   Ht 5\' 7"  (1.702 m)   Wt 237 lb (107.5 kg)   SpO2 97%   BMI 37.12 kg/m       Objective:   Physical Exam Vitals reviewed.  Constitutional:      Appearance: She is well-developed.  Cardiovascular:     Rate and Rhythm: Normal rate and regular rhythm.     Pulses: Normal pulses.     Heart sounds: Normal heart sounds.  Pulmonary:     Effort: Pulmonary effort is normal.     Breath sounds: Examination of the right-upper field reveals wheezing. Examination of the left-upper field reveals wheezing. Examination of the right-middle field reveals wheezing. Examination of the left-middle field reveals wheezing. Examination of the left-lower field reveals rhonchi. Wheezing and rhonchi present.     Comments: Rhonchi  resolves with coughing  Skin:    General: Skin is warm and dry.  Neurological:     General: No focal deficit present.     Mental Status: She is alert and oriented to person, place, and time.  Psychiatric:        Mood and Affect: Mood normal.        Behavior: Behavior normal.        Thought Content: Thought content normal.        Judgment: Judgment normal.       Assessment & Plan:  1. Respiratory infection (Primary) - Possibly walking PNA vs viral respiratory infection vs asthma exacerbation.  - Will get xray and start on steroid taper and prednisone  - DG Chest 2 View; Future - doxycycline (VIBRAMYCIN) 100 MG capsule; Take 1 capsule (100 mg total) by mouth 2 (two) times daily.  Dispense: 14 capsule; Refill: 0 - predniSONE (DELTASONE) 10 MG tablet; 40 mg x 3 days, 20 mg x 3 days, 10 mg x 3 days  Dispense: 21 tablet; Refill: 0  2. Low oxygen saturation - O2 did drop to 82 % with short walk down hall way. She did not feel SOB at this time. O2 came back up to 97 % within 30 seconds of resting.  - Consider referral to Pulmonary  - DG Chest 2 View; Future - doxycycline (VIBRAMYCIN) 100 MG capsule; Take 1 capsule (100 mg total) by mouth 2 (two) times daily.  Dispense: 14 capsule; Refill: 0 - predniSONE (DELTASONE) 10 MG tablet; 40 mg x 3 days, 20 mg x 3 days, 10 mg x 3 days  Dispense: 21 tablet; Refill: 0  Shirline Frees, NP

## 2024-02-10 ENCOUNTER — Encounter: Payer: Self-pay | Admitting: Adult Health

## 2024-02-15 ENCOUNTER — Encounter: Payer: Self-pay | Admitting: Family Medicine

## 2024-02-15 NOTE — Telephone Encounter (Signed)
**Note De-identified Hilliary Jock Obfuscation** Please advise 

## 2024-02-16 ENCOUNTER — Encounter: Payer: Self-pay | Admitting: Adult Health

## 2024-02-16 NOTE — Telephone Encounter (Signed)
 Pt daughter notified that Rx results are not in and verbalized understanding.

## 2024-02-16 NOTE — Telephone Encounter (Signed)
 Ok no problem, the cxr is still not read

## 2024-05-01 DIAGNOSIS — E669 Obesity, unspecified: Secondary | ICD-10-CM | POA: Diagnosis not present

## 2024-05-01 DIAGNOSIS — N184 Chronic kidney disease, stage 4 (severe): Secondary | ICD-10-CM | POA: Diagnosis not present

## 2024-05-20 DIAGNOSIS — M62512 Muscle wasting and atrophy, not elsewhere classified, left shoulder: Secondary | ICD-10-CM | POA: Diagnosis not present

## 2024-05-20 DIAGNOSIS — R2681 Unsteadiness on feet: Secondary | ICD-10-CM | POA: Diagnosis not present

## 2024-05-20 DIAGNOSIS — N3946 Mixed incontinence: Secondary | ICD-10-CM | POA: Diagnosis not present

## 2024-05-20 DIAGNOSIS — R488 Other symbolic dysfunctions: Secondary | ICD-10-CM | POA: Diagnosis not present

## 2024-05-20 DIAGNOSIS — M62511 Muscle wasting and atrophy, not elsewhere classified, right shoulder: Secondary | ICD-10-CM | POA: Diagnosis not present

## 2024-05-21 DIAGNOSIS — R2681 Unsteadiness on feet: Secondary | ICD-10-CM | POA: Diagnosis not present

## 2024-05-21 DIAGNOSIS — R488 Other symbolic dysfunctions: Secondary | ICD-10-CM | POA: Diagnosis not present

## 2024-05-21 DIAGNOSIS — M62511 Muscle wasting and atrophy, not elsewhere classified, right shoulder: Secondary | ICD-10-CM | POA: Diagnosis not present

## 2024-05-21 DIAGNOSIS — N3946 Mixed incontinence: Secondary | ICD-10-CM | POA: Diagnosis not present

## 2024-05-21 DIAGNOSIS — M62512 Muscle wasting and atrophy, not elsewhere classified, left shoulder: Secondary | ICD-10-CM | POA: Diagnosis not present

## 2024-05-23 DIAGNOSIS — M62512 Muscle wasting and atrophy, not elsewhere classified, left shoulder: Secondary | ICD-10-CM | POA: Diagnosis not present

## 2024-05-23 DIAGNOSIS — N3946 Mixed incontinence: Secondary | ICD-10-CM | POA: Diagnosis not present

## 2024-05-23 DIAGNOSIS — M62511 Muscle wasting and atrophy, not elsewhere classified, right shoulder: Secondary | ICD-10-CM | POA: Diagnosis not present

## 2024-05-23 DIAGNOSIS — R488 Other symbolic dysfunctions: Secondary | ICD-10-CM | POA: Diagnosis not present

## 2024-05-23 DIAGNOSIS — R2681 Unsteadiness on feet: Secondary | ICD-10-CM | POA: Diagnosis not present

## 2024-05-27 DIAGNOSIS — M62512 Muscle wasting and atrophy, not elsewhere classified, left shoulder: Secondary | ICD-10-CM | POA: Diagnosis not present

## 2024-05-27 DIAGNOSIS — R488 Other symbolic dysfunctions: Secondary | ICD-10-CM | POA: Diagnosis not present

## 2024-05-27 DIAGNOSIS — M62511 Muscle wasting and atrophy, not elsewhere classified, right shoulder: Secondary | ICD-10-CM | POA: Diagnosis not present

## 2024-05-27 DIAGNOSIS — N3946 Mixed incontinence: Secondary | ICD-10-CM | POA: Diagnosis not present

## 2024-05-27 DIAGNOSIS — R2681 Unsteadiness on feet: Secondary | ICD-10-CM | POA: Diagnosis not present

## 2024-05-28 DIAGNOSIS — M62512 Muscle wasting and atrophy, not elsewhere classified, left shoulder: Secondary | ICD-10-CM | POA: Diagnosis not present

## 2024-05-28 DIAGNOSIS — R488 Other symbolic dysfunctions: Secondary | ICD-10-CM | POA: Diagnosis not present

## 2024-05-28 DIAGNOSIS — N3946 Mixed incontinence: Secondary | ICD-10-CM | POA: Diagnosis not present

## 2024-05-28 DIAGNOSIS — R2681 Unsteadiness on feet: Secondary | ICD-10-CM | POA: Diagnosis not present

## 2024-05-28 DIAGNOSIS — M62511 Muscle wasting and atrophy, not elsewhere classified, right shoulder: Secondary | ICD-10-CM | POA: Diagnosis not present

## 2024-05-29 ENCOUNTER — Telehealth: Payer: Self-pay | Admitting: Neurology

## 2024-05-29 DIAGNOSIS — R2681 Unsteadiness on feet: Secondary | ICD-10-CM | POA: Diagnosis not present

## 2024-05-29 DIAGNOSIS — M62512 Muscle wasting and atrophy, not elsewhere classified, left shoulder: Secondary | ICD-10-CM | POA: Diagnosis not present

## 2024-05-29 DIAGNOSIS — M62511 Muscle wasting and atrophy, not elsewhere classified, right shoulder: Secondary | ICD-10-CM | POA: Diagnosis not present

## 2024-05-29 DIAGNOSIS — R488 Other symbolic dysfunctions: Secondary | ICD-10-CM | POA: Diagnosis not present

## 2024-05-29 DIAGNOSIS — N3946 Mixed incontinence: Secondary | ICD-10-CM | POA: Diagnosis not present

## 2024-05-29 NOTE — Telephone Encounter (Signed)
 Patient's daughter called reschedule appointment due to patient has the flu.Aaron Aas

## 2024-05-30 ENCOUNTER — Ambulatory Visit: Payer: PPO | Admitting: Neurology

## 2024-05-31 DIAGNOSIS — M79675 Pain in left toe(s): Secondary | ICD-10-CM | POA: Diagnosis not present

## 2024-05-31 DIAGNOSIS — B351 Tinea unguium: Secondary | ICD-10-CM | POA: Diagnosis not present

## 2024-05-31 DIAGNOSIS — E669 Obesity, unspecified: Secondary | ICD-10-CM | POA: Diagnosis not present

## 2024-05-31 DIAGNOSIS — N184 Chronic kidney disease, stage 4 (severe): Secondary | ICD-10-CM | POA: Diagnosis not present

## 2024-05-31 DIAGNOSIS — M2041 Other hammer toe(s) (acquired), right foot: Secondary | ICD-10-CM | POA: Diagnosis not present

## 2024-05-31 DIAGNOSIS — I739 Peripheral vascular disease, unspecified: Secondary | ICD-10-CM | POA: Diagnosis not present

## 2024-06-03 DIAGNOSIS — R488 Other symbolic dysfunctions: Secondary | ICD-10-CM | POA: Diagnosis not present

## 2024-06-03 DIAGNOSIS — M62512 Muscle wasting and atrophy, not elsewhere classified, left shoulder: Secondary | ICD-10-CM | POA: Diagnosis not present

## 2024-06-03 DIAGNOSIS — N3946 Mixed incontinence: Secondary | ICD-10-CM | POA: Diagnosis not present

## 2024-06-03 DIAGNOSIS — M62511 Muscle wasting and atrophy, not elsewhere classified, right shoulder: Secondary | ICD-10-CM | POA: Diagnosis not present

## 2024-06-03 DIAGNOSIS — R2681 Unsteadiness on feet: Secondary | ICD-10-CM | POA: Diagnosis not present

## 2024-06-04 DIAGNOSIS — M62512 Muscle wasting and atrophy, not elsewhere classified, left shoulder: Secondary | ICD-10-CM | POA: Diagnosis not present

## 2024-06-04 DIAGNOSIS — M62511 Muscle wasting and atrophy, not elsewhere classified, right shoulder: Secondary | ICD-10-CM | POA: Diagnosis not present

## 2024-06-04 DIAGNOSIS — N3946 Mixed incontinence: Secondary | ICD-10-CM | POA: Diagnosis not present

## 2024-06-04 DIAGNOSIS — R2681 Unsteadiness on feet: Secondary | ICD-10-CM | POA: Diagnosis not present

## 2024-06-04 DIAGNOSIS — R488 Other symbolic dysfunctions: Secondary | ICD-10-CM | POA: Diagnosis not present

## 2024-06-05 DIAGNOSIS — M62511 Muscle wasting and atrophy, not elsewhere classified, right shoulder: Secondary | ICD-10-CM | POA: Diagnosis not present

## 2024-06-05 DIAGNOSIS — R488 Other symbolic dysfunctions: Secondary | ICD-10-CM | POA: Diagnosis not present

## 2024-06-05 DIAGNOSIS — M62512 Muscle wasting and atrophy, not elsewhere classified, left shoulder: Secondary | ICD-10-CM | POA: Diagnosis not present

## 2024-06-05 DIAGNOSIS — R2681 Unsteadiness on feet: Secondary | ICD-10-CM | POA: Diagnosis not present

## 2024-06-05 DIAGNOSIS — N3946 Mixed incontinence: Secondary | ICD-10-CM | POA: Diagnosis not present

## 2024-06-10 DIAGNOSIS — N3946 Mixed incontinence: Secondary | ICD-10-CM | POA: Diagnosis not present

## 2024-06-10 DIAGNOSIS — R488 Other symbolic dysfunctions: Secondary | ICD-10-CM | POA: Diagnosis not present

## 2024-06-10 DIAGNOSIS — R2681 Unsteadiness on feet: Secondary | ICD-10-CM | POA: Diagnosis not present

## 2024-06-10 DIAGNOSIS — M62511 Muscle wasting and atrophy, not elsewhere classified, right shoulder: Secondary | ICD-10-CM | POA: Diagnosis not present

## 2024-06-10 DIAGNOSIS — M62512 Muscle wasting and atrophy, not elsewhere classified, left shoulder: Secondary | ICD-10-CM | POA: Diagnosis not present

## 2024-06-11 DIAGNOSIS — N3946 Mixed incontinence: Secondary | ICD-10-CM | POA: Diagnosis not present

## 2024-06-11 DIAGNOSIS — M62511 Muscle wasting and atrophy, not elsewhere classified, right shoulder: Secondary | ICD-10-CM | POA: Diagnosis not present

## 2024-06-11 DIAGNOSIS — R2681 Unsteadiness on feet: Secondary | ICD-10-CM | POA: Diagnosis not present

## 2024-06-11 DIAGNOSIS — R488 Other symbolic dysfunctions: Secondary | ICD-10-CM | POA: Diagnosis not present

## 2024-06-11 DIAGNOSIS — M62512 Muscle wasting and atrophy, not elsewhere classified, left shoulder: Secondary | ICD-10-CM | POA: Diagnosis not present

## 2024-06-12 DIAGNOSIS — M62512 Muscle wasting and atrophy, not elsewhere classified, left shoulder: Secondary | ICD-10-CM | POA: Diagnosis not present

## 2024-06-12 DIAGNOSIS — M62511 Muscle wasting and atrophy, not elsewhere classified, right shoulder: Secondary | ICD-10-CM | POA: Diagnosis not present

## 2024-06-12 DIAGNOSIS — N3946 Mixed incontinence: Secondary | ICD-10-CM | POA: Diagnosis not present

## 2024-06-12 DIAGNOSIS — R2681 Unsteadiness on feet: Secondary | ICD-10-CM | POA: Diagnosis not present

## 2024-06-12 DIAGNOSIS — R488 Other symbolic dysfunctions: Secondary | ICD-10-CM | POA: Diagnosis not present

## 2024-06-17 DIAGNOSIS — M62512 Muscle wasting and atrophy, not elsewhere classified, left shoulder: Secondary | ICD-10-CM | POA: Diagnosis not present

## 2024-06-17 DIAGNOSIS — R488 Other symbolic dysfunctions: Secondary | ICD-10-CM | POA: Diagnosis not present

## 2024-06-17 DIAGNOSIS — N3946 Mixed incontinence: Secondary | ICD-10-CM | POA: Diagnosis not present

## 2024-06-17 DIAGNOSIS — M62511 Muscle wasting and atrophy, not elsewhere classified, right shoulder: Secondary | ICD-10-CM | POA: Diagnosis not present

## 2024-06-17 DIAGNOSIS — R2681 Unsteadiness on feet: Secondary | ICD-10-CM | POA: Diagnosis not present

## 2024-06-19 DIAGNOSIS — N3946 Mixed incontinence: Secondary | ICD-10-CM | POA: Diagnosis not present

## 2024-06-19 DIAGNOSIS — M62511 Muscle wasting and atrophy, not elsewhere classified, right shoulder: Secondary | ICD-10-CM | POA: Diagnosis not present

## 2024-06-19 DIAGNOSIS — M62512 Muscle wasting and atrophy, not elsewhere classified, left shoulder: Secondary | ICD-10-CM | POA: Diagnosis not present

## 2024-06-19 DIAGNOSIS — R488 Other symbolic dysfunctions: Secondary | ICD-10-CM | POA: Diagnosis not present

## 2024-06-19 DIAGNOSIS — R2681 Unsteadiness on feet: Secondary | ICD-10-CM | POA: Diagnosis not present

## 2024-06-20 DIAGNOSIS — M62511 Muscle wasting and atrophy, not elsewhere classified, right shoulder: Secondary | ICD-10-CM | POA: Diagnosis not present

## 2024-06-20 DIAGNOSIS — M62512 Muscle wasting and atrophy, not elsewhere classified, left shoulder: Secondary | ICD-10-CM | POA: Diagnosis not present

## 2024-06-20 DIAGNOSIS — R2681 Unsteadiness on feet: Secondary | ICD-10-CM | POA: Diagnosis not present

## 2024-06-20 DIAGNOSIS — N3946 Mixed incontinence: Secondary | ICD-10-CM | POA: Diagnosis not present

## 2024-06-20 DIAGNOSIS — R488 Other symbolic dysfunctions: Secondary | ICD-10-CM | POA: Diagnosis not present

## 2024-06-24 DIAGNOSIS — R488 Other symbolic dysfunctions: Secondary | ICD-10-CM | POA: Diagnosis not present

## 2024-06-24 DIAGNOSIS — M62512 Muscle wasting and atrophy, not elsewhere classified, left shoulder: Secondary | ICD-10-CM | POA: Diagnosis not present

## 2024-06-24 DIAGNOSIS — M62511 Muscle wasting and atrophy, not elsewhere classified, right shoulder: Secondary | ICD-10-CM | POA: Diagnosis not present

## 2024-06-24 DIAGNOSIS — R2681 Unsteadiness on feet: Secondary | ICD-10-CM | POA: Diagnosis not present

## 2024-06-24 DIAGNOSIS — N3946 Mixed incontinence: Secondary | ICD-10-CM | POA: Diagnosis not present

## 2024-06-26 DIAGNOSIS — R2681 Unsteadiness on feet: Secondary | ICD-10-CM | POA: Diagnosis not present

## 2024-06-26 DIAGNOSIS — R488 Other symbolic dysfunctions: Secondary | ICD-10-CM | POA: Diagnosis not present

## 2024-06-26 DIAGNOSIS — M62511 Muscle wasting and atrophy, not elsewhere classified, right shoulder: Secondary | ICD-10-CM | POA: Diagnosis not present

## 2024-06-26 DIAGNOSIS — N3946 Mixed incontinence: Secondary | ICD-10-CM | POA: Diagnosis not present

## 2024-06-26 DIAGNOSIS — M62512 Muscle wasting and atrophy, not elsewhere classified, left shoulder: Secondary | ICD-10-CM | POA: Diagnosis not present

## 2024-07-02 DIAGNOSIS — M62512 Muscle wasting and atrophy, not elsewhere classified, left shoulder: Secondary | ICD-10-CM | POA: Diagnosis not present

## 2024-07-02 DIAGNOSIS — M62511 Muscle wasting and atrophy, not elsewhere classified, right shoulder: Secondary | ICD-10-CM | POA: Diagnosis not present

## 2024-07-02 DIAGNOSIS — R488 Other symbolic dysfunctions: Secondary | ICD-10-CM | POA: Diagnosis not present

## 2024-07-02 DIAGNOSIS — R2681 Unsteadiness on feet: Secondary | ICD-10-CM | POA: Diagnosis not present

## 2024-07-02 DIAGNOSIS — N3946 Mixed incontinence: Secondary | ICD-10-CM | POA: Diagnosis not present

## 2024-07-03 DIAGNOSIS — R488 Other symbolic dysfunctions: Secondary | ICD-10-CM | POA: Diagnosis not present

## 2024-07-03 DIAGNOSIS — M62511 Muscle wasting and atrophy, not elsewhere classified, right shoulder: Secondary | ICD-10-CM | POA: Diagnosis not present

## 2024-07-03 DIAGNOSIS — N3946 Mixed incontinence: Secondary | ICD-10-CM | POA: Diagnosis not present

## 2024-07-03 DIAGNOSIS — M62512 Muscle wasting and atrophy, not elsewhere classified, left shoulder: Secondary | ICD-10-CM | POA: Diagnosis not present

## 2024-07-03 DIAGNOSIS — R2681 Unsteadiness on feet: Secondary | ICD-10-CM | POA: Diagnosis not present

## 2024-07-09 DIAGNOSIS — R059 Cough, unspecified: Secondary | ICD-10-CM | POA: Diagnosis not present

## 2024-07-09 DIAGNOSIS — R062 Wheezing: Secondary | ICD-10-CM | POA: Diagnosis not present

## 2024-07-10 DIAGNOSIS — M62512 Muscle wasting and atrophy, not elsewhere classified, left shoulder: Secondary | ICD-10-CM | POA: Diagnosis not present

## 2024-07-10 DIAGNOSIS — R2681 Unsteadiness on feet: Secondary | ICD-10-CM | POA: Diagnosis not present

## 2024-07-10 DIAGNOSIS — N3946 Mixed incontinence: Secondary | ICD-10-CM | POA: Diagnosis not present

## 2024-07-10 DIAGNOSIS — M62511 Muscle wasting and atrophy, not elsewhere classified, right shoulder: Secondary | ICD-10-CM | POA: Diagnosis not present

## 2024-07-10 DIAGNOSIS — R488 Other symbolic dysfunctions: Secondary | ICD-10-CM | POA: Diagnosis not present

## 2024-07-11 DIAGNOSIS — M62512 Muscle wasting and atrophy, not elsewhere classified, left shoulder: Secondary | ICD-10-CM | POA: Diagnosis not present

## 2024-07-11 DIAGNOSIS — N3946 Mixed incontinence: Secondary | ICD-10-CM | POA: Diagnosis not present

## 2024-07-11 DIAGNOSIS — R488 Other symbolic dysfunctions: Secondary | ICD-10-CM | POA: Diagnosis not present

## 2024-07-11 DIAGNOSIS — R2681 Unsteadiness on feet: Secondary | ICD-10-CM | POA: Diagnosis not present

## 2024-07-11 DIAGNOSIS — M62511 Muscle wasting and atrophy, not elsewhere classified, right shoulder: Secondary | ICD-10-CM | POA: Diagnosis not present

## 2024-07-16 DIAGNOSIS — R488 Other symbolic dysfunctions: Secondary | ICD-10-CM | POA: Diagnosis not present

## 2024-07-16 DIAGNOSIS — N3946 Mixed incontinence: Secondary | ICD-10-CM | POA: Diagnosis not present

## 2024-07-16 DIAGNOSIS — R2681 Unsteadiness on feet: Secondary | ICD-10-CM | POA: Diagnosis not present

## 2024-07-16 DIAGNOSIS — E669 Obesity, unspecified: Secondary | ICD-10-CM | POA: Diagnosis not present

## 2024-07-16 DIAGNOSIS — E785 Hyperlipidemia, unspecified: Secondary | ICD-10-CM | POA: Diagnosis not present

## 2024-07-16 DIAGNOSIS — M62511 Muscle wasting and atrophy, not elsewhere classified, right shoulder: Secondary | ICD-10-CM | POA: Diagnosis not present

## 2024-07-16 DIAGNOSIS — M62512 Muscle wasting and atrophy, not elsewhere classified, left shoulder: Secondary | ICD-10-CM | POA: Diagnosis not present

## 2024-07-17 DIAGNOSIS — M62511 Muscle wasting and atrophy, not elsewhere classified, right shoulder: Secondary | ICD-10-CM | POA: Diagnosis not present

## 2024-07-17 DIAGNOSIS — N3946 Mixed incontinence: Secondary | ICD-10-CM | POA: Diagnosis not present

## 2024-07-17 DIAGNOSIS — M62512 Muscle wasting and atrophy, not elsewhere classified, left shoulder: Secondary | ICD-10-CM | POA: Diagnosis not present

## 2024-07-17 DIAGNOSIS — R488 Other symbolic dysfunctions: Secondary | ICD-10-CM | POA: Diagnosis not present

## 2024-07-17 DIAGNOSIS — R2681 Unsteadiness on feet: Secondary | ICD-10-CM | POA: Diagnosis not present

## 2024-07-22 DIAGNOSIS — M62511 Muscle wasting and atrophy, not elsewhere classified, right shoulder: Secondary | ICD-10-CM | POA: Diagnosis not present

## 2024-07-22 DIAGNOSIS — M62512 Muscle wasting and atrophy, not elsewhere classified, left shoulder: Secondary | ICD-10-CM | POA: Diagnosis not present

## 2024-07-22 DIAGNOSIS — R488 Other symbolic dysfunctions: Secondary | ICD-10-CM | POA: Diagnosis not present

## 2024-07-22 DIAGNOSIS — R2681 Unsteadiness on feet: Secondary | ICD-10-CM | POA: Diagnosis not present

## 2024-07-22 DIAGNOSIS — N3946 Mixed incontinence: Secondary | ICD-10-CM | POA: Diagnosis not present

## 2024-07-23 DIAGNOSIS — R2681 Unsteadiness on feet: Secondary | ICD-10-CM | POA: Diagnosis not present

## 2024-07-23 DIAGNOSIS — M62511 Muscle wasting and atrophy, not elsewhere classified, right shoulder: Secondary | ICD-10-CM | POA: Diagnosis not present

## 2024-07-23 DIAGNOSIS — R488 Other symbolic dysfunctions: Secondary | ICD-10-CM | POA: Diagnosis not present

## 2024-07-23 DIAGNOSIS — M62512 Muscle wasting and atrophy, not elsewhere classified, left shoulder: Secondary | ICD-10-CM | POA: Diagnosis not present

## 2024-07-23 DIAGNOSIS — N3946 Mixed incontinence: Secondary | ICD-10-CM | POA: Diagnosis not present

## 2024-07-24 DIAGNOSIS — R059 Cough, unspecified: Secondary | ICD-10-CM | POA: Diagnosis not present

## 2024-07-24 DIAGNOSIS — R0602 Shortness of breath: Secondary | ICD-10-CM | POA: Diagnosis not present

## 2024-07-25 DIAGNOSIS — Z7182 Exercise counseling: Secondary | ICD-10-CM | POA: Diagnosis not present

## 2024-07-25 DIAGNOSIS — I130 Hypertensive heart and chronic kidney disease with heart failure and stage 1 through stage 4 chronic kidney disease, or unspecified chronic kidney disease: Secondary | ICD-10-CM | POA: Diagnosis not present

## 2024-07-25 DIAGNOSIS — E785 Hyperlipidemia, unspecified: Secondary | ICD-10-CM | POA: Diagnosis not present

## 2024-07-25 DIAGNOSIS — Z7141 Alcohol abuse counseling and surveillance of alcoholic: Secondary | ICD-10-CM | POA: Diagnosis not present

## 2024-07-25 DIAGNOSIS — E039 Hypothyroidism, unspecified: Secondary | ICD-10-CM | POA: Diagnosis not present

## 2024-07-25 DIAGNOSIS — N184 Chronic kidney disease, stage 4 (severe): Secondary | ICD-10-CM | POA: Diagnosis not present

## 2024-07-25 DIAGNOSIS — Z0001 Encounter for general adult medical examination with abnormal findings: Secondary | ICD-10-CM | POA: Diagnosis not present

## 2024-08-02 DIAGNOSIS — E039 Hypothyroidism, unspecified: Secondary | ICD-10-CM | POA: Diagnosis not present

## 2024-08-15 ENCOUNTER — Emergency Department (HOSPITAL_COMMUNITY)
Admission: EM | Admit: 2024-08-15 | Discharge: 2024-08-15 | Disposition: A | Discharge status: Rehabilitation Facility | Source: Skilled Nursing Facility | Attending: Emergency Medicine | Admitting: Emergency Medicine

## 2024-08-15 ENCOUNTER — Encounter (HOSPITAL_COMMUNITY): Payer: Self-pay

## 2024-08-15 ENCOUNTER — Emergency Department (HOSPITAL_COMMUNITY)

## 2024-08-15 DIAGNOSIS — S199XXA Unspecified injury of neck, initial encounter: Secondary | ICD-10-CM | POA: Diagnosis not present

## 2024-08-15 DIAGNOSIS — Z79899 Other long term (current) drug therapy: Secondary | ICD-10-CM | POA: Insufficient documentation

## 2024-08-15 DIAGNOSIS — R58 Hemorrhage, not elsewhere classified: Secondary | ICD-10-CM | POA: Diagnosis not present

## 2024-08-15 DIAGNOSIS — S61412A Laceration without foreign body of left hand, initial encounter: Secondary | ICD-10-CM | POA: Insufficient documentation

## 2024-08-15 DIAGNOSIS — S022XXB Fracture of nasal bones, initial encounter for open fracture: Secondary | ICD-10-CM | POA: Insufficient documentation

## 2024-08-15 DIAGNOSIS — S0993XA Unspecified injury of face, initial encounter: Secondary | ICD-10-CM | POA: Diagnosis not present

## 2024-08-15 DIAGNOSIS — S0121XA Laceration without foreign body of nose, initial encounter: Secondary | ICD-10-CM | POA: Insufficient documentation

## 2024-08-15 DIAGNOSIS — W19XXXA Unspecified fall, initial encounter: Secondary | ICD-10-CM | POA: Diagnosis not present

## 2024-08-15 DIAGNOSIS — I639 Cerebral infarction, unspecified: Secondary | ICD-10-CM | POA: Diagnosis not present

## 2024-08-15 DIAGNOSIS — S0990XA Unspecified injury of head, initial encounter: Secondary | ICD-10-CM | POA: Insufficient documentation

## 2024-08-15 MED ORDER — LIDOCAINE-EPINEPHRINE (PF) 2 %-1:200000 IJ SOLN
20.0000 mL | Freq: Once | INTRAMUSCULAR | Status: AC
  Administered 2024-08-15: 20 mL
  Filled 2024-08-15: qty 20

## 2024-08-15 NOTE — ED Triage Notes (Signed)
 Pt BIB [facility] from [facility] after unwitnessed fall this morning resulting in small laceration to bridge of nose. Pt unsure how she fell, but states no dizziness, lightheadedness, syncope.   162/palp 64 97%  CBG 117

## 2024-08-15 NOTE — Discharge Instructions (Signed)
 Please read and follow all provided instructions.  Your diagnoses today include:  1. Injury of head, initial encounter   2. Open fracture of nasal bone, initial encounter   3. Laceration of left hand without foreign body, initial encounter     Tests performed today include: CT scan of your head that did not show any serious injury. CT scan of your cervical spine did not show any serious injuries. CT scan of the bones of the face showed nasal bone fractures, no other significant fracture. Vital signs. See below for your results today.   Medications prescribed:  You may take Tylenol  650 mg by mouth every 6 hours as needed for pain.  Take any prescribed medications only as directed.  Home care instructions:  Follow any educational materials contained in this packet.  Please keep your wounds clean, wash several times a day with mild soap and water.  The sutures should become weak and be able to be gently plucked out in 1-2 weeks.  Follow-up instructions: Please follow-up with your primary care provider as needed for further evaluation of your symptoms.   Return instructions:  SEEK IMMEDIATE MEDICAL ATTENTION IF: There is confusion or drowsiness (although children frequently become drowsy after injury).  You cannot awaken the injured person.  You have more than one episode of vomiting.  You notice dizziness or unsteadiness which is getting worse, or inability to walk.  You have convulsions or unconsciousness.  You experience severe, persistent headaches not relieved by Tylenol . You cannot use arms or legs normally.  There are changes in pupil sizes. (This is the black center in the colored part of the eye)  There is clear or bloody discharge from the nose or ears.  You have change in speech, vision, swallowing, or understanding.  Localized weakness, numbness, tingling, or change in bowel or bladder control. You have any other emergent concerns.  Additional Information: You have  had a head injury which does not appear to require admission at this time.  Your vital signs today were: BP (!) 188/101 (BP Location: Right Arm)   Pulse 77   Temp 98 F (36.7 C) (Oral)   Resp 20   SpO2 100%  If your blood pressure (BP) was elevated above 135/85 this visit, please have this repeated by your doctor within one month. --------------

## 2024-08-15 NOTE — ED Provider Notes (Signed)
 [place] EMERGENCY DEPARTMENT AT [facility] Provider Note   CSN: [id number] Arrival date & time: 08/15/24  1211     Patient presents with: [name] [name] is a 89 y.o. female.   Patient with history of dementia, currently residing at [facility] --presents to the emergency department after an unwitnessed fall.  Patient unable to give history as to what happened today.  Per EMS report, patient had an unwitnessed fall this morning and sustained a laceration to the bridge of her nose, left hand.  Patient currently states that she feels well, no complaints.  No chest or abdominal pain.  No pain with movement of her arms or her legs.  No anticoagulation on patient's medication list.       Prior to Admission medications   Medication Sig Start Date End Date Taking? Authorizing Provider  albuterol  (VENTOLIN  HFA) 108 (90 Base) MCG/ACT inhaler Inhale into the lungs. 12/18/23   [provider]  atorvastatin  (LIPITOR) 10 MG tablet TAKE ONE TABLET BY MOUTH EVERY EVENING 02/01/23   Ozell Heron HERO, MD  Cholecalciferol (VITAMIN D3) 50 MCG (2000 UT) capsule Take 2,000 Units by mouth at bedtime.    [provider]  cyanocobalamin (VITAMIN B12) 1000 MCG tablet Take 1 tablet (1,000 mcg total) by mouth daily. 05/30/23 08/22/24  Camara, Amadou, MD  donepezil  (ARICEPT ) 5 MG tablet Take 1 tablet (5 mg total) by mouth at bedtime. 05/25/23   Camara, Amadou, MD  doxycycline  (VIBRAMYCIN ) 100 MG capsule Take 1 capsule (100 mg total) by mouth 2 (two) times daily. 02/08/24   Nafziger, Darleene, NP  DULoxetine  (CYMBALTA ) 60 MG capsule 1 capsule daily 05/25/23   Camara, Amadou, MD  furosemide  (LASIX ) 80 MG tablet Take 80 mg by mouth 2 (two) times daily. 01/30/24   [provider]  levothyroxine  (SYNTHROID ) 150 MCG tablet TAKE 1 AND 1/2 TABLETS BY MOUTH ONCE WEEKLY BEFORE BREAKFAST ON MONDAY and TAKE ONE TABLET ONCE WEEKLY BEFORE BREAKFAST ON  TUESDAYS and TAKE ONE TABLET ONCE WEEKLY  BEFORE BREAKFAST ON WEDNESDAYS and TAKE ONE TABLET ONCE WEEKLY BEFORE BREAKFAST ON THURSDAYS and TAKE ONE TABLET ONCE WEEKLY BEFORE BREAKFAST ON FRIDAYS and TAKE ONE TABLET ONCE WEEKLY BEFORE BREAKFAST ON SATURDAY and TAKE ONE TABLET ONCE WEEKLY BEFORE BREAKFAST ON SUNDAYS 07/10/23   Ozell Heron HERO, MD  melatonin 1 MG TABS tablet Take 1 mg by mouth at bedtime. Take 2 tablets at night    [provider]  metoprolol  tartrate (LOPRESSOR ) 25 MG tablet TAKE ONE TABLET BY MOUTH BEFORE BREAKFAST and TAKE ONE TABLET BY MOUTH EVERYDAY AT BEDTIME Patient taking differently: Take 25 mg by mouth. 08/04/22   Ozell Heron HERO, MD  montelukast  (SINGULAIR ) 10 MG tablet TAKE ONE TABLET BY MOUTH BEFORE BREAKFAST Needs appointment for further refills 06/06/23   Ozell Heron HERO, MD  MYRBETRIQ  25 MG TB24 tablet TAKE ONE TABLET BY MOUTH EVERY EVENING 02/01/23   Ozell Heron HERO, MD  nystatin (MYCOSTATIN/NYSTOP) powder Apply topically. 01/08/24   [provider]  predniSONE  (DELTASONE ) 10 MG tablet 40 mg x 3 days, 20 mg x 3 days, 10 mg x 3 days 02/08/24   Nafziger, Cory, NP    Allergies: Patient has no known allergies.    Review of Systems  Updated Vital Signs BP (!) 172/89 (BP Location: Right Arm)   Pulse (!) 54   Temp 98.7 F (37.1 C) (Oral)   Resp 18   SpO2 100%   Physical Exam Vitals  and nursing note reviewed.  Constitutional:      Appearance: She is well-developed.  HENT:     Head: Normocephalic. No raccoon eyes or Battle's sign.     Comments: There is a 1 cm nongaping laceration across the bridge of the nose, hemostatic.  I can open it slightly with manual traction.  Should be able to be closed with Dermabond.    Right Ear: Tympanic membrane, ear canal and external ear normal. No hemotympanum.     Left Ear: Tympanic membrane, ear canal and external ear normal. No hemotympanum.     Nose: Nose normal.     Mouth/Throat:     Mouth: Mucous membranes are moist.     Pharynx: Uvula  midline.  Eyes:     General: Lids are normal.     Extraocular Movements:     Right eye: No nystagmus.     Left eye: No nystagmus.     Conjunctiva/sclera: Conjunctivae normal.     Pupils: Pupils are equal, round, and reactive to light.     Comments: No visible hyphema noted  Neck:     Comments: Normal range of motion of cervical spine, actively, without pain. Cardiovascular:     Rate and Rhythm: Normal rate and regular rhythm.  Pulmonary:     Effort: Pulmonary effort is normal.     Breath sounds: Normal breath sounds.  Abdominal:     Palpations: Abdomen is soft.     Tenderness: There is no abdominal tenderness.     Comments: No abdominal tenderness to palpation.  Musculoskeletal:     Cervical back: Normal range of motion and neck supple. No tenderness or bony tenderness.     Thoracic back: No tenderness or bony tenderness.     Lumbar back: No tenderness or bony tenderness.     Comments: Patient lifts her arms up off of the bed and flexes hip and knees individually without any difficulty.  L hand, lateral hand flap laceration, skin flap retracts. Wound base clean. Mild oozing.   Skin:    General: Skin is warm and dry.  Neurological:     Mental Status: She is alert.     GCS: GCS eye subscore is 4. GCS verbal subscore is 5. GCS motor subscore is 6.     Cranial Nerves: No cranial nerve deficit.     Sensory: No sensory deficit.     Coordination: Coordination normal.     Comments: Patient awake and alert, she can tell me her name.  She jokes about not being able to find anything in her head on her upcoming CT.     (all labs ordered are listed, but only abnormal results are displayed) Labs Reviewed - No data to display  EKG: None  Radiology: CT Head Wo Contrast Result Date: 08/15/2024 CLINICAL DATA:  Head trauma, minor (Age >= 65y); Neck trauma (Age >= 65y) unwitnessed fall, lac to bridge of nose; Facial trauma, blunt unwitnessed fall, lac to bridge of nose EXAM: CT HEAD  WITHOUT CONTRAST CT MAXILLOFACIAL WITHOUT CONTRAST CT CERVICAL SPINE WITHOUT CONTRAST TECHNIQUE: Multidetector CT imaging of the head, cervical spine, and maxillofacial structures were performed using the standard protocol without intravenous contrast. Multiplanar CT image reconstructions of the cervical spine and maxillofacial structures were also generated. RADIATION DOSE REDUCTION: This exam was performed according to the departmental dose-optimization program which includes automated exposure control, adjustment of the mA and/or kV according to patient size and/or use of iterative reconstruction technique. COMPARISON:  None Available.  CT head and CT cervical spine October 5 23. FINDINGS: CT HEAD FINDINGS Brain: No evidence of acute infarction, hemorrhage, hydrocephalus, extra-axial collection or mass lesion/mass effect. Remote left cerebellar and left occipital infarcts. Patchy white matter hypodensities, compatible with chronic microvascular ischemic disease. Leg none a Vascular: No hyperdense vessel or unexpected calcification. Skull: No acute fracture. Sinuses/Orbits: Clear sinuses. No acute orbital findings. Other: Remote left cerebellar infarct. CT CERVICAL SPINE FINDINGS Alignment: Similar anterolisthesis of C3 on C4 and C4 on C5. No new traumatic malalignment. Skull base and vertebrae: No acute fracture.  Osteopenia. Soft tissues and spinal canal: No prevertebral fluid or swelling. No visible canal hematoma. Disc levels:  Similar severe multilevel degenerative change. Upper chest: Lung apices are clear. CT MAXILLOFACIAL FINDINGS Osseous: Suspect nondisplaced nasal bone fractures with slight deformity. Otherwise, no fracture or mandibular dislocation. No destructive process. Orbits: Negative. No traumatic or inflammatory finding. Sinuses: Moderate paranasal sinus mucosal thickening predominately involving the maxillary sinuses and right sphenoid sinus with areas of osteitis. Soft tissues: Negative.  IMPRESSION: 1. Suspect nondisplaced nasal bone fractures with slight deformity. Correlate with the presence or absence of point tenderness. 2. No evidence of acute abnormality intracranially or in the cervical spine. Electronically Signed   By: Gilmore GORMAN Molt M.D.   On: 08/15/2024 16:27   CT Maxillofacial Wo Contrast Result Date: 08/15/2024 CLINICAL DATA:  Head trauma, minor (Age >= 65y); Neck trauma (Age >= 65y) unwitnessed fall, lac to bridge of nose; Facial trauma, blunt unwitnessed fall, lac to bridge of nose EXAM: CT HEAD WITHOUT CONTRAST CT MAXILLOFACIAL WITHOUT CONTRAST CT CERVICAL SPINE WITHOUT CONTRAST TECHNIQUE: Multidetector CT imaging of the head, cervical spine, and maxillofacial structures were performed using the standard protocol without intravenous contrast. Multiplanar CT image reconstructions of the cervical spine and maxillofacial structures were also generated. RADIATION DOSE REDUCTION: This exam was performed according to the departmental dose-optimization program which includes automated exposure control, adjustment of the mA and/or kV according to patient size and/or use of iterative reconstruction technique. COMPARISON:  None Available. CT head and CT cervical spine October 5 23. FINDINGS: CT HEAD FINDINGS Brain: No evidence of acute infarction, hemorrhage, hydrocephalus, extra-axial collection or mass lesion/mass effect. Remote left cerebellar and left occipital infarcts. Patchy white matter hypodensities, compatible with chronic microvascular ischemic disease. Leg none a Vascular: No hyperdense vessel or unexpected calcification. Skull: No acute fracture. Sinuses/Orbits: Clear sinuses. No acute orbital findings. Other: Remote left cerebellar infarct. CT CERVICAL SPINE FINDINGS Alignment: Similar anterolisthesis of C3 on C4 and C4 on C5. No new traumatic malalignment. Skull base and vertebrae: No acute fracture.  Osteopenia. Soft tissues and spinal canal: No prevertebral fluid or  swelling. No visible canal hematoma. Disc levels:  Similar severe multilevel degenerative change. Upper chest: Lung apices are clear. CT MAXILLOFACIAL FINDINGS Osseous: Suspect nondisplaced nasal bone fractures with slight deformity. Otherwise, no fracture or mandibular dislocation. No destructive process. Orbits: Negative. No traumatic or inflammatory finding. Sinuses: Moderate paranasal sinus mucosal thickening predominately involving the maxillary sinuses and right sphenoid sinus with areas of osteitis. Soft tissues: Negative. IMPRESSION: 1. Suspect nondisplaced nasal bone fractures with slight deformity. Correlate with the presence or absence of point tenderness. 2. No evidence of acute abnormality intracranially or in the cervical spine. Electronically Signed   By: Gilmore GORMAN Molt M.D.   On: 08/15/2024 16:27   CT Cervical Spine Wo Contrast Result Date: 08/15/2024 CLINICAL DATA:  Head trauma, minor (Age >= 65y); Neck trauma (Age >= 65y) unwitnessed fall, lac to bridge  of nose; Facial trauma, blunt unwitnessed fall, lac to bridge of nose EXAM: CT HEAD WITHOUT CONTRAST CT MAXILLOFACIAL WITHOUT CONTRAST CT CERVICAL SPINE WITHOUT CONTRAST TECHNIQUE: Multidetector CT imaging of the head, cervical spine, and maxillofacial structures were performed using the standard protocol without intravenous contrast. Multiplanar CT image reconstructions of the cervical spine and maxillofacial structures were also generated. RADIATION DOSE REDUCTION: This exam was performed according to the departmental dose-optimization program which includes automated exposure control, adjustment of the mA and/or kV according to patient size and/or use of iterative reconstruction technique. COMPARISON:  None Available. CT head and CT cervical spine October 5 23. FINDINGS: CT HEAD FINDINGS Brain: No evidence of acute infarction, hemorrhage, hydrocephalus, extra-axial collection or mass lesion/mass effect. Remote left cerebellar and left  occipital infarcts. Patchy white matter hypodensities, compatible with chronic microvascular ischemic disease. Leg none a Vascular: No hyperdense vessel or unexpected calcification. Skull: No acute fracture. Sinuses/Orbits: Clear sinuses. No acute orbital findings. Other: Remote left cerebellar infarct. CT CERVICAL SPINE FINDINGS Alignment: Similar anterolisthesis of C3 on C4 and C4 on C5. No new traumatic malalignment. Skull base and vertebrae: No acute fracture.  Osteopenia. Soft tissues and spinal canal: No prevertebral fluid or swelling. No visible canal hematoma. Disc levels:  Similar severe multilevel degenerative change. Upper chest: Lung apices are clear. CT MAXILLOFACIAL FINDINGS Osseous: Suspect nondisplaced nasal bone fractures with slight deformity. Otherwise, no fracture or mandibular dislocation. No destructive process. Orbits: Negative. No traumatic or inflammatory finding. Sinuses: Moderate paranasal sinus mucosal thickening predominately involving the maxillary sinuses and right sphenoid sinus with areas of osteitis. Soft tissues: Negative. IMPRESSION: 1. Suspect nondisplaced nasal bone fractures with slight deformity. Correlate with the presence or absence of point tenderness. 2. No evidence of acute abnormality intracranially or in the cervical spine. Electronically Signed   By: Gilmore GORMAN Molt M.D.   On: 08/15/2024 16:27     .Laceration Repair  Date/Time: 08/15/2024 4:49 PM  Performed by: Desiderio Chew, PA-C Authorized by: Desiderio Chew, PA-C   Consent:    Consent obtained:  Verbal   Consent given by:  Healthcare agent and patient   Risks discussed:  Infection, pain and poor cosmetic result Universal protocol:    Patient identity confirmed:  Verbally with patient and provided demographic data Anesthesia:    Anesthesia method:  None Laceration details:    Location:  Face   Face location:  Nose   Length (cm):  1 Pre-procedure details:    Preparation:  Patient was prepped  and draped in usual sterile fashion and imaging obtained to evaluate for foreign bodies Exploration:    Imaging outcome: foreign body not noted     Wound exploration: wound explored through full range of motion     Contaminated: no   Treatment:    Area cleansed with:  Shur-Clens   Amount of cleaning:  Standard   Irrigation solution:  Sterile saline Skin repair:    Repair method:  Tissue adhesive Approximation:    Approximation:  Close Repair type:    Repair type:  Simple Post-procedure details:    Dressing:  Open (no dressing)   Procedure completion:  Tolerated well, no immediate complications .Laceration Repair  Date/Time: 08/15/2024 4:49 PM  Performed by: Desiderio Chew, PA-C Authorized by: Desiderio Chew, PA-C   Consent:    Consent obtained:  Verbal   Consent given by:  Patient   Risks discussed:  Infection and pain Universal protocol:    Patient identity confirmed:  Verbally with patient and provided  demographic data Anesthesia:    Anesthesia method:  Local infiltration   Local anesthetic:  Lidocaine  2% WITH epi Laceration details:    Location:  Hand   Hand location:  L hand, dorsum   Length (cm):  2 Pre-procedure details:    Preparation:  Patient was prepped and draped in usual sterile fashion Treatment:    Area cleansed with:  Saline   Amount of cleaning:  Standard Skin repair:    Repair method:  Sutures   Suture size:  5-0   Wound skin closure material used: vicryl.   Suture technique:  Simple interrupted   Number of sutures:  4 Approximation:    Approximation:  Close Repair type:    Repair type:  Simple Post-procedure details:    Dressing:  Open (no dressing)   Procedure completion:  Tolerated well, no immediate complications    Medications Ordered in the ED - No data to display  ED Course  Patient seen and examined. History obtained directly from patient, but history of present illness obtained only from EMS report.  Patient unable to state exactly  what happened.  She does have trauma to the bridge of her nose.  For that reason we will obtain imaging.  Labs/EKG: None ordered  Imaging: Ordered CT head, maxillofacial bones, cervical spine.  Medications/Fluids: Ordered: Dermabond.   Most recent vital signs reviewed and are as follows: BP (!) 172/89 (BP Location: Right Arm)   Pulse (!) 54   Temp 98.7 F (37.1 C) (Oral)   Resp 18   SpO2 100%   Initial impression: Fall, patient appears to be at baseline from what I can tell.  No extremity injuries, chest injury, abdominal injury suspected.  3:09 PM Spoke with daughter at bedside, awaiting CT imaging. I placed dermabond on laceration on nose. Will need stitch to wound on right hand.  4:51 PM Reassessment performed. Patient appears stable.  Tolerated wound repair as well.  Imaging personally visualized and interpreted including: CT head and cervical spine, agree negative for fracture or acute injury.  CT maxillofacial, agree nondisplaced nasal bone fractures.  This correlates with injury pattern.  Reviewed pertinent lab work and imaging with patient at bedside. Questions answered.   Most current vital signs reviewed and are as follows: BP (!) 188/101 (BP Location: Right Arm)   Pulse 77   Temp 98 F (36.7 C) (Oral)   Resp 20   SpO2 100%   Plan: Discharge to home.   Prescriptions written for: None  Other home care instructions discussed: OTC meds such as Tylenol  for pain, wound care  ED return instructions discussed: Patient was counseled on head injury precautions and symptoms that should indicate their return to the ED.  These include severe worsening headache, vision changes, confusion, loss of consciousness, trouble walking, nausea & vomiting, or weakness/tingling in extremities.    Follow-up instructions discussed: Patient encouraged to follow-up with their PCP as needed.        Clinical Course as of 08/15/24 1508  Thu Aug 15, 2024  [id number] 89 year old female with  dementia here after this fall.  Getting CT imaging of head and neck, face.  Likely discharge back to facility if no acute findings. [MB]    Clinical Course User Index [MB] Towana Ozell BROCKS, MD                                 Medical Decision Making Amount and/or Complexity  of Data Reviewed Radiology: ordered.  Risk Prescription drug management.   Patient with unwitnessed fall today.  She has at her baseline confirmed with family.  She sustained nasal bone fractures and a nongaping laceration over the bridge of the nose that was closed with Dermabond.  She has full range of motion of her hands and arms, did have a laceration to the lateral aspect of the left hand which was sutured.  Low concern for fracture.  Patient has done very well without decompensation during ED stay.     Final diagnoses:  Injury of head, initial encounter  Open fracture of nasal bone, initial encounter  Laceration of left hand without foreign body, initial encounter    ED Discharge Orders     None          Desiderio Chew, PA-C 08/15/24 1652    Towana Ozell BROCKS, MD 08/15/24 [phone]

## 2024-08-22 DIAGNOSIS — R296 Repeated falls: Secondary | ICD-10-CM | POA: Diagnosis not present

## 2024-08-22 DIAGNOSIS — S022XXD Fracture of nasal bones, subsequent encounter for fracture with routine healing: Secondary | ICD-10-CM | POA: Diagnosis not present

## 2024-08-22 DIAGNOSIS — S61412D Laceration without foreign body of left hand, subsequent encounter: Secondary | ICD-10-CM | POA: Diagnosis not present

## 2024-09-19 DIAGNOSIS — E785 Hyperlipidemia, unspecified: Secondary | ICD-10-CM | POA: Diagnosis not present

## 2024-09-19 DIAGNOSIS — F0390 Unspecified dementia without behavioral disturbance: Secondary | ICD-10-CM | POA: Diagnosis not present

## 2024-09-19 DIAGNOSIS — N3281 Overactive bladder: Secondary | ICD-10-CM | POA: Diagnosis not present

## 2024-09-19 DIAGNOSIS — I872 Venous insufficiency (chronic) (peripheral): Secondary | ICD-10-CM | POA: Diagnosis not present

## 2024-09-19 DIAGNOSIS — I10 Essential (primary) hypertension: Secondary | ICD-10-CM | POA: Diagnosis not present

## 2024-09-19 DIAGNOSIS — I509 Heart failure, unspecified: Secondary | ICD-10-CM | POA: Diagnosis not present

## 2024-09-19 DIAGNOSIS — N184 Chronic kidney disease, stage 4 (severe): Secondary | ICD-10-CM | POA: Diagnosis not present

## 2024-09-19 DIAGNOSIS — E039 Hypothyroidism, unspecified: Secondary | ICD-10-CM | POA: Diagnosis not present

## 2024-09-25 DIAGNOSIS — I509 Heart failure, unspecified: Secondary | ICD-10-CM | POA: Diagnosis not present

## 2024-09-25 DIAGNOSIS — I10 Essential (primary) hypertension: Secondary | ICD-10-CM | POA: Diagnosis not present

## 2024-09-26 DIAGNOSIS — E039 Hypothyroidism, unspecified: Secondary | ICD-10-CM | POA: Diagnosis not present

## 2024-09-26 DIAGNOSIS — R062 Wheezing: Secondary | ICD-10-CM | POA: Diagnosis not present

## 2024-09-26 DIAGNOSIS — N184 Chronic kidney disease, stage 4 (severe): Secondary | ICD-10-CM | POA: Diagnosis not present

## 2024-09-26 DIAGNOSIS — D519 Vitamin B12 deficiency anemia, unspecified: Secondary | ICD-10-CM | POA: Diagnosis not present

## 2024-09-26 DIAGNOSIS — I130 Hypertensive heart and chronic kidney disease with heart failure and stage 1 through stage 4 chronic kidney disease, or unspecified chronic kidney disease: Secondary | ICD-10-CM | POA: Diagnosis not present

## 2024-09-26 DIAGNOSIS — E559 Vitamin D deficiency, unspecified: Secondary | ICD-10-CM | POA: Diagnosis not present

## 2024-09-26 DIAGNOSIS — N3281 Overactive bladder: Secondary | ICD-10-CM | POA: Diagnosis not present

## 2024-09-26 DIAGNOSIS — G4700 Insomnia, unspecified: Secondary | ICD-10-CM | POA: Diagnosis not present

## 2024-09-26 DIAGNOSIS — R609 Edema, unspecified: Secondary | ICD-10-CM | POA: Diagnosis not present

## 2024-09-26 DIAGNOSIS — E785 Hyperlipidemia, unspecified: Secondary | ICD-10-CM | POA: Diagnosis not present

## 2024-09-26 DIAGNOSIS — I872 Venous insufficiency (chronic) (peripheral): Secondary | ICD-10-CM | POA: Diagnosis not present

## 2024-10-23 DIAGNOSIS — R4182 Altered mental status, unspecified: Secondary | ICD-10-CM | POA: Diagnosis not present

## 2024-11-21 DIAGNOSIS — G4700 Insomnia, unspecified: Secondary | ICD-10-CM | POA: Diagnosis not present

## 2024-11-21 DIAGNOSIS — N184 Chronic kidney disease, stage 4 (severe): Secondary | ICD-10-CM | POA: Diagnosis not present

## 2024-11-21 DIAGNOSIS — I872 Venous insufficiency (chronic) (peripheral): Secondary | ICD-10-CM | POA: Diagnosis not present

## 2024-11-21 DIAGNOSIS — E785 Hyperlipidemia, unspecified: Secondary | ICD-10-CM | POA: Diagnosis not present

## 2024-11-21 DIAGNOSIS — I130 Hypertensive heart and chronic kidney disease with heart failure and stage 1 through stage 4 chronic kidney disease, or unspecified chronic kidney disease: Secondary | ICD-10-CM | POA: Diagnosis not present

## 2024-11-21 DIAGNOSIS — E039 Hypothyroidism, unspecified: Secondary | ICD-10-CM | POA: Diagnosis not present

## 2024-11-21 DIAGNOSIS — E559 Vitamin D deficiency, unspecified: Secondary | ICD-10-CM | POA: Diagnosis not present

## 2024-11-21 DIAGNOSIS — R609 Edema, unspecified: Secondary | ICD-10-CM | POA: Diagnosis not present

## 2024-11-21 DIAGNOSIS — D519 Vitamin B12 deficiency anemia, unspecified: Secondary | ICD-10-CM | POA: Diagnosis not present

## 2024-11-21 DIAGNOSIS — J4540 Moderate persistent asthma, uncomplicated: Secondary | ICD-10-CM | POA: Diagnosis not present

## 2024-11-21 DIAGNOSIS — N3281 Overactive bladder: Secondary | ICD-10-CM | POA: Diagnosis not present

## 2024-11-25 DIAGNOSIS — N184 Chronic kidney disease, stage 4 (severe): Secondary | ICD-10-CM | POA: Diagnosis not present

## 2024-11-25 DIAGNOSIS — E039 Hypothyroidism, unspecified: Secondary | ICD-10-CM | POA: Diagnosis not present

## 2024-11-25 DIAGNOSIS — I872 Venous insufficiency (chronic) (peripheral): Secondary | ICD-10-CM | POA: Diagnosis not present

## 2024-11-25 DIAGNOSIS — I130 Hypertensive heart and chronic kidney disease with heart failure and stage 1 through stage 4 chronic kidney disease, or unspecified chronic kidney disease: Secondary | ICD-10-CM | POA: Diagnosis not present

## 2024-11-26 DIAGNOSIS — E039 Hypothyroidism, unspecified: Secondary | ICD-10-CM | POA: Diagnosis not present

## 2024-11-26 DIAGNOSIS — I872 Venous insufficiency (chronic) (peripheral): Secondary | ICD-10-CM | POA: Diagnosis not present

## 2024-11-26 DIAGNOSIS — N184 Chronic kidney disease, stage 4 (severe): Secondary | ICD-10-CM | POA: Diagnosis not present

## 2024-11-26 DIAGNOSIS — I130 Hypertensive heart and chronic kidney disease with heart failure and stage 1 through stage 4 chronic kidney disease, or unspecified chronic kidney disease: Secondary | ICD-10-CM | POA: Diagnosis not present

## 2025-01-20 ENCOUNTER — Ambulatory Visit: Admitting: Neurology

## 2025-01-20 ENCOUNTER — Telehealth: Payer: Self-pay | Admitting: Neurology

## 2025-01-20 NOTE — Telephone Encounter (Signed)
Office closed due to weather
